# Patient Record
Sex: Female | Born: 1940 | Race: White | Hispanic: No | Smoking: Never smoker
Health system: Southern US, Community
[De-identification: ages and names within clinical notes are randomized; demographics above are authoritative.]

## PROBLEM LIST (undated history)

## (undated) DIAGNOSIS — E559 Vitamin D deficiency, unspecified: Secondary | ICD-10-CM

## (undated) DIAGNOSIS — R011 Cardiac murmur, unspecified: Secondary | ICD-10-CM

## (undated) DIAGNOSIS — E7800 Pure hypercholesterolemia, unspecified: Secondary | ICD-10-CM

## (undated) DIAGNOSIS — R233 Spontaneous ecchymoses: Secondary | ICD-10-CM

## (undated) DIAGNOSIS — N289 Disorder of kidney and ureter, unspecified: Secondary | ICD-10-CM

## (undated) DIAGNOSIS — C4490 Unspecified malignant neoplasm of skin, unspecified: Secondary | ICD-10-CM

## (undated) DIAGNOSIS — L309 Dermatitis, unspecified: Secondary | ICD-10-CM

## (undated) DIAGNOSIS — I10 Essential (primary) hypertension: Secondary | ICD-10-CM

## (undated) DIAGNOSIS — F419 Anxiety disorder, unspecified: Secondary | ICD-10-CM

## (undated) DIAGNOSIS — I889 Nonspecific lymphadenitis, unspecified: Secondary | ICD-10-CM

## (undated) HISTORY — DX: Nonspecific lymphadenitis, unspecified: I88.9

## (undated) HISTORY — DX: Essential (primary) hypertension: I10

## (undated) HISTORY — DX: Anxiety disorder, unspecified: F41.9

## (undated) HISTORY — DX: Unspecified malignant neoplasm of skin, unspecified: C44.90

## (undated) HISTORY — DX: Pure hypercholesterolemia, unspecified: E78.00

## (undated) HISTORY — DX: Spontaneous ecchymoses: R23.3

## (undated) HISTORY — PX: CARPAL TUNNEL RELEASE: SHX101

## (undated) HISTORY — PX: TONSILLECTOMY: SUR1361

## (undated) HISTORY — DX: Cardiac murmur, unspecified: R01.1

## (undated) HISTORY — DX: Dermatitis, unspecified: L30.9

## (undated) HISTORY — DX: Vitamin D deficiency, unspecified: E55.9

---

## 2010-01-19 ENCOUNTER — Ambulatory Visit: Payer: Self-pay | Admitting: Family Medicine

## 2015-04-12 ENCOUNTER — Encounter: Payer: Self-pay | Admitting: Cardiology

## 2015-04-12 ENCOUNTER — Encounter (INDEPENDENT_AMBULATORY_CARE_PROVIDER_SITE_OTHER): Payer: Self-pay

## 2015-04-12 ENCOUNTER — Encounter: Payer: Self-pay | Admitting: *Deleted

## 2015-04-12 ENCOUNTER — Ambulatory Visit (INDEPENDENT_AMBULATORY_CARE_PROVIDER_SITE_OTHER): Payer: Medicare HMO | Admitting: Cardiology

## 2015-04-12 VITALS — BP 158/100 | HR 83 | Ht 59.0 in | Wt 168.8 lb

## 2015-04-12 DIAGNOSIS — R9431 Abnormal electrocardiogram [ECG] [EKG]: Secondary | ICD-10-CM | POA: Diagnosis not present

## 2015-04-12 DIAGNOSIS — I10 Essential (primary) hypertension: Secondary | ICD-10-CM | POA: Diagnosis not present

## 2015-04-12 DIAGNOSIS — R0609 Other forms of dyspnea: Secondary | ICD-10-CM | POA: Diagnosis not present

## 2015-04-12 MED ORDER — CAPTOPRIL 50 MG PO TABS: ORAL_TABLET | ORAL | Status: DC

## 2015-04-12 NOTE — Patient Instructions (Signed)
Your physician has recommended you make the following change in your medication:  1) Increase captopril to 50 mg take 1 & 1/2 tablets (75 mg) by mouth twice daily  Your physician has requested that you have an echocardiogram. Echocardiography is a painless test that uses sound waves to create images of your heart. It provides your doctor with information about the size and shape of your heart and how well your heart's chambers and valves are working. This procedure takes approximately one hour. There are no restrictions for this procedure.  Your physician recommends that you schedule a follow-up appointment in: 1 month with Dr. Ellyn Hack.

## 2015-04-12 NOTE — Progress Notes (Signed)
PATIENT: Linda Bradley MRN: [id number] DOB: [birth year] PCP: Lynnell Jude, MD  Clinic Note: Chief Complaint  Patient presents with  . other    Ref by PCP due to EKG changes c/o left arm numbness. Meds reviewed verbally with pt.    HPI: Linda Bradley is a 74 y.o. female with a  Past medical history notable for previous diagnosis heart murmur as well as hypertension and hypercholesterolemia/hyperlipidemia, prediabetes,and a family history of CHF but no noted CAD history she presents in referral from her primary physician following an episode of left arm numbness and an abnormal EKG.  She had been in usual state of health until the weekend before April 2 when she went to her primary physician's office with a complaint of an episode the previous day of left arm numbness with a splotchy discoloration of her hand this actually occurred shortly after waking up. It would not improve with warm compresses. She said that she didn't feel right afterwards. Apparently she had had a dose of medication just increased as she cannot room in true doses. She said the episode really began after that. She denied any chest tightness/pressure or dyspnea associated with it. The episode of left hand and arm numbness lasted roughly one hour. She thought it may be related to carpal tunnel.   The remainder of her Cardiovascular ROS : positive for - dyspnea on exertion and Describes getting short of breath climbing stairs or walking uphill. negative for - chest pain, edema, irregular heartbeat or orthopnea : At baseline, she does minimal exercise. He has noticed slow little dyspneic going up a hill or foot.  Past Medical History  Diagnosis Date  . Hypertension   . Hypercholesterolemia   . Dermatitis   . Anxiety disorder   . Lymphadenitis   . Spontaneous ecchymoses   . Vitamin D deficiency   . Heart murmur   . Skin cancer     Prior Cardiac Evaluation and Past Surgical History: Past Surgical History    Procedure Laterality Date  . Tonsillectomy    . Carpal tunnel release      [facility]    Allergies  Allergen Reactions  . Statins     Other reaction(s): Other (See Comments) Other Reaction: CNS DISORDER    Current Outpatient Prescriptions  Medication Sig Dispense Refill  . aspirin 81 MG tablet Take 81 mg by mouth 2 (two) times a week.     Marland Kitchen atenolol (TENORMIN) 50 MG tablet Take 50 mg by mouth 2 (two) times daily.    Marland Kitchen b complex vitamins tablet Take 2 tablets by mouth daily.    Marland Kitchen CALCIUM LACTATE PO Take 2 capsules by mouth daily.    . captopril (CAPOTEN) 50 MG tablet Take 1& 1/2 tablets (75 mg) by mouth twice daily 270 tablet 3  . cholecalciferol (VITAMIN D) 1000 UNITS tablet Take 1,000 Units by mouth daily.    . clonazePAM (KLONOPIN) 0.5 MG tablet Take 0.5 mg by mouth 3 (three) times daily as needed for anxiety.    . folic acid (FOLVITE) 1 MG tablet Take 1 mg by mouth daily.    . hydrochlorothiazide (HYDRODIURIL) 25 MG tablet Take 25 mg by mouth daily.     . Multiple Vitamin (MULTIVITAMIN) capsule Take 1 capsule by mouth daily.    . Omega-3 Fatty Acids (FISH OIL) 1200 MG CAPS Take by mouth daily.     No current facility-administered medications for this visit.    History   Social History  Narrative    family history includes Heart disease in her father; Heart failure in her father.our attention was thought to be the cause for his father's CHF.  ROS: A comprehensive Review of Systems - was performed Review of Systems  Respiratory: Negative for cough, sputum production and shortness of breath.   Cardiovascular: Negative for palpitations and claudication.       Per history of present illness  Genitourinary: Positive for urgency and frequency.  Musculoskeletal: Positive for myalgias, joint pain and neck pain. Negative for falls.  Neurological: Positive for dizziness (Occasional positional.) and headaches.       Left arm/hand numbness  Psychiatric/Behavioral: The patient is  nervous/anxious.     Wt Readings from Last 3 Encounters:  04/12/15 168 lb 12 oz (76.544 kg)    PHYSICAL EXAM BP 158/100 mmHg  Pulse 83  Ht 4\' 11"  (1.499 m)  Wt 168 lb 12 oz (76.544 kg)  BMI 34.06 kg/m2 General appearance: alert, cooperative, appears stated age, no distress and Very anxious mood and affect. Very apprehensive. Neck: no adenopathy, no carotid bruit, no JVD, supple, symmetrical, trachea midline and thyroid not enlarged, symmetric, no tenderness/mass/nodules Lungs: clear to auscultation bilaterally, normal percussion bilaterally and Normal labor, good air movement Heart: normal apical impulse and RRR, normal S1 and S2 with a soft S4 and systolic murmur. Difficult to correlate with radiation and base point of the murmur.; Nondisplaced PMI. Abdomen: soft, non-tender; bowel sounds normal; no masses,  no organomegaly Extremities: extremities normal, atraumatic, no cyanosis or edema and no ulcers, gangrene or trophic changes Pulses: 2+ and symmetric Skin: Skin color, texture, turgor normal. No rashes or lesions Neurologic: Mental status: Alert, oriented, thought content appropriate, Mild numbness/tingling in bilateral hands. Cranial nerves: normal  HEENT: [place]/AT, EOMI, MMM, anicteric sclera   Adult ECG Report  Rate: 83 ;  Rhythm: normal sinus rhythm ; normal axis, intervals, voltage & duration.  Poor R wave progression.  Narrative Interpretation: relatively normal EKG  Recent Labs: none available  ASSESSMENT / PLAN: 74 year old woman with hypertension is long-standing, treated with captopril now who presents with hypertension and abnormal EKG following an episode of left hand left arm numbness. My examination would be that a neurologic evaluation is better suited for her arm numbness.credit her symptoms really seem to be noncardiac in etiology, however with her family history and her hypertension today it is not unreasonable to assess for any 5 hypertensive heart disease in the  setting of evidence for an soft murmur.  Problem List Items Addressed This Visit    Abnormal EKG    Abnormal EKG plus hypertension with some changes suggestive of possible MI on the EKG that was sent for PCP. Would like to evaluate overall cardiac function systolic and diastolic in the prerenal wall motion abnormality to suggest any potential ischemic etiology - a prior MI would likely show hypokinesis.      Relevant Orders   EKG 12-Lead (Completed)   2D Echocardiogram without contrast   Dyspnea on exertion - Primary    She is not very active, exercising woman. With that unusual episode of left arm numbness left shoulder discomfort that is concerning for CAD as well as exertional dyspnea, we discussed the best way to evaluate for ischemia. She looks like she may be a good GXT candidate however she is not sure based on some knee pain and arthritis symptoms if she would be able to walk on a treadmill in the next couple months.  Plan: Continue hypertension mitral  and check echocardiogram to assess her structural changes based on S4 and no murmur. Depending on those findings would consider possible ischemic evaluation although she does not have any significant anginal type symptoms. The EKG concerned was suggestive of possible old MI, an echo showed preserved wall motion abnormalities. A stress test could show something not seen on echo, however we wouldn't have to do it imaging protocol and she may not be able to the terminal portion therefore like to hold off.      Relevant Orders   2D Echocardiogram without contrast   Essential hypertension (Chronic)    Not well controlled today. She would like to ask for assistance in managing her blood pressure. Apparently her father had long-standing high blood pressure thought to be related to hypertension and he was well controlled with captopril. She is now on 75 mg in the morning and 50 mg in the evening of April. I will simply increase her to 75 twice a  day. Would then want to consider probably adding a calcium channel blocker such as amlodipine versus changing from atenolol to carvedilol depending on what the results of the stress test showed      Relevant Medications   captopril (CAPOTEN) 50 MG tablet     Although her symptoms are somewhat straightforward, I have personally reviewed the patient's outpatient PCP clinic note as well as EKG. Reviewing her previous chart and EKGs plus the clinic visit was at least 50 minutes.   Followup: within the next 2 months  DAVID W. Ellyn Hack, M.D., M.S. Interventional Cardiolgy [facility]

## 2015-04-14 ENCOUNTER — Encounter: Payer: Self-pay | Admitting: Cardiology

## 2015-04-14 DIAGNOSIS — R9431 Abnormal electrocardiogram [ECG] [EKG]: Secondary | ICD-10-CM | POA: Insufficient documentation

## 2015-04-14 DIAGNOSIS — I10 Essential (primary) hypertension: Secondary | ICD-10-CM | POA: Insufficient documentation

## 2015-04-14 DIAGNOSIS — R0609 Other forms of dyspnea: Principal | ICD-10-CM

## 2015-04-14 NOTE — Assessment & Plan Note (Signed)
She is not very active, exercising woman. With that unusual episode of left arm numbness left shoulder discomfort that is concerning for CAD as well as exertional dyspnea, we discussed the best way to evaluate for ischemia. She looks like she may be a good GXT candidate however she is not sure based on some knee pain and arthritis symptoms if she would be able to walk on a treadmill in the next couple months.  Plan: Continue hypertension mitral and check echocardiogram to assess her structural changes based on S4 and no murmur. Depending on those findings would consider possible ischemic evaluation although she does not have any significant anginal type symptoms. The EKG concerned was suggestive of possible old MI, an echo showed preserved wall motion abnormalities. A stress test could show something not seen on echo, however we wouldn't have to do it imaging protocol and she may not be able to the terminal portion therefore like to hold off.

## 2015-04-14 NOTE — Assessment & Plan Note (Signed)
Not well controlled today. She would like to ask for assistance in managing her blood pressure. Apparently her father had long-standing high blood pressure thought to be related to hypertension and he was well controlled with captopril. She is now on 75 mg in the morning and 50 mg in the evening of April. I will simply increase her to 75 twice a day. Would then want to consider probably adding a calcium channel blocker such as amlodipine versus changing from atenolol to carvedilol depending on what the results of the stress test showed

## 2015-04-14 NOTE — Assessment & Plan Note (Signed)
Abnormal EKG plus hypertension with some changes suggestive of possible MI on the EKG that was sent for PCP. Would like to evaluate overall cardiac function systolic and diastolic in the prerenal wall motion abnormality to suggest any potential ischemic etiology - a prior MI would likely show hypokinesis.

## 2015-04-24 ENCOUNTER — Other Ambulatory Visit: Payer: Self-pay | Admitting: Cardiology

## 2015-04-24 DIAGNOSIS — R0602 Shortness of breath: Secondary | ICD-10-CM

## 2015-05-01 ENCOUNTER — Other Ambulatory Visit: Payer: Self-pay

## 2015-05-01 ENCOUNTER — Ambulatory Visit (INDEPENDENT_AMBULATORY_CARE_PROVIDER_SITE_OTHER): Payer: Medicare HMO

## 2015-05-01 DIAGNOSIS — R0602 Shortness of breath: Secondary | ICD-10-CM | POA: Diagnosis not present

## 2015-05-01 HISTORY — PX: TRANSTHORACIC ECHOCARDIOGRAM: SHX275

## 2015-05-08 ENCOUNTER — Telehealth: Payer: Self-pay | Admitting: Cardiology

## 2015-05-08 NOTE — Telephone Encounter (Signed)
S/w pt and pt son, Shanon Brow, to review Echo results. Confirmed f/u appt 6/1. Pt had no further questions Will fax results to PCP

## 2015-05-08 NOTE — Telephone Encounter (Signed)
Notified pt that I would forward to Dr. Ellyn Hack for interpretation.

## 2015-05-08 NOTE — Telephone Encounter (Signed)
Patient wants  Test results from last week.

## 2015-05-08 NOTE — Telephone Encounter (Signed)
Forwarded to Dr. Ellyn Hack

## 2015-05-08 NOTE — Telephone Encounter (Signed)
See - result note.  [place]

## 2015-05-08 NOTE — Telephone Encounter (Signed)
See result note.  

## 2015-05-24 ENCOUNTER — Ambulatory Visit (INDEPENDENT_AMBULATORY_CARE_PROVIDER_SITE_OTHER): Payer: Medicare HMO | Admitting: Cardiology

## 2015-05-24 ENCOUNTER — Encounter: Payer: Self-pay | Admitting: Cardiology

## 2015-05-24 VITALS — BP 148/78 | HR 81 | Ht 59.0 in | Wt 166.8 lb

## 2015-05-24 DIAGNOSIS — R0609 Other forms of dyspnea: Secondary | ICD-10-CM | POA: Diagnosis not present

## 2015-05-24 DIAGNOSIS — R9431 Abnormal electrocardiogram [ECG] [EKG]: Secondary | ICD-10-CM | POA: Diagnosis not present

## 2015-05-24 DIAGNOSIS — I10 Essential (primary) hypertension: Secondary | ICD-10-CM

## 2015-05-24 DIAGNOSIS — R238 Other skin changes: Secondary | ICD-10-CM

## 2015-05-24 DIAGNOSIS — I350 Nonrheumatic aortic (valve) stenosis: Secondary | ICD-10-CM

## 2015-05-24 DIAGNOSIS — L209 Atopic dermatitis, unspecified: Secondary | ICD-10-CM

## 2015-05-24 NOTE — Progress Notes (Signed)
PATIENT: Linda Bradley MRN: [id number] DOB: 07-Jul-1941 PCP: Lynnell Jude, MD  Clinic Note: Chief Complaint  Patient presents with  . other    1 month f/u echo no complaints. Meds reveiwed verbally with pt.  Echo results: Normal Cardiac Pump function: EF 60-65%. Abnormal relaxation - probably normal for age. Mild Aortic Stenosis - explains murmur - but not enough to make her symptomatic.  Does not explain CP. It could explain some DOE   HPI: Linda Bradley is a 74 y.o. female with a  Past medical history notable for previous diagnosis heart murmur as well as hypertension and hypercholesterolemia/hyperlipidemia, prediabetes,and a family history of CHF but no noted CAD history she presents in referral from her primary physician following an episode of left arm numbness and an abnormal EKG.  In order to evaluate her EKG and the history of murmur she had an echocardiogram with the results noted above. There was mild aortic stenosis which explains the murmur. Is also some evidence of diastolic dysfunction which could explain exertional dyspnea.  Not had any further episodes of left arm numbness or weakness. No splotchy discoloration. No further episodes of resting or exertional chest tightness/pressure or dyspnea.   The remainder of her Cardiovascular ROS : positive for - dyspnea on exertion  and Describes getting short of breath climbing stairs or walking uphill rapidly. negative for - chest pain, edema, irregular heartbeat or orthopnea, palpitations, PND, syncope/near-syncope, TIA/amaurosis fugax. : At baseline, she does minimal exercise.  Past Medical History  Diagnosis Date  . Hypertension   . Hypercholesterolemia   . Dermatitis   . Anxiety disorder   . Lymphadenitis   . Spontaneous ecchymoses   . Vitamin D deficiency   . Heart murmur   . Skin cancer     Prior Cardiac Evaluation and Past Surgical History: Past Surgical History  Procedure Laterality Date  .  Tonsillectomy    . Carpal tunnel release      [facility]    Allergies  Allergen Reactions  . Statins     Other reaction(s): Other (See Comments) Other Reaction: CNS DISORDER    Current Outpatient Prescriptions  Medication Sig Dispense Refill  . aspirin 81 MG tablet Take 81 mg by mouth 2 (two) times a week.     Marland Kitchen atenolol (TENORMIN) 50 MG tablet Take 50 mg by mouth 2 (two) times daily.    Marland Kitchen b complex vitamins tablet Take 2 tablets by mouth daily.    Marland Kitchen CALCIUM LACTATE PO Take 2 capsules by mouth daily.    . captopril (CAPOTEN) 50 MG tablet Take 1& 1/2 tablets (75 mg) by mouth twice daily 270 tablet 3  . cholecalciferol (VITAMIN D) 1000 UNITS tablet Take 1,000 Units by mouth daily.    . clonazePAM (KLONOPIN) 0.5 MG tablet Take 0.5 mg by mouth 3 (three) times daily as needed for anxiety.    . folic acid (FOLVITE) 1 MG tablet Take 1 mg by mouth daily.    . hydrochlorothiazide (HYDRODIURIL) 25 MG tablet Take 25 mg by mouth daily.     . Multiple Vitamin (MULTIVITAMIN) capsule Take 1 capsule by mouth daily.    . Omega-3 Fatty Acids (FISH OIL) 1200 MG CAPS Take by mouth daily.     No current facility-administered medications for this visit.    History   Social History Narrative    family history includes Heart disease in her father; Heart failure in her father.our attention was thought to be the cause for  his father's CHF.  ROS: A comprehensive Review of Systems - was performed Review of Systems  Constitutional: Negative for malaise/fatigue.  Cardiovascular:       Per history of present illness  Gastrointestinal: Negative for blood in stool and melena.  Genitourinary: Positive for urgency and frequency. Negative for hematuria.  Psychiatric/Behavioral: Negative for depression.  All other systems reviewed and are negative.   Wt Readings from Last 3 Encounters:  05/24/15 75.637 kg (166 lb 12 oz)  04/12/15 76.544 kg (168 lb 12 oz)   PHYSICAL EXAM BP 148/78 mmHg  Pulse 81  Ht 4\' 11"   (1.499 m)  Wt 75.637 kg (166 lb 12 oz)  BMI 33.66 kg/m2 General appearance: alert, cooperative, appears stated age, no distress and Very anxious mood and affect. Very apprehensive. Neck: no adenopathy, no carotid bruit, no JVD, supple, symmetrical, trachea midline and thyroid not enlarged, symmetric, no tenderness/mass/nodules Lungs: clear to auscultation bilaterally, normal percussion bilaterally and Normal labor, good air movement Heart: normal apical impulse and RRR, normal S1 and S2 with a soft S4 and systolic murmur. Difficult to correlate with radiation and base point of the murmur.; Nondisplaced PMI. Abdomen: soft, non-tender; bowel sounds normal; no masses,  no organomegaly Extremities: extremities normal, atraumatic, no cyanosis or edema and no ulcers, gangrene or trophic changes Pulses: 2+ and symmetric Skin: Skin color, texture, turgor normal. No rashes or lesions Neurologic: Mental status: Alert, oriented, thought content appropriate, Mild numbness/tingling in bilateral hands. Cranial nerves: normal  HEENT: [place]/AT, EOMI, MMM, anicteric sclera   Adult ECG Report - not performed   Recent Labs: none available  ASSESSMENT / PLAN: 74 year old woman with hypertension is long-standing, treated with captopril now who presents with hypertension and abnormal EKG following who has an echocardiogram now shows mild aortic stenosis and diastolic dysfunction but no wall motion modalities.   She's had no further symptoms of concern. I don't think that a stress test is warranted at this time.  Problem List Items Addressed This Visit    Abnormal EKG    Relatively normal echocardiogram. No wall motion modalities to suggest MI. I think this is just her baseline EKG. Not grossly abnormal. In the absence of symptoms I would not proceed with ischemic evaluation      Aortic stenosis, mild (Chronic)    With the aortic stenosis we will need to monitor this with an echocardiogram in one year. This  will be to ensure there is no progressions in that year. I'll see her back after that echocardiogram.  Aortic stenosis is often a similar process is seen with coronary atherosclerosis. She will need more risk factor modification. Plan will be to check a fasting lipid panel in order to determine whether or not she needs to be on a statin.      Dyspnea on exertion    Likely related to deconditioning. No evidence of significant LV dysfunction. She could have some diastolic dysfunction related dyspnea which is related to hypertension and mild aortic stenosis.      Essential hypertension (Chronic)    Borderline elevated pressures today. Would consider potentially increasing beta blocker to 75 twice a day versus adding another medication.       Other Visit Diagnoses    Hyperlineal palms    -  Primary    Relevant Orders    Hepatic function panel    Lipid Profile       Followup: 12 months  DAVID W. Ellyn Hack, M.D., M.S. Interventional Cardiolgy [facility]

## 2015-05-24 NOTE — Patient Instructions (Signed)
Medication Instructions:  Your physician recommends that you continue on your current medications as directed. Please refer to the Current Medication list given to you today.   Labwork: Your physician recommends that you return for a FASTING lipid and liver profile. Nothing to eat or drink after midnight the evening before your labs.   Testing/Procedures: None  Follow-Up: Your physician wants you to follow-up in: one year with Dr. Ellyn Hack.  You will receive a reminder letter in the mail two months in advance. If you don't receive a letter, please call our office to schedule the follow-up appointment.   Any Other Special Instructions Will Be Listed Below (If Applicable).

## 2015-05-26 DIAGNOSIS — I350 Nonrheumatic aortic (valve) stenosis: Secondary | ICD-10-CM | POA: Insufficient documentation

## 2015-05-26 NOTE — Assessment & Plan Note (Signed)
Relatively normal echocardiogram. No wall motion modalities to suggest MI. I think this is just her baseline EKG. Not grossly abnormal. In the absence of symptoms I would not proceed with ischemic evaluation

## 2015-05-26 NOTE — Assessment & Plan Note (Signed)
Borderline elevated pressures today. Would consider potentially increasing beta blocker to 75 twice a day versus adding another medication.

## 2015-05-26 NOTE — Assessment & Plan Note (Signed)
With the aortic stenosis we will need to monitor this with an echocardiogram in one year. This will be to ensure there is no progressions in that year. I'll see her back after that echocardiogram.  Aortic stenosis is often a similar process is seen with coronary atherosclerosis. She will need more risk factor modification. Plan will be to check a fasting lipid panel in order to determine whether or not she needs to be on a statin.

## 2015-05-26 NOTE — Assessment & Plan Note (Signed)
Likely related to deconditioning. No evidence of significant LV dysfunction. She could have some diastolic dysfunction related dyspnea which is related to hypertension and mild aortic stenosis.

## 2015-12-28 ENCOUNTER — Telehealth: Payer: Self-pay | Admitting: *Deleted

## 2015-12-28 NOTE — Telephone Encounter (Signed)
Pt c/o BP issue: STAT if pt c/o blurred vision, one-sided weakness or slurred speech  1. What are your last 5 BP readings?  181/98 took half more of and it helped more.   2. Are you having any other symptoms (ex. Dizziness, headache, blurred vision, passed out)?  Did not say  3. What is your BP issue? BP is jumping around a lot.   Made patient an appointment to see Dr Ellyn Hack, refused to see anyone else but him and did not even want me to take a nurse triage call. She only wanted to talk to the doctor. States she went to PCP and they increased her medication a bit today and it helped but would only like to come in and get the rest taken care of Please advise.

## 2015-12-29 NOTE — Telephone Encounter (Signed)
S/w pt who reports elevated BP yesterday of 181/98. She called our office and asked for appt to see Dr. Ellyn Hack. As he did not have an opening until February, she called Dr. Clemmie Krill, her PCP. Per pt, she was instructed to take and extra 1/2 captopril, recheck BP and call PCP back in two hours. Pt reports BP improved to 158/91. She has a 10:15 appt today w/PCP.  We reviewed current medications. Pt will call and update [place] if Dr. Clemmie Krill adjusts any BP medications.

## 2015-12-29 NOTE — Telephone Encounter (Signed)
Dr. Clemmie Krill  Changed Atenolol to 75 mg x 2 daily .  All the other meds stayed the same.

## 2016-01-22 ENCOUNTER — Other Ambulatory Visit: Payer: Self-pay

## 2016-01-22 NOTE — Telephone Encounter (Signed)
Error

## 2016-02-14 ENCOUNTER — Other Ambulatory Visit
Admission: RE | Admit: 2016-02-14 | Discharge: 2016-02-14 | Disposition: A | Discharge status: Home / Self Care | Payer: Medicare HMO | Source: Ambulatory Visit | Attending: Cardiology | Admitting: Cardiology

## 2016-02-14 ENCOUNTER — Encounter: Payer: Self-pay | Admitting: Cardiology

## 2016-02-14 ENCOUNTER — Ambulatory Visit (INDEPENDENT_AMBULATORY_CARE_PROVIDER_SITE_OTHER): Payer: Medicare HMO | Admitting: Cardiology

## 2016-02-14 VITALS — BP 176/90 | HR 75 | Ht 59.0 in | Wt 167.1 lb

## 2016-02-14 DIAGNOSIS — I10 Essential (primary) hypertension: Secondary | ICD-10-CM | POA: Diagnosis not present

## 2016-02-14 DIAGNOSIS — R0609 Other forms of dyspnea: Secondary | ICD-10-CM

## 2016-02-14 DIAGNOSIS — I350 Nonrheumatic aortic (valve) stenosis: Secondary | ICD-10-CM

## 2016-02-14 LAB — COMPREHENSIVE METABOLIC PANEL
ALBUMIN: 4.6 g/dL (ref 3.5–5.0)
ALT: 39 U/L (ref 14–54)
ANION GAP: 8 (ref 5–15)
AST: 37 U/L (ref 15–41)
Alkaline Phosphatase: 61 U/L (ref 38–126)
BUN: 15 mg/dL (ref 6–20)
CALCIUM: 10 mg/dL (ref 8.9–10.3)
CO2: 33 mmol/L — ABNORMAL HIGH (ref 22–32)
CREATININE: 1.13 mg/dL — AB (ref 0.44–1.00)
Chloride: 98 mmol/L — ABNORMAL LOW (ref 101–111)
GFR calc Af Amer: 54 mL/min — ABNORMAL LOW (ref >60)
GFR calc non Af Amer: 47 mL/min — ABNORMAL LOW (ref >60)
GLUCOSE: 125 mg/dL — AB (ref 65–99)
POTASSIUM: 4.4 mmol/L (ref 3.5–5.1)
SODIUM: 139 mmol/L (ref 135–145)
TOTAL PROTEIN: 7.4 g/dL (ref 6.5–8.1)
Total Bilirubin: 1.2 mg/dL (ref 0.3–1.2)

## 2016-02-14 MED ORDER — CHLORTHALIDONE 50 MG PO TABS: 50.0000 mg | ORAL_TABLET | Freq: Every day | ORAL | Status: DC

## 2016-02-14 NOTE — Progress Notes (Signed)
PCP: BLISS, Lynnell Jude, MD  Clinic Note: Chief Complaint  Patient presents with  . Hypertension    NO COMPLAINTS    HPI: Linda Bradley is a 75 y.o. female with a PMH below who presents today for ~6-8 month f/u for HTN, HLD, pre-DM & Heart Murmur (mild AS). Initial consult was for an episodes of L arm numbness & abnormal EKG along with DOE.  AZORA PARYS was last seen in June 2016 for f/u of Echo demonstrating EF 60-65% with mild AS & Gr 1 Diastolic Dysfunction. DOE thought due to deconditioning with some Diastolic Dysfunction component.  Recent Hospitalizations: none  Studies Reviewed: none  Interval History: [name] presents today with concerns of rising blood pressure. Apparently about a month ago she had an episode of blood pressure being elevated.   ~ 1 month ago BP was quite high - 180s/90s - felt shaky all over & flushed, uneasy/queasy with HA. No blurred vision or dizziness - went to PCP => increased Atenolol to 75 mg bid & Captopril 100 mg BID. Was doing well until today - feels flushed.==> did eat barbecue sandwich for lunch.  Today she feels a little flushed & a bit shaky. She denies a headache or blurred vision, but does feel a bit stressed out. She is concerned that her blood pressures again elevated. She says that at home her pressures have been in the 1:30 to 1 50 mmHg range systolic. She is a bit concerned her pressure is higher today. Otherwise from a cardiac standpoint she is not really having any other symptoms. No TIA or amaurosis fugax symptoms. She does have headaches when her blood pressure is high but is not currently having headache despite elevated pressures.  No chest pain or shortness of breath with rest or exertion.  No PND, orthopnea or edema.  No palpitations, weakness or syncope/near syncope. No TIA/amaurosis fugax symptoms. No claudication.  ROS: A comprehensive was performed. Review of Systems  Constitutional: Negative for malaise/fatigue.    Eyes: Negative for blurred vision.  Gastrointestinal: Negative for blood in stool and melena.  Genitourinary: Negative for hematuria.  Neurological: Positive for dizziness (When pressure is high) and headaches (When her blood pressure is elevated). Negative for weakness.  Psychiatric/Behavioral: The patient is nervous/anxious (Anxious about her pressure being high).   All other systems reviewed and are negative.   Past Medical History  Diagnosis Date  . Hypertension   . Hypercholesterolemia   . Dermatitis   . Anxiety disorder   . Lymphadenitis   . Spontaneous ecchymoses   . Vitamin D deficiency   . Heart murmur   . Skin cancer     Past Surgical History  Procedure Laterality Date  . Tonsillectomy    . Carpal tunnel release      [facility]  . Transthoracic echocardiogram  05/01/2015     EF 60-65%. Abnormal relaxation. Mild aortic stenosis (peak gradient 19 mmHg).   Prior to Admission medications   Medication Sig Start Date End Date Taking? Authorizing Provider  aspirin 81 MG tablet Take 81 mg by mouth 2 (two) times a week.    Yes Historical Provider, MD  atenolol (TENORMIN) 50 MG tablet Take 75 mg by mouth 2 (two) times daily.    Yes Historical Provider, MD  b complex vitamins tablet Take 2 tablets by mouth daily.   Yes Historical Provider, MD  CALCIUM LACTATE PO Take 2 capsules by mouth daily.   Yes Historical Provider, MD  captopril (CAPOTEN) 50  MG tablet Take 1& 1/2 tablets (75 mg) by mouth twice daily Patient taking differently: Take 75 mg by mouth 2 (two) times daily. Take 1& 1/2 tablets (75 mg) by mouth twice daily 04/12/15  Yes Leonie Man, MD  cholecalciferol (VITAMIN D) 1000 UNITS tablet Take 1,000 Units by mouth daily.   Yes Historical Provider, MD  clonazePAM (KLONOPIN) 0.5 MG tablet Take 0.5 mg by mouth 3 (three) times daily as needed for anxiety.   Yes Historical Provider, MD  folic acid (FOLVITE) 1 MG tablet Take 1 mg by mouth daily.   Yes Historical Provider, MD   Multiple Vitamin (MULTIVITAMIN) capsule Take 1 capsule by mouth daily.   Yes Historical Provider, MD  Omega-3 Fatty Acids (FISH OIL) 1200 MG CAPS Take by mouth daily.   Yes Historical Provider, MD  HCTZ 25 mg daily - change to chlorthalidone today   Allergies  Allergen Reactions  . Statins     Other reaction(s): Other (See Comments) Other Reaction: CNS DISORDER    Social History   Social History  . Marital Status: Married    Spouse Name: N/A  . Number of Children: N/A  . Years of Education: N/A   Social History Main Topics  . Smoking status: Never Smoker   . Smokeless tobacco: None  . Alcohol Use: No  . Drug Use: No  . Sexual Activity: Not Asked   Other Topics Concern  . None   Social History Narrative   Family History  Problem Relation Age of Onset  . Heart disease Father   . Heart failure Father      Wt Readings from Last 3 Encounters:  02/14/16 167 lb 1.9 oz (75.805 kg)  05/24/15 166 lb 12 oz (75.637 kg)  04/12/15 168 lb 12 oz (76.544 kg)    PHYSICAL EXAM BP 176/90 mmHg  Pulse 75  Ht 4\' 11"  (1.499 m)  Wt 167 lb 1.9 oz (75.805 kg)  BMI 33.74 kg/m2 General appearance: alert, cooperative, appears stated age, no distress and Very anxious mood and affect.  HEENT: [place]/AT, EOMI, MMM, anicteric sclera Neck: no adenopathy, no carotid bruit, no JVD, supple, symmetrical, trachea midline and thyroid not enlarged, symmetric, no tenderness/mass/nodules Lungs: clear to auscultation bilaterally, normal percussion bilaterally and Normal labor, good air movement Heart: normal apical impulse and RRR, normal S1 and S2 with a soft S4 and systolic murmur; Nondisplaced PMI. Abdomen: soft, non-tender; bowel sounds normal; no masses, no organomegaly Extremities: extremities normal, atraumatic, no cyanosis or edema and no ulcers, gangrene or trophic changes Pulses: 2+ and symmetric Skin: Skin color, texture, turgor normal. No rashes or lesions Neurologic: Mental status: Alert,  oriented, thought content appropriate, Mild numbness/tingling in bilateral hands. Cranial nerves: normal     Adult ECG Report  Rate: 75 ;  Rhythm: normal sinus rhythm and Normal axis, intervals and durations;   Narrative Interpretation: Normal EKG. Stable   Other studies Reviewed: Additional studies/ records that were reviewed today include:  Recent Labs:    No results found for: CHOL, HDL, LDLCALC, LDLDIRECT, TRIG, CHOLHDL 12/27/2014 from PCP  Na+ 140, K+ 4.0, Cl- 94, HCO3- 28 , BUN 15, Cr 0.79, Glu 91, Ca2+ 9.7; AST 28, ALT 37 (mildly elevated); AlkP 74, Alb 4.7, TP 6.6, T Bili 1.0  CBC: W 10.3, H/H 15.3/45.2; Plt 343; hemoglobin A1c 6.3; TSH 2.8  No lipids  ASSESSMENT / PLAN: Problem List Items Addressed This Visit    Dyspnea on exertion    Relatively stable despite accelerated  hypertension. I suspect that she will have some diastolic dysfunction related to hypertension, but is also deconditioned.      Aortic stenosis, mild (Chronic)    Stable murmur. Can probably evaluate another year or 2. Lipids are being monitored by PCP as one of the risk factors for continued aggression of disease.      Relevant Medications   chlorthalidone (HYGROTON) 50 MG tablet   Accelerated essential hypertension - Primary    I'm a bit concerned with how high her pressures are today. I rechecked it it was actually higher than recorded. My plan for now will be to continue with her current regimen, with the exception of converting HCTZ to chlorthalidone 50 mg daily. I suspect we'll probably need to switch her from captopril to a more potent antihypertensive agent. We may even need to switch from atenolol to Bystolic or carvedilol. Next option would be to consider adding amlodipine.  For now we will recheck a BMP and have her return for blood pressure check next week the nursing check. Based on those findings, we can increase or adjust medications. I suspect that her hypertension today is directly  related to her barbecue sandwich. We talked about dietary discretion.      Relevant Medications   chlorthalidone (HYGROTON) 50 MG tablet      Current medicines are reviewed at length with the patient today. (+/- concerns) recurrent difficult control hypertension The following changes have been made:  TAKE an extra dose of HCTZ tonight then STOP taking HCTZ. Starting tomorrow, START takine chlorthalidone 50mg  daily  Return for blood pressure check with nurse in 1 week. Follow-up with Dr. Ellyn Hack in 2 months.  Studies Ordered:   Orders Placed This Encounter  Procedures  . Comprehensive metabolic panel  . EKG 12-Lead    A total of 25-30 minutes was spent in direct patient care. Greater than 50% was spent in counseling the patient is to the plan of care.   Leonie Man, M.D., M.S. Interventional Cardiologist   Pager # [phone] Phone # [phone] [address] [place], [place] [address]

## 2016-02-14 NOTE — Patient Instructions (Addendum)
Medication Instructions:  Your physician has recommended you make the following change in your medication:  TAKE an extra dose of HCTZ tonight then STOP taking HCTZ. Starting tomorrow, START takine chlorthalidone 50mg  daily   Labwork: CMET  Testing/Procedures: none  Follow-Up: Your physician recommends that you schedule a follow-up appointment in: 2 months with Dr. Ellyn Hack   Any Other Special Instructions Will Be Listed Below (If Applicable). Blood pressure check in one week     If you need a refill on your cardiac medications before your next appointment, please call your pharmacy.

## 2016-02-14 NOTE — Assessment & Plan Note (Signed)
I'm a bit concerned with how high her pressures are today. I rechecked it it was actually higher than recorded. My plan for now will be to continue with her current regimen, with the exception of converting HCTZ to chlorthalidone 50 mg daily. I suspect we'll probably need to switch her from captopril to a more potent antihypertensive agent. We may even need to switch from atenolol to Bystolic or carvedilol. Next option would be to consider adding amlodipine.  For now we will recheck a BMP and have her return for blood pressure check next week the nursing check. Based on those findings, we can increase or adjust medications. I suspect that her hypertension today is directly related to her barbecue sandwich. We talked about dietary discretion.

## 2016-02-14 NOTE — Assessment & Plan Note (Signed)
Stable murmur. Can probably evaluate another year or 2. Lipids are being monitored by PCP as one of the risk factors for continued aggression of disease.

## 2016-02-14 NOTE — Assessment & Plan Note (Signed)
Relatively stable despite accelerated hypertension. I suspect that she will have some diastolic dysfunction related to hypertension, but is also deconditioned.

## 2016-02-21 ENCOUNTER — Ambulatory Visit (INDEPENDENT_AMBULATORY_CARE_PROVIDER_SITE_OTHER): Payer: Medicare HMO

## 2016-02-21 VITALS — BP 140/74 | HR 78 | Resp 16 | Wt 165.1 lb

## 2016-02-21 DIAGNOSIS — I10 Essential (primary) hypertension: Secondary | ICD-10-CM

## 2016-02-21 NOTE — Patient Instructions (Signed)
1.) Reason for visit: BP check  2.) Name of MD requesting visit: Dr. Ellyn Hack        3) Pt has hx. At Feb 22 OV, BP elevated 176/90. Meds adjusted w/instructions for BP check in one week.  MD notes: "My plan for now will be to continue with her current regimen, with the exception of converting HCTZ to  chlorthalidone 50 mg daily. I suspect we'll probably need to switch her from captopril to a more potent antihypertensive agent. We may even need to switch from atenolol to Bystolic or carvedilol. Next option would be  to consider adding amlodipine. For now we will recheck a BMP and have her return for blood pressure check next week the nursing check. Based on those findings, we can increase or adjust medications.  3.) ROS related to problem: BP today 140/74.She took morning meds at 5am. States she takes captopril 100mg  BID. Med rec reflected 75mg  BID. Dosage corrected. Reviewed low sodium diet at length as pt is eager to follow this diet. 4)  Assessment and plan per MD:  Forward to MD to advise.

## 2016-02-27 ENCOUNTER — Encounter: Payer: Self-pay | Admitting: Cardiology

## 2016-03-13 ENCOUNTER — Ambulatory Visit
Admission: RE | Admit: 2016-03-13 | Discharge: 2016-03-13 | Disposition: A | Discharge status: Home / Self Care | Payer: Medicare HMO | Source: Ambulatory Visit | Attending: Family Medicine | Admitting: Family Medicine

## 2016-03-13 ENCOUNTER — Other Ambulatory Visit: Payer: Self-pay | Admitting: Family Medicine

## 2016-03-13 DIAGNOSIS — R05 Cough: Secondary | ICD-10-CM

## 2016-03-13 DIAGNOSIS — R059 Cough, unspecified: Secondary | ICD-10-CM

## 2016-03-14 ENCOUNTER — Encounter: Payer: Self-pay | Admitting: Emergency Medicine

## 2016-03-14 ENCOUNTER — Inpatient Hospital Stay
Admission: EM | Admit: 2016-03-14 | Discharge: 2016-03-17 | DRG: 641 | Disposition: A | Discharge status: Home / Self Care | Payer: Medicare HMO | Attending: Internal Medicine | Admitting: Internal Medicine

## 2016-03-14 DIAGNOSIS — Z23 Encounter for immunization: Secondary | ICD-10-CM | POA: Diagnosis not present

## 2016-03-14 DIAGNOSIS — E559 Vitamin D deficiency, unspecified: Secondary | ICD-10-CM | POA: Diagnosis present

## 2016-03-14 DIAGNOSIS — T502X5A Adverse effect of carbonic-anhydrase inhibitors, benzothiadiazides and other diuretics, initial encounter: Secondary | ICD-10-CM | POA: Diagnosis present

## 2016-03-14 DIAGNOSIS — Z85828 Personal history of other malignant neoplasm of skin: Secondary | ICD-10-CM

## 2016-03-14 DIAGNOSIS — E871 Hypo-osmolality and hyponatremia: Secondary | ICD-10-CM

## 2016-03-14 DIAGNOSIS — A084 Viral intestinal infection, unspecified: Secondary | ICD-10-CM | POA: Diagnosis present

## 2016-03-14 DIAGNOSIS — N390 Urinary tract infection, site not specified: Secondary | ICD-10-CM | POA: Diagnosis present

## 2016-03-14 DIAGNOSIS — Z79899 Other long term (current) drug therapy: Secondary | ICD-10-CM | POA: Diagnosis not present

## 2016-03-14 DIAGNOSIS — I5032 Chronic diastolic (congestive) heart failure: Secondary | ICD-10-CM | POA: Diagnosis present

## 2016-03-14 DIAGNOSIS — I110 Hypertensive heart disease with heart failure: Secondary | ICD-10-CM | POA: Diagnosis present

## 2016-03-14 DIAGNOSIS — F419 Anxiety disorder, unspecified: Secondary | ICD-10-CM | POA: Diagnosis present

## 2016-03-14 DIAGNOSIS — E876 Hypokalemia: Secondary | ICD-10-CM | POA: Diagnosis present

## 2016-03-14 DIAGNOSIS — Z888 Allergy status to other drugs, medicaments and biological substances status: Secondary | ICD-10-CM | POA: Diagnosis not present

## 2016-03-14 DIAGNOSIS — R112 Nausea with vomiting, unspecified: Secondary | ICD-10-CM

## 2016-03-14 DIAGNOSIS — B9620 Unspecified Escherichia coli [E. coli] as the cause of diseases classified elsewhere: Secondary | ICD-10-CM | POA: Diagnosis present

## 2016-03-14 DIAGNOSIS — R197 Diarrhea, unspecified: Secondary | ICD-10-CM

## 2016-03-14 DIAGNOSIS — Z7982 Long term (current) use of aspirin: Secondary | ICD-10-CM

## 2016-03-14 DIAGNOSIS — R011 Cardiac murmur, unspecified: Secondary | ICD-10-CM | POA: Diagnosis present

## 2016-03-14 DIAGNOSIS — E7800 Pure hypercholesterolemia, unspecified: Secondary | ICD-10-CM | POA: Diagnosis present

## 2016-03-14 LAB — URINALYSIS COMPLETE WITH MICROSCOPIC (ARMC ONLY)
BILIRUBIN URINE: NEGATIVE
GLUCOSE, UA: 50 mg/dL — AB
HGB URINE DIPSTICK: NEGATIVE
NITRITE: NEGATIVE
PH: 7 (ref 5.0–8.0)
Protein, ur: NEGATIVE mg/dL
Specific Gravity, Urine: 1.013 (ref 1.005–1.030)

## 2016-03-14 LAB — COMPREHENSIVE METABOLIC PANEL
ALT: 33 U/L (ref 14–54)
ANION GAP: 14 (ref 5–15)
AST: 40 U/L (ref 15–41)
Albumin: 3.8 g/dL (ref 3.5–5.0)
Alkaline Phosphatase: 65 U/L (ref 38–126)
BILIRUBIN TOTAL: 2 mg/dL — AB (ref 0.3–1.2)
BUN: 12 mg/dL (ref 6–20)
CHLORIDE: 69 mmol/L — AB (ref 101–111)
CO2: 26 mmol/L (ref 22–32)
Calcium: 7.9 mg/dL — ABNORMAL LOW (ref 8.9–10.3)
Creatinine, Ser: 0.48 mg/dL (ref 0.44–1.00)
Glucose, Bld: 162 mg/dL — ABNORMAL HIGH (ref 65–99)
POTASSIUM: 2.1 mmol/L — AB (ref 3.5–5.1)
Sodium: 109 mmol/L — CL (ref 135–145)
TOTAL PROTEIN: 6.3 g/dL — AB (ref 6.5–8.1)

## 2016-03-14 LAB — CBC
HEMATOCRIT: 44.8 % (ref 35.0–47.0)
Hemoglobin: 15.1 g/dL (ref 12.0–16.0)
MCH: 30.4 pg (ref 26.0–34.0)
MCHC: 33.7 g/dL (ref 32.0–36.0)
MCV: 80.9 fL (ref 80.0–100.0)
Platelets: 146 10*3/uL — ABNORMAL LOW (ref 150–440)
RBC: 4.96 MIL/uL (ref 3.80–5.20)
RDW: 12.2 % (ref 11.5–14.5)
WBC: 7.2 10*3/uL (ref 3.6–11.0)

## 2016-03-14 LAB — CBC WITH DIFFERENTIAL/PLATELET
BASOS ABS: 0 10*3/uL (ref 0–0.1)
Basophils Relative: 0 %
Eosinophils Absolute: 0 10*3/uL (ref 0–0.7)
HEMATOCRIT: 47.2 % — AB (ref 35.0–47.0)
Hemoglobin: 15.5 g/dL (ref 12.0–16.0)
Lymphs Abs: 1.1 10*3/uL (ref 1.0–3.6)
MCH: 30.3 pg (ref 26.0–34.0)
MCHC: 32.8 g/dL (ref 32.0–36.0)
MCV: 80.5 fL (ref 80.0–100.0)
MONO ABS: 1 10*3/uL — AB (ref 0.2–0.9)
NEUTROS ABS: 4.9 10*3/uL (ref 1.4–6.5)
Neutrophils Relative %: 70 %
PLATELETS: 184 10*3/uL (ref 150–440)
RBC: 5.12 MIL/uL (ref 3.80–5.20)
RDW: 12.2 % (ref 11.5–14.5)
WBC: 7 10*3/uL (ref 3.6–11.0)

## 2016-03-14 LAB — CREATININE, SERUM
Creatinine, Ser: 0.44 mg/dL (ref 0.44–1.00)
GFR calc non Af Amer: >60 mL/min (ref >60)

## 2016-03-14 LAB — BASIC METABOLIC PANEL
Anion gap: 11 (ref 5–15)
BUN: 9 mg/dL (ref 6–20)
CHLORIDE: 79 mmol/L — AB (ref 101–111)
CO2: 23 mmol/L (ref 22–32)
CREATININE: 0.39 mg/dL — AB (ref 0.44–1.00)
Calcium: 7.8 mg/dL — ABNORMAL LOW (ref 8.9–10.3)
GFR calc Af Amer: >60 mL/min (ref >60)
GFR calc non Af Amer: >60 mL/min (ref >60)
Glucose, Bld: 114 mg/dL — ABNORMAL HIGH (ref 65–99)
Potassium: 2.8 mmol/L — CL (ref 3.5–5.1)
SODIUM: 113 mmol/L — AB (ref 135–145)

## 2016-03-14 LAB — MAGNESIUM: Magnesium: 1.4 mg/dL — ABNORMAL LOW (ref 1.7–2.4)

## 2016-03-14 LAB — SODIUM, URINE, RANDOM: Sodium, Ur: 77 mmol/L

## 2016-03-14 LAB — TSH: TSH: 1.384 u[IU]/mL (ref 0.350–4.500)

## 2016-03-14 LAB — POTASSIUM: POTASSIUM: 3.4 mmol/L — AB (ref 3.5–5.1)

## 2016-03-14 LAB — OSMOLALITY, URINE: Osmolality, Ur: 481 mOsm/kg (ref 300–900)

## 2016-03-14 LAB — LIPASE, BLOOD: LIPASE: 19 U/L (ref 11–51)

## 2016-03-14 MED ORDER — ADULT MULTIVITAMIN W/MINERALS CH
1.0000 | ORAL_TABLET | Freq: Every day | ORAL | Status: DC
Start: 2016-03-14 — End: 2016-03-17
  Administered 2016-03-14 – 2016-03-16 (×3): 1 via ORAL
  Filled 2016-03-14 (×4): qty 1

## 2016-03-14 MED ORDER — MAGNESIUM SULFATE 2 GM/50ML IV SOLN
2.0000 g | Freq: Once | INTRAVENOUS | Status: AC
  Administered 2016-03-14: 2 g via INTRAVENOUS
  Filled 2016-03-14: qty 50

## 2016-03-14 MED ORDER — B COMPLEX PO TABS: 2.0000 | ORAL_TABLET | Freq: Every day | ORAL | Status: DC

## 2016-03-14 MED ORDER — VITAMIN D 1000 UNITS PO TABS
1000.0000 [IU] | ORAL_TABLET | Freq: Every day | ORAL | Status: DC
  Administered 2016-03-14 – 2016-03-16 (×3): 1000 [IU] via ORAL
  Filled 2016-03-14 (×4): qty 1

## 2016-03-14 MED ORDER — ASPIRIN EC 81 MG PO TBEC
81.0000 mg | DELAYED_RELEASE_TABLET | ORAL | Status: DC
Start: 2016-03-14 — End: 2016-03-17
  Administered 2016-03-14: 81 mg via ORAL
  Filled 2016-03-14 (×2): qty 1

## 2016-03-14 MED ORDER — CAPTOPRIL 25 MG PO TABS
100.0000 mg | ORAL_TABLET | Freq: Two times a day (BID) | ORAL | Status: DC
  Administered 2016-03-14 – 2016-03-17 (×7): 100 mg via ORAL
  Filled 2016-03-14 (×3): qty 4
  Filled 2016-03-14: qty 1
  Filled 2016-03-14 (×2): qty 4
  Filled 2016-03-14: qty 1
  Filled 2016-03-14 (×4): qty 4

## 2016-03-14 MED ORDER — MAGNESIUM SULFATE 4 GM/100ML IV SOLN
4.0000 g | Freq: Once | INTRAVENOUS | Status: AC
  Administered 2016-03-14: 4 g via INTRAVENOUS
  Filled 2016-03-14: qty 100

## 2016-03-14 MED ORDER — PNEUMOCOCCAL VAC POLYVALENT 25 MCG/0.5ML IJ INJ
0.5000 mL | INJECTION | INTRAMUSCULAR | Status: AC
  Administered 2016-03-15: 0.5 mL via INTRAMUSCULAR
  Filled 2016-03-14: qty 0.5

## 2016-03-14 MED ORDER — HEPARIN SODIUM (PORCINE) 5000 UNIT/ML IJ SOLN
5000.0000 [IU] | Freq: Three times a day (TID) | INTRAMUSCULAR | Status: DC
  Administered 2016-03-14 – 2016-03-15 (×3): 5000 [IU] via SUBCUTANEOUS
  Filled 2016-03-14 (×3): qty 1

## 2016-03-14 MED ORDER — POTASSIUM CHLORIDE IN NACL 40-0.9 MEQ/L-% IV SOLN
INTRAVENOUS | Status: AC
  Administered 2016-03-14: 100 mL/h via INTRAVENOUS
  Filled 2016-03-14: qty 1000

## 2016-03-14 MED ORDER — ONDANSETRON HCL 4 MG/2ML IJ SOLN
INTRAMUSCULAR | Status: AC
  Administered 2016-03-14: 4 mg via INTRAVENOUS
  Filled 2016-03-14: qty 2

## 2016-03-14 MED ORDER — SODIUM CHLORIDE 0.9 % IV BOLUS (SEPSIS)
1000.0000 mL | Freq: Once | INTRAVENOUS | Status: AC
  Administered 2016-03-14: 1000 mL via INTRAVENOUS

## 2016-03-14 MED ORDER — FOLIC ACID 1 MG PO TABS
1.0000 mg | ORAL_TABLET | Freq: Every day | ORAL | Status: DC
  Administered 2016-03-14 – 2016-03-16 (×3): 1 mg via ORAL
  Filled 2016-03-14 (×4): qty 1

## 2016-03-14 MED ORDER — SODIUM CHLORIDE 0.9% FLUSH
3.0000 mL | Freq: Two times a day (BID) | INTRAVENOUS | Status: DC
  Administered 2016-03-14 – 2016-03-15 (×2): 3 mL via INTRAVENOUS

## 2016-03-14 MED ORDER — ONDANSETRON HCL 4 MG/2ML IJ SOLN
4.0000 mg | Freq: Once | INTRAMUSCULAR | Status: AC
  Administered 2016-03-14: 4 mg via INTRAVENOUS
  Filled 2016-03-14: qty 2

## 2016-03-14 MED ORDER — OMEGA-3-ACID ETHYL ESTERS 1 G PO CAPS
1.0000 g | ORAL_CAPSULE | Freq: Every day | ORAL | Status: DC
  Administered 2016-03-15 – 2016-03-16 (×2): 1 g via ORAL
  Filled 2016-03-14 (×4): qty 1

## 2016-03-14 MED ORDER — POTASSIUM CHLORIDE CRYS ER 20 MEQ PO TBCR
20.0000 meq | EXTENDED_RELEASE_TABLET | Freq: Once | ORAL | Status: AC
  Administered 2016-03-15: 20 meq via ORAL
  Filled 2016-03-14: qty 1

## 2016-03-14 MED ORDER — CLONAZEPAM 0.5 MG PO TABS
0.5000 mg | ORAL_TABLET | Freq: Three times a day (TID) | ORAL | Status: DC | PRN
  Administered 2016-03-15 – 2016-03-17 (×4): 0.5 mg via ORAL
  Filled 2016-03-14 (×4): qty 1

## 2016-03-14 MED ORDER — POTASSIUM CHLORIDE CRYS ER 20 MEQ PO TBCR
40.0000 meq | EXTENDED_RELEASE_TABLET | Freq: Two times a day (BID) | ORAL | Status: DC
  Administered 2016-03-14: 40 meq via ORAL
  Filled 2016-03-14: qty 2

## 2016-03-14 MED ORDER — ATENOLOL 25 MG PO TABS
75.0000 mg | ORAL_TABLET | Freq: Two times a day (BID) | ORAL | Status: DC
  Administered 2016-03-14 – 2016-03-17 (×7): 75 mg via ORAL
  Filled 2016-03-14 (×7): qty 3

## 2016-03-14 MED ORDER — PROMETHAZINE HCL 25 MG/ML IJ SOLN
12.5000 mg | Freq: Four times a day (QID) | INTRAMUSCULAR | Status: DC | PRN
  Administered 2016-03-14: 12.5 mg via INTRAVENOUS
  Filled 2016-03-14: qty 1

## 2016-03-14 MED ORDER — CALCIUM LACTATE 500 MG PO CAPS: ORAL_CAPSULE | Freq: Every day | ORAL | Status: DC

## 2016-03-14 MED ORDER — MULTIVITAMINS PO CAPS: 1.0000 | ORAL_CAPSULE | Freq: Every day | ORAL | Status: DC

## 2016-03-14 MED ORDER — CALCIUM CARBONATE ANTACID 500 MG PO CHEW
0.5000 | CHEWABLE_TABLET | Freq: Every day | ORAL | Status: DC
Start: 2016-03-14 — End: 2016-03-17
  Administered 2016-03-15: 100 mg via ORAL
  Filled 2016-03-14 (×3): qty 1
  Filled 2016-03-14: qty 0.5

## 2016-03-14 MED ORDER — POTASSIUM CHLORIDE 10 MEQ/100ML IV SOLN
10.0000 meq | INTRAVENOUS | Status: AC
  Administered 2016-03-14 (×4): 10 meq via INTRAVENOUS
  Filled 2016-03-14 (×4): qty 100

## 2016-03-14 MED ORDER — DEXTROSE 5 % IV SOLN
1.0000 g | INTRAVENOUS | Status: DC
  Administered 2016-03-14 – 2016-03-17 (×4): 1 g via INTRAVENOUS
  Filled 2016-03-14 (×5): qty 10

## 2016-03-14 MED ORDER — SODIUM CHLORIDE 0.45 % IV SOLN
INTRAVENOUS | Status: DC
  Administered 2016-03-14: 07:00:00 via INTRAVENOUS
  Filled 2016-03-14 (×3): qty 1000

## 2016-03-14 MED ORDER — INFLUENZA VAC SPLIT QUAD 0.5 ML IM SUSY
0.5000 mL | PREFILLED_SYRINGE | INTRAMUSCULAR | Status: DC
  Filled 2016-03-14 (×2): qty 0.5

## 2016-03-14 MED ORDER — ONDANSETRON HCL 4 MG/2ML IJ SOLN
4.0000 mg | Freq: Four times a day (QID) | INTRAMUSCULAR | Status: DC | PRN
  Administered 2016-03-14 (×2): 4 mg via INTRAVENOUS
  Filled 2016-03-14: qty 2

## 2016-03-14 NOTE — Progress Notes (Signed)
Pharmacy consulted to replace electrolytes in this 75 year old female admitted with hypokalemia and hypnatremia  Allergies  Allergen Reactions  . Statins     Other reaction(s): Other (See Comments) Other Reaction: CNS DISORDER    Recent Labs  03/14/16 0548  NA 109*  K 2.1*  CL 69*  CO2 26  GLUCOSE 162*  BUN 12  CREATININE 0.48  CALCIUM 7.9*  MG 1.4*  PROT 6.3*  ALBUMIN 3.8  AST 40  ALT 33  ALKPHOS 65  BILITOT 2.0*   Mg: 1.4  Estimated Creatinine Clearance: 57.4 mL/min (by C-G formula based on Cr of 0.48).   No results for input(s): GLUCAP in the last 72 hours.  Assessment: Patient received potassium 28mEq PO x 1 and has orders for NS with 40k at 159ml/hr  Plan:  Will order potassium 5mEq IV x 4 and magnesium sulfate 4gm IV x 1. Will recheck potassium 1 hour after completion of runs and BMP/Mg with AM labs  Pharmacy to follow per consult  Rexene Edison, PharmD Clinical Pharmacist  03/14/2016 3:33 PM

## 2016-03-14 NOTE — Progress Notes (Signed)
Patient alert and oriented x4. Oriented to room, unit, and call bell. Admission completed. No complaints at this time. Will cont to assess. Skin assessment verified by Riesa Pope, RN. Telemetry box verified with Maryella Shivers, RN. Wilnette Kales

## 2016-03-14 NOTE — Progress Notes (Signed)
Pharmacy consulted to replace electrolytes in this 75 year old female admitted with hypokalemia and hypnatremia  Allergies  Allergen Reactions  . Statins     Other reaction(s): Other (See Comments) Other Reaction: CNS DISORDER    Recent Labs  03/14/16 0548 03/14/16 1604 03/14/16 2202  NA 109* 113*  --   K 2.1* 2.8* 3.4*  CL 69* 79*  --   CO2 26 23  --   GLUCOSE 162* 114*  --   BUN 12 9  --   CREATININE 0.48 0.39*  0.44  --   CALCIUM 7.9* 7.8*  --   MG 1.4*  --   --   PROT 6.3*  --   --   ALBUMIN 3.8  --   --   AST 40  --   --   ALT 33  --   --   ALKPHOS 65  --   --   BILITOT 2.0*  --   --    Mg: 1.4  Estimated Creatinine Clearance: 57.4 mL/min (by C-G formula based on Cr of 0.39).   No results for input(s): GLUCAP in the last 72 hours.  Assessment: Patient received potassium 5mEq PO x 1 and has orders for NS with 40k at 161ml/hr  Plan:  Will order potassium 68mEq IV x 4 and magnesium sulfate 4gm IV x 1. Will recheck potassium 1 hour after completion of runs and BMP/Mg with AM labs  3/23 PM K+ 3.4. 20 mEq KCl PO x1 ordered. BMP in AM.  Pharmacy to follow per consult  Rexene Edison, PharmD Clinical Pharmacist  03/14/2016 11:57 PM

## 2016-03-14 NOTE — H&P (Signed)
[place] [facility] [place] NAME: Linda Bradley    MR#:  [id number]  DATE OF BIRTH:  02/12/1941  DATE OF ADMISSION:  03/14/2016  PRIMARY CARE PHYSICIAN: Lynnell Jude, MD   REQUESTING/REFERRING PHYSICIAN: Lurline Hare  CHIEF COMPLAINT:   Chief Complaint  Patient presents with  . Emesis  . Diarrhea    HISTORY OF PRESENT ILLNESS: Linda Bradley  is a 75 y.o. female with a known history of Hypertension, diastolic heart failure, hypercholesterolemia- follows with cardiologist and primary care doctor. Last point in cardiologist visit her blood pressure was running high and so cardiology started her on chlorthalidone. 5 days ago patient started having flulike symptoms with some coughing and wheezing. She also had some diarrhea, 2-3 times a day with loose stool, but no blood. She started feeling overall very weak and while at her baseline she walks without any support, needed a walker for last 3-4 days. Concerned with this family took her to her primary care's office yesterday and she sent some blood work. Today early morning at 4:00 her primary care doctor got the results back and she called her that her sodium and potassium is very low and she needed to go to emergency room right away. While in emergency room she vomited for the first time but there was no blood in there and she does not feel nauseated now. Patient denies any abdominal pain. She denies any fever. She denies any recent antibiotic in last 1-2 months, and denies any sick contacts. She denies chest pain or palpitation.  PAST MEDICAL HISTORY:   Past Medical History  Diagnosis Date  . Hypertension   . Hypercholesterolemia   . Dermatitis   . Anxiety disorder   . Lymphadenitis   . Spontaneous ecchymoses   . Vitamin D deficiency   . Heart murmur   . Skin cancer     PAST SURGICAL HISTORY: Past Surgical History  Procedure Laterality Date  . Tonsillectomy    . Carpal tunnel release       [facility]  . Transthoracic echocardiogram  05/01/2015     EF 60-65%. Abnormal relaxation. Mild aortic stenosis (peak gradient 19 mmHg).    SOCIAL HISTORY:  Social History  Substance Use Topics  . Smoking status: Never Smoker   . Smokeless tobacco: Not on file  . Alcohol Use: No    FAMILY HISTORY:  Family History  Problem Relation Age of Onset  . Heart disease Father   . Heart failure Father     DRUG ALLERGIES:  Allergies  Allergen Reactions  . Statins     Other reaction(s): Other (See Comments) Other Reaction: CNS DISORDER    REVIEW OF SYSTEMS:   CONSTITUTIONAL: No fever, Positive for fatigue or weakness.  EYES: No blurred or double vision.  EARS, NOSE, AND THROAT: No tinnitus or ear pain.  RESPIRATORY: Some cough, no shortness of breath, wheezing or hemoptysis.  CARDIOVASCULAR: No chest pain, orthopnea, edema.  GASTROINTESTINAL: No nausea, vomiting, she had diarrhea, no abdominal pain.  GENITOURINARY: No dysuria, hematuria.  ENDOCRINE: No polyuria, nocturia,  HEMATOLOGY: No anemia, easy bruising or bleeding SKIN: No rash or lesion. MUSCULOSKELETAL: No joint pain or arthritis.   NEUROLOGIC: No tingling, numbness, weakness.  PSYCHIATRY: No anxiety or depression.   MEDICATIONS AT HOME:  Prior to Admission medications   Medication Sig Start Date End Date Taking? Authorizing Provider  hydrochlorothiazide (HYDRODIURIL) 25 MG tablet Take 1 tablet by mouth daily. 02/07/16  Yes Historical  Provider, MD  aspirin 81 MG tablet Take 81 mg by mouth 2 (two) times a week.     Historical Provider, MD  atenolol (TENORMIN) 50 MG tablet Take 75 mg by mouth 2 (two) times daily.     Historical Provider, MD  b complex vitamins tablet Take 2 tablets by mouth daily.    Historical Provider, MD  CALCIUM LACTATE PO Take 2 capsules by mouth daily.    Historical Provider, MD  captopril (CAPOTEN) 100 MG tablet Take 100 mg by mouth 2 (two) times daily.    Historical Provider, MD  chlorthalidone  (HYGROTON) 50 MG tablet Take 1 tablet (50 mg total) by mouth daily. 02/14/16   Leonie Man, MD  cholecalciferol (VITAMIN D) 1000 UNITS tablet Take 1,000 Units by mouth daily.    Historical Provider, MD  clonazePAM (KLONOPIN) 0.5 MG tablet Take 0.5 mg by mouth 3 (three) times daily as needed for anxiety.    Historical Provider, MD  folic acid (FOLVITE) 1 MG tablet Take 1 mg by mouth daily.    Historical Provider, MD  Multiple Vitamin (MULTIVITAMIN) capsule Take 1 capsule by mouth daily.    Historical Provider, MD  Omega-3 Fatty Acids (FISH OIL) 1200 MG CAPS Take by mouth daily.    Historical Provider, MD      PHYSICAL EXAMINATION:   VITAL SIGNS: Blood pressure 153/71, pulse 74, temperature 98.7 F (37.1 C), temperature source Oral, resp. rate 18, height 5' (1.524 m), weight 77.111 kg (170 lb), SpO2 94 %.  GENERAL:  75 y.o.-year-old patient lying in the bed with no acute distress.  EYES: Pupils equal, round, reactive to light and accommodation. No scleral icterus. Extraocular muscles intact.  HEENT: Head atraumatic, normocephalic. Oropharynx and nasopharynx clear.  NECK:  Supple, no jugular venous distention. No thyroid enlargement, no tenderness.  LUNGS: Normal breath sounds bilaterally, no wheezing, rales,rhonchi or crepitation. No use of accessory muscles of respiration.  CARDIOVASCULAR: S1, S2 normal. No murmurs, rubs, or gallops.  ABDOMEN: Soft, nontender, nondistended. Bowel sounds present. No organomegaly or mass.  EXTREMITIES: No pedal edema, cyanosis, or clubbing.  NEUROLOGIC: Cranial nerves II through XII are intact. Muscle strength 5/5 in all extremities. Sensation intact. Gait not checked.  PSYCHIATRIC: The patient is alert and oriented x 3.  SKIN: No obvious rash, lesion, or ulcer.   LABORATORY PANEL:   CBC  Recent Labs Lab 03/14/16 0548  WBC 7.0  HGB 15.5  HCT 47.2*  PLT 184  MCV 80.5  MCH 30.3  MCHC 32.8  RDW 12.2  LYMPHSABS 1.1  MONOABS 1.0*  EOSABS 0.0   BASOSABS 0.0   ------------------------------------------------------------------------------------------------------------------  Chemistries   Recent Labs Lab 03/14/16 0548  NA 109*  K 2.1*  CL 69*  CO2 26  GLUCOSE 162*  BUN 12  CREATININE 0.48  CALCIUM 7.9*  AST 40  ALT 33  ALKPHOS 65  BILITOT 2.0*   ------------------------------------------------------------------------------------------------------------------ estimated creatinine clearance is 57.4 mL/min (by C-G formula based on Cr of 0.48). ------------------------------------------------------------------------------------------------------------------ No results for input(s): TSH, T4TOTAL, T3FREE, THYROIDAB in the last 72 hours.  Invalid input(s): FREET3   Coagulation profile No results for input(s): INR, PROTIME in the last 168 hours. ------------------------------------------------------------------------------------------------------------------- No results for input(s): DDIMER in the last 72 hours. -------------------------------------------------------------------------------------------------------------------  Cardiac Enzymes No results for input(s): CKMB, TROPONINI, MYOGLOBIN in the last 168 hours.  Invalid input(s): CK ------------------------------------------------------------------------------------------------------------------ Invalid input(s): POCBNP  ---------------------------------------------------------------------------------------------------------------  Urinalysis    Component Value Date/Time   COLORURINE YELLOW* 03/14/2016 [id number]  APPEARANCEUR HAZY* 03/14/2016 0547   LABSPEC 1.013 03/14/2016 0547   PHURINE 7.0 03/14/2016 0547   GLUCOSEU 50* 03/14/2016 0547   HGBUR NEGATIVE 03/14/2016 0547   BILIRUBINUR NEGATIVE 03/14/2016 0547   KETONESUR TRACE* 03/14/2016 0547   PROTEINUR NEGATIVE 03/14/2016 0547   NITRITE NEGATIVE 03/14/2016 0547   LEUKOCYTESUR 1+* 03/14/2016 0547      RADIOLOGY: Dg Chest 2 View  03/13/2016  CLINICAL DATA:  Nonproductive cough, low-grade fever, nausea and diarrhea for the past 7 days ; syncopal episode 2 days ago. EXAM: CHEST  2 VIEW COMPARISON:  Report of a chest x-ray of December 03, 2000. FINDINGS: The lungs are adequately inflated. There is no focal infiltrate. There is no pleural effusion. The heart and pulmonary vascularity are normal. The mediastinum is normal in width. There is S-shaped thoracolumbar scoliosis. IMPRESSION: There is no active cardiopulmonary disease. Electronically Signed   By: David  Martinique M.D.   On: 03/13/2016 15:30    EKG: Orders placed or performed in visit on 02/27/16  . EKG 12-Lead  Normal sinus rhythm  IMPRESSION AND PLAN:  * Severe hypokalemia   Likely due to chlorthalidone use and diarrhea  I will stop it for now.  Monitor on telemetry.  Replace potassium IV and oral.  Check magnesium.  * Hyponatremia   Due to diuretics use and diarrhea.   Give normal saline IV with potassium.   Check TSH, urine osmole allergy, urine sodium.   Her sodium was normal a month ago so most likely this is effect of using chlorthalidone.  * Hypertension  Continue monitoring currently, continue atenolol but hold diuretics.  If needed then I will have to add amlodipine.  * Diarrhea   Denies any use of antibiotics, no associated fever.   Likely viral, will check for C. difficile and GI panel.   Keep on IV hydration.  * History of heart murmur and diastolic heart failure   Currently well compensated, continue monitoring.  All the records are reviewed and case discussed with ED provider. Management plans discussed with the patient, family and they are in agreement.  CODE STATUS: Full code Code Status History    This patient does not have a recorded code status. Please follow your organizational policy for patients in this situation.     Condition is critical due to severe low sodium and potassium level.  Explained to patient and her daughter who were present in the room.  TOTAL TIME TAKING CARE OF THIS PATIENT: 60 critical care minutes.    Vaughan Basta M.D on 03/14/2016   Between 7am to 6pm - Pager - [phone]  After 6pm go to [email or web address] - password [email or web address]  Office  [phone]  CC: Primary care physician; Lynnell Jude, MD   Note: This dictation was prepared with Dragon dictation along with smaller phrase technology. Any transcriptional errors that result from this process are unintentional.

## 2016-03-14 NOTE — ED Notes (Addendum)
Pt to triage via w/c, actively vomiting; st diarrhea x 5 days, fever; immodium admin at 7pm; st called by MD at 4am to notify of low sodium levels

## 2016-03-14 NOTE — ED Provider Notes (Signed)
[facility] Emergency Department Provider Note  ____________________________________________  Time seen: Approximately 5:59 AM  I have reviewed the triage vital signs and the nursing notes.   HISTORY  Chief Complaint Emesis and Diarrhea    HPI Linda Bradley is a 75 y.o. female who presents to the ED from home with a chief complaint of diarrhea, vomiting and lab abnormality. Patient states she had flulike symptoms last week including nonproductive cough and myalgias. She has been experiencing diarrhea for the past 5 days; approximately 3 episodes daily. Symptoms associated with low-grade fever 2 days ago, none since. Patient began to vomit once she arrived to the emergency department; she vomited 3 times. Patient was seen by her PCP yesterday and had lab work drawn, no stool cultures collected. She was called by her PCP at 4 AM who encouraged her to come to the emergency department secondary to "low sodium level". Patient denies associated symptoms of chest pain, shortness of breath, abdominal pain, dysuria. Denies recent travel or trauma. Denies recent use of antibiotics. Nothing makes her symptoms better or worse.   Past Medical History  Diagnosis Date  . Hypertension   . Hypercholesterolemia   . Dermatitis   . Anxiety disorder   . Lymphadenitis   . Spontaneous ecchymoses   . Vitamin D deficiency   . Heart murmur   . Skin cancer     Patient Active Problem List   Diagnosis Date Noted  . Aortic stenosis, mild 05/26/2015  . Abnormal EKG 04/14/2015  . Dyspnea on exertion 04/14/2015  . Accelerated essential hypertension 04/14/2015    Past Surgical History  Procedure Laterality Date  . Tonsillectomy    . Carpal tunnel release      [facility]  . Transthoracic echocardiogram  05/01/2015     EF 60-65%. Abnormal relaxation. Mild aortic stenosis (peak gradient 19 mmHg).    Current Outpatient Rx  Name  Route  Sig  Dispense  Refill  . hydrochlorothiazide  (HYDRODIURIL) 25 MG tablet   Oral   Take 1 tablet by mouth daily.         Marland Kitchen aspirin 81 MG tablet   Oral   Take 81 mg by mouth 2 (two) times a week.          Marland Kitchen atenolol (TENORMIN) 50 MG tablet   Oral   Take 75 mg by mouth 2 (two) times daily.          Marland Kitchen b complex vitamins tablet   Oral   Take 2 tablets by mouth daily.         Marland Kitchen CALCIUM LACTATE PO   Oral   Take 2 capsules by mouth daily.         . captopril (CAPOTEN) 100 MG tablet   Oral   Take 100 mg by mouth 2 (two) times daily.         . chlorthalidone (HYGROTON) 50 MG tablet   Oral   Take 1 tablet (50 mg total) by mouth daily.   30 tablet   2   . cholecalciferol (VITAMIN D) 1000 UNITS tablet   Oral   Take 1,000 Units by mouth daily.         . clonazePAM (KLONOPIN) 0.5 MG tablet   Oral   Take 0.5 mg by mouth 3 (three) times daily as needed for anxiety.         . folic acid (FOLVITE) 1 MG tablet   Oral   Take 1 mg by mouth daily.         Marland Kitchen  Multiple Vitamin (MULTIVITAMIN) capsule   Oral   Take 1 capsule by mouth daily.         . Omega-3 Fatty Acids (FISH OIL) 1200 MG CAPS   Oral   Take by mouth daily.           Allergies Statins  Family History  Problem Relation Age of Onset  . Heart disease Father   . Heart failure Father     Social History Social History  Substance Use Topics  . Smoking status: Never Smoker   . Smokeless tobacco: None  . Alcohol Use: No    Review of Systems  Constitutional: No fever/chills. Eyes: No visual changes. ENT: No sore throat. Cardiovascular: Denies chest pain. Respiratory: Denies shortness of breath. Gastrointestinal: No abdominal pain.  Positive for nausea, vomiting and diarrhea.  No constipation. Genitourinary: Negative for dysuria. Musculoskeletal: Negative for back pain. Skin: Negative for rash. Neurological: Negative for headaches, focal weakness or numbness.  10-point ROS otherwise  negative.  ____________________________________________   PHYSICAL EXAM:  VITAL SIGNS: ED Triage Vitals  Enc Vitals Group     BP 03/14/16 0518 164/90 mmHg     Pulse Rate 03/14/16 0518 72     Resp 03/14/16 0518 18     Temp 03/14/16 0518 98.7 F (37.1 C)     Temp Source 03/14/16 0518 Oral     SpO2 03/14/16 0518 95 %     Weight 03/14/16 0518 170 lb (77.111 kg)     Height 03/14/16 0518 5' (1.524 m)     Head Cir --      Peak Flow --      Pain Score 03/14/16 0556 0     Pain Loc --      Pain Edu? --      Excl. in [place]? --     Constitutional: Alert and oriented. Well appearing and in no acute distress. Eyes: Conjunctivae are normal. PERRL. EOMI. Head: Atraumatic. Nose: No congestion/rhinnorhea. Mouth/Throat: Mucous membranes are moist.  Oropharynx non-erythematous. Neck: No stridor.   Cardiovascular: Normal rate, regular rhythm. Grossly normal heart sounds.  Good peripheral circulation. Respiratory: Normal respiratory effort.  No retractions. Lungs CTAB. Gastrointestinal: Soft and nontender to light or deep palpation. No distention. No abdominal bruits. No CVA tenderness. Musculoskeletal: No lower extremity tenderness nor edema.  No joint effusions. Neurologic:  Normal speech and language. No gross focal neurologic deficits are appreciated. No gait instability. Skin:  Skin is warm, dry and intact. No rash noted. Psychiatric: Mood and affect are normal. Speech and behavior are normal.  ____________________________________________   LABS (all labs ordered are listed, but only abnormal results are displayed)  Labs Reviewed  COMPREHENSIVE METABOLIC PANEL - Abnormal; Notable for the following:    Sodium 109 (*)    Potassium 2.1 (*)    Chloride 69 (*)    Glucose, Bld 162 (*)    Calcium 7.9 (*)    Total Protein 6.3 (*)    Total Bilirubin 2.0 (*)    All other components within normal limits  GASTROINTESTINAL PANEL BY PCR, STOOL (REPLACES STOOL CULTURE)  C DIFFICILE QUICK SCREEN  W PCR REFLEX  LIPASE, BLOOD  CBC WITH DIFFERENTIAL/PLATELET  URINALYSIS COMPLETEWITH MICROSCOPIC ([facility] ONLY)   ____________________________________________  EKG  ED ECG REPORT I, Beatrice Sehgal J, the attending physician, personally viewed and interpreted this ECG.   Date: 03/14/2016  EKG Time: 0526  Rate: 73  Rhythm: normal EKG, normal sinus rhythm  Axis: Normal  Intervals:none  ST&T Change: Nonspecific  ____________________________________________  RADIOLOGY  None ____________________________________________   PROCEDURES  Procedure(s) performed: None  Critical Care performed: No  ____________________________________________   INITIAL IMPRESSION / ASSESSMENT AND PLAN / ED COURSE  Pertinent labs & imaging results that were available during my care of the patient were reviewed by me and considered in my medical decision making (see chart for details).  75 year old female who presents with a five-day history of diarrhea, now vomiting; sent to the ED by her PCP this morning for hyponatremia. Will obtain screening lab work, initiate IV fluid resuscitation and antiemetic.  ----------------------------------------- 6:43 AM on 03/14/2016 -----------------------------------------  Critical potassium 2.1, sodium 109. Updated patient and her daughter of these results; IV saline with potassium infusion ordered. Noted mild elevation of bilirubin; patient does not have abdominal tenderness on exam. She is not jaundiced. Elevation most likely secondary to recent vomiting. Discussed with hospitalist to evaluate patient in the emergency department for admission. ____________________________________________   FINAL CLINICAL IMPRESSION(S) / ED DIAGNOSES  Final diagnoses:  Diarrhea, unspecified type  Non-intractable vomiting with nausea, vomiting of unspecified type  Hyponatremia  Hypokalemia      Paulette Blanch, MD 03/14/16 [phone]

## 2016-03-15 DIAGNOSIS — E876 Hypokalemia: Secondary | ICD-10-CM | POA: Diagnosis not present

## 2016-03-15 LAB — CBC
HCT: 39.5 % (ref 35.0–47.0)
Hemoglobin: 14.5 g/dL (ref 12.0–16.0)
MCH: 29.9 pg (ref 26.0–34.0)
MCHC: 36.8 g/dL — ABNORMAL HIGH (ref 32.0–36.0)
MCV: 81.2 fL (ref 80.0–100.0)
Platelets: 173 10*3/uL (ref 150–440)
RBC: 4.86 MIL/uL (ref 3.80–5.20)
RDW: 12.2 % (ref 11.5–14.5)
WBC: 7.1 10*3/uL (ref 3.6–11.0)

## 2016-03-15 LAB — GASTROINTESTINAL PANEL BY PCR, STOOL (REPLACES STOOL CULTURE)
ASTROVIRUS: NOT DETECTED
Adenovirus F40/41: NOT DETECTED
CRYPTOSPORIDIUM: NOT DETECTED
CYCLOSPORA CAYETANENSIS: NOT DETECTED
Campylobacter species: NOT DETECTED
E. COLI O157: NOT DETECTED
ENTAMOEBA HISTOLYTICA: NOT DETECTED
ENTEROTOXIGENIC E COLI (ETEC): NOT DETECTED
Enteroaggregative E coli (EAEC): NOT DETECTED
Enteropathogenic E coli (EPEC): NOT DETECTED
Giardia lamblia: NOT DETECTED
Norovirus GI/GII: NOT DETECTED
Plesimonas shigelloides: NOT DETECTED
ROTAVIRUS A: NOT DETECTED
SALMONELLA SPECIES: NOT DETECTED
SAPOVIRUS (I, II, IV, AND V): NOT DETECTED
SHIGA LIKE TOXIN PRODUCING E COLI (STEC): NOT DETECTED
SHIGELLA/ENTEROINVASIVE E COLI (EIEC): NOT DETECTED
VIBRIO SPECIES: NOT DETECTED
Vibrio cholerae: NOT DETECTED
YERSINIA ENTEROCOLITICA: NOT DETECTED

## 2016-03-15 LAB — HEPATIC FUNCTION PANEL
ALT: 30 U/L (ref 14–54)
AST: 38 U/L (ref 15–41)
Albumin: 3.4 g/dL — ABNORMAL LOW (ref 3.5–5.0)
Alkaline Phosphatase: 59 U/L (ref 38–126)
Bilirubin, Direct: 0.3 mg/dL (ref 0.1–0.5)
Indirect Bilirubin: 1 mg/dL — ABNORMAL HIGH (ref 0.3–0.9)
Total Bilirubin: 1.3 mg/dL — ABNORMAL HIGH (ref 0.3–1.2)
Total Protein: 5.8 g/dL — ABNORMAL LOW (ref 6.5–8.1)

## 2016-03-15 LAB — BASIC METABOLIC PANEL
Anion gap: 8 (ref 5–15)
BUN: 9 mg/dL (ref 6–20)
CHLORIDE: 80 mmol/L — AB (ref 101–111)
CO2: 28 mmol/L (ref 22–32)
CREATININE: 0.56 mg/dL (ref 0.44–1.00)
Calcium: 7.5 mg/dL — ABNORMAL LOW (ref 8.9–10.3)
GFR calc Af Amer: >60 mL/min (ref >60)
GLUCOSE: 98 mg/dL (ref 65–99)
Potassium: 3.6 mmol/L (ref 3.5–5.1)
SODIUM: 116 mmol/L — AB (ref 135–145)

## 2016-03-15 LAB — C DIFFICILE QUICK SCREEN W PCR REFLEX
C DIFFICILE (CDIFF) INTERP: NEGATIVE
C Diff antigen: NEGATIVE
C Diff toxin: NEGATIVE

## 2016-03-15 LAB — MAGNESIUM: Magnesium: 2.6 mg/dL — ABNORMAL HIGH (ref 1.7–2.4)

## 2016-03-15 MED ORDER — LOPERAMIDE HCL 2 MG PO CAPS
4.0000 mg | ORAL_CAPSULE | ORAL | Status: DC | PRN
  Administered 2016-03-15: 4 mg via ORAL
  Filled 2016-03-15: qty 2

## 2016-03-15 MED ORDER — SODIUM CHLORIDE 0.9 % IV SOLN
INTRAVENOUS | Status: DC
  Administered 2016-03-15 – 2016-03-17 (×4): via INTRAVENOUS

## 2016-03-15 MED ORDER — ENOXAPARIN SODIUM 40 MG/0.4ML ~~LOC~~ SOLN
40.0000 mg | SUBCUTANEOUS | Status: DC
  Administered 2016-03-15 – 2016-03-16 (×2): 40 mg via SUBCUTANEOUS
  Filled 2016-03-15 (×2): qty 0.4

## 2016-03-15 NOTE — Care Management (Signed)
Patient presented to ED after being called by her PCP office regarding lab results.  Her potassium was 2.1.  Prior to this episode of illness, patient independent in all adls, driving, no issues accessing medical care or obtaining meds.  She is current with her PCP.  It is believed that her low potassium is related to diuretics and recent bout with diarrhea.

## 2016-03-15 NOTE — Evaluation (Addendum)
Physical Therapy Evaluation Patient Details Name: Linda Bradley MRN: [id number] DOB: 1941-04-29 Today's Date: 03/15/2016   History of Present Illness  75 yo female with N & V, diarrhea now admitted with low Na+ and K+, replenished via IV. Lives with son who works during the day.  PMHx:  heart murmur, CHF, HTN    Clinical Impression  Pt is up to transfer with PT to shift feet sideways up bed, then cues for all mobility due to her weakness and limited awareness of body mechanics.  Will focus on standing and balance to move along goals toward SNF admission.    Follow Up Recommendations SNF    Equipment Recommendations  Rolling walker with 5" wheels    Recommendations for Other Services Rehab consult     Precautions / Restrictions Precautions Precautions: Fall (telemetry) Restrictions Weight Bearing Restrictions: No      Mobility  Bed Mobility Overal bed mobility: Needs Assistance Bed Mobility: Supine to Sit;Sit to Supine     Supine to sit: Mod assist;Min assist Sit to supine: Min assist;Mod assist   General bed mobility comments: help with trunk and assist with legs both out to edge and back to bed  Transfers Overall transfer level: Needs assistance Equipment used: 1 person hand held assist Transfers: Sit to/from Stand Sit to Stand: Mod assist         General transfer comment: LE's feeling weak   Ambulation/Gait             General Gait Details: sidesteps at bedside  Stairs            Wheelchair Mobility    Modified Rankin (Stroke Patients Only)       Balance Overall balance assessment: Needs assistance Sitting-balance support: Feet supported Sitting balance-Leahy Scale: Good Sitting balance - Comments: uses hands on side of bed for support   Standing balance support: Single extremity supported;Bilateral upper extremity supported Standing balance-Leahy Scale: Fair (to partially stand) Standing balance comment: less than fair if fully  standing                             Pertinent Vitals/Pain Pain Assessment: Faces Faces Pain Scale: Hurts a little bit Pain Location: discomfort with her GI symptoms Pain Intervention(s): Monitored during session;Limited activity within patient's tolerance    Home Living Family/patient expects to be discharged to:: Private residence Living Arrangements: Children Available Help at Discharge: Family;Available PRN/intermittently Type of Home: House Home Access: Stairs to enter Entrance Stairs-Rails: Right Entrance Stairs-Number of Steps: 4 Home Layout: One level Home Equipment: Walker - 2 wheels      Prior Function Level of Independence: Independent with assistive device(s)         Comments: recent fall prompted the arrival of RW     Hand Dominance        Extremity/Trunk Assessment   Upper Extremity Assessment: Generalized weakness           Lower Extremity Assessment: Generalized weakness      Cervical / Trunk Assessment: Kyphotic  Communication   Communication: No difficulties  Cognition Arousal/Alertness: Awake/alert Behavior During Therapy: WFL for tasks assessed/performed Overall Cognitive Status: Within Functional Limits for tasks assessed                      General Comments General comments (skin integrity, edema, etc.): Pt is moving legs and assisting her mobiltiy but between edema and her abd discomfort she  cannot stand alone.  Pt is positioned after visit with head elevated as pt is feeling weak and needs to be careful due to her GI symptoms    Exercises        Assessment/Plan    PT Assessment Patient needs continued PT services  PT Diagnosis Acute pain (family in were very supportive of pt's needs with PT)   PT Problem List Decreased strength;Decreased range of motion;Decreased activity tolerance;Decreased balance;Decreased mobility;Decreased coordination;Decreased cognition;Decreased knowledge of use of DME;Decreased  safety awareness;Decreased knowledge of precautions;Cardiopulmonary status limiting activity;Obesity;Decreased skin integrity;Pain  PT Treatment Interventions DME instruction;Gait training;Stair training;Functional mobility training;Therapeutic activities;Therapeutic exercise;Balance training;Neuromuscular re-education;Patient/family education   PT Goals (Current goals can be found in the Care Plan section) Acute Rehab PT Goals Patient Stated Goal: to get home safely and able to walk again PT Goal Formulation: With patient Time For Goal Achievement: 03/29/16 Potential to Achieve Goals: Good    Frequency Min 2X/week   Barriers to discharge Inaccessible home environment;Decreased caregiver support home with no daytime help and has steps to enter house    Co-evaluation               End of Session Equipment Utilized During Treatment: Oxygen Activity Tolerance: Patient tolerated treatment well;Patient limited by fatigue Patient left: in bed;with call bell/phone within reach;with bed alarm set;with family/visitor present Nurse Communication: Mobility status         Time: 1440-1500 PT Time Calculation (min) (ACUTE ONLY): 20 min   Charges:   PT Evaluation $PT Eval Moderate Complexity: 1 Procedure     PT G CodesRamond Dial April 05, 2016, 5:44 PM   Mee Hives, PT MS Acute Rehab Dept. Number: [phone] [id number] and [place] [phone]

## 2016-03-15 NOTE — Progress Notes (Signed)
Pharmacy consulted to replace electrolytes in this 75 year old female admitted with hypokalemia and hypnatremia  Allergies  Allergen Reactions  . Statins     Other reaction(s): Other (See Comments) Other Reaction: CNS DISORDER    Recent Labs  03/14/16 0548 03/14/16 1604 03/14/16 2202 03/15/16 0407  NA 109* 113*  --  116*  K 2.1* 2.8* 3.4* 3.6  CL 69* 79*  --  80*  CO2 26 23  --  28  GLUCOSE 162* 114*  --  98  BUN 12 9  --  9  CREATININE 0.48 0.39*  0.44  --  0.56  CALCIUM 7.9* 7.8*  --  7.5*  MG 1.4*  --   --  2.6*  PROT 6.3*  --   --  5.8*  ALBUMIN 3.8  --   --  3.4*  AST 40  --   --  38  ALT 33  --   --  30  ALKPHOS 65  --   --  59  BILITOT 2.0*  --   --  1.3*  BILIDIR  --   --   --  0.3  IBILI  --   --   --  1.0*   Mg: 1.4  Estimated Creatinine Clearance: 57.4 mL/min (by C-G formula based on Cr of 0.56).   No results for input(s): GLUCAP in the last 72 hours.  Assessment: Electrolytes are WNL.   Plan:  Will f/u AM labs.   Ulice Dash, PharmD Clinical Pharmacist  03/15/2016 10:35 AM

## 2016-03-15 NOTE — Progress Notes (Signed)
Patient's daughter asking for something for relief from diarrhea for her mother. Dr. Anselm Jungling paged and made aware that all GI stool tests have been negative and patient continues to experience diarrhea. RN received orders for immodium prn.

## 2016-03-15 NOTE — Progress Notes (Signed)
[facility] [place] NAME: Linda Bradley    MR#:  [id number]  DATE OF BIRTH:  04-Dec-1941  SUBJECTIVE:  CHIEF COMPLAINT:   Chief Complaint  Patient presents with  . Emesis  . Diarrhea     Still weak, better than yesterday.   Still have diarrhea, Na and K better than yesterday.  REVIEW OF SYSTEMS:   CONSTITUTIONAL: No fever, Positive for fatigue or weakness.  EYES: No blurred or double vision.  EARS, NOSE, AND THROAT: No tinnitus or ear pain.  RESPIRATORY: Some cough, no shortness of breath, wheezing or hemoptysis.  CARDIOVASCULAR: No chest pain, orthopnea, edema.  GASTROINTESTINAL: No nausea, vomiting, she had diarrhea, no abdominal pain.  GENITOURINARY: No dysuria, hematuria.  ENDOCRINE: No polyuria, nocturia,  HEMATOLOGY: No anemia, easy bruising or bleeding SKIN: No rash or lesion. MUSCULOSKELETAL: No joint pain or arthritis.  NEUROLOGIC: No tingling, numbness, weakness.  PSYCHIATRY: No anxiety or depression.   ROS  DRUG ALLERGIES:   Allergies  Allergen Reactions  . Statins     Other reaction(s): Other (See Comments) Other Reaction: CNS DISORDER    VITALS:  Blood pressure 155/62, pulse 65, temperature 98.1 F (36.7 C), temperature source Oral, resp. rate 18, height 5' (1.524 m), weight 77.111 kg (170 lb), SpO2 96 %.  PHYSICAL EXAMINATION:  GENERAL: 75 y.o.-year-old patient lying in the bed with no acute distress.  EYES: Pupils equal, round, reactive to light and accommodation. No scleral icterus. Extraocular muscles intact.  HEENT: Head atraumatic, normocephalic. Oropharynx and nasopharynx clear.  NECK: Supple, no jugular venous distention. No thyroid enlargement, no tenderness.  LUNGS: Normal breath sounds bilaterally, no wheezing, rales,rhonchi or crepitation. No use of accessory muscles of respiration.  CARDIOVASCULAR: S1, S2 normal. No murmurs, rubs, or gallops.  ABDOMEN: Soft,  nontender, nondistended. Bowel sounds present. No organomegaly or mass.  EXTREMITIES: No pedal edema, cyanosis, or clubbing.  NEUROLOGIC: Cranial nerves II through XII are intact. Muscle strength 5/5 in all extremities. Sensation intact. Gait not checked.  PSYCHIATRIC: The patient is alert and oriented x 3.  SKIN: No obvious rash, lesion, or ulcer.   Physical Exam LABORATORY PANEL:   CBC  Recent Labs Lab 03/15/16 0407  WBC 7.1  HGB 14.5  HCT 39.5  PLT 173   ------------------------------------------------------------------------------------------------------------------  Chemistries   Recent Labs Lab 03/15/16 0407  NA 116*  K 3.6  CL 80*  CO2 28  GLUCOSE 98  BUN 9  CREATININE 0.56  CALCIUM 7.5*  MG 2.6*  AST 38  ALT 30  ALKPHOS 59  BILITOT 1.3*   ------------------------------------------------------------------------------------------------------------------  Cardiac Enzymes No results for input(s): TROPONINI in the last 168 hours. ------------------------------------------------------------------------------------------------------------------  RADIOLOGY:  No results found.  ASSESSMENT AND PLAN:   Principal Problem:   Hypokalemia Active Problems:   Hyponatremia  * Severe hypokalemia  Likely due to chlorthalidone use and diarrhea  stop it for now. Monitor on telemetry. Replace potassium IV and oral. Check magnesium.    Potassium is normal now.  * Hyponatremia  Due to diuretics use and diarrhea.  Give normal saline IV with potassium.  Checked TSH- normal, urine osmole allergy, urine sodium.  Her sodium was normal a month ago so most likely this is effect of using chlorthalidone.   Nephrology consult as Urine osmo is high.   Na little better.  * Hypertension Continue monitoring currently, continue atenolol but hold diuretics. If needed then I will have to add amlodipine.  * Diarrhea  Denies any use of antibiotics, no  associated fever.  Likely viral, c diff and GI panel are negative.  Keep on IV hydration.  * History of heart murmur and diastolic heart failure  Currently well compensated, continue monitoring.   All the records are reviewed and case discussed with Care Management/Social Workerr. Management plans discussed with the patient, family and they are in agreement.  CODE STATUS: Full.  TOTAL TIME TAKING CARE OF THIS PATIENT: 35 minutes.     POSSIBLE D/C IN 2-3 DAYS, DEPENDING ON CLINICAL CONDITION.   Vaughan Basta M.D on 03/15/2016   Between 7am to 6pm - Pager - [phone]  After 6pm go to [email or web address] - password [email or web address] [place] [facility]  Office  [phone]  CC: Primary care physician; Lynnell Jude, MD  Note: This dictation was prepared with Dragon dictation along with smaller phrase technology. Any transcriptional errors that result from this process are unintentional.

## 2016-03-15 NOTE — Progress Notes (Signed)
MD Dr. Marcille Blanco notified of sodium 116. No new orders given. RN will continue to monitor. Rachael Fee, RN

## 2016-03-16 LAB — BASIC METABOLIC PANEL
Anion gap: 9 (ref 5–15)
BUN: 8 mg/dL (ref 6–20)
CALCIUM: 7.5 mg/dL — AB (ref 8.9–10.3)
CO2: 24 mmol/L (ref 22–32)
CREATININE: 0.49 mg/dL (ref 0.44–1.00)
Chloride: 88 mmol/L — ABNORMAL LOW (ref 101–111)
GFR calc Af Amer: >60 mL/min (ref >60)
GFR calc non Af Amer: >60 mL/min (ref >60)
GLUCOSE: 98 mg/dL (ref 65–99)
Potassium: 2.6 mmol/L — CL (ref 3.5–5.1)
Sodium: 121 mmol/L — ABNORMAL LOW (ref 135–145)

## 2016-03-16 LAB — POTASSIUM: Potassium: 3.7 mmol/L (ref 3.5–5.1)

## 2016-03-16 LAB — MAGNESIUM: Magnesium: 1.8 mg/dL (ref 1.7–2.4)

## 2016-03-16 MED ORDER — MAGNESIUM SULFATE 2 GM/50ML IV SOLN
2.0000 g | Freq: Once | INTRAVENOUS | Status: AC
  Administered 2016-03-16: 2 g via INTRAVENOUS
  Filled 2016-03-16: qty 50

## 2016-03-16 MED ORDER — POTASSIUM CHLORIDE CRYS ER 20 MEQ PO TBCR
40.0000 meq | EXTENDED_RELEASE_TABLET | Freq: Once | ORAL | Status: AC
  Administered 2016-03-16: 40 meq via ORAL

## 2016-03-16 MED ORDER — POTASSIUM CHLORIDE CRYS ER 20 MEQ PO TBCR
40.0000 meq | EXTENDED_RELEASE_TABLET | Freq: Once | ORAL | Status: AC
  Administered 2016-03-16: 40 meq via ORAL
  Filled 2016-03-16 (×2): qty 2

## 2016-03-16 NOTE — Progress Notes (Signed)
[facility] [place] NAME: Linda Bradley    MR#:  [id number]  DATE OF BIRTH:  March 10, 1941  SUBJECTIVE:  No further episodes of diarrhea after receiving Imodium yesterday Still complains of some generalized weakness  REVIEW OF SYSTEMS:   CONSTITUTIONAL: No fever, Positive for fatigue or weakness.  EYES: No blurred or double vision.  EARS, NOSE, AND THROAT: No tinnitus or ear pain.  RESPIRATORY: Some cough, no shortness of breath, wheezing or hemoptysis.  CARDIOVASCULAR: No chest pain, orthopnea, edema.  GASTROINTESTINAL: No nausea, vomiting,  diarrhea, no abdominal pain.  GENITOURINARY: No dysuria, hematuria.  ENDOCRINE: No polyuria, nocturia,  HEMATOLOGY: No anemia, easy bruising or bleeding SKIN: No rash or lesion. MUSCULOSKELETAL: No joint pain or arthritis.  NEUROLOGIC: No tingling, numbness, weakness.  PSYCHIATRY: No anxiety or depression.   ROS  DRUG ALLERGIES:   Allergies  Allergen Reactions  . Statins     Other reaction(s): Other (See Comments) Other Reaction: CNS DISORDER    VITALS:  Blood pressure 127/58, pulse 67, temperature 98.1 F (36.7 C), temperature source Oral, resp. rate 16, height 5' (1.524 m), weight 77.111 kg (170 lb), SpO2 93 %.  PHYSICAL EXAMINATION:  GENERAL: 75 y.o.-year-old patient with no acute distress.  EYES: Pupils equal, round, reactive to light and accommodation. No scleral icterus. Extraocular muscles intact.  HEENT: Head atraumatic, normocephalic. Oropharynx and nasopharynx clear.  NECK: Supple, no jugular venous distention. No thyroid enlargement, no tenderness.  LUNGS: Normal breath sounds bilaterally, no wheezing, rales,rhonchi or crepitation. No use of accessory muscles of respiration.  CARDIOVASCULAR: S1, S2 normal. No murmurs, rubs, or gallops.  ABDOMEN: Soft, nontender, nondistended. Bowel sounds present. No organomegaly or mass.  EXTREMITIES: No pedal  edema, cyanosis, or clubbing.  NEUROLOGIC: Cranial nerves II through XII are intact. Muscle strength 5/5 in all extremities. Sensation intact. Gait not checked.  PSYCHIATRIC: The patient is alert and oriented x 3.  SKIN: No obvious rash, lesion, or ulcer.   Physical Exam LABORATORY PANEL:   CBC  Recent Labs Lab 03/15/16 0407  WBC 7.1  HGB 14.5  HCT 39.5  PLT 173   ------------------------------------------------------------------------------------------------------------------  Chemistries   Recent Labs Lab 03/15/16 0407 03/16/16 0559  NA 116* 121*  K 3.6 2.6*  CL 80* 88*  CO2 28 24  GLUCOSE 98 98  BUN 9 8  CREATININE 0.56 0.49  CALCIUM 7.5* 7.5*  MG 2.6* 1.8  AST 38  --   ALT 30  --   ALKPHOS 59  --   BILITOT 1.3*  --    ------------------------------------------------------------------------------------------------------------------  Cardiac Enzymes No results for input(s): TROPONINI in the last 168 hours. ------------------------------------------------------------------------------------------------------------------  RADIOLOGY:  No results found.  ASSESSMENT AND PLAN:   1. Severe hypokalemia  Likely due to chlorthalidone use and diarrhea Diarrhea improved replace potassium and magnesium to goal 4-5, greater than 2  2 Hyponatremia  Due to diuretics use and diarrhea. Continued improvement in sodium nephrology consult yesterday\ 3 Hypertension Continue monitoring currently, continue atenolol but hold diuretics. If needed then I will have to add amlodipine. 4 Diarrhea Supportive care 5. Venous thromboembolism prophylactic: Lovenox    All the records are reviewed and case discussed with Care Management/Social Workerr. Management plans discussed with the patient, family and they are in agreement.  CODE STATUS: Full.  TOTAL TIME TAKING CARE OF THIS PATIENT: 33 minutes.     POSSIBLE D/C IN 2-3 DAYS, DEPENDING ON CLINICAL  CONDITION.   Hower,  Shanon Brow  K M.D on 03/16/2016   Between 7am to 6pm - Pager - [phone]  After 6pm go to [email or web address] - password [email or web address] [place] [facility]  Office  [phone]  CC: Primary care physician; Lynnell Jude, MD  Note: This dictation was prepared with Dragon dictation along with smaller phrase technology. Any transcriptional errors that result from this process are unintentional.

## 2016-03-16 NOTE — Progress Notes (Signed)
CRITICAL VALUE ALERT  Critical value received:  Potassium 2.6  Date of notification:  03/16/2016   Time of notification:  0709  Critical value read back:Yes.    Nurse who received alert:  Legrand Pitts, RN  MD notified (1st page):  Dr. Anselm Jungling  Time of first page:  0715  MD notified (2nd page): Dr. Lavetta Nielsen  Time of second page: 0740  Responding MD: Dr. Lavetta Nielsen  Time MD responded:  7:43 AM  MD to place orders.

## 2016-03-16 NOTE — Progress Notes (Signed)
Pharmacy consulted to replace electrolytes in this 75 year old female admitted with hypokalemia and hypnatremia  Allergies  Allergen Reactions  . Statins     Other reaction(s): Other (See Comments) Other Reaction: CNS DISORDER    Recent Labs  03/14/16 0548 03/14/16 1604 03/14/16 2202 03/15/16 0407 03/16/16 0559  NA 109* 113*  --  116* 121*  K 2.1* 2.8* 3.4* 3.6 2.6*  CL 69* 79*  --  80* 88*  CO2 26 23  --  28 24  GLUCOSE 162* 114*  --  98 98  BUN 12 9  --  9 8  CREATININE 0.48 0.39*  0.44  --  0.56 0.49  CALCIUM 7.9* 7.8*  --  7.5* 7.5*  MG 1.4*  --   --  2.6* 1.8  PROT 6.3*  --   --  5.8*  --   ALBUMIN 3.8  --   --  3.4*  --   AST 40  --   --  38  --   ALT 33  --   --  30  --   ALKPHOS 65  --   --  59  --   BILITOT 2.0*  --   --  1.3*  --   BILIDIR  --   --   --  0.3  --   IBILI  --   --   --  1.0*  --    Mg: 1.4  Estimated Creatinine Clearance: 57.4 mL/min (by C-G formula based on Cr of 0.49).   No results for input(s): GLUCAP in the last 72 hours.  Assessment: Potassium and magnesium replaced by MD with magnesium sulfate 2 g iv once and potassium chloride 40 meq po x 2.   Plan:  Will recheck potassium at 1800 and replace as necessary.   Ulice Dash, PharmD Clinical Pharmacist  03/16/2016 8:08 AM

## 2016-03-16 NOTE — Plan of Care (Addendum)
Problem: Physical Regulation: Goal: Ability to maintain clinical measurements within normal limits will improve Outcome: Progressing Pt transferred from 2A. VSS. Denies pain. Potassium serum 3.7.

## 2016-03-16 NOTE — Progress Notes (Signed)
Report called to [facility], RN on 1C. Patient transporting to 1C 114. Belongings packed by daughter and will transfer with patient. Daughter aware of new room number. Orderly called for transfer.

## 2016-03-16 NOTE — Plan of Care (Signed)
Problem: Fluid Volume: Goal: Ability to maintain a balanced intake and output will improve Outcome: Progressing Patient receiving IV fluids and OOB to use bathroom every hour or so for urinating.   Problem: Nutrition: Goal: Adequate nutrition will be maintained Outcome: Progressing Patients appetite improved from yesterday, able to tolerate small bites of meals.   Problem: Bowel/Gastric: Goal: Will not experience complications related to bowel motility Outcome: Progressing No complaints of diarrhea today.

## 2016-03-16 NOTE — Plan of Care (Signed)
Problem: Pain Managment: Goal: General experience of comfort will improve Outcome: Completed/Met Date Met:  03/16/16 No complaints of pain since admission or on this shift.  Problem: Physical Regulation: Goal: Will remain free from infection Outcome: Progressing IV abx  Problem: Activity: Goal: Risk for activity intolerance will decrease Outcome: Progressing Patient OOB in chair several times this shift and oob to bathroom. Tolerating transfers well with standby assist.

## 2016-03-16 NOTE — Consult Note (Signed)
[facility] CONSULT NOTE    Date: 03/16/2016                  Patient Name:  Linda Bradley  MRN: [id number]  DOB: 1941/02/26  Age / Sex: 75 y.o., female         PCP: Lynnell Jude, MD                 Service Requesting Consult: Dr. Marthann Schiller                 Reason for Consult: Hyponatremia            History of Present Illness: Patient is a 75 y.o. female with a PMHx of Hypertension, hyperlipidemia, dermatitis, anxiety disorder, lymphadenitis, vitamin D deficiency, history of skin cancer, who was admitted to [facility] on 03/14/2016 for evaluation of diarrhea.  The patient has not been feeling well over the past 5 days. Her illness began with initially some coughing and wheezing. She subsequently developed diarrhea and having 4-5 bowel movements per day. She subsequently became weak and fatigued. She states that she was trying to keep up with her fluid loss but is unclear if she did. Upon presentation here she was found to have hypovolemic hyponatremia with a serum sodium of 109. Sodium is up to 121 today however potassium is low at 2.6. She is on IV fluid hydration with 0.9 normal saline at 75 cc per hour.  She had a chest x-ray which was clear. She was also noted is taking chlorthalidone.   Medications: Outpatient medications: Prescriptions prior to admission  Medication Sig Dispense Refill Last Dose  . aspirin 81 MG tablet Take 81 mg by mouth 2 (two) times a week. On sundays and thursdays   03/13/2016 at 0630  . atenolol (TENORMIN) 50 MG tablet Take 75 mg by mouth 2 (two) times daily.    03/13/2016 at unknown  . b complex vitamins tablet Take 2 tablets by mouth daily.   03/13/2016 at Unknown time  . CALCIUM LACTATE PO Take 2 capsules by mouth daily.   03/13/2016 at Unknown time  . captopril (CAPOTEN) 100 MG tablet Take 100 mg by mouth 2 (two) times daily.   03/13/2016 at unknown  . chlorthalidone (HYGROTON) 50 MG tablet Take 1 tablet (50 mg total) by mouth daily. 30  tablet 2 unknown at unknown  . cholecalciferol (VITAMIN D) 1000 UNITS tablet Take 1,000 Units by mouth daily.   03/13/2016 at Unknown time  . clonazePAM (KLONOPIN) 0.5 MG tablet Take 0.5 mg by mouth 3 (three) times daily as needed for anxiety.   prn at prn  . folic acid (FOLVITE) 1 MG tablet Take 1 mg by mouth daily.   03/13/2016 at Unknown time  . Multiple Vitamin (MULTIVITAMIN) capsule Take 1 capsule by mouth daily.   03/13/2016 at Unknown time  . Omega-3 Fatty Acids (FISH OIL) 1200 MG CAPS Take 1 capsule by mouth daily.    03/13/2016 at Unknown time    Current medications: Current Facility-Administered Medications  Medication Dose Route Frequency Provider Last Rate Last Dose  . 0.9 %  sodium chloride infusion   Intravenous Continuous Vaughan Basta, MD 75 mL/hr at 03/16/16 0057    . aspirin EC tablet 81 mg  81 mg Oral Once per day on Mon Thu Vaibhavkumar Vachhani, MD   81 mg at 03/14/16 1038  . atenolol (TENORMIN) tablet 75 mg  75 mg Oral BID Vaughan Basta, MD   75  mg at 03/16/16 0454  . calcium carbonate (TUMS - dosed in mg elemental calcium) chewable tablet 100 mg of elemental calcium  0.5 tablet Oral Daily Vaughan Basta, MD   100 mg of elemental calcium at 03/15/16 0902  . captopril (CAPOTEN) tablet 100 mg  100 mg Oral BID Vaughan Basta, MD   100 mg at 03/16/16 0454  . cefTRIAXone (ROCEPHIN) 1 g in dextrose 5 % 50 mL IVPB  1 g Intravenous Q24H Vaughan Basta, MD 100 mL/hr at 03/16/16 0806 1 g at 03/16/16 0806  . cholecalciferol (VITAMIN D) tablet 1,000 Units  1,000 Units Oral Daily Vaughan Basta, MD   1,000 Units at 03/16/16 0858  . clonazePAM (KLONOPIN) tablet 0.5 mg  0.5 mg Oral TID PRN Vaughan Basta, MD   0.5 mg at 03/16/16 0454  . enoxaparin (LOVENOX) injection 40 mg  40 mg Subcutaneous Q24H Vaughan Basta, MD   40 mg at [id number]  . folic acid (FOLVITE) tablet 1 mg  1 mg Oral Daily Vaughan Basta, MD   1 mg at 03/16/16  0858  . Influenza vac split quadrivalent PF (FLUARIX) injection 0.5 mL  0.5 mL Intramuscular Tomorrow-1000 Vaughan Basta, MD      . loperamide (IMODIUM) capsule 4 mg  4 mg Oral PRN Vaughan Basta, MD   4 mg at 03/15/16 1741  . multivitamin with minerals tablet 1 tablet  1 tablet Oral Daily Vaughan Basta, MD   1 tablet at 03/16/16 0858  . omega-3 acid ethyl esters (LOVAZA) capsule 1 g  1 g Oral Daily Vaughan Basta, MD   1 g at 03/16/16 0857  . ondansetron (ZOFRAN) injection 4 mg  4 mg Intravenous Q6H PRN Vaughan Basta, MD   4 mg at 03/14/16 1746  . promethazine (PHENERGAN) injection 12.5 mg  12.5 mg Intravenous Q6H PRN Vaughan Basta, MD   12.5 mg at 03/14/16 1206  . sodium chloride flush (NS) 0.9 % injection 3 mL  3 mL Intravenous Q12H Vaughan Basta, MD   3 mL at 03/15/16 0904      Allergies: Allergies  Allergen Reactions  . Statins     Other reaction(s): Other (See Comments) Other Reaction: CNS DISORDER      Past Medical History: Past Medical History  Diagnosis Date  . Hypertension   . Hypercholesterolemia   . Dermatitis   . Anxiety disorder   . Lymphadenitis   . Spontaneous ecchymoses   . Vitamin D deficiency   . Heart murmur   . Skin cancer      Past Surgical History: Past Surgical History  Procedure Laterality Date  . Tonsillectomy    . Carpal tunnel release      [facility]  . Transthoracic echocardiogram  05/01/2015     EF 60-65%. Abnormal relaxation. Mild aortic stenosis (peak gradient 19 mmHg).     Family History: Family History  Problem Relation Age of Onset  . Heart disease Father   . Heart failure Father      Social History: Social History   Social History  . Marital Status: Married    Spouse Name: N/A  . Number of Children: N/A  . Years of Education: N/A   Occupational History  . Not on file.   Social History Main Topics  . Smoking status: Never Smoker   . Smokeless tobacco: Not on file  .  Alcohol Use: No  . Drug Use: No  . Sexual Activity: Not on file   Other Topics Concern  . Not on  file   Social History Narrative     Review of Systems: Review of Systems  Constitutional: Positive for malaise/fatigue. Negative for fever, chills and weight loss.  HENT: Negative for ear discharge, ear pain and tinnitus.   Eyes: Negative for blurred vision, double vision and redness.  Respiratory: Positive for cough. Negative for hemoptysis and sputum production.   Cardiovascular: Negative for chest pain, palpitations and orthopnea.  Gastrointestinal: Positive for nausea and diarrhea. Negative for vomiting.  Genitourinary: Negative for dysuria, urgency and frequency.  Musculoskeletal: Negative for myalgias and neck pain.  Skin: Negative for itching and rash.  Neurological: Negative for dizziness, focal weakness and headaches.  Endo/Heme/Allergies: Negative for polydipsia. Does not bruise/bleed easily.  Psychiatric/Behavioral: Negative for depression. The patient is not nervous/anxious.      Vital Signs: Blood pressure 127/58, pulse 67, temperature 98.1 F (36.7 C), temperature source Oral, resp. rate 16, height 5' (1.524 m), weight 77.111 kg (170 lb), SpO2 93 %.  Weight trends: Filed Weights   03/14/16 0518  Weight: 77.111 kg (170 lb)    Physical Exam: General: NAD, resting comfortably  Head: Normocephalic, atraumatic.  Eyes: Anicteric, EOMI  Nose: Mucous membranes dry, not inflammed, nonerythematous.  Throat: Oropharynx nonerythematous, no exudate appreciated.   Neck: Supple, trachea midline.  Lungs:  Normal respiratory effort. Clear to auscultation BL without crackles or wheezes.  Heart: RRR. S1 and S2 normal without gallop, murmur, or rubs.  Abdomen:  BS normoactive. Soft, Nondistended, non-tender.  No masses or organomegaly.  Extremities: No pretibial edema.  Neurologic: A&O X3, Motor strength is 5/5 in the all 4 extremities  Skin: No visible rashes, scars.    Lab  results: Basic Metabolic Panel:  Recent Labs Lab 03/14/16 0548 03/14/16 1604 03/14/16 2202 03/15/16 0407 03/16/16 0559  NA 109* 113*  --  116* 121*  K 2.1* 2.8* 3.4* 3.6 2.6*  CL 69* 79*  --  80* 88*  CO2 26 23  --  28 24  GLUCOSE 162* 114*  --  98 98  BUN 12 9  --  9 8  CREATININE 0.48 0.39*  0.44  --  0.56 0.49  CALCIUM 7.9* 7.8*  --  7.5* 7.5*  MG 1.4*  --   --  2.6* 1.8    Liver Function Tests:  Recent Labs Lab 03/14/16 0548 03/15/16 0407  AST 40 38  ALT 33 30  ALKPHOS 65 59  BILITOT 2.0* 1.3*  PROT 6.3* 5.8*  ALBUMIN 3.8 3.4*    Recent Labs Lab 03/14/16 0548  LIPASE 19   No results for input(s): AMMONIA in the last 168 hours.  CBC:  Recent Labs Lab 03/14/16 0548 03/14/16 1604 03/15/16 0407  WBC 7.0 7.2 7.1  NEUTROABS 4.9  --   --   HGB 15.5 15.1 14.5  HCT 47.2* 44.8 39.5  MCV 80.5 80.9 81.2  PLT 184 146* 173    Cardiac Enzymes: No results for input(s): CKTOTAL, CKMB, CKMBINDEX, TROPONINI in the last 168 hours.  BNP: Invalid input(s): POCBNP  CBG: No results for input(s): GLUCAP in the last 168 hours.  Microbiology: Results for orders placed or performed during the hospital encounter of 03/14/16  Urine culture     Status: None (Preliminary result)   Collection Time: 03/14/16  5:47 AM  Result Value Ref Range Status   Specimen Description URINE, RANDOM  Final   Special Requests NONE  Final   Culture   Final    20,000 COLONIES/mL GRAM NEGATIVE RODS IDENTIFICATION AND  SUSCEPTIBILITIES TO FOLLOW    Report Status PENDING  Incomplete  Gastrointestinal Panel by PCR , Stool     Status: None   Collection Time: 03/14/16 10:30 AM  Result Value Ref Range Status   Campylobacter species NOT DETECTED NOT DETECTED Final   Plesimonas shigelloides NOT DETECTED NOT DETECTED Final   Salmonella species NOT DETECTED NOT DETECTED Final   Yersinia enterocolitica NOT DETECTED NOT DETECTED Final   Vibrio species NOT DETECTED NOT DETECTED Final   Vibrio  cholerae NOT DETECTED NOT DETECTED Final   Enteroaggregative E coli (EAEC) NOT DETECTED NOT DETECTED Final   Enteropathogenic E coli (EPEC) NOT DETECTED NOT DETECTED Final   Enterotoxigenic E coli (ETEC) NOT DETECTED NOT DETECTED Final   Shiga like toxin producing E coli (STEC) NOT DETECTED NOT DETECTED Final   E. coli O157 NOT DETECTED NOT DETECTED Final   Shigella/Enteroinvasive E coli (EIEC) NOT DETECTED NOT DETECTED Final   Cryptosporidium NOT DETECTED NOT DETECTED Final   Cyclospora cayetanensis NOT DETECTED NOT DETECTED Final   Entamoeba histolytica NOT DETECTED NOT DETECTED Final   Giardia lamblia NOT DETECTED NOT DETECTED Final   Adenovirus F40/41 NOT DETECTED NOT DETECTED Final   Astrovirus NOT DETECTED NOT DETECTED Final   Norovirus GI/GII NOT DETECTED NOT DETECTED Final   Rotavirus A NOT DETECTED NOT DETECTED Final   Sapovirus (I, II, IV, and V) NOT DETECTED NOT DETECTED Final  C difficile quick scan w PCR reflex     Status: None   Collection Time: 03/14/16 10:30 AM  Result Value Ref Range Status   C Diff antigen NEGATIVE NEGATIVE Final   C Diff toxin NEGATIVE NEGATIVE Final   C Diff interpretation Negative for C. difficile  Final    Coagulation Studies: No results for input(s): LABPROT, INR in the last 72 hours.  Urinalysis:  Recent Labs  03/14/16 0547  COLORURINE YELLOW*  LABSPEC 1.013  PHURINE 7.0  GLUCOSEU 50*  HGBUR NEGATIVE  BILIRUBINUR NEGATIVE  KETONESUR TRACE*  PROTEINUR NEGATIVE  NITRITE NEGATIVE  LEUKOCYTESUR 1+*      Imaging:  No results found.   Assessment & Plan: Pt is a 75 y.o. female  with a PMHx of Hypertension, hyperlipidemia, dermatitis, anxiety disorder, lymphadenitis, vitamin D deficiency, history of skin cancer, who was admitted to [facility] on 03/14/2016 for evaluation of diarrhea.   Problem List: 1.  Hyponatremia, hypovolemic in nature due to diarrhea/chlorthalidone. 2.  Hypokalemia. 3.  Hypertension.  Plan:  The patient  presented to [facility] with diarrhea. She was also noted to be on chlorthalidone. The diarrhea and chlorthalidone likely contributed to hyponatremia and hypokalemia. Presenting sodium was 109. Serum sodium is now up to 121 2 days later. Continue IV fluid hydration with 0.9 normal saline at 75cc/hr. Serum sodium appears to be correcting at an appropriate rate.  Patient has been administered 2 doses of potassium chloride today which should improve potassium. We recommend continued observation of serum electrolytes over the course of her hospitalization. Thanks for consultation.

## 2016-03-16 NOTE — Progress Notes (Signed)
Pharmacy consulted to replace electrolytes in this 75 year old female admitted with hypokalemia and hypnatremia  Allergies  Allergen Reactions  . Statins     Other reaction(s): Other (See Comments) Other Reaction: CNS DISORDER    Recent Labs  03/14/16 0548 03/14/16 1604  03/15/16 0407 03/16/16 0559 03/16/16 1815  NA 109* 113*  --  116* 121*  --   K 2.1* 2.8*  < > 3.6 2.6* 3.7  CL 69* 79*  --  80* 88*  --   CO2 26 23  --  28 24  --   GLUCOSE 162* 114*  --  98 98  --   BUN 12 9  --  9 8  --   CREATININE 0.48 0.39*  0.44  --  0.56 0.49  --   CALCIUM 7.9* 7.8*  --  7.5* 7.5*  --   MG 1.4*  --   --  2.6* 1.8  --   PROT 6.3*  --   --  5.8*  --   --   ALBUMIN 3.8  --   --  3.4*  --   --   AST 40  --   --  38  --   --   ALT 33  --   --  30  --   --   ALKPHOS 65  --   --  59  --   --   BILITOT 2.0*  --   --  1.3*  --   --   BILIDIR  --   --   --  0.3  --   --   IBILI  --   --   --  1.0*  --   --   < > = values in this interval not displayed. Mg: 1.4  Estimated Creatinine Clearance: 57.4 mL/min (by C-G formula based on Cr of 0.49).   No results for input(s): GLUCAP in the last 72 hours.  Assessment: Potassium and magnesium replaced by MD with magnesium sulfate 2 g iv once and potassium chloride 40 meq po x 2.  3/25 @ 1815 K= 3.7  Plan:  K WNL. Will recheck K and Mg in the AM  Yoskar Murrillo D Emina Ribaudo, Pharm.D Clinical Pharmacist   03/16/2016 7:01 PM

## 2016-03-17 LAB — URINE CULTURE: Culture: 20000

## 2016-03-17 LAB — MAGNESIUM: Magnesium: 2 mg/dL (ref 1.7–2.4)

## 2016-03-17 LAB — BASIC METABOLIC PANEL
ANION GAP: 5 (ref 5–15)
BUN: 9 mg/dL (ref 6–20)
CALCIUM: 8 mg/dL — AB (ref 8.9–10.3)
CO2: 23 mmol/L (ref 22–32)
Chloride: 102 mmol/L (ref 101–111)
Creatinine, Ser: 0.5 mg/dL (ref 0.44–1.00)
GFR calc Af Amer: >60 mL/min (ref >60)
GLUCOSE: 92 mg/dL (ref 65–99)
POTASSIUM: 3.7 mmol/L (ref 3.5–5.1)
Sodium: 130 mmol/L — ABNORMAL LOW (ref 135–145)

## 2016-03-17 MED ORDER — LOPERAMIDE HCL 2 MG PO CAPS: 4.0000 mg | ORAL_CAPSULE | ORAL | Status: DC | PRN

## 2016-03-17 NOTE — Progress Notes (Signed)
Pt is being discharged home. Discharge papers given and explained to pt. Meds and follow up appointment reviewed with pt. Pt instructed to scheduled the follow up appointment on Monday. RX given to pt.

## 2016-03-17 NOTE — Discharge Summary (Signed)
[facility] NAME: [name]    MR#:  [id number]  DATE OF BIRTH:  03-31-1941  DATE OF ADMISSION:  03/14/2016 ADMITTING PHYSICIAN: Vaughan Basta, MD  DATE OF DISCHARGE: No discharge date for patient encounter.  PRIMARY CARE PHYSICIAN: BLISS, Lynnell Jude, MD    ADMISSION DIAGNOSIS:  Hypokalemia [E87.6] Hyponatremia [E87.1] Non-intractable vomiting with nausea, vomiting of unspecified type [R11.2] Diarrhea, unspecified type [R19.7]  DISCHARGE DIAGNOSIS:  Hypokalemia Hyponatremia Urinary tract infection-Escherichia coli completed antibiotic course  SECONDARY DIAGNOSIS:   Past Medical History  Diagnosis Date  . Hypertension   . Hypercholesterolemia   . Dermatitis   . Anxiety disorder   . Lymphadenitis   . Spontaneous ecchymoses   . Vitamin D deficiency   . Heart murmur   . Skin cancer     HOSPITAL COURSE:  Linda Bradley  is a 75 y.o. female admitted 03/14/2016 with chief complaint vomiting and diarrhea. Please see H&P performed by Dr. Anselm Jungling for further information. She was admitted with the above complaints which fortunately resolved secondary to viral gastroenteritis. On admission she was noted to have markedly low sodium and potassium levels. Which have both improved dramatically with replacement. Her diarrhea has resolved. She is taken off chlorthalidone which is also a likely culprit for her electrolyte abnormality. She is improved and stable for discharge DISCHARGE CONDITIONS:   stable/improved  CONSULTS OBTAINED:  Treatment Team:  Anthonette Legato, MD Lytle Butte, MD  DRUG ALLERGIES:   Allergies  Allergen Reactions  . Statins     Other reaction(s): Other (See Comments) Other Reaction: CNS DISORDER    DISCHARGE MEDICATIONS:   Current Discharge Medication List    START taking these medications   Details  loperamide (IMODIUM) 2 MG capsule Take 2 capsules (4 mg total) by mouth as needed  for diarrhea or loose stools. Qty: 30 capsule, Refills: 0      CONTINUE these medications which have NOT CHANGED   Details  aspirin 81 MG tablet Take 81 mg by mouth 2 (two) times a week. On sundays and thursdays    atenolol (TENORMIN) 50 MG tablet Take 75 mg by mouth 2 (two) times daily.     b complex vitamins tablet Take 2 tablets by mouth daily.    CALCIUM LACTATE PO Take 2 capsules by mouth daily.    captopril (CAPOTEN) 100 MG tablet Take 100 mg by mouth 2 (two) times daily.    chlorthalidone (HYGROTON) 50 MG tablet Take 1 tablet (50 mg total) by mouth daily. Qty: 30 tablet, Refills: 2    cholecalciferol (VITAMIN D) 1000 UNITS tablet Take 1,000 Units by mouth daily.    clonazePAM (KLONOPIN) 0.5 MG tablet Take 0.5 mg by mouth 3 (three) times daily as needed for anxiety.    folic acid (FOLVITE) 1 MG tablet Take 1 mg by mouth daily.    Multiple Vitamin (MULTIVITAMIN) capsule Take 1 capsule by mouth daily.    Omega-3 Fatty Acids (FISH OIL) 1200 MG CAPS Take 1 capsule by mouth daily.          DISCHARGE INSTRUCTIONS:    DIET:  Regular diet  DISCHARGE CONDITION:  Good  ACTIVITY:  Activity as tolerated  OXYGEN:  Home Oxygen: No.   Oxygen Delivery: room air  DISCHARGE LOCATION:  home   If you experience worsening of your admission symptoms, develop shortness of breath, life threatening emergency, suicidal or homicidal thoughts you must seek medical attention immediately by  calling 911 or calling your MD immediately  if symptoms less severe.  You Must read complete instructions/literature along with all the possible adverse reactions/side effects for all the Medicines you take and that have been prescribed to you. Take any new Medicines after you have completely understood and accpet all the possible adverse reactions/side effects.   Please note  You were cared for by a hospitalist during your hospital stay. If you have any questions about your discharge  medications or the care you received while you were in the hospital after you are discharged, you can call the unit and asked to speak with the hospitalist on call if the hospitalist that took care of you is not available. Once you are discharged, your primary care physician will handle any further medical issues. Please note that NO REFILLS for any discharge medications will be authorized once you are discharged, as it is imperative that you return to your primary care physician (or establish a relationship with a primary care physician if you do not have one) for your aftercare needs so that they can reassess your need for medications and monitor your lab values.    On the day of Discharge:   VITAL SIGNS:  Blood pressure 174/73, pulse 79, temperature 97.9 F (36.6 C), temperature source Oral, resp. rate 18, height 5' (1.524 m), weight 77.111 kg (170 lb), SpO2 99 %.  I/O:   Intake/Output Summary (Last 24 hours) at 03/17/16 1014 Last data filed at 03/17/16 0900  Gross per 24 hour  Intake 1463.75 ml  Output   2300 ml  Net -836.25 ml    PHYSICAL EXAMINATION:  GENERAL:  75 y.o.-year-old patient lying in the bed with no acute distress.  EYES: Pupils equal, round, reactive to light and accommodation. No scleral icterus. Extraocular muscles intact.  HEENT: Head atraumatic, normocephalic. Oropharynx and nasopharynx clear.  NECK:  Supple, no jugular venous distention. No thyroid enlargement, no tenderness.  LUNGS: Normal breath sounds bilaterally, no wheezing, rales,rhonchi or crepitation. No use of accessory muscles of respiration.  CARDIOVASCULAR: S1, S2 normal. No murmurs, rubs, or gallops.  ABDOMEN: Soft, non-tender, non-distended. Bowel sounds present. No organomegaly or mass.  EXTREMITIES: No pedal edema, cyanosis, or clubbing.  NEUROLOGIC: Cranial nerves II through XII are intact. Muscle strength 5/5 in all extremities. Sensation intact. Gait not checked.  PSYCHIATRIC: The patient is  alert and oriented x 3.  SKIN: No obvious rash, lesion, or ulcer.   DATA REVIEW:   CBC  Recent Labs Lab 03/15/16 0407  WBC 7.1  HGB 14.5  HCT 39.5  PLT 173    Chemistries   Recent Labs Lab 03/15/16 0407  03/17/16 0424  NA 116*  < > 130*  K 3.6  < > 3.7  CL 80*  < > 102  CO2 28  < > 23  GLUCOSE 98  < > 92  BUN 9  < > 9  CREATININE 0.56  < > 0.50  CALCIUM 7.5*  < > 8.0*  MG 2.6*  < > 2.0  AST 38  --   --   ALT 30  --   --   ALKPHOS 59  --   --   BILITOT 1.3*  --   --   < > = values in this interval not displayed.  Cardiac Enzymes No results for input(s): TROPONINI in the last 168 hours.  Microbiology Results  Results for orders placed or performed during the hospital encounter of 03/14/16  Urine culture  Status: None   Collection Time: 03/14/16  5:47 AM  Result Value Ref Range Status   Specimen Description URINE, RANDOM  Final   Special Requests NONE  Final   Culture 20,000 COLONIES/mL ESCHERICHIA COLI  Final   Report Status 03/17/2016 FINAL  Final   Organism ID, Bacteria ESCHERICHIA COLI  Final      Susceptibility   Escherichia coli - MIC*    AMPICILLIN <=2 SENSITIVE Sensitive     CEFAZOLIN <=4 SENSITIVE Sensitive     CEFTRIAXONE <=1 SENSITIVE Sensitive     CIPROFLOXACIN <=0.25 SENSITIVE Sensitive     GENTAMICIN <=1 SENSITIVE Sensitive     IMIPENEM <=0.25 SENSITIVE Sensitive     NITROFURANTOIN <=16 SENSITIVE Sensitive     TRIMETH/SULFA <=20 SENSITIVE Sensitive     AMPICILLIN/SULBACTAM <=2 SENSITIVE Sensitive     PIP/TAZO <=4 SENSITIVE Sensitive     Extended ESBL NEGATIVE Sensitive     * 20,000 COLONIES/mL ESCHERICHIA COLI  Gastrointestinal Panel by PCR , Stool     Status: None   Collection Time: 03/14/16 10:30 AM  Result Value Ref Range Status   Campylobacter species NOT DETECTED NOT DETECTED Final   Plesimonas shigelloides NOT DETECTED NOT DETECTED Final   Salmonella species NOT DETECTED NOT DETECTED Final   Yersinia enterocolitica NOT DETECTED  NOT DETECTED Final   Vibrio species NOT DETECTED NOT DETECTED Final   Vibrio cholerae NOT DETECTED NOT DETECTED Final   Enteroaggregative E coli (EAEC) NOT DETECTED NOT DETECTED Final   Enteropathogenic E coli (EPEC) NOT DETECTED NOT DETECTED Final   Enterotoxigenic E coli (ETEC) NOT DETECTED NOT DETECTED Final   Shiga like toxin producing E coli (STEC) NOT DETECTED NOT DETECTED Final   E. coli O157 NOT DETECTED NOT DETECTED Final   Shigella/Enteroinvasive E coli (EIEC) NOT DETECTED NOT DETECTED Final   Cryptosporidium NOT DETECTED NOT DETECTED Final   Cyclospora cayetanensis NOT DETECTED NOT DETECTED Final   Entamoeba histolytica NOT DETECTED NOT DETECTED Final   Giardia lamblia NOT DETECTED NOT DETECTED Final   Adenovirus F40/41 NOT DETECTED NOT DETECTED Final   Astrovirus NOT DETECTED NOT DETECTED Final   Norovirus GI/GII NOT DETECTED NOT DETECTED Final   Rotavirus A NOT DETECTED NOT DETECTED Final   Sapovirus (I, II, IV, and V) NOT DETECTED NOT DETECTED Final  C difficile quick scan w PCR reflex     Status: None   Collection Time: 03/14/16 10:30 AM  Result Value Ref Range Status   C Diff antigen NEGATIVE NEGATIVE Final   C Diff toxin NEGATIVE NEGATIVE Final   C Diff interpretation Negative for C. difficile  Final    RADIOLOGY:  No results found.   Management plans discussed with the patient, family and they are in agreement.  CODE STATUS:     Code Status Orders        Start     Ordered   03/14/16 0926  Full code   Continuous     03/14/16 0926    Code Status History    Date Active Date Inactive Code Status Order ID Comments User Context   This patient has a current code status but no historical code status.      TOTAL TIME TAKING CARE OF THIS PATIENT: 28 minutes.    Jamela Cumbo,  Karenann Cai.D on 03/17/2016 at 10:14 AM  Between 7am to 6pm - Pager - [phone]  After 6pm go to [email or web address] - password Child psychotherapist Hospitalists  Office  [phone]  CC: Primary care physician; Lynnell Jude, MD

## 2016-03-17 NOTE — Progress Notes (Signed)
Pharmacy consulted to replace electrolytes in this 75 year old female admitted with hypokalemia and hypnatremia  Allergies  Allergen Reactions  . Statins     Other reaction(s): Other (See Comments) Other Reaction: CNS DISORDER    Recent Labs  03/15/16 0407 03/16/16 0559 03/16/16 1815 03/17/16 0424  NA 116* 121*  --  130*  K 3.6 2.6* 3.7 3.7  CL 80* 88*  --  102  CO2 28 24  --  23  GLUCOSE 98 98  --  92  BUN 9 8  --  9  CREATININE 0.56 0.49  --  0.50  CALCIUM 7.5* 7.5*  --  8.0*  MG 2.6* 1.8  --  2.0  PROT 5.8*  --   --   --   ALBUMIN 3.4*  --   --   --   AST 38  --   --   --   ALT 30  --   --   --   ALKPHOS 59  --   --   --   BILITOT 1.3*  --   --   --   BILIDIR 0.3  --   --   --   IBILI 1.0*  --   --   --    Mg: 1.4  Estimated Creatinine Clearance: 57.4 mL/min (by C-G formula based on Cr of 0.5).   No results for input(s): GLUCAP in the last 72 hours.  Assessment: Electrolytes are WNL.   Plan:  Will f/u AM labs.   Ulice Dash, PharmD Clinical Pharmacist   03/17/2016 7:04 AM

## 2016-05-01 ENCOUNTER — Ambulatory Visit: Payer: Self-pay | Admitting: Cardiology

## 2016-09-05 ENCOUNTER — Telehealth: Payer: Self-pay | Admitting: Cardiology

## 2016-09-05 NOTE — Telephone Encounter (Signed)
Attempted to schedule fu from recall.  Patient has changed to Dr. Nehemiah Massed.  Deleting recall.

## 2017-05-01 ENCOUNTER — Emergency Department
Admission: EM | Admit: 2017-05-01 | Discharge: 2017-05-01 | Disposition: A | Discharge status: Home / Self Care | Payer: Medicare HMO | Attending: Emergency Medicine | Admitting: Emergency Medicine

## 2017-05-01 ENCOUNTER — Encounter: Payer: Self-pay | Admitting: *Deleted

## 2017-05-01 ENCOUNTER — Emergency Department: Payer: Medicare HMO

## 2017-05-01 DIAGNOSIS — Z85828 Personal history of other malignant neoplasm of skin: Secondary | ICD-10-CM | POA: Diagnosis not present

## 2017-05-01 DIAGNOSIS — Z79899 Other long term (current) drug therapy: Secondary | ICD-10-CM | POA: Insufficient documentation

## 2017-05-01 DIAGNOSIS — I160 Hypertensive urgency: Secondary | ICD-10-CM

## 2017-05-01 DIAGNOSIS — I10 Essential (primary) hypertension: Secondary | ICD-10-CM | POA: Diagnosis present

## 2017-05-01 DIAGNOSIS — E871 Hypo-osmolality and hyponatremia: Secondary | ICD-10-CM

## 2017-05-01 DIAGNOSIS — Z7982 Long term (current) use of aspirin: Secondary | ICD-10-CM | POA: Diagnosis not present

## 2017-05-01 LAB — BASIC METABOLIC PANEL
Anion gap: 10 (ref 5–15)
BUN: 18 mg/dL (ref 6–20)
CALCIUM: 9.5 mg/dL (ref 8.9–10.3)
CO2: 27 mmol/L (ref 22–32)
CREATININE: 0.9 mg/dL (ref 0.44–1.00)
Chloride: 92 mmol/L — ABNORMAL LOW (ref 101–111)
GFR calc Af Amer: >60 mL/min (ref >60)
GLUCOSE: 113 mg/dL — AB (ref 65–99)
Potassium: 3.4 mmol/L — ABNORMAL LOW (ref 3.5–5.1)
Sodium: 129 mmol/L — ABNORMAL LOW (ref 135–145)

## 2017-05-01 LAB — CBC
HCT: 46.2 % (ref 35.0–47.0)
Hemoglobin: 15.9 g/dL (ref 12.0–16.0)
MCH: 29.3 pg (ref 26.0–34.0)
MCHC: 34.4 g/dL (ref 32.0–36.0)
MCV: 85.2 fL (ref 80.0–100.0)
Platelets: 293 10*3/uL (ref 150–440)
RBC: 5.42 MIL/uL — ABNORMAL HIGH (ref 3.80–5.20)
RDW: 13.2 % (ref 11.5–14.5)
WBC: 13.1 10*3/uL — ABNORMAL HIGH (ref 3.6–11.0)

## 2017-05-01 LAB — TROPONIN I

## 2017-05-01 MED ORDER — CLONIDINE HCL 0.1 MG PO TABS: 0.1000 mg | ORAL_TABLET | Freq: Once | ORAL | Status: DC

## 2017-05-01 MED ORDER — HYDRALAZINE HCL 50 MG PO TABS: 25.0000 mg | ORAL_TABLET | Freq: Once | ORAL | Status: DC

## 2017-05-01 MED ORDER — CLONIDINE HCL 0.1 MG PO TABS: 0.1000 mg | ORAL_TABLET | Freq: Three times a day (TID) | ORAL | 0 refills | Status: DC

## 2017-05-01 NOTE — ED Triage Notes (Signed)
Pt complains of hypertension with dizziness intermittently over the last 3 weeks

## 2017-05-01 NOTE — ED Notes (Signed)
Pt discharged to home.  Family member driving.  Discharge instructions reviewed.  Verbalized understanding.  No questions or concerns at this time.  Teach back verified.  Pt in NAD.  No items left in ED.   

## 2017-05-01 NOTE — ED Notes (Signed)
EDP requested that this RN have patient take her home does of hydralazine and clonidine from home medication.  Patient informed.

## 2017-05-01 NOTE — ED Provider Notes (Signed)
[facility] Emergency Department Provider Note   ____________________________________________   First MD Initiated Contact with Patient 05/01/17 1214     (approximate)  I have reviewed the triage vital signs and the nursing notes.   HISTORY  Chief Complaint Hypertension    HPI Linda Bradley is a 76 y.o. female history of hypertension presents today reports that her blood pressure did remain elevated on home checks, and she felt like it was causing her to feel very anxious today. She's had multiple blood pressure medicines including adding hydrochlorothiazide as well as hydralazine about 3 weeks ago to her regimen. She has a log book and demonstrates to be regular blood pressures in the 170-190 range over the last 3 weeks. She's been seeing Dr. Nehemiah Massed for this. She reports that he is presently out of town.  No chest pain or trouble breathing. Had a slight headache off and on for the last couple of weeks, but presently resolved. No nausea or vomiting. No vision changes now, but felt like she had a little "blurriness" in her vision a couple days agoand feeling occasionally dizzy which she further describes a feeling of slight lightheadedness off and on.  No trouble speaking. No numbness or weakness in the arms or legs. Denies any headache now.   Past Medical History:  Diagnosis Date  . Anxiety disorder   . Dermatitis   . Heart murmur   . Hypercholesterolemia   . Hypertension   . Lymphadenitis   . Skin cancer   . Spontaneous ecchymoses   . Vitamin D deficiency     Patient Active Problem List   Diagnosis Date Noted  . Hypokalemia 03/14/2016  . Hyponatremia 03/14/2016  . Aortic stenosis, mild 05/26/2015  . Abnormal EKG 04/14/2015  . Dyspnea on exertion 04/14/2015  . Accelerated essential hypertension 04/14/2015    Past Surgical History:  Procedure Laterality Date  . CARPAL TUNNEL RELEASE     [facility]  . TONSILLECTOMY    . TRANSTHORACIC  ECHOCARDIOGRAM  05/01/2015    EF 60-65%. Abnormal relaxation. Mild aortic stenosis (peak gradient 19 mmHg).    Prior to Admission medications   Medication Sig Start Date End Date Taking? Authorizing Provider  aspirin 81 MG tablet Take 81 mg by mouth 2 (two) times a week. On sundays and thursdays   Yes [provider]  atenolol (TENORMIN) 50 MG tablet Take 75 mg by mouth 2 (two) times daily.    Yes [provider]  clonazePAM (KLONOPIN) 0.5 MG tablet Take 0.25 mg by mouth 2 (two) times daily.    Yes [provider]  hydrALAZINE (APRESOLINE) 25 MG tablet Take 1 tablet by mouth 3 (three) times daily. 04/21/17  Yes [provider]  hydrochlorothiazide (HYDRODIURIL) 25 MG tablet Take 1 tablet by mouth daily. 04/30/17  Yes [provider]  lisinopril (PRINIVIL,ZESTRIL) 40 MG tablet Take 1 tablet by mouth daily. 04/21/17  Yes [provider]  b complex vitamins tablet Take 2 tablets by mouth daily.    [provider]  CALCIUM LACTATE PO Take 2 capsules by mouth daily.    [provider]  captopril (CAPOTEN) 100 MG tablet Take 100 mg by mouth 2 (two) times daily.    [provider]  cholecalciferol (VITAMIN D) 1000 UNITS tablet Take 1,000 Units by mouth daily.    [provider]  cloNIDine (CATAPRES) 0.1 MG tablet Take 1 tablet (0.1 mg total) by mouth 3 (three) times daily. 05/01/17 05/01/18  Quale,  Elta Guadeloupe, MD  folic acid (FOLVITE) 1 MG tablet Take 1 mg by mouth daily.    [provider]  loperamide (IMODIUM) 2 MG capsule Take 2 capsules (4 mg total) by mouth as needed for diarrhea or loose stools. Patient not taking: Reported on 05/01/2017 03/17/16   Hower, Aaron Mose, MD  Multiple Vitamin (MULTIVITAMIN) capsule Take 1 capsule by mouth daily.    [provider]  Omega-3 Fatty Acids (FISH OIL) 1200 MG CAPS Take 1 capsule by mouth daily.     [provider]    Allergies Statins  Family History    Problem Relation Age of Onset  . Heart disease Father   . Heart failure Father     Social History Social History  Substance Use Topics  . Smoking status: Never Smoker  . Smokeless tobacco: Not on file  . Alcohol use No    Review of Systems Constitutional: No fever/chills Eyes: No visual changesPresently. ENT: No sore throat. Cardiovascular: Denies chest pain. Respiratory: Denies shortness of breath. Gastrointestinal: No abdominal pain.  No nausea, no vomiting.  No diarrhea.  No constipation. Genitourinary: Negative for dysuria. Musculoskeletal: Negative for back pain. Skin: Negative for rash. Neurological: Negative for headaches, focal weakness or numbness.  10-point ROS otherwise negative.  ____________________________________________   PHYSICAL EXAM:  VITAL SIGNS: ED Triage Vitals  Enc Vitals Group     BP 05/01/17 1100 (!) 202/78     Pulse Rate 05/01/17 1100 86     Resp 05/01/17 1100 20     Temp 05/01/17 1100 98 F (36.7 C)     Temp Source 05/01/17 1100 Oral     SpO2 05/01/17 1100 100 %     Weight 05/01/17 1101 158 lb (71.7 kg)     Height 05/01/17 1101 4\' 11"  (1.499 m)     Head Circumference --      Peak Flow --      Pain Score --      Pain Loc --      Pain Edu? --      Excl. in [place]? --     Constitutional: Alert and oriented. Well appearing and in no acute distress. Eyes: Conjunctivae are normal. PERRL. EOMI. Head: Atraumatic. Nose: No congestion/rhinnorhea. Mouth/Throat: Mucous membranes are moist.  Oropharynx non-erythematous. Neck: No stridor.   Cardiovascular: Normal rate, regular rhythm. Grossly normal heart sounds.  Good peripheral circulation. Respiratory: Normal respiratory effort.  No retractions. Lungs CTAB. Gastrointestinal: Soft and nontender.  Musculoskeletal: No lower extremity tenderness nor edema.  No joint effusions. Neurologic:  Normal speech and language. No gross focal neurologic deficits are appreciated.  5 out of 5 strength in all  extremities with normal sensation. Normal cranial nerve exam. No ataxia.  Skin:  Skin is warm, dry and intact. No rash noted. Psychiatric: Mood and affect are normal. Speech and behavior are normal.  ____________________________________________   LABS (all labs ordered are listed, but only abnormal results are displayed)  Labs Reviewed  BASIC METABOLIC PANEL - Abnormal; Notable for the following:       Result Value   Sodium 129 (*)    Potassium 3.4 (*)    Chloride 92 (*)    Glucose, Bld 113 (*)    All other components within normal limits  CBC - Abnormal; Notable for the following:    WBC 13.1 (*)    RBC 5.42 (*)    All other components within normal limits  TROPONIN I   ____________________________________________  EKG  ED  ECG REPORT I, QUALE, MARK, the attending physician, personally viewed and interpreted this ECG.  Date: 05/01/2017 EKG Time: 1115 Rate: 75 Rhythm: normal sinus rhythm QRS Axis: normal Intervals: normal ST/T Wave abnormalities: normal Conduction Disturbances: none Narrative Interpretation: unremarkable  ____________________________________________  RADIOLOGY  Dg Chest 2 View  Result Date: 05/01/2017 CLINICAL DATA:  Hypertension and dizziness intermittently over the past 3 weeks. No shortness of breath or chest pain. Nonsmoker. History of hypertension and aortic stenosis. EXAM: CHEST  2 VIEW COMPARISON:  Chest x-ray dated March 13, 2016 FINDINGS: The lungs are reasonably well inflated. There is no focal infiltrate. There is no pleural effusion. The heart and pulmonary vascularity are normal. The mediastinum is normal in width. The trachea is midline. 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 There is tortuosity of the descending thoracic aorta. IMPRESSION: There is no acute pneumonia nor pulmonary edema nor other acute cardiopulmonary abnormality. 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 Electronically  Signed   By: David  Martinique M.D.   On: 05/01/2017 11:36    ____________________________________________   PROCEDURES  Procedure(s) performed: None  Procedures  Critical Care performed: No  ____________________________________________   INITIAL IMPRESSION / ASSESSMENT AND PLAN / ED COURSE  Pertinent labs & imaging results that were available during my care of the patient were reviewed by me and considered in my medical decision making (see chart for details).  Patient rinse for evaluation of hypertension with associated feeling of lightheadedness/occasional blurring of vision. Now completely neurologically intact and the symptoms have resolved, but reports it, off and on for about 3 weeks now.  EKG is normal, laboratory analysis there is rates a slight hyponatremia which is likely associated with her initiation of hydrochlorothiazide 3 weeks ago. She does not appear symptomatic to this. After taking her lunch/noontime blood pressure medicines her blood pressure resolved itself here and she appears calm and comfortable. I question if there may be an element of whitecoat hypertension, versus ongoing elevated blood pressures consistently. Somewhat hard to tell. Discussed with Dr. Clayborn Bigness of cardiology who advised increasing the patient's clonidine dose to 0.2 mg 3 times a day, however given her blood pressure improved itself here I further discussed the patient and we will keep her current regimen but she can use and occasional when necessary clonidine 0.1 mg every 8 hours as needed. She'll follow up closely with Dr. Alveria Apley clinic this coming week. Also discussed obtaining a renal ultrasound to rule out stenosis, patient is agreeable with this and also having her sodium rechecked in the clinic with Dr. Nehemiah Massed next week.  Return precautions and treatment recommendations and follow-up discussed with the patient who is agreeable with the plan.        ____________________________________________   FINAL CLINICAL IMPRESSION(S) / ED DIAGNOSES  Final diagnoses:  Hypertensive urgency  Hyponatremia      NEW MEDICATIONS STARTED DURING THIS VISIT:  Discharge Medication List as of 05/01/2017  1:41 PM    START taking these medications   Details  cloNIDine (CATAPRES) 0.1 MG tablet Take 1 tablet (0.1 mg total) by mouth 3 (three) times daily., Starting Thu 05/01/2017, Until Fri 05/01/2018, Print         Note:  This document was prepared using Dragon voice recognition software and may include unintentional dictation errors.     Delman Kitten, MD 05/01/17 [phone]

## 2017-05-01 NOTE — Discharge Instructions (Signed)
As we discussed, though you do have high blood pressure (hypertension), fortunately it is not immediately dangerous at this time and does not need emergency intervention or admission to the hospital.  You do need further follow-up, including follow-up with Dr. Nehemiah Massed or his clinic within a few days.  We also recommend you have an ultrasound performed through your doctor of your renal arteries.  Return to the Emergency Department (ED) if you experience any chest pain/pressure/tightness, difficulty breathing, or sudden sweating, or other symptoms that concern you.

## 2020-03-16 ENCOUNTER — Inpatient Hospital Stay
Admission: EM | Admit: 2020-03-16 | Discharge: 2020-03-28 | DRG: 698 | Disposition: A | Discharge status: Home / Self Care | Payer: Medicare HMO | Attending: Internal Medicine | Admitting: Internal Medicine

## 2020-03-16 ENCOUNTER — Inpatient Hospital Stay: Payer: Medicare HMO

## 2020-03-16 ENCOUNTER — Other Ambulatory Visit: Payer: Self-pay

## 2020-03-16 ENCOUNTER — Emergency Department: Payer: Medicare HMO

## 2020-03-16 DIAGNOSIS — E871 Hypo-osmolality and hyponatremia: Secondary | ICD-10-CM | POA: Diagnosis present

## 2020-03-16 DIAGNOSIS — Z85828 Personal history of other malignant neoplasm of skin: Secondary | ICD-10-CM | POA: Diagnosis not present

## 2020-03-16 DIAGNOSIS — J810 Acute pulmonary edema: Secondary | ICD-10-CM

## 2020-03-16 DIAGNOSIS — E7800 Pure hypercholesterolemia, unspecified: Secondary | ICD-10-CM | POA: Diagnosis present

## 2020-03-16 DIAGNOSIS — Z8249 Family history of ischemic heart disease and other diseases of the circulatory system: Secondary | ICD-10-CM | POA: Diagnosis not present

## 2020-03-16 DIAGNOSIS — I5032 Chronic diastolic (congestive) heart failure: Secondary | ICD-10-CM | POA: Diagnosis not present

## 2020-03-16 DIAGNOSIS — E876 Hypokalemia: Secondary | ICD-10-CM | POA: Diagnosis present

## 2020-03-16 DIAGNOSIS — R7303 Prediabetes: Secondary | ICD-10-CM | POA: Diagnosis present

## 2020-03-16 DIAGNOSIS — Z7982 Long term (current) use of aspirin: Secondary | ICD-10-CM | POA: Diagnosis not present

## 2020-03-16 DIAGNOSIS — N2581 Secondary hyperparathyroidism of renal origin: Secondary | ICD-10-CM | POA: Diagnosis present

## 2020-03-16 DIAGNOSIS — N179 Acute kidney failure, unspecified: Secondary | ICD-10-CM | POA: Diagnosis not present

## 2020-03-16 DIAGNOSIS — J9601 Acute respiratory failure with hypoxia: Secondary | ICD-10-CM | POA: Diagnosis present

## 2020-03-16 DIAGNOSIS — R739 Hyperglycemia, unspecified: Secondary | ICD-10-CM | POA: Diagnosis present

## 2020-03-16 DIAGNOSIS — D631 Anemia in chronic kidney disease: Secondary | ICD-10-CM | POA: Diagnosis present

## 2020-03-16 DIAGNOSIS — I350 Nonrheumatic aortic (valve) stenosis: Secondary | ICD-10-CM | POA: Diagnosis present

## 2020-03-16 DIAGNOSIS — R0602 Shortness of breath: Secondary | ICD-10-CM | POA: Diagnosis not present

## 2020-03-16 DIAGNOSIS — I509 Heart failure, unspecified: Secondary | ICD-10-CM | POA: Diagnosis not present

## 2020-03-16 DIAGNOSIS — N057 Unspecified nephritic syndrome with diffuse crescentic glomerulonephritis: Secondary | ICD-10-CM | POA: Diagnosis present

## 2020-03-16 DIAGNOSIS — T380X5A Adverse effect of glucocorticoids and synthetic analogues, initial encounter: Secondary | ICD-10-CM | POA: Diagnosis present

## 2020-03-16 DIAGNOSIS — K121 Other forms of stomatitis: Secondary | ICD-10-CM | POA: Diagnosis not present

## 2020-03-16 DIAGNOSIS — D638 Anemia in other chronic diseases classified elsewhere: Secondary | ICD-10-CM

## 2020-03-16 DIAGNOSIS — Z888 Allergy status to other drugs, medicaments and biological substances status: Secondary | ICD-10-CM | POA: Diagnosis not present

## 2020-03-16 DIAGNOSIS — Y92239 Unspecified place in hospital as the place of occurrence of the external cause: Secondary | ICD-10-CM | POA: Diagnosis present

## 2020-03-16 DIAGNOSIS — N1832 Chronic kidney disease, stage 3b: Secondary | ICD-10-CM | POA: Diagnosis present

## 2020-03-16 DIAGNOSIS — N170 Acute kidney failure with tubular necrosis: Secondary | ICD-10-CM | POA: Diagnosis present

## 2020-03-16 DIAGNOSIS — Z8616 Personal history of COVID-19: Secondary | ICD-10-CM | POA: Diagnosis not present

## 2020-03-16 DIAGNOSIS — I10 Essential (primary) hypertension: Secondary | ICD-10-CM | POA: Diagnosis not present

## 2020-03-16 DIAGNOSIS — I5031 Acute diastolic (congestive) heart failure: Secondary | ICD-10-CM | POA: Diagnosis present

## 2020-03-16 DIAGNOSIS — I130 Hypertensive heart and chronic kidney disease with heart failure and stage 1 through stage 4 chronic kidney disease, or unspecified chronic kidney disease: Secondary | ICD-10-CM | POA: Diagnosis present

## 2020-03-16 DIAGNOSIS — Z79899 Other long term (current) drug therapy: Secondary | ICD-10-CM

## 2020-03-16 DIAGNOSIS — N058 Unspecified nephritic syndrome with other morphologic changes: Secondary | ICD-10-CM | POA: Diagnosis not present

## 2020-03-16 DIAGNOSIS — F419 Anxiety disorder, unspecified: Secondary | ICD-10-CM | POA: Diagnosis present

## 2020-03-16 DIAGNOSIS — E538 Deficiency of other specified B group vitamins: Secondary | ICD-10-CM | POA: Diagnosis present

## 2020-03-16 DIAGNOSIS — I160 Hypertensive urgency: Secondary | ICD-10-CM | POA: Diagnosis present

## 2020-03-16 DIAGNOSIS — N009 Acute nephritic syndrome with unspecified morphologic changes: Secondary | ICD-10-CM

## 2020-03-16 DIAGNOSIS — D72829 Elevated white blood cell count, unspecified: Secondary | ICD-10-CM | POA: Diagnosis present

## 2020-03-16 DIAGNOSIS — M3131 Wegener's granulomatosis with renal involvement: Secondary | ICD-10-CM | POA: Diagnosis present

## 2020-03-16 DIAGNOSIS — N059 Unspecified nephritic syndrome with unspecified morphologic changes: Secondary | ICD-10-CM

## 2020-03-16 DIAGNOSIS — K5900 Constipation, unspecified: Secondary | ICD-10-CM | POA: Diagnosis not present

## 2020-03-16 DIAGNOSIS — E785 Hyperlipidemia, unspecified: Secondary | ICD-10-CM | POA: Diagnosis present

## 2020-03-16 DIAGNOSIS — M3130 Wegener's granulomatosis without renal involvement: Secondary | ICD-10-CM

## 2020-03-16 DIAGNOSIS — R0600 Dyspnea, unspecified: Secondary | ICD-10-CM | POA: Diagnosis present

## 2020-03-16 LAB — TROPONIN I (HIGH SENSITIVITY)
Troponin I (High Sensitivity): 20 ng/L — ABNORMAL HIGH (ref <18)
Troponin I (High Sensitivity): 135 ng/L (ref <18)

## 2020-03-16 LAB — CBC
HCT: 32 % — ABNORMAL LOW (ref 36.0–46.0)
Hemoglobin: 11.1 g/dL — ABNORMAL LOW (ref 12.0–15.0)
MCH: 28.8 pg (ref 26.0–34.0)
MCHC: 34.7 g/dL (ref 30.0–36.0)
MCV: 82.9 fL (ref 80.0–100.0)
Platelets: 408 10*3/uL — ABNORMAL HIGH (ref 150–400)
RBC: 3.86 MIL/uL — ABNORMAL LOW (ref 3.87–5.11)
RDW: 12.5 % (ref 11.5–15.5)
WBC: 18.7 10*3/uL — ABNORMAL HIGH (ref 4.0–10.5)
nRBC: 0 % (ref 0.0–0.2)

## 2020-03-16 LAB — URINALYSIS, COMPLETE (UACMP) WITH MICROSCOPIC
Bacteria, UA: NONE SEEN
Bilirubin Urine: NEGATIVE
Glucose, UA: NEGATIVE mg/dL
Ketones, ur: NEGATIVE mg/dL
Nitrite: NEGATIVE
Protein, ur: >300 mg/dL — AB
Specific Gravity, Urine: 1.015 (ref 1.005–1.030)
pH: 6 (ref 5.0–8.0)

## 2020-03-16 LAB — BASIC METABOLIC PANEL
Anion gap: 11 (ref 5–15)
BUN: 24 mg/dL — ABNORMAL HIGH (ref 8–23)
CO2: 24 mmol/L (ref 22–32)
Calcium: 8.8 mg/dL — ABNORMAL LOW (ref 8.9–10.3)
Chloride: 93 mmol/L — ABNORMAL LOW (ref 98–111)
Creatinine, Ser: 1.94 mg/dL — ABNORMAL HIGH (ref 0.44–1.00)
GFR calc Af Amer: 28 mL/min — ABNORMAL LOW (ref >60)
GFR calc non Af Amer: 24 mL/min — ABNORMAL LOW (ref >60)
Glucose, Bld: 148 mg/dL — ABNORMAL HIGH (ref 70–99)
Potassium: 3.5 mmol/L (ref 3.5–5.1)
Sodium: 128 mmol/L — ABNORMAL LOW (ref 135–145)

## 2020-03-16 MED ORDER — FUROSEMIDE 10 MG/ML IJ SOLN
40.0000 mg | Freq: Once | INTRAMUSCULAR | Status: AC
  Administered 2020-03-16: 40 mg via INTRAVENOUS
  Filled 2020-03-16: qty 4

## 2020-03-16 MED ORDER — TRAMADOL HCL 50 MG PO TABS: 50.0000 mg | ORAL_TABLET | Freq: Four times a day (QID) | ORAL | Status: DC | PRN

## 2020-03-16 MED ORDER — ENOXAPARIN SODIUM 40 MG/0.4ML ~~LOC~~ SOLN
30.0000 mg | SUBCUTANEOUS | Status: DC
  Filled 2020-03-16 (×3): qty 0.4

## 2020-03-16 MED ORDER — CARVEDILOL 6.25 MG PO TABS
6.2500 mg | ORAL_TABLET | Freq: Two times a day (BID) | ORAL | Status: DC
  Administered 2020-03-17: 6.25 mg via ORAL
  Filled 2020-03-16: qty 1

## 2020-03-16 MED ORDER — ATENOLOL 25 MG PO TABS: 75.0000 mg | ORAL_TABLET | Freq: Two times a day (BID) | ORAL | Status: DC

## 2020-03-16 MED ORDER — BISACODYL 5 MG PO TBEC: 5.0000 mg | DELAYED_RELEASE_TABLET | Freq: Every day | ORAL | Status: DC | PRN

## 2020-03-16 MED ORDER — HYDRALAZINE HCL 50 MG PO TABS
50.0000 mg | ORAL_TABLET | Freq: Three times a day (TID) | ORAL | Status: DC
  Administered 2020-03-16 – 2020-03-22 (×17): 50 mg via ORAL
  Filled 2020-03-16 (×17): qty 1

## 2020-03-16 MED ORDER — HYDRALAZINE HCL 20 MG/ML IJ SOLN
10.0000 mg | Freq: Once | INTRAMUSCULAR | Status: AC
  Administered 2020-03-16: 10 mg via INTRAVENOUS
  Filled 2020-03-16: qty 1

## 2020-03-16 MED ORDER — ENOXAPARIN SODIUM 30 MG/0.3ML ~~LOC~~ SOLN
30.0000 mg | SUBCUTANEOUS | Status: DC
  Administered 2020-03-16 – 2020-03-20 (×4): 30 mg via SUBCUTANEOUS
  Filled 2020-03-16 (×4): qty 0.3

## 2020-03-16 MED ORDER — ACETAMINOPHEN 325 MG PO TABS
650.0000 mg | ORAL_TABLET | Freq: Four times a day (QID) | ORAL | Status: DC | PRN
  Administered 2020-03-20 – 2020-03-21 (×3): 650 mg via ORAL
  Filled 2020-03-16 (×3): qty 2

## 2020-03-16 MED ORDER — CLONIDINE HCL 0.1 MG PO TABS
0.1000 mg | ORAL_TABLET | Freq: Three times a day (TID) | ORAL | Status: DC
  Administered 2020-03-16 – 2020-03-26 (×28): 0.1 mg via ORAL
  Filled 2020-03-16 (×29): qty 1

## 2020-03-16 MED ORDER — CLONAZEPAM 0.5 MG PO TABS
0.2500 mg | ORAL_TABLET | Freq: Two times a day (BID) | ORAL | Status: DC
  Administered 2020-03-16 – 2020-03-28 (×23): 0.25 mg via ORAL
  Filled 2020-03-16 (×23): qty 1

## 2020-03-16 MED ORDER — ACETAMINOPHEN 650 MG RE SUPP: 650.0000 mg | Freq: Four times a day (QID) | RECTAL | Status: DC | PRN

## 2020-03-16 MED ORDER — FUROSEMIDE 10 MG/ML IJ SOLN
40.0000 mg | Freq: Every day | INTRAMUSCULAR | Status: DC
  Administered 2020-03-17: 40 mg via INTRAVENOUS
  Filled 2020-03-16: qty 4

## 2020-03-16 MED ORDER — HYDRALAZINE HCL 20 MG/ML IJ SOLN
20.0000 mg | Freq: Four times a day (QID) | INTRAMUSCULAR | Status: DC | PRN
  Filled 2020-03-16: qty 1

## 2020-03-16 MED ORDER — ASPIRIN EC 81 MG PO TBEC
81.0000 mg | DELAYED_RELEASE_TABLET | Freq: Every day | ORAL | Status: DC
  Administered 2020-03-16 – 2020-03-20 (×5): 81 mg via ORAL
  Filled 2020-03-16 (×5): qty 1

## 2020-03-16 MED ORDER — SODIUM CHLORIDE 0.9% FLUSH: 3.0000 mL | Freq: Once | INTRAVENOUS | Status: DC

## 2020-03-16 MED ORDER — SODIUM CHLORIDE 0.9% FLUSH
3.0000 mL | Freq: Two times a day (BID) | INTRAVENOUS | Status: DC
  Administered 2020-03-16 – 2020-03-28 (×21): 3 mL via INTRAVENOUS

## 2020-03-16 MED ORDER — VITAMIN D 25 MCG (1000 UNIT) PO TABS
1000.0000 [IU] | ORAL_TABLET | Freq: Every day | ORAL | Status: DC
  Filled 2020-03-16: qty 1

## 2020-03-16 NOTE — ED Notes (Signed)
Date and time results received: 03/16/20 1838  Test: troponin Critical Value: 135  Name of Provider Notified: dr Jimmye Norman

## 2020-03-16 NOTE — ED Provider Notes (Signed)
[facility] Emergency Department Provider Note  ____________________________________________  Time seen: Approximately 5:15 PM  I have reviewed the triage vital signs and the nursing notes.   HISTORY  Chief Complaint Shortness of Breath and Hypertension    HPI Linda Bradley is a 79 y.o. female with a history of anxiety, aortic stenosis, hypertension who comes the ED complaining of shortness of breath since yesterday.  She has been feeling sick for the past 2 weeks with fatigue, decreased appetite, decreased oral intake.  States that she has felt too weak to get out of bed.  Denies chest pain.  Shortness of breath that started yesterday and has been constant, worsening, no aggravating or alleviating factors.  She does note that she had Covid in January.  She was also transitioned off of  HCTZ due to rising creatinine, had atenolol changed to carvedilol and has noticed increased blood pressure since then.  Patient also complains of posterior headache that has been gradual onset but constant, waxing and waning, nonradiating.  She does report falling at home and hitting her head on a table 2 weeks ago.     Past Medical History:  Diagnosis Date  . Anxiety disorder   . Dermatitis   . Heart murmur   . Hypercholesterolemia   . Hypertension   . Lymphadenitis   . Skin cancer   . Spontaneous ecchymoses   . Vitamin D deficiency      Patient Active Problem List   Diagnosis Date Noted  . Dyspnea 03/16/2020  . Hypokalemia 03/14/2016  . Hyponatremia 03/14/2016  . Aortic stenosis, mild 05/26/2015  . Abnormal EKG 04/14/2015  . Dyspnea on exertion 04/14/2015  . Accelerated essential hypertension 04/14/2015     Past Surgical History:  Procedure Laterality Date  . CARPAL TUNNEL RELEASE     [facility]  . TONSILLECTOMY    . TRANSTHORACIC ECHOCARDIOGRAM  05/01/2015    EF 60-65%. Abnormal relaxation. Mild aortic stenosis (peak gradient 19 mmHg).     Prior to  Admission medications   Medication Sig Start Date End Date Taking? Authorizing Provider  aspirin 81 MG tablet Take 81 mg by mouth 2 (two) times a week. On sundays and thursdays    [provider]  atenolol (TENORMIN) 50 MG tablet Take 75 mg by mouth 2 (two) times daily.     [provider]  b complex vitamins tablet Take 2 tablets by mouth daily.    [provider]  CALCIUM LACTATE PO Take 2 capsules by mouth daily.    [provider]  captopril (CAPOTEN) 100 MG tablet Take 100 mg by mouth 2 (two) times daily.    [provider]  cholecalciferol (VITAMIN D) 1000 UNITS tablet Take 1,000 Units by mouth daily.    [provider]  clonazePAM (KLONOPIN) 0.5 MG tablet Take 0.25 mg by mouth 2 (two) times daily.     [provider]  cloNIDine (CATAPRES) 0.1 MG tablet Take 1 tablet (0.1 mg total) by mouth 3 (three) times daily. 05/01/17 05/01/18  Delman Kitten, MD  folic acid (FOLVITE) 1 MG tablet Take 1 mg by mouth daily.    [provider]  hydrALAZINE (APRESOLINE) 25 MG tablet Take 1 tablet by mouth 3 (three) times daily. 04/21/17   [provider]  hydrochlorothiazide (HYDRODIURIL) 25 MG tablet Take 1 tablet by mouth daily. 04/30/17   [provider]  lisinopril (PRINIVIL,ZESTRIL) 40 MG tablet Take 1 tablet by mouth daily. 04/21/17   [provider]  loperamide (IMODIUM) 2 MG capsule Take 2 capsules (4 mg total) by mouth as needed for diarrhea or loose stools. Patient not taking: Reported on 05/01/2017 03/17/16   Hower, Aaron Mose, MD  Multiple Vitamin (MULTIVITAMIN) capsule Take 1 capsule by mouth daily.    [provider]  Omega-3 Fatty Acids (FISH OIL) 1200 MG CAPS Take 1 capsule by mouth daily.     [provider]     Allergies Statins   Family History  Problem Relation Age of Onset  . Heart disease Father   . Heart failure Father     Social History Social History   Tobacco Use  .  Smoking status: Never Smoker  Substance Use Topics  . Alcohol use: No  . Drug use: No    Review of Systems  Constitutional:   No fever or chills.  Positive malaise ENT:   No sore throat. No rhinorrhea. Cardiovascular:   No chest pain or syncope. Respiratory:   Positive shortness of breath and nonproductive cough. Gastrointestinal:   Negative for abdominal pain, vomiting and diarrhea.  Musculoskeletal:   Negative for focal pain or swelling All other systems reviewed and are negative except as documented above in ROS and HPI.  ____________________________________________   PHYSICAL EXAM:  VITAL SIGNS: ED Triage Vitals  Enc Vitals Group     BP 03/16/20 1518 (!) 207/97     Pulse Rate 03/16/20 1518 (!) 109     Resp 03/16/20 1518 (!) 22     Temp 03/16/20 1518 99.2 F (37.3 C)     Temp src --      SpO2 03/16/20 1518 91 %     Weight 03/16/20 1520 150 lb (68 kg)     Height 03/16/20 1520 4\' 11"  (1.499 m)     Head Circumference --      Peak Flow --      Pain Score 03/16/20 1520 3     Pain Loc --      Pain Edu? --      Excl. in [place]? --     Vital signs reviewed, nursing assessments reviewed.   Constitutional:   Alert and oriented.  Ill-appearing Eyes:   Conjunctivae are normal. EOMI. PERRL. ENT      Head:   Normocephalic and atraumatic.      Nose:   Wearing a mask.      Mouth/Throat:   Wearing a mask.      Neck:   No meningismus. Full ROM. Hematological/Lymphatic/Immunilogical:   No cervical lymphadenopathy. Cardiovascular:   Tachycardia heart rate 105. Symmetric bilateral radial and DP pulses.  Loud systolic murmur. Cap refill less than 2 seconds. Respiratory: With tachypnea, respiratory rate of about 25.  Diminished breath sounds bilateral bases, inspiratory crackles, faint expiratory wheezing. Gastrointestinal:   Soft and nontender. Non distended. There is no CVA tenderness.  No rebound, rigidity, or guarding.  Musculoskeletal:   Normal range of motion in all extremities.  No joint effusions.  No lower extremity tenderness.  No edema. Neurologic:   Normal speech and language.  Motor grossly intact. No acute focal neurologic deficits are appreciated.  Skin:    Skin is warm, dry and intact. No rash noted.  No petechiae, purpura, or bullae.  ____________________________________________    LABS (pertinent positives/negatives) (all labs ordered are listed, but only abnormal results are displayed) Labs Reviewed  BASIC METABOLIC PANEL - Abnormal; Notable for the following components:      Result Value   Sodium 128 (*)  Chloride 93 (*)    Glucose, Bld 148 (*)    BUN 24 (*)    Creatinine, Ser 1.94 (*)    Calcium 8.8 (*)    GFR calc non Af Amer 24 (*)    GFR calc Af Amer 28 (*)    All other components within normal limits  CBC - Abnormal; Notable for the following components:   WBC 18.7 (*)    RBC 3.86 (*)    Hemoglobin 11.1 (*)    HCT 32.0 (*)    Platelets 408 (*)    All other components within normal limits  TROPONIN I (HIGH SENSITIVITY) - Abnormal; Notable for the following components:   Troponin I (High Sensitivity) 20 (*)    All other components within normal limits  URINE CULTURE  URINALYSIS, COMPLETE (UACMP) WITH MICROSCOPIC  TROPONIN I (HIGH SENSITIVITY)   ____________________________________________   EKG  Interpreted by me Sinus tachycardia rate 106, normal axis and intervals.  Poor R wave progression.  Normal ST segments and T waves.  ____________________________________________    RADIOLOGY  DG Chest 2 View  Result Date: 03/16/2020 CLINICAL DATA:  Shortness of breath. EXAM: CHEST - 2 VIEW COMPARISON:  Radiograph 05/01/2017 FINDINGS: Borderline cardiomegaly. Unchanged mediastinal contours. Small bilateral pleural effusions with fluid in the fissures. Interstitial opacities consistent with pulmonary edema with mild Kerley B-lines. No confluent airspace disease. No pneumothorax. Scoliotic curvature in the spine with associated  degenerative change. Aortic atherosclerosis. IMPRESSION: Findings consistent with CHF. Pulmonary edema with small pleural effusions. Aortic Atherosclerosis (ICD10-I70.0). Electronically Signed   By: Keith Rake M.D.   On: 03/16/2020 15:59    ____________________________________________   PROCEDURES .Critical Care Performed by: Carrie Mew, MD Authorized by: Carrie Mew, MD   Critical care provider statement:    Critical care time (minutes):  35   Critical care time was exclusive of:  Separately billable procedures and treating other patients   Critical care was necessary to treat or prevent imminent or life-threatening deterioration of the following conditions:  Circulatory failure, cardiac failure and respiratory failure   Critical care was time spent personally by me on the following activities:  Development of treatment plan with patient or surrogate, discussions with consultants, evaluation of patient's response to treatment, examination of patient, obtaining history from patient or surrogate, ordering and performing treatments and interventions, ordering and review of laboratory studies, ordering and review of radiographic studies, pulse oximetry, re-evaluation of patient's condition and review of old charts    ____________________________________________    CLINICAL IMPRESSION / ASSESSMENT AND PLAN / ED COURSE  Medications ordered in the ED: Medications  sodium chloride flush (NS) 0.9 % injection 3 mL (3 mLs Intravenous Not Given 03/16/20 1624)  hydrALAZINE (APRESOLINE) injection 20 mg (has no administration in time range)  enoxaparin (LOVENOX) injection 30 mg (has no administration in time range)  sodium chloride flush (NS) 0.9 % injection 3 mL (has no administration in time range)  acetaminophen (TYLENOL) tablet 650 mg (has no administration in time range)    Or  acetaminophen (TYLENOL) suppository 650 mg (has no administration in time range)  traMADol  (ULTRAM) tablet 50 mg (has no administration in time range)  bisacodyl (DULCOLAX) EC tablet 5 mg (has no administration in time range)  atenolol (TENORMIN) tablet 75 mg (has no administration in time range)  cloNIDine (CATAPRES) tablet 0.1 mg (has no administration in time range)  aspirin EC tablet 81 mg (has no administration in time range)  hydrALAZINE (APRESOLINE) injection 10 mg (  10 mg Intravenous Given 03/16/20 1718)  furosemide (LASIX) injection 40 mg (40 mg Intravenous Given 03/16/20 1718)    Pertinent labs & imaging results that were available during my care of the patient were reviewed by me and considered in my medical decision making (see chart for details).  Linda Bradley was evaluated in Emergency Department on 03/16/2020 for the symptoms described in the history of present illness. She was evaluated in the context of the global COVID-19 pandemic, which necessitated consideration that the patient might be at risk for infection with the SARS-CoV-2 virus that causes COVID-19. Institutional protocols and algorithms that pertain to the evaluation of patients at risk for COVID-19 are in a state of rapid change based on information released by regulatory bodies including the CDC and federal and state organizations. These policies and algorithms were followed during the patient's care in the ED.   Patient presents with shortness of breath, chest x-ray showing pulmonary edema which is consistent with the physical exam findings.  Oxygenation is borderline at 90% on room air sitting upright at rest.  Feels better with oxygenation up to 98% on 2 L nasal cannula.  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  Will give IV hydralazine for BP, IV lasix for pulmonary edema/diuresis.  Will avoid beta blocker for now until EF can be assessed.       ____________________________________________   FINAL CLINICAL IMPRESSION(S) /  ED DIAGNOSES    Final diagnoses:  Acute congestive heart failure, unspecified heart failure type ([place])  AKI (acute kidney injury) ([place])  Acute pulmonary edema [facility])     ED Discharge Orders    None      Portions of this note were generated with dragon dictation software. Dictation errors may occur despite best attempts at proofreading.   Carrie Mew, MD 03/16/20 [phone]

## 2020-03-16 NOTE — H&P (Addendum)
History and Physical    ARLYNN STARE ASN:[id number] DOB: Sep 10, 1941 DOA: 03/16/2020  PCP: Lynnell Jude, MD  Patient coming from: home    Chief Complaint: shortness of breath  HPI: 79 y/o F w/ PMH of HTN, anxiety who presents w/ shortness of breath x day of admission. Hx was obtained from pt and pt's daughter who is at bedside. The shortness of breath is at rest as well as with exertion. Of note, pt's cardiologist at [facility] recently changed some of her medications secondary to AKI. Pt was taken off of HCTZ, atenolol and pt was put on coreg approx 3 weeks ago. Since the pt's cardiac meds were changed, pt's BP has been poorly controlled. Pt denies any PMH of CHF or MI. Pt also c/o intermittent chills, dizziness and nausea. Pt denies any fevers, sweating, chest pain, vomiting, cough, abd pain, dysuria, urinary urgency, urinary frequency, diarrhea or constipation.  Review of Systems: As per HPI otherwise 10 point review of systems negative.    Past Medical History:  Diagnosis Date  . Anxiety disorder   . Dermatitis   . Heart murmur   . Hypercholesterolemia   . Hypertension   . Lymphadenitis   . Skin cancer   . Spontaneous ecchymoses   . Vitamin D deficiency     Past Surgical History:  Procedure Laterality Date  . CARPAL TUNNEL RELEASE     [facility]  . TONSILLECTOMY    . TRANSTHORACIC ECHOCARDIOGRAM  05/01/2015    EF 60-65%. Abnormal relaxation. Mild aortic stenosis (peak gradient 19 mmHg).     reports that she has never smoked. She does not have any smokeless tobacco history on file. She reports that she does not drink alcohol or use drugs.  Allergies  Allergen Reactions  . Statins     Other reaction(s): Other (See Comments) Other Reaction: CNS DISORDER    Family History  Problem Relation Age of Onset  . Heart disease Father   . Heart failure Father      Prior to Admission medications   Medication Sig Start Date End Date Taking? Authorizing Provider  aspirin 81 MG  tablet Take 81 mg by mouth 2 (two) times a week. On sundays and thursdays    [provider]  atenolol (TENORMIN) 50 MG tablet Take 75 mg by mouth 2 (two) times daily.     [provider]  b complex vitamins tablet Take 2 tablets by mouth daily.    [provider]  CALCIUM LACTATE PO Take 2 capsules by mouth daily.    [provider]  captopril (CAPOTEN) 100 MG tablet Take 100 mg by mouth 2 (two) times daily.    [provider]  cholecalciferol (VITAMIN D) 1000 UNITS tablet Take 1,000 Units by mouth daily.    [provider]  clonazePAM (KLONOPIN) 0.5 MG tablet Take 0.25 mg by mouth 2 (two) times daily.     [provider]  cloNIDine (CATAPRES) 0.1 MG tablet Take 1 tablet (0.1 mg total) by mouth 3 (three) times daily. 05/01/17 05/01/18  Delman Kitten, MD  folic acid (FOLVITE) 1 MG tablet Take 1 mg by mouth daily.    [provider]  hydrALAZINE (APRESOLINE) 25 MG tablet Take 1 tablet by mouth 3 (three) times daily. 04/21/17   [provider]  hydrochlorothiazide (HYDRODIURIL) 25 MG tablet Take 1 tablet by mouth daily. 04/30/17   [provider]  lisinopril (PRINIVIL,ZESTRIL) 40 MG tablet Take 1 tablet by mouth daily. 04/21/17  [provider]  loperamide (IMODIUM) 2 MG capsule Take 2 capsules (4 mg total) by mouth as needed for diarrhea or loose stools. Patient not taking: Reported on 05/01/2017 03/17/16   Hower, Aaron Mose, MD  Multiple Vitamin (MULTIVITAMIN) capsule Take 1 capsule by mouth daily.    [provider]  Omega-3 Fatty Acids (FISH OIL) 1200 MG CAPS Take 1 capsule by mouth daily.     [provider]    Physical Exam: Vitals:   03/16/20 1520 03/16/20 1620 03/16/20 1630 03/16/20 1709  BP:   (!) 202/101   Pulse:  (!) 110 (!) 103   Resp:  (!) 36 (!) 27   Temp:      SpO2:  90% 98%   Weight: 68 kg   72 kg  Height: 4\' 11"  (1.499 m)       Constitutional: NAD, calm,  comfortable Vitals:   03/16/20 1520 03/16/20 1620 03/16/20 1630 03/16/20 1709  BP:   (!) 202/101   Pulse:  (!) 110 (!) 103   Resp:  (!) 36 (!) 27   Temp:      SpO2:  90% 98%   Weight: 68 kg   72 kg  Height: 4\' 11"  (1.499 m)      Eyes: PERRL, lids and conjunctivae normal ENMT: Mucous membranes are moist. Posterior pharynx clear of any exudate or lesions Neck: normal, supple Respiratory: diminished breath sounds b/l. No wheezes or rhonchi Cardiovascular: S1, S2+, no rubs / gallops.  Abdomen: soft, ND, no tenderness. Hypoactive bowel sounds Musculoskeletal: no cyanosis. No joint deformity upper and lower extremities. Good ROM. Skin: no rashes, lesions, ulcers.  Neurologic: CN 2-12 grossly intact. Moves all 4 extremities  Psychiatric: Normal judgment and insight. Alert and oriented x 3. Flat mood and affect      Labs on Admission: I have personally reviewed following labs and imaging studies  CBC: Recent Labs  Lab 03/16/20 1522  WBC 18.7*  HGB 11.1*  HCT 32.0*  MCV 82.9  PLT 390*   Basic Metabolic Panel: Recent Labs  Lab 03/16/20 1522  NA 128*  K 3.5  CL 93*  CO2 24  GLUCOSE 148*  BUN 24*  CREATININE 1.94*  CALCIUM 8.8*   GFR: Estimated Creatinine Clearance: 21 mL/min (A) (by C-G formula based on SCr of 1.94 mg/dL (H)). Liver Function Tests: No results for input(s): AST, ALT, ALKPHOS, BILITOT, PROT, ALBUMIN in the last 168 hours. No results for input(s): LIPASE, AMYLASE in the last 168 hours. No results for input(s): AMMONIA in the last 168 hours. Coagulation Profile: No results for input(s): INR, PROTIME in the last 168 hours. Cardiac Enzymes: No results for input(s): CKTOTAL, CKMB, CKMBINDEX, TROPONINI in the last 168 hours. BNP (last 3 results) No results for input(s): PROBNP in the last 8760 hours. HbA1C: No results for input(s): HGBA1C in the last 72 hours. CBG: No results for input(s): GLUCAP in the last 168 hours. Lipid Profile: No results for  input(s): CHOL, HDL, LDLCALC, TRIG, CHOLHDL, LDLDIRECT in the last 72 hours. Thyroid Function Tests: No results for input(s): TSH, T4TOTAL, FREET4, T3FREE, THYROIDAB in the last 72 hours. Anemia Panel: No results for input(s): VITAMINB12, FOLATE, FERRITIN, TIBC, IRON, RETICCTPCT in the last 72 hours. Urine analysis:    Component Value Date/Time   COLORURINE YELLOW (A) 03/14/2016 0547   APPEARANCEUR HAZY (A) 03/14/2016 0547   LABSPEC 1.013 03/14/2016 0547   PHURINE 7.0 03/14/2016 0547   GLUCOSEU 50 (A) 03/14/2016 0547   HGBUR NEGATIVE  03/14/2016 [place] 03/14/2016 0547   KETONESUR TRACE (A) 03/14/2016 0547   PROTEINUR NEGATIVE 03/14/2016 0547   NITRITE NEGATIVE 03/14/2016 0547   LEUKOCYTESUR 1+ (A) 03/14/2016 0547    Radiological Exams on Admission: DG Chest 2 View  Result Date: 03/16/2020 CLINICAL DATA:  Shortness of breath. EXAM: CHEST - 2 VIEW COMPARISON:  Radiograph 05/01/2017 FINDINGS: Borderline cardiomegaly. Unchanged mediastinal contours. Small bilateral pleural effusions with fluid in the fissures. Interstitial opacities consistent with pulmonary edema with mild Kerley B-lines. No confluent airspace disease. No pneumothorax. Scoliotic curvature in the spine with associated degenerative change. Aortic atherosclerosis. IMPRESSION: Findings consistent with CHF. Pulmonary edema with small pleural effusions. Aortic Atherosclerosis (ICD10-I70.0). Electronically Signed   By: Keith Rake M.D.   On: 03/16/2020 15:59    EKG: Independently reviewed.   Assessment/Plan Active Problems:   * No active hospital problems. *  Dyspnea: possibly secondary to new onset CHF vs flash pulmonary edema. Unknown systolic vs diastolic vs mixed. Continue on IV lasix. Monitor I/Os. Echo ordered. Cardio consulted  HTN urgency: w/ recent hx of uncontrolled HTN. Will continue on coreg, clondine & hydralazine. IV hydralazine prn. Will hold ACE-I secondary to AKI   AKI: baseline  Cr/GFR is unknown. No HCTZ as this was d/c by the pt's outpatient cardiologist at [facility] secondary to AKI. Will hold home dose of ACE-I secondary to AKI. Will continue to monitor   Hyponatremia: etiology unclear. Will continue to monitor   Leukocytosis: reactive vs infection. UA is positive for leukocytes, rare bacteria. Urine cx ordered  Hyperglycemia: no hx of DM but will check HbA1c as pt's BMI 32.0.  Elevated troponins: likely secondary to possible new onset CHF vs flash pulmonary edema   Thrombocytosis: etiology unclear, possibly reactive. Will continue to monitor   Anxiety: severity unknown. Continue on home dose of klonipin   DVT prophylaxis: lovenox Code Status: full Family Communication: discussed pt's care w/ pt's daughter who was bedside and answered her questions Disposition Plan: will likely d/c home when medically stable  Consults called: cardio, Dr. Saralyn Pilar  Admission status: inpatient   Wyvonnia Dusky MD [facility] Pager [phone]   If 7PM-7AM, please contact night-coverage [email or web address]   03/16/2020, 5:15 PM

## 2020-03-16 NOTE — ED Notes (Signed)
Pt assisted to toilet 

## 2020-03-16 NOTE — ED Notes (Signed)
Transported to CT, will draw repeat troponin when returns

## 2020-03-16 NOTE — ED Triage Notes (Signed)
Pt comes via POV from home with c/o SOB, hypertension and chills. Pt states chills and she has been in bed for 2 weeks.  Pt states SOB that started yesterday.  Pt states pain in back of head. Pt denies any CP or blurred vision.

## 2020-03-16 NOTE — ED Notes (Signed)
Pt given meal tray.

## 2020-03-17 ENCOUNTER — Encounter: Payer: Self-pay | Admitting: Internal Medicine

## 2020-03-17 ENCOUNTER — Inpatient Hospital Stay
Admit: 2020-03-17 | Discharge: 2020-03-17 | Disposition: A | Discharge status: Home / Self Care | Payer: Medicare HMO | Attending: Acute Care | Admitting: Acute Care

## 2020-03-17 DIAGNOSIS — I10 Essential (primary) hypertension: Secondary | ICD-10-CM

## 2020-03-17 LAB — BASIC METABOLIC PANEL
Anion gap: 11 (ref 5–15)
BUN: 29 mg/dL — ABNORMAL HIGH (ref 8–23)
CO2: 26 mmol/L (ref 22–32)
Calcium: 8.1 mg/dL — ABNORMAL LOW (ref 8.9–10.3)
Chloride: 95 mmol/L — ABNORMAL LOW (ref 98–111)
Creatinine, Ser: 2.33 mg/dL — ABNORMAL HIGH (ref 0.44–1.00)
GFR calc Af Amer: 23 mL/min — ABNORMAL LOW (ref >60)
GFR calc non Af Amer: 20 mL/min — ABNORMAL LOW (ref >60)
Glucose, Bld: 99 mg/dL (ref 70–99)
Potassium: 3.1 mmol/L — ABNORMAL LOW (ref 3.5–5.1)
Sodium: 132 mmol/L — ABNORMAL LOW (ref 135–145)

## 2020-03-17 LAB — CBC
HCT: 26.4 % — ABNORMAL LOW (ref 36.0–46.0)
Hemoglobin: 9 g/dL — ABNORMAL LOW (ref 12.0–15.0)
MCH: 28.6 pg (ref 26.0–34.0)
MCHC: 34.1 g/dL (ref 30.0–36.0)
MCV: 83.8 fL (ref 80.0–100.0)
Platelets: 307 10*3/uL (ref 150–400)
RBC: 3.15 MIL/uL — ABNORMAL LOW (ref 3.87–5.11)
RDW: 12.6 % (ref 11.5–15.5)
WBC: 10.8 10*3/uL — ABNORMAL HIGH (ref 4.0–10.5)
nRBC: 0 % (ref 0.0–0.2)

## 2020-03-17 LAB — HEMOGLOBIN A1C
Hgb A1c MFr Bld: 5.9 % — ABNORMAL HIGH (ref 4.8–5.6)
Mean Plasma Glucose: 122.63 mg/dL

## 2020-03-17 LAB — BRAIN NATRIURETIC PEPTIDE: B Natriuretic Peptide: 416 pg/mL — ABNORMAL HIGH (ref 0.0–100.0)

## 2020-03-17 LAB — URINE CULTURE: Culture: <10000 — AB

## 2020-03-17 LAB — ECHOCARDIOGRAM COMPLETE
Height: 59 in
Weight: 2539.7 oz

## 2020-03-17 LAB — TROPONIN I (HIGH SENSITIVITY)
Troponin I (High Sensitivity): 75 ng/L — ABNORMAL HIGH (ref <18)
Troponin I (High Sensitivity): 55 ng/L — ABNORMAL HIGH (ref <18)

## 2020-03-17 LAB — MAGNESIUM: Magnesium: 1.5 mg/dL — ABNORMAL LOW (ref 1.7–2.4)

## 2020-03-17 LAB — SARS CORONAVIRUS 2 (TAT 6-24 HRS): SARS Coronavirus 2: NEGATIVE

## 2020-03-17 MED ORDER — POTASSIUM CHLORIDE CRYS ER 20 MEQ PO TBCR
20.0000 meq | EXTENDED_RELEASE_TABLET | Freq: Once | ORAL | Status: AC
  Administered 2020-03-17: 20 meq via ORAL
  Filled 2020-03-17: qty 1

## 2020-03-17 NOTE — Progress Notes (Signed)
PROGRESS NOTE    Linda Bradley  XTG:[id number] DOB: [birth year] DOA: 03/16/2020 PCP: Lynnell Jude, MD       Assessment & Plan:   Active Problems:   Dyspnea  Dyspnea: possibly secondary to new onset CHF vs flash pulmonary edema. Unknown systolic vs diastolic vs mixed. Continue on IV lasix. Monitor I/Os. Echo pending. Cardio following and recs apprec  Acute hypoxic respiratory failure: secondary to above. Continue on supplemental oxygen and wean as tolerated.   HTN urgency: w/ recent hx of uncontrolled HTN. Will continue on clondine & hydralazine. IV hydralazine prn. D/c coreg & plan start metoprolol when exacerbation resolves as per cardio. Will hold ACE-I secondary to AKI   AKI: baseline Cr/GFR is unknown. Cr is trending up today, if this continues to trend up will consult nephro. No HCTZ as this was d/c by the pt's outpatient cardiologist at [facility] secondary to AKI. Will hold home dose of ACE-I secondary to AKI. Will continue to monitor   Hypokalemia: KCl repleated. Will continue to monitor   Hyponatremia: etiology unclear, trending up today. Will continue to monitor   Leukocytosis: reactive vs infection. UA is positive for leukocytes, rare bacteria. Urine cx ordered  Pre-DM: HbA1c 5.9, will discuss w/ pt. BMI 32.0, would benefit from weight loss   Elevated troponins: likely secondary to possible new onset CHF vs flash pulmonary edema   Thrombocytosis: resolved  Anxiety: severity unknown. Continue on home dose of klonipin    DVT prophylaxis: lovenox Code Status: full  Family Communication: discussed w/ pt's daughter who was at bedside and answered her questions Disposition Plan: will likely d/c back home when medically stable    Consultants:  Cardio   Procedures:    Antimicrobials:    Subjective: Pt c/o shortness of breath, improved from day prior.  Objective: Vitals:   03/16/20 2300 03/17/20 0100 03/17/20 0500 03/17/20 0528  BP: (!) 120/55 (!)  119/59 (!) 132/59   Pulse: 86 82 80 81  Resp: (!) 24 (!) 23 (!) 23 (!) 21  Temp: 98.3 F (36.8 C)   98.2 F (36.8 C)  TempSrc: Oral   Oral  SpO2: 95% 94% 95% 94%  Weight:      Height:        Intake/Output Summary (Last 24 hours) at 03/17/2020 0834 Last data filed at 03/17/2020 0156 Gross per 24 hour  Intake --  Output 850 ml  Net -850 ml   Filed Weights   03/16/20 1520 03/16/20 1709  Weight: 68 kg 72 kg    Examination:  General exam: Appears calm and comfortable  Respiratory system: diminished breath sounds b/l/ No wheezes Cardiovascular system: S1 & S2 +. No  rubs, gallops or clicks. B/L LE edema Gastrointestinal system: Abdomen is nondistended, soft and nontender.Normal bowel sounds heard. Central nervous system: Alert and oriented. Moves all 4 extremities  Psychiatry: Judgement and insight appear normal. Flat mood and affect     Data Reviewed: I have personally reviewed following labs and imaging studies  CBC: Recent Labs  Lab 03/16/20 1522 03/17/20 0531  WBC 18.7* 10.8*  HGB 11.1* 9.0*  HCT 32.0* 26.4*  MCV 82.9 83.8  PLT 408* 270   Basic Metabolic Panel: Recent Labs  Lab 03/16/20 1522 03/17/20 0531  NA 128* 132*  K 3.5 3.1*  CL 93* 95*  CO2 24 26  GLUCOSE 148* 99  BUN 24* 29*  CREATININE 1.94* 2.33*  CALCIUM 8.8* 8.1*   GFR: Estimated Creatinine Clearance: 17.5 mL/min (A) (by  C-G formula based on SCr of 2.33 mg/dL (H)). Liver Function Tests: No results for input(s): AST, ALT, ALKPHOS, BILITOT, PROT, ALBUMIN in the last 168 hours. No results for input(s): LIPASE, AMYLASE in the last 168 hours. No results for input(s): AMMONIA in the last 168 hours. Coagulation Profile: No results for input(s): INR, PROTIME in the last 168 hours. Cardiac Enzymes: No results for input(s): CKTOTAL, CKMB, CKMBINDEX, TROPONINI in the last 168 hours. BNP (last 3 results) No results for input(s): PROBNP in the last 8760 hours. HbA1C: No results for input(s):  HGBA1C in the last 72 hours. CBG: No results for input(s): GLUCAP in the last 168 hours. Lipid Profile: No results for input(s): CHOL, HDL, LDLCALC, TRIG, CHOLHDL, LDLDIRECT in the last 72 hours. Thyroid Function Tests: No results for input(s): TSH, T4TOTAL, FREET4, T3FREE, THYROIDAB in the last 72 hours. Anemia Panel: No results for input(s): VITAMINB12, FOLATE, FERRITIN, TIBC, IRON, RETICCTPCT in the last 72 hours. Sepsis Labs: No results for input(s): PROCALCITON, LATICACIDVEN in the last 168 hours.  Recent Results (from the past 240 hour(s))  SARS CORONAVIRUS 2 (TAT 6-24 HRS) Nasopharyngeal Nasopharyngeal Swab     Status: None   Collection Time: 03/16/20  6:00 PM   Specimen: Nasopharyngeal Swab  Result Value Ref Range Status   SARS Coronavirus 2 NEGATIVE NEGATIVE Final    Comment: (NOTE) SARS-CoV-2 target nucleic acids are NOT DETECTED. The SARS-CoV-2 RNA is generally detectable in upper and lower respiratory specimens during the acute phase of infection. Negative results do not preclude SARS-CoV-2 infection, do not rule out co-infections with other pathogens, and should not be used as the sole basis for treatment or other patient management decisions. Negative results must be combined with clinical observations, patient history, and epidemiological information. The expected result is Negative. Fact Sheet for Patients: SugarRoll.be Fact Sheet for Healthcare Providers: https://www.woods-mathews.com/ This test is not yet approved or cleared by the [place] FDA and  has been authorized for detection and/or diagnosis of SARS-CoV-2 by FDA under an Emergency Use Authorization (EUA). This EUA will remain  in effect (meaning this test can be used) for the duration of the COVID-19 declaration under Section 56 4(b)(1) of the Act, 21 U.S.C. section 360bbb-3(b)(1), unless the authorization is terminated or revoked sooner. Performed at [facility], [place] [address] [place], [place] [address]          Radiology Studies: DG Chest 2 View  Result Date: 03/16/2020 CLINICAL DATA:  Shortness of breath. EXAM: CHEST - 2 VIEW COMPARISON:  Radiograph 05/01/2017 FINDINGS: Borderline cardiomegaly. Unchanged mediastinal contours. Small bilateral pleural effusions with fluid in the fissures. Interstitial opacities consistent with pulmonary edema with mild Kerley B-lines. No confluent airspace disease. No pneumothorax. Scoliotic curvature in the spine with associated degenerative change. Aortic atherosclerosis. IMPRESSION: Findings consistent with CHF. Pulmonary edema with small pleural effusions. Aortic Atherosclerosis (ICD10-I70.0). Electronically Signed   By: Keith Rake M.D.   On: 03/16/2020 15:59   CT HEAD WO CONTRAST  Result Date: 03/16/2020 CLINICAL DATA:  Shortness of breath, chills and posterior headache. EXAM: CT HEAD WITHOUT CONTRAST TECHNIQUE: Contiguous axial images were obtained from the base of the skull through the vertex without intravenous contrast. COMPARISON:  None. FINDINGS: Brain: There is mild cerebral atrophy with widening of the extra-axial spaces and ventricular dilatation. There are areas of decreased attenuation within the white matter tracts of the supratentorial brain, consistent with microvascular disease changes. Vascular: No hyperdense vessel or unexpected calcification. Skull: Normal. Negative for  fracture or focal lesion. Sinuses/Orbits: No acute finding. Other: None. IMPRESSION: 1. Generalized cerebral atrophy. 2. No acute intracranial abnormality. Electronically Signed   By: Virgina Norfolk M.D.   On: 03/16/2020 17:46        Scheduled Meds: . aspirin EC  81 mg Oral Daily  . carvedilol  6.25 mg Oral BID WC  . clonazePAM  0.25 mg Oral BID  . cloNIDine  0.1 mg Oral TID  . enoxaparin (LOVENOX) injection  30 mg Subcutaneous Q24H  . furosemide  40 mg Intravenous Daily  . hydrALAZINE  50 mg Oral  Q8H  . potassium chloride  20 mEq Oral Once  . sodium chloride flush  3 mL Intravenous Once  . sodium chloride flush  3 mL Intravenous Q12H   Continuous Infusions:   LOS: 1 day    Time spent: 33 mins    Wyvonnia Dusky, MD [facility] Pager [phone]  If 7PM-7AM, please contact night-coverage [email or web address] 03/17/2020, 8:34 AM  '

## 2020-03-17 NOTE — Evaluation (Signed)
Occupational Therapy Evaluation Patient Details Name: Linda Bradley MRN: [id number] DOB: [birth year] Today's Date: 03/17/2020    History of Present Illness Per MD note: Linda Bradley is a 79 y.o. female with a history of anxiety, aortic stenosis, hypertension who comes the ED complaining of shortness of breath since yesterday.  She has been feeling sick for the past 2 weeks with fatigue, decreased appetite, decreased oral intake.  States that she has felt too weak to get out of bed.  Shortness of breath that started yesterday and has been constant, worsening, no aggravating or alleviating factors. Of note: COVID in January. Pt also with HTN.   Clinical Impression   Pt was seen for OT evaluation this date. Prior to hospital admission, pt was Indep with all aspects of self care I/ADLs and ADL mobility with no AD as well as driving and getting her own groceries. Pt lives in [facility] with 4 STE and her son lives with her, but does work varying shifts. Currently pt demonstrates impairments as described below (See OT problem list) which functionally limit her ability to perform ADL/self-care tasks. Pt currently requires MOD A with LB dressing and CGA to SBA with ADL transfers/mobility. Pt also noted to have increase in RR with fxl activity to 33 breaths per minute and O2 sat decrease from 97% to 93% on 2Lnc (not tested on RA at this time).  Pt would benefit from skilled OT to address noted impairments and functional limitations (see below for any additional details) in order to maximize safety and independence while minimizing falls risk and caregiver burden. Upon hospital discharge, do not anticipate that pt will require OT f/u.     Follow Up Recommendations  No OT follow up    Equipment Recommendations  None recommended by OT    Recommendations for Other Services       Precautions / Restrictions Precautions Precautions: Fall Restrictions Weight Bearing Restrictions: No Other Position/Activity  Restrictions: on 2Lnc, RR increase with fxl activity.      Mobility Bed Mobility Overal bed mobility: Modified Independent             General bed mobility comments: HOB elevated and use of bed rail  Transfers Overall transfer level: Needs assistance Equipment used: 1 person hand held assist Transfers: Sit to/from Stand Sit to Stand: Supervision;Min guard         General transfer comment: HHA for initial sit to stand, but on subsequent trial, pt stands with no assist, no AD, Supv only. Demos G balance    Balance Overall balance assessment: Mild deficits observed, not formally tested                                         ADL either performed or assessed with clinical judgement   ADL                                         General ADL Comments: Pt able to complete all seated UB ADLs with Indep. Requires MOD A for LB dressing to don socks (appears to primarily be related to bed height). Pt completes ADL transfer with CGA to SBA with no AD.     Vision Baseline Vision/History: Wears glasses Wears Glasses: Reading only Patient Visual Report: No change from baseline  Perception     Praxis      Pertinent Vitals/Pain Pain Assessment: No/denies pain     Hand Dominance     Extremity/Trunk Assessment Upper Extremity Assessment Upper Extremity Assessment: Overall WFL for tasks assessed   Lower Extremity Assessment Lower Extremity Assessment: Defer to PT evaluation;Overall [facility] for tasks assessed   Cervical / Trunk Assessment Cervical / Trunk Assessment: Normal   Communication Communication Communication: No difficulties   Cognition Arousal/Alertness: Awake/alert Behavior During Therapy: WFL for tasks assessed/performed Overall Cognitive Status: Within Functional Limits for tasks assessed                                     General Comments       Exercises Other Exercises Other Exercises: OT  facilitates education re: role of OT in acute setting. Pt and dtr who is present throughout verbalize understanding.   Shoulder Instructions      Home Living Family/patient expects to be discharged to:: Private residence Living Arrangements: Children Available Help at Discharge: Family;Available PRN/intermittently(son works, able to help some. Has dtr in [place] that visits.) Type of Home: House Home Access: Stairs to enter [employer] of Steps: 3-4 in front, 4 STE back entrance Entrance Stairs-Rails: Can reach both Home Layout: One level     Bathroom Shower/Tub: Occupational psychologist: Standard Bathroom Accessibility: No   Home Equipment: Environmental consultant - 2 wheels;Cane - single point          Prior Functioning/Environment Level of Independence: Independent        Comments: Pt states she does not use SPC or walker. +driving. Was working out before [employer]. Takes regulr walks up/down her driveway for exercise.        OT Problem List: Decreased activity tolerance;Cardiopulmonary status limiting activity      OT Treatment/Interventions: Self-care/ADL training;Therapeutic exercise;Energy conservation;DME and/or AE instruction;Therapeutic activities;Patient/family education;Balance training    OT Goals(Current goals can be found in the care plan section) Acute Rehab OT Goals Patient Stated Goal: to get my breathing under control and go home OT Goal Formulation: With patient/family Time For Goal Achievement: 03/31/20 Potential to Achieve Goals: Good  OT Frequency: Min 1X/week   Barriers to D/C:            Co-evaluation              AM-PAC OT "6 Clicks" Daily Activity     Outcome Measure Help from another person eating meals?: None Help from another person taking care of personal grooming?: None Help from another person toileting, which includes using toliet, bedpan, or urinal?: A Little Help from another person bathing (including washing, rinsing,  drying)?: A Little Help from another person to put on and taking off regular upper body clothing?: None Help from another person to put on and taking off regular lower body clothing?: A Lot 6 Click Score: 20   End of Session Equipment Utilized During Treatment: Gait belt Nurse Communication: Mobility status  Activity Tolerance: Patient tolerated treatment well Patient left: in bed;with call bell/phone within reach;with family/visitor present(dtr present in room throughout)  OT Visit Diagnosis: Unsteadiness on feet (R26.81);Muscle weakness (generalized) (M62.81)                Time: 3354-5625 OT Time Calculation (min): 39 min Charges:  OT General Charges $OT Visit: 1 Visit OT Evaluation $OT Eval Moderate Complexity: 1 Mod OT Treatments $Self Care/Home Management :  8-22 mins $Therapeutic Activity: 8-22 mins  Linda Bradley, [place], OTR/L ascom [phone] 03/17/20, 3:39 PM

## 2020-03-17 NOTE — ED Notes (Signed)
Pt assisted to bathroom

## 2020-03-17 NOTE — Consult Note (Signed)
CARDIOLOGY CONSULT NOTE               Patient ID: Linda Bradley MRN: [id number] DOB/AGE: October 21, 1941 79 y.o.  Admit date: 03/16/2020 Referring Physician Eppie Gibson, MD Primary Physician Lavera Guise, MD Primary Cardiologist Cloretta Ned, MD Reason for Consultation shortness of breath   HPI: 79 year old female referred for evaluation of shortness of breath. The patient has a history of hypertension, mild aortic stenosis, hyperlipidemia, and diagnosed with Covid-19 in January. The patient was recently evaluated by her cardiologist on 02/25/2020, at which time atenolol was switched to carvedilol. The patent reports that her blood pressure has been poorly controlled since this medication change, and has progressively become more elevated, as well as reports of progressive generalized weakness and dry hacking cough. Yesterday while lying in bed, she began feeling short of breath. She does not recall having to sit upright or change her position. She denies any recent peripheral edema. Her HCTZ was recently discontinued due to AKI. She has had a several week history of nausea without vomiting, poor oral intake, and poor appetite. She presented to [facility] ER on 03/16/2020 for shortness of breath. In the ER, her blood pressure was markedly elevated to 207/97. Oxygen saturation was 90% and she was placed on supplemental oxygen via nasal cannula. ECG revealed sinus tachycardia at a rate of 106 bpm with poor R wave progression without acute ST-T wave abnormalities. Chest xray revealed small bilateral pleural effusions and pulmonary edema. She was started on IV Lasix, IV hydralazine, and her beta blocker was held. Admission labs were notable for high-sensitivity troponin 20, followed by 135, Na 128, K 3.1, creatinine 1.94, BUN 24, GFR 24, hemoglobin 11.1, hematocrit 32.0. Currently, she reports feeling better with supplemental oxygen. She denies experiencing chest pain currently on recently. She denies  a previous similar hospitalization. She denies a history of MI or coronary stents. She denies a history of COPD, asthma, or pulmonary disease. 2D echocardiogram in 08/2019 revealed normal left ventricular function with LVEF greater than 55% with mild AS, mild MR, mild LVH.   Review of systems complete and found to be negative unless listed above     Past Medical History:  Diagnosis Date   Anxiety disorder    Dermatitis    Heart murmur    Hypercholesterolemia    Hypertension    Lymphadenitis    Skin cancer    Spontaneous ecchymoses    Vitamin D deficiency     Past Surgical History:  Procedure Laterality Date   CARPAL TUNNEL RELEASE     [facility]   TONSILLECTOMY     TRANSTHORACIC ECHOCARDIOGRAM  05/01/2015    EF 60-65%. Abnormal relaxation. Mild aortic stenosis (peak gradient 19 mmHg).    (Not in a hospital admission)  Social History   Socioeconomic History   Marital status: Widowed    Spouse name: Not on file   Number of children: Not on file   Years of education: Not on file   Highest education level: Not on file  Occupational History   Not on file  Tobacco Use   Smoking status: Never Smoker  Substance and Sexual Activity   Alcohol use: No   Drug use: No   Sexual activity: Not on file  Other Topics Concern   Not on file  Social History Narrative   Not on file   Social Determinants of Health   Financial Resource Strain:    Difficulty of Paying Living Expenses:   Food Insecurity:  Worried About Charity fundraiser in the Last Year:    Arboriculturist in the Last Year:   Transportation Needs:    Film/video editor (Medical):    Lack of Transportation (Non-Medical):   Physical Activity:    Days of Exercise per Week:    Minutes of Exercise per Session:   Stress:    Feeling of Stress :   Social Connections:    Frequency of Communication with Friends and Family:    Frequency of Social Gatherings with Friends and Family:     Attends Religious Services:    Active Member of Clubs or Organizations:    Attends Music therapist:    Marital Status:   Intimate Partner Violence:    Fear of Current or Ex-Partner:    Emotionally Abused:    Physically Abused:    Sexually Abused:     Family History  Problem Relation Age of Onset   Heart disease Father    Heart failure Father       Review of systems complete and found to be negative unless listed above      PHYSICAL EXAM  General: Well developed, well nourished, in no acute distress, lying in bed HEENT:  Normocephalic and atramatic Neck:  No JVD.  Lungs: normal effort of breathing on supplemental oxygen via , with mild basilar crackles, without wheezing Heart: HRRR . 2/6 harsh mid-systolic ejection murmur Abdomen: nondistended Msk:  Back normal. Normal strength and tone for age. Extremities: No clubbing, cyanosis or edema.   Neuro: Alert and oriented X 3. Psych:  Good affect, responds appropriately  Labs:   Lab Results  Component Value Date   WBC 10.8 (H) 03/17/2020   HGB 9.0 (L) 03/17/2020   HCT 26.4 (L) 03/17/2020   MCV 83.8 03/17/2020   PLT 307 03/17/2020    Recent Labs  Lab 03/17/20 0531  NA 132*  K 3.1*  CL 95*  CO2 26  BUN 29*  CREATININE 2.33*  CALCIUM 8.1*  GLUCOSE 99   Lab Results  Component Value Date   TROPONINI <0.03 05/01/2017   No results found for: CHOL No results found for: HDL No results found for: LDLCALC No results found for: TRIG No results found for: CHOLHDL No results found for: LDLDIRECT    Radiology: DG Chest 2 View  Result Date: 03/16/2020 CLINICAL DATA:  Shortness of breath. EXAM: CHEST - 2 VIEW COMPARISON:  Radiograph 05/01/2017 FINDINGS: Borderline cardiomegaly. Unchanged mediastinal contours. Small bilateral pleural effusions with fluid in the fissures. Interstitial opacities consistent with pulmonary edema with mild Kerley B-lines. No confluent airspace disease. No pneumothorax.  Scoliotic curvature in the spine with associated degenerative change. Aortic atherosclerosis. IMPRESSION: Findings consistent with CHF. Pulmonary edema with small pleural effusions. Aortic Atherosclerosis (ICD10-I70.0). Electronically Signed   By: Keith Rake M.D.   On: 03/16/2020 15:59   CT HEAD WO CONTRAST  Result Date: 03/16/2020 CLINICAL DATA:  Shortness of breath, chills and posterior headache. EXAM: CT HEAD WITHOUT CONTRAST TECHNIQUE: Contiguous axial images were obtained from the base of the skull through the vertex without intravenous contrast. COMPARISON:  None. FINDINGS: Brain: There is mild cerebral atrophy with widening of the extra-axial spaces and ventricular dilatation. There are areas of decreased attenuation within the white matter tracts of the supratentorial brain, consistent with microvascular disease changes. Vascular: No hyperdense vessel or unexpected calcification. Skull: Normal. Negative for fracture or focal lesion. Sinuses/Orbits: No acute finding. Other: None. IMPRESSION: 1. Generalized cerebral atrophy.  2. No acute intracranial abnormality. Electronically Signed   By: Virgina Norfolk M.D.   On: 03/16/2020 17:46    EKG: sinus tachycardia, poor R wave progression  ASSESSMENT AND PLAN:  1. Acute shortness of breath; chest xray reveals pulmonary edema and bilateral pleural effusions. Acute decompensated heart failure versus flash pulmonary edema secondary to uncontrolled hypertension? Patient denies chest pain or peripheral edema.  2. Aortic stenosis, mild per echocardiogram in 08/2019 3. Borderline elevated troponin, likely secondary to AKI, acute CHF/flash pulmonary edema, and uncontrolled hypertension; less likely ACS in the absence of chest pain or ECG changes. 4. Hypertensive urgency, recently switched from atenolol to carvedilol. Lisinopril being held due to AKI. Currently on IV hydralazine, clonidine, and IV Lasix. 5. Hyperlipidemia, previously on pravastatin,  declines statins.  Recommendations: 1. Obtain 2D echocardiogram to evaluate LV function 2. Recycle hs-troponin, and obtain BNP 3. Reduce dose of IV lasix to 20 mg daily due to AKI, and carefully monitor renal function 4. Continue IV hydralazine 5. Plan to start metoprolol when acute exacerbation resolves. 6. Further recommendations pending patient's initial course.  Signed: Clabe Seal PA-C 03/17/2020, 9:27 AM  Discussed with Dr. Saralyn Pilar who agrees with above plan.

## 2020-03-17 NOTE — Progress Notes (Signed)
*  PRELIMINARY RESULTS* Echocardiogram 2D Echocardiogram has been performed.  Wallie Char Wilda Wetherell 03/17/2020, 11:04 AM

## 2020-03-17 NOTE — Evaluation (Signed)
Physical Therapy Evaluation Patient Details Name: Linda Bradley MRN: [id number] DOB: Sep 26, 1941 Today's Date: 03/17/2020   History of Present Illness  Per MD note: Linda Bradley is a 79 y.o. female with a history of anxiety, aortic stenosis, hypertension who comes the ED complaining of shortness of breath.  She has been feeling sick for the past 2 weeks with fatigue, decreased appetite, decreased oral intake.  MD assessment: Dyspnea, Acute hypoxic respiratory failure, HTN urgency, AKI, hypokalemia, hyponatremia, leukocytosis, elevated troponins likely secondary to new CHF vs flash pulmonary edema, and thrombocytosis. Of note: COVID in January.    Clinical Impression  Pt pleasant and motivated to participate during the session.  Pt's baseline vitals on 2LO2/min include SpO2 96% and HR 92 bpm with RR WNL and with no adverse symptoms noted.  Pt on 2LO2/min throughout the session.  Pt taken through exercises with gradually increasing intensity and then ambulation 2 x 15' followed by 1 x 150' with SpO2 and HR remaining WNL and with no adverse symptoms noted.  Pt ambulated with slow, cautious cadence and short B step length without an AD but did occasionally reach to wall/furniture for light support.  Will keep pt on PT list while in acute care to prevent further functional decline but pt will likely not require skilled PT services upon discharge.     Follow Up Recommendations No PT follow up    Equipment Recommendations  None recommended by PT    Recommendations for Other Services       Precautions / Restrictions Precautions Precautions: Fall Restrictions Weight Bearing Restrictions: No Other Position/Activity Restrictions: on 2Lnc, RR increase with fxl activity.      Mobility  Bed Mobility Overal bed mobility: Modified Independent             General bed mobility comments: Extra time and effort but no physical assistance required  Transfers Overall transfer level: Needs  assistance Equipment used: None Transfers: Sit to/from Stand Sit to Stand: Supervision         General transfer comment: Good eccentric and concentric control and stability with sit to/from stand transfers from various height surfaces  Ambulation/Gait Ambulation/Gait assistance: Supervision Gait Distance (Feet): 150 Feet Assistive device: None Gait Pattern/deviations: Step-through pattern;Decreased step length - right;Decreased step length - left Gait velocity: decreased   General Gait Details: Slow cadence with pt occasionally reaching for light touch to railings or furniture but did not appear to require this for support or stability  Stairs            Wheelchair Mobility    Modified Rankin (Stroke Patients Only)       Balance Overall balance assessment: No apparent balance deficits (not formally assessed)                                           Pertinent Vitals/Pain Pain Assessment: No/denies pain    Home Living Family/patient expects to be discharged to:: Private residence Living Arrangements: Children Available Help at Discharge: Family;Available PRN/intermittently Type of Home: House Home Access: Stairs to enter Entrance Stairs-Rails: Right;Left;Can reach both Entrance Stairs-Number of Steps: 4 Home Layout: One level Home Equipment: Walker - 2 wheels;Cane - single point      Prior Function Level of Independence: Independent         Comments: Ind amb community distances without an AD, no fall history, Ind with ADLs,  walks daily for exercise     Hand Dominance        Extremity/Trunk Assessment   Upper Extremity Assessment Upper Extremity Assessment: Defer to OT evaluation    Lower Extremity Assessment Lower Extremity Assessment: Generalized weakness    Cervical / Trunk Assessment Cervical / Trunk Assessment: Normal  Communication   Communication: No difficulties  Cognition Arousal/Alertness: Awake/alert Behavior  During Therapy: WFL for tasks assessed/performed Overall Cognitive Status: Within Functional Limits for tasks assessed                                        General Comments      Exercises Total Joint Exercises Ankle Circles/Pumps: Strengthening;Both;15 reps Quad Sets: Strengthening;Both;15 reps Gluteal Sets: Strengthening;Both;15 reps Hip ABduction/ADduction: Strengthening;Both;10 reps Straight Leg Raises: Strengthening;Both;10 reps Long Arc Quad: Strengthening;Both;10 reps Knee Flexion: Strengthening;Both;10 reps Marching in Standing: AROM;Both;10 reps;Standing Other Exercises Other Exercises: OT facilitates education re: role of OT in acute setting. Pt and dtr who is present throughout verbalize understanding.   Assessment/Plan    PT Assessment Patient needs continued PT services  PT Problem List Decreased activity tolerance;Decreased strength       PT Treatment Interventions Gait training;Therapeutic activities;Therapeutic exercise;Balance training;Patient/family education;Functional mobility training;Stair training    PT Goals (Current goals can be found in the Care Plan section)  Acute Rehab PT Goals Patient Stated Goal: to get my breathing under control and go home PT Goal Formulation: With patient Time For Goal Achievement: 03/30/20 Potential to Achieve Goals: Good    Frequency Min 2X/week   Barriers to discharge        Co-evaluation               AM-PAC PT "6 Clicks" Mobility  Outcome Measure Help needed turning from your back to your side while in a flat bed without using bedrails?: None Help needed moving from lying on your back to sitting on the side of a flat bed without using bedrails?: None Help needed moving to and from a bed to a chair (including a wheelchair)?: A Little Help needed standing up from a chair using your arms (e.g., wheelchair or bedside chair)?: A Little Help needed to walk in hospital room?: A Little Help  needed climbing 3-5 steps with a railing? : A Little 6 Click Score: 20    End of Session Equipment Utilized During Treatment: Gait belt;Oxygen Activity Tolerance: Patient tolerated treatment well Patient left: in chair;with call bell/phone within reach;with chair alarm set Nurse Communication: Mobility status PT Visit Diagnosis: Muscle weakness (generalized) (M62.81)    Time: 0355-9741 PT Time Calculation (min) (ACUTE ONLY): 34 min   Charges:   PT Evaluation $PT Eval Moderate Complexity: 1 Mod PT Treatments $Therapeutic Exercise: 8-22 mins        D. Royetta Asal PT, DPT 03/17/20, 4:58 PM

## 2020-03-17 NOTE — Progress Notes (Signed)
PT Cancellation Note  Patient Details Name: GRACIA SAGGESE MRN: [id number] DOB: 06-07-1941   Cancelled Treatment:    Reason Eval/Treat Not Completed: Other (comment): PT evaluation attempted with pt found participating in OT evaluation.  Will attempt to see pt at a future date/time as medically appropriate.     Linus Salmons PT, DPT 03/17/20, 3:10 PM

## 2020-03-18 ENCOUNTER — Inpatient Hospital Stay: Payer: Medicare HMO

## 2020-03-18 LAB — BASIC METABOLIC PANEL
Anion gap: 10 (ref 5–15)
BUN: 37 mg/dL — ABNORMAL HIGH (ref 8–23)
CO2: 28 mmol/L (ref 22–32)
Calcium: 8.3 mg/dL — ABNORMAL LOW (ref 8.9–10.3)
Chloride: 90 mmol/L — ABNORMAL LOW (ref 98–111)
Creatinine, Ser: 2.48 mg/dL — ABNORMAL HIGH (ref 0.44–1.00)
GFR calc Af Amer: 21 mL/min — ABNORMAL LOW (ref >60)
GFR calc non Af Amer: 18 mL/min — ABNORMAL LOW (ref >60)
Glucose, Bld: 119 mg/dL — ABNORMAL HIGH (ref 70–99)
Potassium: 3.4 mmol/L — ABNORMAL LOW (ref 3.5–5.1)
Sodium: 128 mmol/L — ABNORMAL LOW (ref 135–145)

## 2020-03-18 LAB — IRON AND TIBC
Iron: 24 ug/dL — ABNORMAL LOW (ref 28–170)
Saturation Ratios: 11 % (ref 10.4–31.8)
TIBC: 230 ug/dL — ABNORMAL LOW (ref 250–450)
UIBC: 206 ug/dL

## 2020-03-18 LAB — RETICULOCYTES
Immature Retic Fract: 8.2 % (ref 2.3–15.9)
RBC.: 3.24 MIL/uL — ABNORMAL LOW (ref 3.87–5.11)
Retic Count, Absolute: 59.3 10*3/uL (ref 19.0–186.0)
Retic Ct Pct: 1.8 % (ref 0.4–3.1)

## 2020-03-18 LAB — CBC
HCT: 24.9 % — ABNORMAL LOW (ref 36.0–46.0)
Hemoglobin: 8.6 g/dL — ABNORMAL LOW (ref 12.0–15.0)
MCH: 28.8 pg (ref 26.0–34.0)
MCHC: 34.5 g/dL (ref 30.0–36.0)
MCV: 83.3 fL (ref 80.0–100.0)
Platelets: 301 10*3/uL (ref 150–400)
RBC: 2.99 MIL/uL — ABNORMAL LOW (ref 3.87–5.11)
RDW: 12.6 % (ref 11.5–15.5)
WBC: 12.6 10*3/uL — ABNORMAL HIGH (ref 4.0–10.5)
nRBC: 0 % (ref 0.0–0.2)

## 2020-03-18 LAB — FOLATE: Folate: 9.1 ng/mL (ref >5.9)

## 2020-03-18 LAB — FERRITIN: Ferritin: 154 ng/mL (ref 11–307)

## 2020-03-18 LAB — SEDIMENTATION RATE: Sed Rate: 86 mm/hr — ABNORMAL HIGH (ref 0–30)

## 2020-03-18 MED ORDER — FUROSEMIDE 10 MG/ML IJ SOLN
20.0000 mg | Freq: Every day | INTRAMUSCULAR | Status: DC
  Administered 2020-03-18 – 2020-03-19 (×2): 20 mg via INTRAVENOUS
  Filled 2020-03-18 (×2): qty 4

## 2020-03-18 MED ORDER — ATENOLOL 25 MG PO TABS
50.0000 mg | ORAL_TABLET | Freq: Every day | ORAL | Status: DC
  Administered 2020-03-19 – 2020-03-22 (×4): 50 mg via ORAL
  Filled 2020-03-18 (×4): qty 2

## 2020-03-18 MED ORDER — ONDANSETRON HCL 4 MG/2ML IJ SOLN
4.0000 mg | Freq: Four times a day (QID) | INTRAMUSCULAR | Status: DC | PRN
  Administered 2020-03-18: 4 mg via INTRAVENOUS
  Filled 2020-03-18: qty 2

## 2020-03-18 MED ORDER — SODIUM CHLORIDE 0.9 % IV SOLN: INTRAVENOUS | Status: DC

## 2020-03-18 MED ORDER — ONDANSETRON 4 MG PO TBDP
4.0000 mg | ORAL_TABLET | Freq: Three times a day (TID) | ORAL | Status: DC | PRN
  Administered 2020-03-19: 4 mg via ORAL
  Filled 2020-03-18 (×3): qty 1

## 2020-03-18 MED ORDER — DOCUSATE SODIUM 100 MG PO CAPS
200.0000 mg | ORAL_CAPSULE | Freq: Two times a day (BID) | ORAL | Status: DC
  Administered 2020-03-18 – 2020-03-26 (×9): 200 mg via ORAL
  Filled 2020-03-18 (×17): qty 2

## 2020-03-18 MED ORDER — SCOPOLAMINE 1 MG/3DAYS TD PT72
1.0000 | MEDICATED_PATCH | TRANSDERMAL | Status: DC
  Administered 2020-03-18: 1.5 mg via TRANSDERMAL
  Filled 2020-03-18: qty 1

## 2020-03-18 NOTE — Progress Notes (Signed)
PROGRESS NOTE    Linda Bradley  AYT:[id number] DOB: [birth year] DOA: 03/16/2020 PCP: Lynnell Jude, MD       Assessment & Plan:   Active Problems:   Dyspnea  Dyspnea: possibly secondary to new onset CHF vs flash pulmonary edema.  Continue on IV lasix. Monitor I/Os. Echo shows EF 35-57%, grade I diastolic dysfunction, & moderate to severe aortic stenosis. Cardio following and recs apprec  Acute hypoxic respiratory failure: secondary to above. Continue on supplemental oxygen and wean as tolerated.   HTN urgency: w/ recent hx of uncontrolled HTN. Will continue on clondine & hydralazine. IV hydralazine prn. D/c coreg & restarted atenolol as per cardio. Will hold ACE-I secondary to AKI   AKI: baseline Cr/GFR is unknown. Cr continues to trend up. Nephro consulted. No HCTZ as this was d/c by the pt's outpatient cardiologist at [facility] secondary to AKI. Will hold home dose of ACE-I secondary to AKI. Will continue to monitor   Normocytic anemia: etiology unclear, possibly secondary to AKI vs CKD. No need for a transfusion at this time. Will continue to monitor   Hypokalemia: will hold off repleting as Cr continues to rise. Will continue to monitor   Hyponatremia: etiology unclear, trending up today. Will continue to monitor   Leukocytosis: reactive vs infection. UA is positive for leukocytes, rare bacteria. Urine cx ordered  Pre-DM: HbA1c 5.9. BMI 32.0, would benefit from weight loss   Elevated troponins: likely secondary to possible new onset CHF vs flash pulmonary edema   Thrombocytosis: resolved  Anxiety: severity unknown. Continue on home dose of klonipin    DVT prophylaxis: lovenox Code Status: full  Family Communication: discussed w/ pt's daughter who was at bedside and answered her questions Disposition Plan: will likely d/c back home when medically stable    Consultants:  Cardio nephro   Procedures:    Antimicrobials:    Subjective: Pt c/o  malaise  Objective: Vitals:   03/17/20 1450 03/17/20 1536 03/18/20 0026 03/18/20 0538  BP: 124/62 (!) 164/80 (!) 143/72 (!) 164/72  Pulse: 90 96 97 100  Resp: 17 17 17 16   Temp:  99.5 F (37.5 C) 98.4 F (36.9 C) 98.7 F (37.1 C)  TempSrc:  Oral Oral Oral  SpO2: 95% 96% 94% 92%  Weight:      Height:        Intake/Output Summary (Last 24 hours) at 03/18/2020 0730 Last data filed at 03/17/2020 2127 Gross per 24 hour  Intake 120 ml  Output --  Net 120 ml   Filed Weights   03/16/20 1520 03/16/20 1709  Weight: 68 kg 72 kg    Examination:  General exam: Appears calm and comfortable  Respiratory system: decreased breath sounds b/l/ No wheezes/ rales Cardiovascular system: S1 & S2 +. No  rubs, gallops or clicks. B/L LE edema Gastrointestinal system: Abdomen is nondistended, soft and nontender. Normal bowel sounds heard. Central nervous system: Alert and oriented. Moves all 4 extremities  Psychiatry: Judgement and insight appear normal. Flat mood and affect     Data Reviewed: I have personally reviewed following labs and imaging studies  CBC: Recent Labs  Lab 03/16/20 1522 03/17/20 0531 03/18/20 0101  WBC 18.7* 10.8* 12.6*  HGB 11.1* 9.0* 8.6*  HCT 32.0* 26.4* 24.9*  MCV 82.9 83.8 83.3  PLT 408* 307 322   Basic Metabolic Panel: Recent Labs  Lab 03/16/20 1522 03/17/20 0531 03/18/20 0101  NA 128* 132* 128*  K 3.5 3.1* 3.4*  CL 93* 95*  90*  CO2 24 26 28   GLUCOSE 148* 99 119*  BUN 24* 29* 37*  CREATININE 1.94* 2.33* 2.48*  CALCIUM 8.8* 8.1* 8.3*  MG  --  1.5*  --    GFR: Estimated Creatinine Clearance: 16.4 mL/min (A) (by C-G formula based on SCr of 2.48 mg/dL (H)). Liver Function Tests: No results for input(s): AST, ALT, ALKPHOS, BILITOT, PROT, ALBUMIN in the last 168 hours. No results for input(s): LIPASE, AMYLASE in the last 168 hours. No results for input(s): AMMONIA in the last 168 hours. Coagulation Profile: No results for input(s): INR, PROTIME in  the last 168 hours. Cardiac Enzymes: No results for input(s): CKTOTAL, CKMB, CKMBINDEX, TROPONINI in the last 168 hours. BNP (last 3 results) No results for input(s): PROBNP in the last 8760 hours. HbA1C: Recent Labs    03/17/20 0531  HGBA1C 5.9*   CBG: No results for input(s): GLUCAP in the last 168 hours. Lipid Profile: No results for input(s): CHOL, HDL, LDLCALC, TRIG, CHOLHDL, LDLDIRECT in the last 72 hours. Thyroid Function Tests: No results for input(s): TSH, T4TOTAL, FREET4, T3FREE, THYROIDAB in the last 72 hours. Anemia Panel: No results for input(s): VITAMINB12, FOLATE, FERRITIN, TIBC, IRON, RETICCTPCT in the last 72 hours. Sepsis Labs: No results for input(s): PROCALCITON, LATICACIDVEN in the last 168 hours.  Recent Results (from the past 240 hour(s))  Urine Culture     Status: Abnormal   Collection Time: 03/16/20  5:49 PM   Specimen: Urine, Random  Result Value Ref Range Status   Specimen Description   Final    URINE, RANDOM Performed at [facility], [address], [place], [place] [address]    Special Requests   Final    NONE Performed at [facility], [address] [place], [place] [address]    Culture (A)  Final    <10,000 COLONIES/mL INSIGNIFICANT GROWTH Performed at [facility] [address] [place], [place] [address]    Report Status 03/17/2020 FINAL  Final  SARS CORONAVIRUS 2 (TAT 6-24 HRS) Nasopharyngeal Nasopharyngeal Swab     Status: None   Collection Time: 03/16/20  6:00 PM   Specimen: Nasopharyngeal Swab  Result Value Ref Range Status   SARS Coronavirus 2 NEGATIVE NEGATIVE Final    Comment: (NOTE) SARS-CoV-2 target nucleic acids are NOT DETECTED. The SARS-CoV-2 RNA is generally detectable in upper and lower respiratory specimens during the acute phase of infection. Negative results do not preclude SARS-CoV-2 infection, do not rule out co-infections with other pathogens, and should not be used as the sole basis  for treatment or other patient management decisions. Negative results must be combined with clinical observations, patient history, and epidemiological information. The expected result is Negative. Fact Sheet for Patients: SugarRoll.be Fact Sheet for Healthcare Providers: https://www.woods-mathews.com/ This test is not yet approved or cleared by the [place] FDA and  has been authorized for detection and/or diagnosis of SARS-CoV-2 by FDA under an Emergency Use Authorization (EUA). This EUA will remain  in effect (meaning this test can be used) for the duration of the COVID-19 declaration under Section 56 4(b)(1) of the Act, 21 U.S.C. section 360bbb-3(b)(1), unless the authorization is terminated or revoked sooner. Performed at [facility], [place] [address] [place], [place] [address]          Radiology Studies: DG Chest 2 View  Result Date: 03/16/2020 CLINICAL DATA:  Shortness of breath. EXAM: CHEST - 2 VIEW COMPARISON:  Radiograph 05/01/2017 FINDINGS: Borderline cardiomegaly. Unchanged  mediastinal contours. Small bilateral pleural effusions with fluid in the fissures. Interstitial opacities consistent with pulmonary edema with mild Kerley B-lines. No confluent airspace disease. No pneumothorax. Scoliotic curvature in the spine with associated degenerative change. Aortic atherosclerosis. IMPRESSION: Findings consistent with CHF. Pulmonary edema with small pleural effusions. Aortic Atherosclerosis (ICD10-I70.0). Electronically Signed   By: Keith Rake M.D.   On: 03/16/2020 15:59   CT HEAD WO CONTRAST  Result Date: 03/16/2020 CLINICAL DATA:  Shortness of breath, chills and posterior headache. EXAM: CT HEAD WITHOUT CONTRAST TECHNIQUE: Contiguous axial images were obtained from the base of the skull through the vertex without intravenous contrast. COMPARISON:  None. FINDINGS: Brain: There is mild cerebral atrophy with widening of the  extra-axial spaces and ventricular dilatation. There are areas of decreased attenuation within the white matter tracts of the supratentorial brain, consistent with microvascular disease changes. Vascular: No hyperdense vessel or unexpected calcification. Skull: Normal. Negative for fracture or focal lesion. Sinuses/Orbits: No acute finding. Other: None. IMPRESSION: 1. Generalized cerebral atrophy. 2. No acute intracranial abnormality. Electronically Signed   By: Virgina Norfolk M.D.   On: 03/16/2020 17:46   ECHOCARDIOGRAM COMPLETE  Result Date: 03/17/2020    ECHOCARDIOGRAM REPORT   Patient Name:   KEARSTIN LEARN Date of Exam: 03/17/2020 Medical Rec #:  [id number]       Height:       59.0 in Accession #:    [id number]      Weight:       158.7 lb Date of Birth:  02-Dec-1941      BSA:          1.672 m Patient Age:    41 years        BP:           120/59 mmHg Patient Gender: F               HR:           98 bpm. Exam Location:  [facility] Procedure: 2D Echo, Color Doppler and Cardiac Doppler Indications:     R06.00 Dyspnea  History:         Patient has prior history of Echocardiogram examinations.                  Signs/Symptoms:Murmur; Risk Factors:Hypertension and HCL.  Sonographer:     Charmayne Sheer RDCS (AE) Referring Phys:  [id number] BRENDA MORRISON Diagnosing Phys: Isaias Cowman MD IMPRESSIONS  1. Left ventricular ejection fraction, by estimation, is 55 to 60%. The left ventricle has normal function. The left ventricle has no regional wall motion abnormalities. Left ventricular diastolic parameters are consistent with Grade I diastolic dysfunction (impaired relaxation).  2. Right ventricular systolic function is normal. The right ventricular size is normal.  3. The mitral valve is normal in structure. Mild mitral valve regurgitation. No evidence of mitral stenosis.  4. The aortic valve is normal in structure. Aortic valve regurgitation is mild to moderate. 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  5. The inferior  vena cava is normal in size with greater than 50% respiratory variability, suggesting right atrial pressure of 3 mmHg. FINDINGS  Left Ventricle: Left ventricular ejection fraction, by estimation, is 55 to 60%. The left ventricle has normal function. The left ventricle has no regional wall motion abnormalities. The left ventricular internal cavity size was normal in size. There is  no left ventricular hypertrophy. Left ventricular diastolic parameters are consistent with Grade I diastolic dysfunction (impaired relaxation). Right Ventricle: The right  ventricular size is normal. No increase in right ventricular wall thickness. Right ventricular systolic function is normal. Left Atrium: Left atrial size was normal in size. Right Atrium: Right atrial size was normal in size. Pericardium: There is no evidence of pericardial effusion. Mitral Valve: The mitral valve is normal in structure. Normal mobility of the mitral valve leaflets. Mild mitral valve regurgitation. No evidence of mitral valve stenosis. MV peak gradient, 7.4 mmHg. The mean mitral valve gradient is 3.0 mmHg. Tricuspid Valve: The tricuspid valve is normal in structure. Tricuspid valve regurgitation is mild . No evidence of tricuspid stenosis. Aortic Valve: The aortic valve is normal in structure. Aortic valve regurgitation is mild to moderate. Moderate to severe aortic stenosis is present. Aortic valve mean gradient measures 24.5 mmHg. Aortic valve peak gradient measures 43.2 mmHg. Aortic valve area, by VTI measures 0.99 cm. Pulmonic Valve: The pulmonic valve was normal in structure. Pulmonic valve regurgitation is not visualized. No evidence of pulmonic stenosis. Aorta: The aortic root is normal in size and structure. Venous: The inferior vena cava is normal in size with greater than 50% respiratory variability, suggesting right atrial pressure of 3 mmHg. IAS/Shunts: No atrial level shunt detected by color flow Doppler.  LEFT VENTRICLE PLAX 2D LVIDd:          2.76 cm     Diastology LVIDs:         2.18 cm     LV e' lateral:   7.83 cm/s LV PW:         0.94 cm     LV E/e' lateral: 11.0 LV IVS:        0.92 cm     LV e' medial:    4.79 cm/s LVOT diam:     1.70 cm     LV E/e' medial:  17.9 LV SV:         61 LV SV Index:   37 LVOT Area:     2.27 cm  LV Volumes (MOD) LV vol d, MOD A2C: 45.9 ml LV vol d, MOD A4C: 53.5 ml LV vol s, MOD A2C: 18.3 ml LV vol s, MOD A4C: 20.8 ml LV SV MOD A2C:     27.6 ml LV SV MOD A4C:     53.5 ml LV SV MOD BP:      29.5 ml RIGHT VENTRICLE RV Basal diam:  2.94 cm LEFT ATRIUM             Index       RIGHT ATRIUM          Index LA diam:        3.00 cm 1.79 cm/m  RA Area:     7.50 cm LA Vol (A2C):   17.4 ml 10.41 ml/m RA Volume:   11.10 ml 6.64 ml/m LA Vol (A4C):   34.0 ml 20.34 ml/m LA Biplane Vol: 26.4 ml 15.79 ml/m  AORTIC VALVE                    PULMONIC VALVE AV Area (Vmax):    0.85 cm     PV Vmax:       1.79 m/s AV Area (Vmean):   0.84 cm     PV Vmean:      119.000 cm/s AV Area (VTI):     0.99 cm     PV VTI:        0.339 m AV Vmax:           328.50  cm/s  PV Peak grad:  12.8 mmHg AV Vmean:          230.000 cm/s PV Mean grad:  7.0 mmHg AV VTI:            0.619 m AV Peak Grad:      43.2 mmHg AV Mean Grad:      24.5 mmHg LVOT Vmax:         123.00 cm/s LVOT Vmean:        85.600 cm/s LVOT VTI:          0.270 m LVOT/AV VTI ratio: 0.44  AORTA Ao Root diam: 3.00 cm MITRAL VALVE MV Area (PHT): 3.85 cm     SHUNTS MV Peak grad:  7.4 mmHg     Systemic VTI:  0.27 m MV Mean grad:  3.0 mmHg     Systemic Diam: 1.70 cm MV Vmax:       1.36 m/s MV Vmean:      85.3 cm/s MV Decel Time: 197 msec MV E velocity: 85.90 cm/s MV A velocity: 125.00 cm/s MV E/A ratio:  0.69 Isaias Cowman MD Electronically signed by Isaias Cowman MD Signature Date/Time: 03/17/2020/3:42:25 PM    Final         Scheduled Meds: . aspirin EC  81 mg Oral Daily  . clonazePAM  0.25 mg Oral BID  . cloNIDine  0.1 mg Oral TID  . enoxaparin (LOVENOX) injection  30 mg  Subcutaneous Q24H  . furosemide  40 mg Intravenous Daily  . hydrALAZINE  50 mg Oral Q8H  . sodium chloride flush  3 mL Intravenous Once  . sodium chloride flush  3 mL Intravenous Q12H   Continuous Infusions:   LOS: 2 days    Time spent: 35 mins    Wyvonnia Dusky, MD [facility] Pager [phone]  If 7PM-7AM, please contact night-coverage [email or web address] 03/18/2020, 7:30 AM  '

## 2020-03-18 NOTE — Consult Note (Signed)
Linda Bradley MRN: [id number] DOB/AGE: [birth year] 79 y.o. Primary Care Physician:Bliss, Lynnell Jude, MD Admit date: 03/16/2020 Chief Complaint:  Chief Complaint  Patient presents with  . Shortness of Breath  . Hypertension   HPI: Patient is a 79 year old Caucasian female with a past medical history of hypertension, aortic stenosis, COVID-19 in January 2021 who came to the ER with chief complaint of shortness of breath.   History of present illness as per the patient dates back to January.  Patient correlates her illness to January when she was diagnosed with Covid.  Patient said she had no recovery from Covid and then she correlates it to February 25, 2020 when patient was taken off beta-blockers and was started on carvedilol.  Patient reports that her blood pressure has been uncontrolled since the medication was changed.  Patient thereafter developed shortness of breath on March 16, 2020.  Upon evaluation in the ER patient was found to have hypertensive urgency with systolic blood pressure 517.  Patient was also found to have bilateral pleural effusion and pulmonary edema. Patient was started on IV diuretics and was admitted for further treatment.   Nephrology was consulted. Patient seen today on first floor.  Patient offers no specific complaint. Patient states her breathing is better. No complaint of lower extremity edema. No complaint of hematuria. No history of over-the-counter NSAID use . Patient daughter who was present in the room gives a history with patient was not eating or drinking for past 3 weeks but she was still taking her medications especially the antihypertensives including ACE  Past Medical History:  Diagnosis Date  . Anxiety disorder   . Dermatitis   . Heart murmur   . Hypercholesterolemia   . Hypertension   . Lymphadenitis   . Skin cancer   . Spontaneous ecchymoses   . Vitamin D deficiency         Family History  Problem Relation Age of Onset  . Heart  disease Father   . Heart failure Father     Social History:  reports that she has never smoked. She has never used smokeless tobacco. She reports that she does not drink alcohol or use drugs.   Allergies:  Allergies  Allergen Reactions  . Amlodipine Swelling  . Aspirin   . Chlorthalidone Diarrhea  . Statins     Other reaction(s): Other (See Comments) Other Reaction: CNS DISORDER    Medications Prior to Admission  Medication Sig Dispense Refill  . carvedilol (COREG) 12.5 MG tablet Take 12.5 mg by mouth in the morning and at bedtime.    . clonazePAM (KLONOPIN) 0.5 MG tablet Take 0.25 mg by mouth 2 (two) times daily.     . cloNIDine (CATAPRES) 0.1 MG tablet Take 1 tablet (0.1 mg total) by mouth 3 (three) times daily. (Patient taking differently: Take 0.1 mg by mouth 2 (two) times daily. Take and extra one if >160 if needed) 30 tablet 0  . hydrALAZINE (APRESOLINE) 50 MG tablet Take 50 tablets by mouth in the morning and at bedtime.     Marland Kitchen lisinopril (PRINIVIL,ZESTRIL) 40 MG tablet Take 1 tablet by mouth at bedtime.          OHY:WVPXT from the symptoms mentioned above,there are no other symptoms referable to all systems reviewed.  Marland Kitchen aspirin EC  81 mg Oral Daily  . atenolol  50 mg Oral Daily  . clonazePAM  0.25 mg Oral BID  . cloNIDine  0.1 mg Oral TID  . docusate sodium  200 mg Oral BID  . enoxaparin (LOVENOX) injection  30 mg Subcutaneous Q24H  . furosemide  20 mg Intravenous Daily  . hydrALAZINE  50 mg Oral Q8H  . sodium chloride flush  3 mL Intravenous Once  . sodium chloride flush  3 mL Intravenous Q12H      Physical Exam: Vital signs in last 24 hours: Temp:  [98.4 F (36.9 C)-99.6 F (37.6 C)] 99.6 F (37.6 C) (03/27 1151) Pulse Rate:  [90-102] 102 (03/27 0823) Resp:  [16-18] 18 (03/27 0823) BP: (124-164)/(62-80) 139/76 (03/27 1151) SpO2:  [89 %-96 %] 89 % (03/27 0823) Weight change:  Last BM Date: 03/16/20  Intake/Output from previous day: 03/26 0701 -  03/27 0700 In: 240 [P.O.:240] Out: -  Total I/O In: 3 [I.V.:3] Out: -    Physical Exam: General- pt is awake,alert, oriented to time place and person Resp- No acute REsp distress, CTA B/L NO Rhonchi CVS- S1S2 regular ij rate and rhythm GIT- BS+, soft, NT, ND EXT- NO LE Edema, Cyanosis CNS- CN 2-12 grossly intact. Moving all 4 extremities Psych- normal mood and affect    Lab Results: CBC Recent Labs    03/17/20 0531 03/18/20 0101  WBC 10.8* 12.6*  HGB 9.0* 8.6*  HCT 26.4* 24.9*  PLT 307 301    BMET Recent Labs    03/17/20 0531 03/18/20 0101  NA 132* 128*  K 3.1* 3.4*  CL 95* 90*  CO2 26 28  GLUCOSE 99 119*  BUN 29* 37*  CREATININE 2.33* 2.48*  CALCIUM 8.1* 8.3*    Creatinine trend 2021 1.9==>2.5  Data from care everywhere at [facility] 1.1   MICRO Recent Results (from the past 240 hour(s))  Urine Culture     Status: Abnormal   Collection Time: 03/16/20  5:49 PM   Specimen: Urine, Random  Result Value Ref Range Status   Specimen Description   Final    URINE, RANDOM Performed at [facility], [address], [place], [place] [address]    Special Requests   Final    NONE Performed at [facility], [address] [place], [place] [address]    Culture (A)  Final    <10,000 COLONIES/mL INSIGNIFICANT GROWTH Performed at [facility], [place] [address], [place], [place] [address]    Report Status 03/17/2020 FINAL  Final  SARS CORONAVIRUS 2 (TAT 6-24 HRS) Nasopharyngeal Nasopharyngeal Swab     Status: None   Collection Time: 03/16/20  6:00 PM   Specimen: Nasopharyngeal Swab  Result Value Ref Range Status   SARS Coronavirus 2 NEGATIVE NEGATIVE Final    Comment: (NOTE) SARS-CoV-2 target nucleic acids are NOT DETECTED. The SARS-CoV-2 RNA is generally detectable in upper and lower respiratory specimens during the acute phase of infection. Negative results do not preclude SARS-CoV-2 infection, do not  rule out co-infections with other pathogens, and should not be used as the sole basis for treatment or other patient management decisions. Negative results must be combined with clinical observations, patient history, and epidemiological information. The expected result is Negative. Fact Sheet for Patients: SugarRoll.be Fact Sheet for Healthcare Providers: https://www.woods-mathews.com/ This test is not yet approved or cleared by the [place] FDA and  has been authorized for detection and/or diagnosis of SARS-CoV-2 by FDA under an Emergency Use Authorization (EUA). This EUA will remain  in effect (meaning this test can be used) for the duration of the COVID-19 declaration under Section 56 4(b)(1) of the  Act, 21 U.S.C. section 360bbb-3(b)(1), unless the authorization is terminated or revoked sooner. Performed at [facility], [place] [address] [place], [place] [address]       Lab Results  Component Value Date   CALCIUM 8.3 (L) 03/18/2020   Renal ultrasound done today Right Kidney:  Renal measurements: 9 x 5.1 x 4.9 cm = volume: 117 mL. There is increased echotexture of right kidney. No focal mass or hydronephrosis is identified.  Left Kidney:  Renal measurements: 11.1 x 5.1 x 4.2 cm = volume: 126 mL. Echogenicity within normal limits. No mass or hydronephrosis visualized.  Bladder:  Appears normal for degree of bladder distention.  Other:  None.  IMPRESSION: Increased echotexture of right kidney.  Otherwise negative.  Impression:   Patient is a 79 year old Caucasian female with a past medical history of hypertension, aortic stenosis, hyperlipidemia, CKD who came to the hospital with chief complaint of shortness of breath patient was diagnosed with pleural effusion, pulmonary edema, hypertensive urgency, anemia, hypokalemia, hyponatremia and elevated troponins  1)Renal  AKI secondary to ATN/GN  Data in  favor of ATN Decreased p.o. 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  GN from COVID-19?  HUS?  Data in favor of HUS is rise in creatinine and rapid fall in hemoglobin Dad against HUS as patient does not have thrombocytopenia or any change in neurological function  Patient has AKI on CKD. 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 Patient has CKD stage III most likely secondary to hypertension.  Will initiate autoimmune work-up  Renal ultrasound done today ruled out postobstructive issues    2)HTN Patient blood pressure is now much better controlled  3)Anemia of chronic disease  HGb is not at goal (9--11) Will initiate anemia work-up   4) secondary hyperparathyroidism -CKD Mineral-Bone Disorder Will initiate secondary hyperparathyroidism work-up  5) hypokalemia Patient KCl is being replete  6) hyponatremia Hypervolemic hyponatremia Patient was admitted with pleural effusion and pulmonary edema. Patient currently appears euvolemic We will ask for chest x-ray to help with fluid assessment  7)Acid base Co2 at goal  8) elevated troponins Cardiology and primary team are following closely   Plan:  We will initiate autoimmune work-up We will initiate anemia work-up We will also ask for haptoglobin and LDH We will ask for chest x-ray to help with fluid assessment We will ask for strict INO's We will decide IV fluids after evaluating chest x-ray  I had extensive discussion with the patient and her daughter about her kidney related issues. I did discuss with the patient and her daughter about possibly needing a renal biopsy. Answered their questions best of my ability      Tyashia Morrisette s Theador Hawthorne 03/18/2020, 12:35 PM

## 2020-03-18 NOTE — Progress Notes (Signed)
[facility] Cardiology  SUBJECTIVE: Patient laying in bed, with pulmonal O2 by nasal cannula, reports feeling somewhat better   Vitals:   03/17/20 1536 03/18/20 0026 03/18/20 0538 03/18/20 0823  BP: (!) 164/80 (!) 143/72 (!) 164/72 (!) 159/68  Pulse: 96 97 100 (!) 102  Resp: 17 17 16 18   Temp: 99.5 F (37.5 C) 98.4 F (36.9 C) 98.7 F (37.1 C) 99.5 F (37.5 C)  TempSrc: Oral Oral Oral Oral  SpO2: 96% 94% 92% (!) 89%  Weight:      Height:         Intake/Output Summary (Last 24 hours) at 03/18/2020 1055 Last data filed at 03/18/2020 5400 Gross per 24 hour  Intake 243 ml  Output --  Net 243 ml      PHYSICAL EXAM  General: Well developed, well nourished, in no acute distress HEENT:  Normocephalic and atramatic Neck:  No JVD.  Lungs: Clear bilaterally to auscultation and percussion. Heart: HRRR . Normal S1 and S2 without gallops or murmurs.  Abdomen: Bowel sounds are positive, abdomen soft and non-tender  Msk:  Back normal, normal gait. Normal strength and tone for age. Extremities: No clubbing, cyanosis or edema.   Neuro: Alert and oriented X 3. Psych:  Good affect, responds appropriately   LABS: Basic Metabolic Panel: Recent Labs    03/17/20 0531 03/18/20 0101  NA 132* 128*  K 3.1* 3.4*  CL 95* 90*  CO2 26 28  GLUCOSE 99 119*  BUN 29* 37*  CREATININE 2.33* 2.48*  CALCIUM 8.1* 8.3*  MG 1.5*  --    Liver Function Tests: No results for input(s): AST, ALT, ALKPHOS, BILITOT, PROT, ALBUMIN in the last 72 hours. No results for input(s): LIPASE, AMYLASE in the last 72 hours. CBC: Recent Labs    03/17/20 0531 03/18/20 0101  WBC 10.8* 12.6*  HGB 9.0* 8.6*  HCT 26.4* 24.9*  MCV 83.8 83.3  PLT 307 301   Cardiac Enzymes: No results for input(s): CKTOTAL, CKMB, CKMBINDEX, TROPONINI in the last 72 hours. BNP: Invalid input(s): POCBNP D-Dimer: No results for input(s): DDIMER in the last 72 hours. Hemoglobin A1C: Recent Labs    03/17/20 0531  HGBA1C 5.9*    Fasting Lipid Panel: No results for input(s): CHOL, HDL, LDLCALC, TRIG, CHOLHDL, LDLDIRECT in the last 72 hours. Thyroid Function Tests: No results for input(s): TSH, T4TOTAL, T3FREE, THYROIDAB in the last 72 hours.  Invalid input(s): FREET3 Anemia Panel: No results for input(s): VITAMINB12, FOLATE, FERRITIN, TIBC, IRON, RETICCTPCT in the last 72 hours.  DG Chest 2 View  Result Date: 03/16/2020 CLINICAL DATA:  Shortness of breath. EXAM: CHEST - 2 VIEW COMPARISON:  Radiograph 05/01/2017 FINDINGS: Borderline cardiomegaly. Unchanged mediastinal contours. Small bilateral pleural effusions with fluid in the fissures. Interstitial opacities consistent with pulmonary edema with mild Kerley B-lines. No confluent airspace disease. No pneumothorax. Scoliotic curvature in the spine with associated degenerative change. Aortic atherosclerosis. IMPRESSION: Findings consistent with CHF. Pulmonary edema with small pleural effusions. Aortic Atherosclerosis (ICD10-I70.0). Electronically Signed   By: Keith Rake M.D.   On: 03/16/2020 15:59   CT HEAD WO CONTRAST  Result Date: 03/16/2020 CLINICAL DATA:  Shortness of breath, chills and posterior headache. EXAM: CT HEAD WITHOUT CONTRAST TECHNIQUE: Contiguous axial images were obtained from the base of the skull through the vertex without intravenous contrast. COMPARISON:  None. FINDINGS: Brain: There is mild cerebral atrophy with widening of the extra-axial spaces and ventricular dilatation. There are areas of decreased attenuation within the white matter  tracts of the supratentorial brain, consistent with microvascular disease changes. Vascular: No hyperdense vessel or unexpected calcification. Skull: Normal. Negative for fracture or focal lesion. Sinuses/Orbits: No acute finding. Other: None. IMPRESSION: 1. Generalized cerebral atrophy. 2. No acute intracranial abnormality. Electronically Signed   By: Virgina Norfolk M.D.   On: 03/16/2020 17:46    ECHOCARDIOGRAM COMPLETE  Result Date: 03/17/2020    ECHOCARDIOGRAM REPORT   Patient Name:   Linda Bradley Date of Exam: 03/17/2020 Medical Rec #:  [id number]       Height:       59.0 in Accession #:    [id number]      Weight:       158.7 lb Date of Birth:  1941-09-22      BSA:          1.672 m Patient Age:    79 years        BP:           120/59 mmHg Patient Gender: F               HR:           98 bpm. Exam Location:  [facility] Procedure: 2D Echo, Color Doppler and Cardiac Doppler Indications:     R06.00 Dyspnea  History:         Patient has prior history of Echocardiogram examinations.                  Signs/Symptoms:Murmur; Risk Factors:Hypertension and HCL.  Sonographer:     Charmayne Sheer RDCS (AE) Referring Phys:  [id number] BRENDA MORRISON Diagnosing Phys: Isaias Cowman MD IMPRESSIONS  1. Left ventricular ejection fraction, by estimation, is 55 to 60%. The left ventricle has normal function. The left ventricle has no regional wall motion abnormalities. Left ventricular diastolic parameters are consistent with Grade I diastolic dysfunction (impaired relaxation).  2. Right ventricular systolic function is normal. The right ventricular size is normal.  3. The mitral valve is normal in structure. Mild mitral valve regurgitation. No evidence of mitral stenosis.  4. The aortic valve is normal in structure. Aortic valve regurgitation is mild to moderate. 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  5. The inferior vena cava is normal in size with greater than 50% respiratory variability, suggesting right atrial pressure of 3 mmHg. FINDINGS  Left Ventricle: Left ventricular ejection fraction, by estimation, is 55 to 60%. The left ventricle has normal function. The left ventricle has no regional wall motion abnormalities. The left ventricular internal cavity size was normal in size. There is  no left ventricular hypertrophy. Left ventricular diastolic parameters are consistent with Grade I diastolic dysfunction  (impaired relaxation). Right Ventricle: The right ventricular size is normal. No increase in right ventricular wall thickness. Right ventricular systolic function is normal. Left Atrium: Left atrial size was normal in size. Right Atrium: Right atrial size was normal in size. Pericardium: There is no evidence of pericardial effusion. Mitral Valve: The mitral valve is normal in structure. Normal mobility of the mitral valve leaflets. Mild mitral valve regurgitation. No evidence of mitral valve stenosis. MV peak gradient, 7.4 mmHg. The mean mitral valve gradient is 3.0 mmHg. Tricuspid Valve: The tricuspid valve is normal in structure. Tricuspid valve regurgitation is mild . No evidence of tricuspid stenosis. Aortic Valve: The aortic valve is normal in structure. Aortic valve regurgitation is mild to moderate. Moderate to severe aortic stenosis is present. Aortic valve mean gradient measures 24.5 mmHg. Aortic valve peak gradient measures 43.2  mmHg. Aortic valve area, by VTI measures 0.99 cm. Pulmonic Valve: The pulmonic valve was normal in structure. Pulmonic valve regurgitation is not visualized. No evidence of pulmonic stenosis. Aorta: The aortic root is normal in size and structure. Venous: The inferior vena cava is normal in size with greater than 50% respiratory variability, suggesting right atrial pressure of 3 mmHg. IAS/Shunts: No atrial level shunt detected by color flow Doppler.  LEFT VENTRICLE PLAX 2D LVIDd:         2.76 cm     Diastology LVIDs:         2.18 cm     LV e' lateral:   7.83 cm/s LV PW:         0.94 cm     LV E/e' lateral: 11.0 LV IVS:        0.92 cm     LV e' medial:    4.79 cm/s LVOT diam:     1.70 cm     LV E/e' medial:  17.9 LV SV:         61 LV SV Index:   37 LVOT Area:     2.27 cm  LV Volumes (MOD) LV vol d, MOD A2C: 45.9 ml LV vol d, MOD A4C: 53.5 ml LV vol s, MOD A2C: 18.3 ml LV vol s, MOD A4C: 20.8 ml LV SV MOD A2C:     27.6 ml LV SV MOD A4C:     53.5 ml LV SV MOD BP:      29.5 ml RIGHT  VENTRICLE RV Basal diam:  2.94 cm LEFT ATRIUM             Index       RIGHT ATRIUM          Index LA diam:        3.00 cm 1.79 cm/m  RA Area:     7.50 cm LA Vol (A2C):   17.4 ml 10.41 ml/m RA Volume:   11.10 ml 6.64 ml/m LA Vol (A4C):   34.0 ml 20.34 ml/m LA Biplane Vol: 26.4 ml 15.79 ml/m  AORTIC VALVE                    PULMONIC VALVE AV Area (Vmax):    0.85 cm     PV Vmax:       1.79 m/s AV Area (Vmean):   0.84 cm     PV Vmean:      119.000 cm/s AV Area (VTI):     0.99 cm     PV VTI:        0.339 m AV Vmax:           328.50 cm/s  PV Peak grad:  12.8 mmHg AV Vmean:          230.000 cm/s PV Mean grad:  7.0 mmHg AV VTI:            0.619 m AV Peak Grad:      43.2 mmHg AV Mean Grad:      24.5 mmHg LVOT Vmax:         123.00 cm/s LVOT Vmean:        85.600 cm/s LVOT VTI:          0.270 m LVOT/AV VTI ratio: 0.44  AORTA Ao Root diam: 3.00 cm MITRAL VALVE MV Area (PHT): 3.85 cm     SHUNTS MV Peak grad:  7.4 mmHg     Systemic VTI:  0.27 m MV Mean  grad:  3.0 mmHg     Systemic Diam: 1.70 cm MV Vmax:       1.36 m/s MV Vmean:      85.3 cm/s MV Decel Time: 197 msec MV E velocity: 85.90 cm/s MV A velocity: 125.00 cm/s MV E/A ratio:  0.69 Isaias Cowman MD Electronically signed by Isaias Cowman MD Signature Date/Time: 03/17/2020/3:42:25 PM    Final      Echo pending  TELEMETRY: Sinus rhythm:  ASSESSMENT AND PLAN:  Active Problems:   Dyspnea    1.  Shortness of breath, chest x-ray revealing pulmonary edema with bilateral pleural effusions, in the setting of anemia, and acute kidney injury 2.  Acute diastolic congestive heart failure, patient currently appears to be euvolemic 3.  Aortic stenosis, mild by 2D echocardiogram 08/2019 4.  Borderline elevated troponin, in the absence of chest pain, likely demand supply ischemia, secondary to acute diastolic CHF, uncontrolled hypertension, and acute kidney injury 5.  Essential hypertension, blood pressure elevated, recently switched from atenolol to  carvedilol which was not well tolerated 6.  Anemia, no recent history of GI bleed  Recommendations  1.  Agree with current therapy 2.  Continue diuresis for now 3.  Carefully monitor renal status 4.  Consider nephrology consult 5.  Anemia work-up per Dr. Jimmye Norman 6.  Restart atenolol 50 mg daily 7.  Review 2D echocardiogram   Isaias Cowman, MD, PhD, [facility] 03/18/2020 10:55 AM

## 2020-03-19 LAB — CBC
HCT: 24.3 % — ABNORMAL LOW (ref 36.0–46.0)
Hemoglobin: 8.4 g/dL — ABNORMAL LOW (ref 12.0–15.0)
MCH: 28.7 pg (ref 26.0–34.0)
MCHC: 34.6 g/dL (ref 30.0–36.0)
MCV: 82.9 fL (ref 80.0–100.0)
Platelets: 307 10*3/uL (ref 150–400)
RBC: 2.93 MIL/uL — ABNORMAL LOW (ref 3.87–5.11)
RDW: 12.6 % (ref 11.5–15.5)
WBC: 14.6 10*3/uL — ABNORMAL HIGH (ref 4.0–10.5)
nRBC: 0 % (ref 0.0–0.2)

## 2020-03-19 LAB — HEPATITIS PANEL, ACUTE
HCV Ab: NONREACTIVE
Hep A IgM: NONREACTIVE
Hep B C IgM: NONREACTIVE
Hepatitis B Surface Ag: NONREACTIVE

## 2020-03-19 LAB — URINALYSIS, ROUTINE W REFLEX MICROSCOPIC
Bilirubin Urine: NEGATIVE
Glucose, UA: NEGATIVE mg/dL
Ketones, ur: NEGATIVE mg/dL
Nitrite: NEGATIVE
Protein, ur: 100 mg/dL — AB
RBC / HPF: >50 RBC/hpf — ABNORMAL HIGH (ref 0–5)
Specific Gravity, Urine: 1.011 (ref 1.005–1.030)
pH: 5 (ref 5.0–8.0)

## 2020-03-19 LAB — BASIC METABOLIC PANEL
Anion gap: 12 (ref 5–15)
BUN: 41 mg/dL — ABNORMAL HIGH (ref 8–23)
CO2: 25 mmol/L (ref 22–32)
Calcium: 8.2 mg/dL — ABNORMAL LOW (ref 8.9–10.3)
Chloride: 89 mmol/L — ABNORMAL LOW (ref 98–111)
Creatinine, Ser: 2.7 mg/dL — ABNORMAL HIGH (ref 0.44–1.00)
GFR calc Af Amer: 19 mL/min — ABNORMAL LOW (ref >60)
GFR calc non Af Amer: 16 mL/min — ABNORMAL LOW (ref >60)
Glucose, Bld: 105 mg/dL — ABNORMAL HIGH (ref 70–99)
Potassium: 3 mmol/L — ABNORMAL LOW (ref 3.5–5.1)
Sodium: 126 mmol/L — ABNORMAL LOW (ref 135–145)

## 2020-03-19 LAB — VITAMIN B12
Vitamin B-12: 161 pg/mL — ABNORMAL LOW (ref 180–914)
Vitamin B-12: 149 pg/mL — ABNORMAL LOW (ref 180–914)

## 2020-03-19 LAB — IRON AND TIBC
Iron: 15 ug/dL — ABNORMAL LOW (ref 28–170)
Saturation Ratios: 8 % — ABNORMAL LOW (ref 10.4–31.8)
TIBC: 197 ug/dL — ABNORMAL LOW (ref 250–450)
UIBC: 182 ug/dL

## 2020-03-19 LAB — FERRITIN: Ferritin: 163 ng/mL (ref 11–307)

## 2020-03-19 LAB — RETICULOCYTES
Immature Retic Fract: 8 % (ref 2.3–15.9)
RBC.: 2.94 MIL/uL — ABNORMAL LOW (ref 3.87–5.11)
Retic Count, Absolute: 60.6 10*3/uL (ref 19.0–186.0)
Retic Ct Pct: 2.1 % (ref 0.4–3.1)

## 2020-03-19 LAB — FOLATE: Folate: 8.4 ng/mL (ref >5.9)

## 2020-03-19 LAB — LACTATE DEHYDROGENASE: LDH: 136 U/L (ref 98–192)

## 2020-03-19 LAB — SODIUM, URINE, RANDOM: Sodium, Ur: 25 mmol/L

## 2020-03-19 LAB — PHOSPHORUS: Phosphorus: 4.9 mg/dL — ABNORMAL HIGH (ref 2.5–4.6)

## 2020-03-19 LAB — PROTEIN, URINE, RANDOM: Total Protein, Urine: 73 mg/dL

## 2020-03-19 LAB — CREATININE, URINE, RANDOM: Creatinine, Urine: 90 mg/dL

## 2020-03-19 MED ORDER — MAGNESIUM HYDROXIDE 400 MG/5ML PO SUSP
15.0000 mL | Freq: Every day | ORAL | Status: DC | PRN
  Administered 2020-03-19: 15 mL via ORAL
  Filled 2020-03-19 (×2): qty 30

## 2020-03-19 MED ORDER — POTASSIUM CHLORIDE CRYS ER 20 MEQ PO TBCR
40.0000 meq | EXTENDED_RELEASE_TABLET | Freq: Once | ORAL | Status: AC
  Administered 2020-03-19: 40 meq via ORAL
  Filled 2020-03-19: qty 2

## 2020-03-19 MED ORDER — BISACODYL 5 MG PO TBEC
5.0000 mg | DELAYED_RELEASE_TABLET | Freq: Every day | ORAL | Status: DC
  Administered 2020-03-19 – 2020-03-26 (×4): 5 mg via ORAL
  Filled 2020-03-19 (×8): qty 1

## 2020-03-19 MED ORDER — FUROSEMIDE 10 MG/ML IJ SOLN
40.0000 mg | Freq: Every day | INTRAMUSCULAR | Status: DC
  Administered 2020-03-20 – 2020-03-21 (×2): 40 mg via INTRAVENOUS
  Filled 2020-03-19 (×2): qty 4

## 2020-03-19 MED ORDER — CYANOCOBALAMIN 1000 MCG/ML IJ SOLN
1000.0000 ug | Freq: Once | INTRAMUSCULAR | Status: AC
  Administered 2020-03-19: 1000 ug via INTRAMUSCULAR
  Filled 2020-03-19: qty 1

## 2020-03-19 MED ORDER — SODIUM CHLORIDE 0.9 % IV SOLN
510.0000 mg | INTRAVENOUS | Status: AC
  Administered 2020-03-19 – 2020-03-26 (×2): 510 mg via INTRAVENOUS
  Filled 2020-03-19 (×2): qty 17

## 2020-03-19 NOTE — Progress Notes (Signed)
PROGRESS NOTE    Linda Bradley  TMH:[id number] DOB: [birth year] DOA: 03/16/2020 PCP: Lynnell Jude, MD       Assessment & Plan:   Active Problems:   Dyspnea  Dyspnea: possibly secondary to new onset CHF vs flash pulmonary edema.  Continue on IV lasix. Monitor I/Os. Echo shows EF 92-11%, grade I diastolic dysfunction, & moderate to severe aortic stenosis. Cardio following and recs apprec  Acute hypoxic respiratory failure: secondary to above. Weaned off of supplemental oxygen today   HTN urgency: w/ recent hx of uncontrolled HTN. Will continue on clondine & hydralazine. IV hydralazine prn. D/c coreg & restarted atenolol as per cardio. Will hold ACE-I secondary to AKI   AKI on CKDIIIb: Cr continues to trend up daily. Possibly ATN vs GN. Nephro following and recs apprec.  Renal US shows increased echotexture of right kidney. Autoimmune work pending. May need renal biopsy as per nephro. No HCTZ as this previously discontinued. Will hold home dose of ACE-I secondary to AKI. Will continue to monitor   Likely anemia of chronic disease: etiology unclear, possibly secondary to CKD. No need for a transfusion at this time. Will start iron supplements as pt's iron is low. Will continue to monitor   Vitamin B12 deficiency: will start B12 supplements.  Hypokalemia: KCl repleted. Will continue to monitor   Hyponatremia: etiology unclear, labile. Will continue to monitor   Secondary hyperparathyroidism: PTH pending   Leukocytosis: reactive vs infection. UA is positive for leukocytes, rare bacteria. Urine cx shows insufficient growth. No GU symptoms currently. 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 BMI 32.0, would benefit from weight loss   Elevated troponins: likely secondary to possible new onset CHF vs flash pulmonary edema   Thrombocytosis: resolved  Anxiety: severity unknown. Continue on home dose of klonipin   Constipation: continue on colace, dulcolax   DVT  prophylaxis: lovenox Code Status: full  Family Communication: discussed w/ pt's daughter who was at bedside and answered her questions Disposition Plan: will likely d/c back home when medically stable    Consultants:  Cardio nephro   Procedures:    Antimicrobials:    Subjective: Pt c/o malaise but feeling slightly better than day prior  Objective: Vitals:   03/18/20 1958 03/18/20 2243 03/19/20 0049 03/19/20 0619  BP:   (!) 121/57 (!) 131/58  Pulse:  (!) 105 92 94  Resp:   17   Temp:   98.3 F (36.8 C) 98.8 F (37.1 C)  TempSrc:    Oral  SpO2: 94% 100% 94% 95%  Weight:      Height:        Intake/Output Summary (Last 24 hours) at 03/19/2020 0722 Last data filed at 03/18/2020 1628 Gross per 24 hour  Intake 3 ml  Output 300 ml  Net -297 ml   Filed Weights   03/16/20 1520 03/16/20 1709  Weight: 68 kg 72 kg    Examination:  General exam: Appears calm and comfortable  Respiratory system: diminished breath sounds b/l/ No rhonchi Cardiovascular system: S1 & S2 +. No  rubs, gallops or clicks. B/L LE edema Gastrointestinal system: Abdomen is nondistended, soft and nontender. Hypoactive bowel sounds heard. Central nervous system: Alert and oriented. Moves all 4 extremities  Psychiatry: Judgement and insight appear normal. Flat mood and affect     Data Reviewed: I have personally reviewed following labs and imaging studies  CBC: Recent Labs  Lab 03/16/20 1522 03/17/20 0531 03/18/20 0101  WBC 18.7* 10.8* 12.6*  HGB 11.1* 9.0* 8.6*  HCT 32.0* 26.4* 24.9*  MCV 82.9 83.8 83.3  PLT 408* 307 376   Basic Metabolic Panel: Recent Labs  Lab 03/16/20 1522 03/17/20 0531 03/18/20 0101 03/19/20 0557  NA 128* 132* 128* 126*  K 3.5 3.1* 3.4* 3.0*  CL 93* 95* 90* 89*  CO2 24 26 28 25   GLUCOSE 148* 99 119* 105*  BUN 24* 29* 37* 41*  CREATININE 1.94* 2.33* 2.48* 2.70*  CALCIUM 8.8* 8.1* 8.3* 8.2*  MG  --  1.5*  --   --   PHOS  --   --   --  4.9*    GFR: Estimated Creatinine Clearance: 15.1 mL/min (A) (by C-G formula based on SCr of 2.7 mg/dL (H)). Liver Function Tests: No results for input(s): AST, ALT, ALKPHOS, BILITOT, PROT, ALBUMIN in the last 168 hours. No results for input(s): LIPASE, AMYLASE in the last 168 hours. No results for input(s): AMMONIA in the last 168 hours. Coagulation Profile: No results for input(s): INR, PROTIME in the last 168 hours. Cardiac Enzymes: No results for input(s): CKTOTAL, CKMB, CKMBINDEX, TROPONINI in the last 168 hours. BNP (last 3 results) No results for input(s): PROBNP in the last 8760 hours. HbA1C: Recent Labs    03/17/20 0531  HGBA1C 5.9*   CBG: No results for input(s): GLUCAP in the last 168 hours. Lipid Profile: No results for input(s): CHOL, HDL, LDLCALC, TRIG, CHOLHDL, LDLDIRECT in the last 72 hours. Thyroid Function Tests: No results for input(s): TSH, T4TOTAL, FREET4, T3FREE, THYROIDAB in the last 72 hours. Anemia Panel: Recent Labs    03/18/20 1635 03/19/20 0557  VITAMINB12 161*  --   FOLATE 9.1 8.4  FERRITIN 154 163  TIBC 230* 197*  IRON 24* 15*  RETICCTPCT 1.8 2.1   Sepsis Labs: No results for input(s): PROCALCITON, LATICACIDVEN in the last 168 hours.  Recent Results (from the past 240 hour(s))  Urine Culture     Status: Abnormal   Collection Time: 03/16/20  5:49 PM   Specimen: Urine, Random  Result Value Ref Range Status   Specimen Description   Final    URINE, RANDOM Performed at [facility], [address], [place], [place] [address]    Special Requests   Final    NONE Performed at [facility], [address], [place], [place] [address]    Culture (A)  Final    <10,000 COLONIES/mL INSIGNIFICANT GROWTH Performed at [facility] [address], [place], [place] [address]    Report Status 03/17/2020 FINAL  Final  SARS CORONAVIRUS 2 (TAT 6-24 HRS) Nasopharyngeal Nasopharyngeal Swab     Status: None   Collection Time:  03/16/20  6:00 PM   Specimen: Nasopharyngeal Swab  Result Value Ref Range Status   SARS Coronavirus 2 NEGATIVE NEGATIVE Final    Comment: (NOTE) SARS-CoV-2 target nucleic acids are NOT DETECTED. The SARS-CoV-2 RNA is generally detectable in upper and lower respiratory specimens during the acute phase of infection. Negative results do not preclude SARS-CoV-2 infection, do not rule out co-infections with other pathogens, and should not be used as the sole basis for treatment or other patient management decisions. Negative results must be combined with clinical observations, patient history, and epidemiological information. The expected result is Negative. Fact Sheet for Patients: SugarRoll.be Fact Sheet for Healthcare Providers: https://www.woods-mathews.com/ This test is not yet approved or cleared by the [place] FDA and  has been authorized for detection and/or diagnosis of SARS-CoV-2 by FDA under  an Emergency Use Authorization (EUA). This EUA will remain  in effect (meaning this test can be used) for the duration of the COVID-19 declaration under Section 56 4(b)(1) of the Act, 21 U.S.C. section 360bbb-3(b)(1), unless the authorization is terminated or revoked sooner. Performed at [facility], [place] [address] [place], [place] [address]          Radiology Studies: DG Chest 1 View  Result Date: 03/18/2020 CLINICAL DATA:  Acute tubular necrosis, history of shortness of breath EXAM: CHEST  1 VIEW COMPARISON:  03/16/2020 FINDINGS: Single frontal view of the chest demonstrates a stable cardiac silhouette. There is persistent central vascular congestion and interstitial prominence. New left basilar consolidation and small left effusion. No pneumothorax. IMPRESSION: 1. Progressive pulmonary edema, with new left pleural effusion. Electronically Signed   By: Randa Ngo M.D.   On: 03/18/2020 19:49   US RENAL  Result Date:  03/18/2020 CLINICAL DATA:  Acute tubular necrosis EXAM: RENAL / URINARY TRACT ULTRASOUND COMPLETE COMPARISON:  None. FINDINGS: Right Kidney: Renal measurements: 9 x 5.1 x 4.9 cm = volume: 117 mL. There is increased echotexture of right kidney. No focal mass or hydronephrosis is identified. Left Kidney: Renal measurements: 11.1 x 5.1 x 4.2 cm = volume: 126 mL. Echogenicity within normal limits. No mass or hydronephrosis visualized. Bladder: Appears normal for degree of bladder distention. Other: None. IMPRESSION: Increased echotexture of right kidney.  Otherwise negative. Electronically Signed   By: Abelardo Diesel M.D.   On: 03/18/2020 15:54   ECHOCARDIOGRAM COMPLETE  Result Date: 03/17/2020    ECHOCARDIOGRAM REPORT   Patient Name:   Linda Bradley Date of Exam: 03/17/2020 Medical Rec #:  [id number]       Height:       59.0 in Accession #:    [id number]      Weight:       158.7 lb Date of Birth:  [birth year]      BSA:          1.672 m Patient Age:    79 years        BP:           120/59 mmHg Patient Gender: F               HR:           98 bpm. Exam Location:  [facility] Procedure: 2D Echo, Color Doppler and Cardiac Doppler Indications:     R06.00 Dyspnea  History:         Patient has prior history of Echocardiogram examinations.                  Signs/Symptoms:Murmur; Risk Factors:Hypertension and HCL.  Sonographer:     Charmayne Sheer RDCS (AE) Referring Phys:  [id number] BRENDA MORRISON Diagnosing Phys: Isaias Cowman MD IMPRESSIONS  1. Left ventricular ejection fraction, by estimation, is 55 to 60%. The left ventricle has normal function. The left ventricle has no regional wall motion abnormalities. Left ventricular diastolic parameters are consistent with Grade I diastolic dysfunction (impaired relaxation).  2. Right ventricular systolic function is normal. The right ventricular size is normal.  3. The mitral valve is normal in structure. Mild mitral valve regurgitation. No evidence of mitral stenosis.  4. The aortic  valve is normal in structure. Aortic valve regurgitation is mild to moderate. 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  5. The inferior vena cava is normal in size with greater than 50% respiratory variability, suggesting right atrial pressure  of 3 mmHg. FINDINGS  Left Ventricle: Left ventricular ejection fraction, by estimation, is 55 to 60%. The left ventricle has normal function. The left ventricle has no regional wall motion abnormalities. The left ventricular internal cavity size was normal in size. There is  no left ventricular hypertrophy. Left ventricular diastolic parameters are consistent with Grade I diastolic dysfunction (impaired relaxation). Right Ventricle: The right ventricular size is normal. No increase in right ventricular wall thickness. Right ventricular systolic function is normal. Left Atrium: Left atrial size was normal in size. Right Atrium: Right atrial size was normal in size. Pericardium: There is no evidence of pericardial effusion. Mitral Valve: The mitral valve is normal in structure. Normal mobility of the mitral valve leaflets. Mild mitral valve regurgitation. No evidence of mitral valve stenosis. MV peak gradient, 7.4 mmHg. The mean mitral valve gradient is 3.0 mmHg. Tricuspid Valve: The tricuspid valve is normal in structure. Tricuspid valve regurgitation is mild . No evidence of tricuspid stenosis. Aortic Valve: The aortic valve is normal in structure. Aortic valve regurgitation is mild to moderate. Moderate to severe aortic stenosis is present. Aortic valve mean gradient measures 24.5 mmHg. Aortic valve peak gradient measures 43.2 mmHg. Aortic valve area, by VTI measures 0.99 cm. Pulmonic Valve: The pulmonic valve was normal in structure. Pulmonic valve regurgitation is not visualized. No evidence of pulmonic stenosis. Aorta: The aortic root is normal in size and structure. Venous: The inferior vena cava is normal in size with greater than 50% respiratory variability,  suggesting right atrial pressure of 3 mmHg. IAS/Shunts: No atrial level shunt detected by color flow Doppler.  LEFT VENTRICLE PLAX 2D LVIDd:         2.76 cm     Diastology LVIDs:         2.18 cm     LV e' lateral:   7.83 cm/s LV PW:         0.94 cm     LV E/e' lateral: 11.0 LV IVS:        0.92 cm     LV e' medial:    4.79 cm/s LVOT diam:     1.70 cm     LV E/e' medial:  17.9 LV SV:         61 LV SV Index:   37 LVOT Area:     2.27 cm  LV Volumes (MOD) LV vol d, MOD A2C: 45.9 ml LV vol d, MOD A4C: 53.5 ml LV vol s, MOD A2C: 18.3 ml LV vol s, MOD A4C: 20.8 ml LV SV MOD A2C:     27.6 ml LV SV MOD A4C:     53.5 ml LV SV MOD BP:      29.5 ml RIGHT VENTRICLE RV Basal diam:  2.94 cm LEFT ATRIUM             Index       RIGHT ATRIUM          Index LA diam:        3.00 cm 1.79 cm/m  RA Area:     7.50 cm LA Vol (A2C):   17.4 ml 10.41 ml/m RA Volume:   11.10 ml 6.64 ml/m LA Vol (A4C):   34.0 ml 20.34 ml/m LA Biplane Vol: 26.4 ml 15.79 ml/m  AORTIC VALVE                    PULMONIC VALVE AV Area (Vmax):    0.85 cm     PV  Vmax:       1.79 m/s AV Area (Vmean):   0.84 cm     PV Vmean:      119.000 cm/s AV Area (VTI):     0.99 cm     PV VTI:        0.339 m AV Vmax:           328.50 cm/s  PV Peak grad:  12.8 mmHg AV Vmean:          230.000 cm/s PV Mean grad:  7.0 mmHg AV VTI:            0.619 m AV Peak Grad:      43.2 mmHg AV Mean Grad:      24.5 mmHg LVOT Vmax:         123.00 cm/s LVOT Vmean:        85.600 cm/s LVOT VTI:          0.270 m LVOT/AV VTI ratio: 0.44  AORTA Ao Root diam: 3.00 cm MITRAL VALVE MV Area (PHT): 3.85 cm     SHUNTS MV Peak grad:  7.4 mmHg     Systemic VTI:  0.27 m MV Mean grad:  3.0 mmHg     Systemic Diam: 1.70 cm MV Vmax:       1.36 m/s MV Vmean:      85.3 cm/s MV Decel Time: 197 msec MV E velocity: 85.90 cm/s MV A velocity: 125.00 cm/s MV E/A ratio:  0.69 Isaias Cowman MD Electronically signed by Isaias Cowman MD Signature Date/Time: 03/17/2020/3:42:25 PM    Final         Scheduled  Meds: . aspirin EC  81 mg Oral Daily  . atenolol  50 mg Oral Daily  . clonazePAM  0.25 mg Oral BID  . cloNIDine  0.1 mg Oral TID  . docusate sodium  200 mg Oral BID  . enoxaparin (LOVENOX) injection  30 mg Subcutaneous Q24H  . furosemide  20 mg Intravenous Daily  . hydrALAZINE  50 mg Oral Q8H  . scopolamine  1 patch Transdermal Q72H  . sodium chloride flush  3 mL Intravenous Once  . sodium chloride flush  3 mL Intravenous Q12H   Continuous Infusions:   LOS: 3 days    Time spent: 32 mins    Wyvonnia Dusky, MD [facility] Pager [phone]  If 7PM-7AM, please contact night-coverage [email or web address] 03/19/2020, 7:22 AM  '

## 2020-03-19 NOTE — Progress Notes (Signed)
[facility] Cardiology  SUBJECTIVE: Patient sitting in chair, reports feeling better overall   Vitals:   03/18/20 2243 03/19/20 0049 03/19/20 0619 03/19/20 0809  BP:  (!) 121/57 (!) 131/58 116/77  Pulse: (!) 105 92 94 100  Resp:  17  16  Temp:  98.3 F (36.8 C) 98.8 F (37.1 C) 98.6 F (37 C)  TempSrc:   Oral Oral  SpO2: 100% 94% 95% 94%  Weight:      Height:         Intake/Output Summary (Last 24 hours) at 03/19/2020 0945 Last data filed at 03/18/2020 1628 Gross per 24 hour  Intake --  Output 300 ml  Net -300 ml      PHYSICAL EXAM  General: Well developed, well nourished, in no acute distress HEENT:  Normocephalic and atramatic Neck:  No JVD.  Lungs: Clear bilaterally to auscultation and percussion. Heart: HRRR . Normal S1 and S2 without gallops or murmurs.  Abdomen: Bowel sounds are positive, abdomen soft and non-tender  Msk:  Back normal, normal gait. Normal strength and tone for age. Extremities: No clubbing, cyanosis or edema.   Neuro: Alert and oriented X 3. Psych:  Good affect, responds appropriately   LABS: Basic Metabolic Panel: Recent Labs    03/17/20 0531 03/17/20 0531 03/18/20 0101 03/19/20 0557  NA 132*   < > 128* 126*  K 3.1*   < > 3.4* 3.0*  CL 95*   < > 90* 89*  CO2 26   < > 28 25  GLUCOSE 99   < > 119* 105*  BUN 29*   < > 37* 41*  CREATININE 2.33*   < > 2.48* 2.70*  CALCIUM 8.1*   < > 8.3* 8.2*  MG 1.5*  --   --   --   PHOS  --   --   --  4.9*   < > = values in this interval not displayed.   Liver Function Tests: No results for input(s): AST, ALT, ALKPHOS, BILITOT, PROT, ALBUMIN in the last 72 hours. No results for input(s): LIPASE, AMYLASE in the last 72 hours. CBC: Recent Labs    03/18/20 0101 03/19/20 0557  WBC 12.6* 14.6*  HGB 8.6* 8.4*  HCT 24.9* 24.3*  MCV 83.3 82.9  PLT 301 307   Cardiac Enzymes: No results for input(s): CKTOTAL, CKMB, CKMBINDEX, TROPONINI in the last 72 hours. BNP: Invalid input(s): POCBNP D-Dimer: No  results for input(s): DDIMER in the last 72 hours. Hemoglobin A1C: Recent Labs    03/17/20 0531  HGBA1C 5.9*   Fasting Lipid Panel: No results for input(s): CHOL, HDL, LDLCALC, TRIG, CHOLHDL, LDLDIRECT in the last 72 hours. Thyroid Function Tests: No results for input(s): TSH, T4TOTAL, T3FREE, THYROIDAB in the last 72 hours.  Invalid input(s): FREET3 Anemia Panel: Recent Labs    03/19/20 0557  VITAMINB12 149*  FOLATE 8.4  FERRITIN 163  TIBC 197*  IRON 15*  RETICCTPCT 2.1    DG Chest 1 View  Result Date: 03/18/2020 CLINICAL DATA:  Acute tubular necrosis, history of shortness of breath EXAM: CHEST  1 VIEW COMPARISON:  03/16/2020 FINDINGS: Single frontal view of the chest demonstrates a stable cardiac silhouette. There is persistent central vascular congestion and interstitial prominence. New left basilar consolidation and small left effusion. No pneumothorax. IMPRESSION: 1. Progressive pulmonary edema, with new left pleural effusion. Electronically Signed   By: Randa Ngo M.D.   On: 03/18/2020 19:49   US RENAL  Result Date: 03/18/2020 CLINICAL DATA:  Acute tubular necrosis EXAM: RENAL / URINARY TRACT ULTRASOUND COMPLETE COMPARISON:  None. FINDINGS: Right Kidney: Renal measurements: 9 x 5.1 x 4.9 cm = volume: 117 mL. There is increased echotexture of right kidney. No focal mass or hydronephrosis is identified. Left Kidney: Renal measurements: 11.1 x 5.1 x 4.2 cm = volume: 126 mL. Echogenicity within normal limits. No mass or hydronephrosis visualized. Bladder: Appears normal for degree of bladder distention. Other: None. IMPRESSION: Increased echotexture of right kidney.  Otherwise negative. Electronically Signed   By: Abelardo Diesel M.D.   On: 03/18/2020 15:54   ECHOCARDIOGRAM COMPLETE  Result Date: 03/17/2020    ECHOCARDIOGRAM REPORT   Patient Name:   Linda Bradley Date of Exam: 03/17/2020 Medical Rec #:  [id number]       Height:       59.0 in Accession #:    [id number]       Weight:       158.7 lb Date of Birth:  [birth year]      BSA:          1.672 m Patient Age:    79 years        BP:           120/59 mmHg Patient Gender: F               HR:           98 bpm. Exam Location:  [facility] Procedure: 2D Echo, Color Doppler and Cardiac Doppler Indications:     R06.00 Dyspnea  History:         Patient has prior history of Echocardiogram examinations.                  Signs/Symptoms:Murmur; Risk Factors:Hypertension and HCL.  Sonographer:     Charmayne Sheer RDCS (AE) Referring Phys:  [id number] BRENDA MORRISON Diagnosing Phys: Isaias Cowman MD IMPRESSIONS  1. Left ventricular ejection fraction, by estimation, is 55 to 60%. The left ventricle has normal function. The left ventricle has no regional wall motion abnormalities. Left ventricular diastolic parameters are consistent with Grade I diastolic dysfunction (impaired relaxation).  2. Right ventricular systolic function is normal. The right ventricular size is normal.  3. The mitral valve is normal in structure. Mild mitral valve regurgitation. No evidence of mitral stenosis.  4. The aortic valve is normal in structure. Aortic valve regurgitation is mild to moderate. 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  5. The inferior vena cava is normal in size with greater than 50% respiratory variability, suggesting right atrial pressure of 3 mmHg. FINDINGS  Left Ventricle: Left ventricular ejection fraction, by estimation, is 55 to 60%. The left ventricle has normal function. The left ventricle has no regional wall motion abnormalities. The left ventricular internal cavity size was normal in size. There is  no left ventricular hypertrophy. Left ventricular diastolic parameters are consistent with Grade I diastolic dysfunction (impaired relaxation). Right Ventricle: The right ventricular size is normal. No increase in right ventricular wall thickness. Right ventricular systolic function is normal. Left Atrium: Left atrial size was normal in size. Right  Atrium: Right atrial size was normal in size. Pericardium: There is no evidence of pericardial effusion. Mitral Valve: The mitral valve is normal in structure. Normal mobility of the mitral valve leaflets. Mild mitral valve regurgitation. No evidence of mitral valve stenosis. MV peak gradient, 7.4 mmHg. The mean mitral valve gradient is 3.0 mmHg. Tricuspid Valve: The tricuspid valve is normal in structure. Tricuspid valve regurgitation is  mild . No evidence of tricuspid stenosis. Aortic Valve: The aortic valve is normal in structure. Aortic valve regurgitation is mild to moderate. Moderate to severe aortic stenosis is present. Aortic valve mean gradient measures 24.5 mmHg. Aortic valve peak gradient measures 43.2 mmHg. Aortic valve area, by VTI measures 0.99 cm. Pulmonic Valve: The pulmonic valve was normal in structure. Pulmonic valve regurgitation is not visualized. No evidence of pulmonic stenosis. Aorta: The aortic root is normal in size and structure. Venous: The inferior vena cava is normal in size with greater than 50% respiratory variability, suggesting right atrial pressure of 3 mmHg. IAS/Shunts: No atrial level shunt detected by color flow Doppler.  LEFT VENTRICLE PLAX 2D LVIDd:         2.76 cm     Diastology LVIDs:         2.18 cm     LV e' lateral:   7.83 cm/s LV PW:         0.94 cm     LV E/e' lateral: 11.0 LV IVS:        0.92 cm     LV e' medial:    4.79 cm/s LVOT diam:     1.70 cm     LV E/e' medial:  17.9 LV SV:         61 LV SV Index:   37 LVOT Area:     2.27 cm  LV Volumes (MOD) LV vol d, MOD A2C: 45.9 ml LV vol d, MOD A4C: 53.5 ml LV vol s, MOD A2C: 18.3 ml LV vol s, MOD A4C: 20.8 ml LV SV MOD A2C:     27.6 ml LV SV MOD A4C:     53.5 ml LV SV MOD BP:      29.5 ml RIGHT VENTRICLE RV Basal diam:  2.94 cm LEFT ATRIUM             Index       RIGHT ATRIUM          Index LA diam:        3.00 cm 1.79 cm/m  RA Area:     7.50 cm LA Vol (A2C):   17.4 ml 10.41 ml/m RA Volume:   11.10 ml 6.64 ml/m LA  Vol (A4C):   34.0 ml 20.34 ml/m LA Biplane Vol: 26.4 ml 15.79 ml/m  AORTIC VALVE                    PULMONIC VALVE AV Area (Vmax):    0.85 cm     PV Vmax:       1.79 m/s AV Area (Vmean):   0.84 cm     PV Vmean:      119.000 cm/s AV Area (VTI):     0.99 cm     PV VTI:        0.339 m AV Vmax:           328.50 cm/s  PV Peak grad:  12.8 mmHg AV Vmean:          230.000 cm/s PV Mean grad:  7.0 mmHg AV VTI:            0.619 m AV Peak Grad:      43.2 mmHg AV Mean Grad:      24.5 mmHg LVOT Vmax:         123.00 cm/s LVOT Vmean:        85.600 cm/s LVOT VTI:  0.270 m LVOT/AV VTI ratio: 0.44  AORTA Ao Root diam: 3.00 cm MITRAL VALVE MV Area (PHT): 3.85 cm     SHUNTS MV Peak grad:  7.4 mmHg     Systemic VTI:  0.27 m MV Mean grad:  3.0 mmHg     Systemic Diam: 1.70 cm MV Vmax:       1.36 m/s MV Vmean:      85.3 cm/s MV Decel Time: 197 msec MV E velocity: 85.90 cm/s MV A velocity: 125.00 cm/s MV E/A ratio:  0.69 Isaias Cowman MD Electronically signed by Isaias Cowman MD Signature Date/Time: 03/17/2020/3:42:25 PM    Final      Echo LVEF 55 to 60%, moderate severe aortic stenosis, AVA 0.99 cm, mean gradient 24.5 mmHg, peak gradient 43.2 mmHg  TELEMETRY: Sinus rhythm:  ASSESSMENT AND PLAN:  Active Problems:   Dyspnea    1.  Dyspnea, chest x-ray revealing pulmonary edema with bilateral pleural effusions in the setting of anemia and acute kidney injury, improved today 2.  Acute diastolic congestive heart failure, in patient with labile blood pressure and moderate severe aortic stenosis, currently appears euvolemic 3.  Aortic stenosis, moderate to severe, of uncertain clinical significance, likely not contributing to current symptoms 4.  Borderline elevated troponin, in the absence of chest pain, likely demand supply ischemia, secondary to acute diastolic CHF, uncontrolled hypertension, and acute kidney injury 5.  Essential hypertension, blood pressure currently well controlled on once daily  atenolol 6.  Anemia, work-up per Dr. Jimmye Norman 7.  AKI on CKD, probable secondary to ATN, work-up per Dr. Theador Hawthorne  Recommendations  1.  Agree with current therapy 2.  Gentle diuresis 3.  Carefully monitor renal status 4.  Replete potassium 5.  Continue atenolol 50 mg daily   Isaias Cowman, MD, PhD, [facility] 03/19/2020 9:45 AM

## 2020-03-19 NOTE — Progress Notes (Signed)
Linda Bradley  MRN: [id number]  DOB/AGE: November 20, 1941 79 y.o.  Primary Care Physician:Bliss, Lynnell Jude, MD  Admit date: 03/16/2020  Chief Complaint:  Chief Complaint  Patient presents with  . Shortness of Breath  . Hypertension    S-Pt presented on  03/16/2020 with  Chief Complaint  Patient presents with  . Shortness of Breath  . Hypertension  . Patient states she is feeling better than before.  Patient's daughter as always is present in the room. I discussed lab results with patient and her daughter at length.  I did discuss the patient about possibly requiring renal biopsy  Medications . aspirin EC  81 mg Oral Daily  . atenolol  50 mg Oral Daily  . clonazePAM  0.25 mg Oral BID  . cloNIDine  0.1 mg Oral TID  . docusate sodium  200 mg Oral BID  . enoxaparin (LOVENOX) injection  30 mg Subcutaneous Q24H  . furosemide  20 mg Intravenous Daily  . hydrALAZINE  50 mg Oral Q8H  . scopolamine  1 patch Transdermal Q72H  . sodium chloride flush  3 mL Intravenous Once  . sodium chloride flush  3 mL Intravenous Q12H         GMW:NUUVO from the symptoms mentioned above,there are no other symptoms referable to all systems reviewed.  Physical Exam: Vital signs in last 24 hours: Temp:  [98.3 F (36.8 C)-99.6 F (37.6 C)] 98.6 F (37 C) (03/28 0809) Pulse Rate:  [92-105] 100 (03/28 0809) Resp:  [16-20] 16 (03/28 0809) BP: (116-171)/(57-77) 116/77 (03/28 0809) SpO2:  [94 %-100 %] 94 % (03/28 0809) Weight change:  Last BM Date: 03/16/20  Intake/Output from previous day: 03/27 0701 - 03/28 0700 In: 3 [I.V.:3] Out: 300 [Urine:300] No intake/output data recorded.   Physical Exam: General- pt is awake,alert, oriented to time place and person Resp- No acute REsp distress, decreased breath sound at bases  CVS- S1S2 regular in rate and rhythm GIT- BS+, soft, NT, ND EXT- NO LE Edema, Cyanosis   Lab Results: CBC Recent Labs    03/18/20 0101 03/19/20 0557  WBC 12.6*  14.6*  HGB 8.6* 8.4*  HCT 24.9* 24.3*  PLT 301 307    BMET Recent Labs    03/18/20 0101 03/19/20 0557  NA 128* 126*  K 3.4* 3.0*  CL 90* 89*  CO2 28 25  GLUCOSE 119* 105*  BUN 37* 41*  CREATININE 2.48* 2.70*  CALCIUM 8.3* 8.2*    Creatinine trend 2021 1.9==>2.5==>2.7  Data from care everywhere at [facility] 1.1   MICRO Recent Results (from the past 240 hour(s))  Urine Culture     Status: Abnormal   Collection Time: 03/16/20  5:49 PM   Specimen: Urine, Random  Result Value Ref Range Status   Specimen Description   Final    URINE, RANDOM Performed at [facility], [address], [place], [place] [address]    Special Requests   Final    NONE Performed at [facility], [address] [place], [place] [address]    Culture (A)  Final    <10,000 COLONIES/mL INSIGNIFICANT GROWTH Performed at [facility], [place] [address], [place], [place] [address]    Report Status 03/17/2020 FINAL  Final  SARS CORONAVIRUS 2 (TAT 6-24 HRS) Nasopharyngeal Nasopharyngeal Swab     Status: None   Collection Time: 03/16/20  6:00 PM   Specimen: Nasopharyngeal Swab  Result Value Ref Range Status  SARS Coronavirus 2 NEGATIVE NEGATIVE Final    Comment: (NOTE) SARS-CoV-2 target nucleic acids are NOT DETECTED. The SARS-CoV-2 RNA is generally detectable in upper and lower respiratory specimens during the acute phase of infection. Negative results do not preclude SARS-CoV-2 infection, do not rule out co-infections with other pathogens, and should not be used as the sole basis for treatment or other patient management decisions. Negative results must be combined with clinical observations, patient history, and epidemiological information. The expected result is Negative. Fact Sheet for Patients: SugarRoll.be Fact Sheet for Healthcare Providers: https://www.woods-mathews.com/ This test is not yet  approved or cleared by the [place] FDA and  has been authorized for detection and/or diagnosis of SARS-CoV-2 by FDA under an Emergency Use Authorization (EUA). This EUA will remain  in effect (meaning this test can be used) for the duration of the COVID-19 declaration under Section 56 4(b)(1) of the Act, 21 U.S.C. section 360bbb-3(b)(1), unless the authorization is terminated or revoked sooner. Performed at [facility], [place] [address], [place], [place] [address]       Lab Results  Component Value Date   CALCIUM 8.2 (L) 03/19/2020   PHOS 4.9 (H) 03/19/2020     Renal ultrasound done today Right Kidney:  Renal measurements: 9 x 5.1 x 4.9 cm = volume: 117 mL. There is increased echotexture of right kidney. No focal mass or hydronephrosis is identified.  Left Kidney:  Renal measurements: 11.1 x 5.1 x 4.2 cm = volume: 126 mL. Echogenicity within normal limits. No mass or hydronephrosis visualized.  Bladder:  Appears normal for degree of bladder distention.  Other:  None.  IMPRESSION: Increased echotexture of right kidney. Otherwise negative.  Impression:   Patient is a 79 year old Caucasian female with a past medical history of hypertension, aortic stenosis, hyperlipidemia, CKD who came to the hospital with chief complaint of shortness of breath patient was diagnosed with pleural effusion, pulmonary edema, hypertensive urgency, anemia, hypokalemia, hyponatremia and elevated troponins  1)Renal  AKI secondary to ATN/GN  Data in favor of ATN Decreased p.o. intake plus ACE on board  Data in favor of GN is Patient has a history of COVID-19 Patient UA showed patient has hematuria Patient ESR is on the higher side GN from COVID-19?    AKI secondary to minimal-change disease? Data in favor of minimal-change disease is  acute onset of AKI and much change in urine protein Patient UA has no proteinuria when checked in 2017 and now has more  than 300. Patient spot urine protein creatinine ratio is only gram minimal-change disease is unlikely     HUS? It was part of differential yesterday  Data in favor of HUS is rise in creatinine and rapid fall in hemoglobin Data against HUS as patient does not have thrombocytopenia or any change in neurological function. But HUS unlikely as NO rise in LDH. Haptoglobin pending     Patient has AKI on CKD. 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 Patient has CKD stage III most likely secondary to hypertension.    Renal ultrasound done today ruled out postobstructive issues   Autoimmune work up pending ESR near 80   Proteinuria results pending      2)HTN Patient blood pressure is now much better controlled  3)Anemia of chronic disease  HGb is not at goal (9--11)  Iron and B12 deficiency  Will replete    4) secondary hyperparathyroidism -CKD Mineral-Bone Disorder   secondary hyperparathyroidism present  5) hypokalemia Patient KCl is being replete  6) hyponatremia Hypervolemic hyponatremia Patient was admitted with pleural effusion and pulmonary edema. Patient currently appears euvolemic Chest x-ray showed worsening Pul edema  Will increase diuretics   7)Acid base Co2 at goal  8) elevated troponins Cardiology and primary team are following closely   Plan:  Will replete B12 and iron will replete Kcl Will increase diuretics as patient chest x-ray has shown increasing edema Will plan for Biopsy on Monday/Tuesday  I had extensive discussion with the patient and her daughter regarding different lab results which came back.  I also informed about the results which are still pending and possibly patient requiring renal biopsy depending upon her clinical course in the next day or 2   Kendrick Haapala s [facility] 03/19/2020, 9:38 AM

## 2020-03-20 DIAGNOSIS — I5031 Acute diastolic (congestive) heart failure: Secondary | ICD-10-CM

## 2020-03-20 LAB — BASIC METABOLIC PANEL
Anion gap: 13 (ref 5–15)
BUN: 49 mg/dL — ABNORMAL HIGH (ref 8–23)
CO2: 25 mmol/L (ref 22–32)
Calcium: 8.5 mg/dL — ABNORMAL LOW (ref 8.9–10.3)
Chloride: 88 mmol/L — ABNORMAL LOW (ref 98–111)
Creatinine, Ser: 3.04 mg/dL — ABNORMAL HIGH (ref 0.44–1.00)
GFR calc Af Amer: 16 mL/min — ABNORMAL LOW (ref >60)
GFR calc non Af Amer: 14 mL/min — ABNORMAL LOW (ref >60)
Glucose, Bld: 104 mg/dL — ABNORMAL HIGH (ref 70–99)
Potassium: 3.7 mmol/L (ref 3.5–5.1)
Sodium: 126 mmol/L — ABNORMAL LOW (ref 135–145)

## 2020-03-20 LAB — CBC
HCT: 23.8 % — ABNORMAL LOW (ref 36.0–46.0)
Hemoglobin: 8.3 g/dL — ABNORMAL LOW (ref 12.0–15.0)
MCH: 28.9 pg (ref 26.0–34.0)
MCHC: 34.9 g/dL (ref 30.0–36.0)
MCV: 82.9 fL (ref 80.0–100.0)
Platelets: 335 10*3/uL (ref 150–400)
RBC: 2.87 MIL/uL — ABNORMAL LOW (ref 3.87–5.11)
RDW: 12.5 % (ref 11.5–15.5)
WBC: 12.4 10*3/uL — ABNORMAL HIGH (ref 4.0–10.5)
nRBC: 0 % (ref 0.0–0.2)

## 2020-03-20 LAB — ANTINUCLEAR ANTIBODIES, IFA: ANA Ab, IFA: POSITIVE — AB

## 2020-03-20 LAB — FANA STAINING PATTERNS: Homogeneous Pattern: 1:1280 {titer} — ABNORMAL HIGH

## 2020-03-20 MED ORDER — HEPARIN SODIUM (PORCINE) 5000 UNIT/ML IJ SOLN
5000.0000 [IU] | Freq: Three times a day (TID) | INTRAMUSCULAR | Status: DC
  Administered 2020-03-21 – 2020-03-22 (×4): 5000 [IU] via SUBCUTANEOUS
  Filled 2020-03-20 (×4): qty 1

## 2020-03-20 MED ORDER — SCOPOLAMINE 1 MG/3DAYS TD PT72
1.0000 | MEDICATED_PATCH | TRANSDERMAL | Status: DC
  Administered 2020-03-20 – 2020-03-26 (×3): 1.5 mg via TRANSDERMAL
  Filled 2020-03-20 (×3): qty 1

## 2020-03-20 NOTE — Progress Notes (Signed)
[facility]  ROUNDING NOTE   Subjective:  Patient resting comfortably in bed. Continues to have significant renal dysfunction as creatinine up to 3. Still awaiting serologic work-up.She also has a significant number of RBCs in the urine.   Objective:  Vital signs in last 24 hours:  Temp:  [98.2 F (36.8 C)-98.9 F (37.2 C)] 98.3 F (36.8 C) (03/29 1615) Pulse Rate:  [78-86] 78 (03/29 1615) Resp:  [16-17] 17 (03/29 1615) BP: (93-148)/(53-67) 138/63 (03/29 1615) SpO2:  [91 %-94 %] 93 % (03/29 1615)  Weight change:  Filed Weights   03/16/20 1520 03/16/20 1709  Weight: 68 kg 72 kg    Intake/Output: I/O last 3 completed shifts: In: 720 [P.O.:720] Out: -    Intake/Output this shift:  No intake/output data recorded.  Physical Exam: General: No acute distress  Head: Normocephalic, atraumatic. Moist oral mucosal membranes  Eyes: Anicteric  Neck: Supple, trachea midline  Lungs:  Clear to auscultation, normal effort  Heart: S1S2 no rubs  Abdomen:  Soft, nontender, bowel sounds present  Extremities:  peripheral edema.  Neurologic: Awake, alert, following commands  Skin: No lesions  Access: None    Basic Metabolic Panel: Recent Labs  Lab 03/16/20 1522 03/16/20 1522 03/17/20 0531 03/17/20 0531 03/18/20 0101 03/19/20 0557 03/20/20 0438  NA 128*  --  132*  --  128* 126* 126*  K 3.5  --  3.1*  --  3.4* 3.0* 3.7  CL 93*  --  95*  --  90* 89* 88*  CO2 24  --  26  --  28 25 25   GLUCOSE 148*  --  99  --  119* 105* 104*  BUN 24*  --  29*  --  37* 41* 49*  CREATININE 1.94*  --  2.33*  --  2.48* 2.70* 3.04*  CALCIUM 8.8*   < > 8.1*   < > 8.3* 8.2* 8.5*  MG  --   --  1.5*  --   --   --   --   PHOS  --   --   --   --   --  4.9*  --    < > = values in this interval not displayed.    Liver Function Tests: No results for input(s): AST, ALT, ALKPHOS, BILITOT, PROT, ALBUMIN in the last 168 hours. No results for input(s): LIPASE, AMYLASE in the last 168  hours. No results for input(s): AMMONIA in the last 168 hours.  CBC: Recent Labs  Lab 03/16/20 1522 03/17/20 0531 03/18/20 0101 03/19/20 0557 03/20/20 0438  WBC 18.7* 10.8* 12.6* 14.6* 12.4*  HGB 11.1* 9.0* 8.6* 8.4* 8.3*  HCT 32.0* 26.4* 24.9* 24.3* 23.8*  MCV 82.9 83.8 83.3 82.9 82.9  PLT 408* 307 301 307 335    Cardiac Enzymes: No results for input(s): CKTOTAL, CKMB, CKMBINDEX, TROPONINI in the last 168 hours.  BNP: Invalid input(s): POCBNP  CBG: No results for input(s): GLUCAP in the last 168 hours.  Microbiology: Results for orders placed or performed during the hospital encounter of 03/16/20  Urine Culture     Status: Abnormal   Collection Time: 03/16/20  5:49 PM   Specimen: Urine, Random  Result Value Ref Range Status   Specimen Description   Final    URINE, RANDOM Performed at [facility], [address], [place], [place] [address]    Special Requests   Final    NONE Performed at [facility], [address], [place], [place] [address]  Culture (A)  Final    <10,000 COLONIES/mL INSIGNIFICANT GROWTH Performed at [facility] [address] [place], [place] [address]    Report Status 03/17/2020 FINAL  Final  SARS CORONAVIRUS 2 (TAT 6-24 HRS) Nasopharyngeal Nasopharyngeal Swab     Status: None   Collection Time: 03/16/20  6:00 PM   Specimen: Nasopharyngeal Swab  Result Value Ref Range Status   SARS Coronavirus 2 NEGATIVE NEGATIVE Final    Comment: (NOTE) SARS-CoV-2 target nucleic acids are NOT DETECTED. The SARS-CoV-2 RNA is generally detectable in upper and lower respiratory specimens during the acute phase of infection. Negative results do not preclude SARS-CoV-2 infection, do not rule out co-infections with other pathogens, and should not be used as the sole basis for treatment or other patient management decisions. Negative results must be combined with clinical observations, patient history, and epidemiological  information. The expected result is Negative. Fact Sheet for Patients: SugarRoll.be Fact Sheet for Healthcare Providers: https://www.woods-mathews.com/ This test is not yet approved or cleared by the [place] FDA and  has been authorized for detection and/or diagnosis of SARS-CoV-2 by FDA under an Emergency Use Authorization (EUA). This EUA will remain  in effect (meaning this test can be used) for the duration of the COVID-19 declaration under Section 56 4(b)(1) of the Act, 21 U.S.C. section 360bbb-3(b)(1), unless the authorization is terminated or revoked sooner. Performed at [facility], [place] [address], [place], [place] [address]     Coagulation Studies: No results for input(s): LABPROT, INR in the last 72 hours.  Urinalysis: Recent Labs    03/19/20 1148  COLORURINE YELLOW*  LABSPEC 1.011  PHURINE 5.0  GLUCOSEU NEGATIVE  HGBUR LARGE*  BILIRUBINUR NEGATIVE  KETONESUR NEGATIVE  PROTEINUR 100*  NITRITE NEGATIVE  LEUKOCYTESUR MODERATE*      Imaging: No results found.   Medications:   . ferumoxytol 510 mg (03/19/20 1159)   . atenolol  50 mg Oral Daily  . bisacodyl  5 mg Oral Daily  . clonazePAM  0.25 mg Oral BID  . cloNIDine  0.1 mg Oral TID  . docusate sodium  200 mg Oral BID  . furosemide  40 mg Intravenous Daily  . [START ON 03/21/2020] heparin injection (subcutaneous)  5,000 Units Subcutaneous Q8H  . hydrALAZINE  50 mg Oral Q8H  . scopolamine  1 patch Transdermal Q72H  . sodium chloride flush  3 mL Intravenous Once  . sodium chloride flush  3 mL Intravenous Q12H   acetaminophen **OR** acetaminophen, hydrALAZINE, magnesium hydroxide, ondansetron (ZOFRAN) IV, ondansetron, traMADol  Assessment/ Plan:  79 y.o. female with past medical history of hypertension, aortic stenosis, hyperlipidemia, chronic kidney disease stage III a baseline creatinine 1.1 who presented to [facility] with  hypertensive urgency, pulmonary edema, shortness of breath and found to have worsening acute kidney injury.  1.  Acute kidney injury/chronic kidney disease stage IIIa.  Baseline creatinine 1.1.  Now appears to have proteinuria, hematuria and rising creatinine.  Glomerulonephritis is certainly on the differential.  She has a very elevated ANA with 1: 1280 titer.  Therefore lupus nephritis is high on the differential.  We recommend renal biopsy but she was still on aspirin as of this morning.  Tentatively we will plan for renal biopsy on Thursday.  Continue to monitor renal parameters in the interim closely.  No urgent indication for dialysis.  2.  Anemia of chronic kidney disease.  Hemoglobin 8.3.  Hold off on Epogen for now.  3.  Hyponatremia.  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  Okay to continue furosemide 40 mg IV daily for now.  4.  Hypertension.  Maintain the patient on clonidine and hydralazine for now.   LOS: 4 Kue Fox 3/29/20217:51 PM

## 2020-03-20 NOTE — Progress Notes (Signed)
[facility] Cardiology    SUBJECTIVE: The patient reports feeling much better this morning. Her breathing has improved, she denies peripheral edema, chest pain, or palpitations.   Vitals:   03/19/20 2111 03/19/20 2329 03/20/20 0501 03/20/20 0745  BP: (!) 148/67 (!) 93/53 (!) 147/67 129/60  Pulse: 86 82 82 79  Resp:  17 16   Temp: 98.9 F (37.2 C) 98.3 F (36.8 C)  98.2 F (36.8 C)  TempSrc: Oral   Oral  SpO2: 94% 92% 93% 91%  Weight:      Height:         Intake/Output Summary (Last 24 hours) at 03/20/2020 0818 Last data filed at 03/19/2020 1424 Gross per 24 hour  Intake 480 ml  Output --  Net 480 ml      PHYSICAL EXAM  General: Well developed, well nourished, in no acute distress, sitting up in recliner HEENT:  Normocephalic and atramatic Neck:  No JVD.  Lungs: Normal effort of breathing on room air, mild basilar crackles L>R, no wheezing Heart: HRRR . 2/6 harsh systolic murmur Abdomen: nondistended Msk:  Back normal, gait not assessed. No obvious abnormalities. Extremities: No clubbing, cyanosis or edema.   Neuro: Alert and oriented X 3. Psych:  Good affect, responds appropriately   LABS: Basic Metabolic Panel: Recent Labs    03/19/20 0557 03/20/20 0438  NA 126* 126*  K 3.0* 3.7  CL 89* 88*  CO2 25 25  GLUCOSE 105* 104*  BUN 41* 49*  CREATININE 2.70* 3.04*  CALCIUM 8.2* 8.5*  PHOS 4.9*  --    Liver Function Tests: No results for input(s): AST, ALT, ALKPHOS, BILITOT, PROT, ALBUMIN in the last 72 hours. No results for input(s): LIPASE, AMYLASE in the last 72 hours. CBC: Recent Labs    03/19/20 0557 03/20/20 0438  WBC 14.6* 12.4*  HGB 8.4* 8.3*  HCT 24.3* 23.8*  MCV 82.9 82.9  PLT 307 335   Cardiac Enzymes: No results for input(s): CKTOTAL, CKMB, CKMBINDEX, TROPONINI in the last 72 hours. BNP: Invalid input(s): POCBNP D-Dimer: No results for input(s): DDIMER in the last 72 hours. Hemoglobin A1C: No results for input(s): HGBA1C in the last 72  hours. Fasting Lipid Panel: No results for input(s): CHOL, HDL, LDLCALC, TRIG, CHOLHDL, LDLDIRECT in the last 72 hours. Thyroid Function Tests: No results for input(s): TSH, T4TOTAL, T3FREE, THYROIDAB in the last 72 hours.  Invalid input(s): FREET3 Anemia Panel: Recent Labs    03/19/20 0557  VITAMINB12 149*  FOLATE 8.4  FERRITIN 163  TIBC 197*  IRON 15*  RETICCTPCT 2.1    DG Chest 1 View  Result Date: 03/18/2020 CLINICAL DATA:  Acute tubular necrosis, history of shortness of breath EXAM: CHEST  1 VIEW COMPARISON:  03/16/2020 FINDINGS: Single frontal view of the chest demonstrates a stable cardiac silhouette. There is persistent central vascular congestion and interstitial prominence. New left basilar consolidation and small left effusion. No pneumothorax. IMPRESSION: 1. Progressive pulmonary edema, with new left pleural effusion. Electronically Signed   By: Randa Ngo M.D.   On: 03/18/2020 19:49   US RENAL  Result Date: 03/18/2020 CLINICAL DATA:  Acute tubular necrosis EXAM: RENAL / URINARY TRACT ULTRASOUND COMPLETE COMPARISON:  None. FINDINGS: Right Kidney: Renal measurements: 9 x 5.1 x 4.9 cm = volume: 117 mL. There is increased echotexture of right kidney. No focal mass or hydronephrosis is identified. Left Kidney: Renal measurements: 11.1 x 5.1 x 4.2 cm = volume: 126 mL. Echogenicity within normal limits. No mass or hydronephrosis  visualized. Bladder: Appears normal for degree of bladder distention. Other: None. IMPRESSION: Increased echotexture of right kidney.  Otherwise negative. Electronically Signed   By: Abelardo Diesel M.D.   On: 03/18/2020 15:54     Echo: normal LV function with LVEF 55-60% with grade I diastolic dysfunction, without LVH, mild to moderate AI, moderate to severe AS  TELEMETRY: sinus rhythm, 73 bpm  ASSESSMENT AND PLAN:  Active Problems:   Dyspnea    1. Dyspnea, much better today with diuresis.  2. Acute diastolic CHF, in patient with labile  hypertension and moderate to severe aortic stenosis. Clinically improved. 3. AKI on CKD, nephrology following. Creatinine today is 3.04. 4. 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 5. Anemia, work-up per internist. 6. Essential hypertension, better after resuming atenolol. 7. Borderline elevated troponin, in the absence of chest pain, likely demand supply ischemia, secondary to acute diastolic CHF, uncontrolled HTN, and AKI 8. Hypokalemia, resolved with repletion  Recommendations: 1. Agree with current therapy 2. Diuretic recommendations per nephrology 3. Continue atenolol 50 mg daily, clonidine, hydralazine. Continue to hold ACEi or ARB in light of AKI 4. No further cardiac diagnostics recommended at this time. 5. Please have patient follow-up with Dr. Edwin Dada as outpatient in 1 week from discharge.   Sign off for now; please call with any questions.   Clabe Seal, PA-C 03/20/2020 8:18 AM

## 2020-03-20 NOTE — Progress Notes (Signed)
PROGRESS NOTE    Linda Bradley  OMV:[id number] DOB: 1941-10-27 DOA: 03/16/2020 PCP: Lynnell Jude, MD       Assessment & Plan:   Active Problems:   Dyspnea  Dyspnea: possibly secondary to new acute diastolic CHF vs flash pulmonary edema. Much improved. Continue on IV lasix. Monitor I/Os. Echo shows EF 62-83%, grade I diastolic dysfunction, & moderate to severe aortic stenosis. Cardio following and recs apprec  Acute hypoxic respiratory failure: secondary to above. Weaned off of supplemental oxygen. Resolved   HTN urgency: w/ recent hx of uncontrolled HTN. Will continue on clondine & hydralazine. IV hydralazine prn. D/c coreg & restarted atenolol as per cardio. Will hold ACE-I secondary to AKI   AKI on CKDIIIb: Cr continues to rise. Possibly ATN vs GN. Nephro following and recs apprec.  Renal US shows increased echotexture of right kidney. Autoimmune work pending. ESR is elevated. May need renal biopsy as per nephro. No HCTZ as this previously discontinued. Will hold home dose of ACE-I secondary to AKI. Will continue to monitor   Likely anemia of chronic disease: etiology unclear, possibly secondary to CKD. No need for a transfusion at this time. Will continue on iron supplements as pt's iron is low. Will continue to monitor   Vitamin B12 deficiency: s/p IM B12 supplement  Hypokalemia: WNL today. Will continue to monitor   Hyponatremia: etiology unclear, labile. Will continue to monitor   Secondary hyperparathyroidism: PTH pending   Leukocytosis: reactive vs infection. UA is positive for leukocytes, rare bacteria. Urine cx shows insufficient growth. No GU symptoms currently. 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 BMI 32.0, would benefit from weight loss   Elevated troponins: likely secondary to possible new onset CHF vs flash pulmonary edema   Thrombocytosis: resolved  Anxiety: severity unknown. Continue on home dose of klonipin   Constipation: continue on  colace, dulcolax   DVT prophylaxis: lovenox Code Status: full  Family Communication: discussed w/ pt's daughter, Clarise Cruz, and answered her questions Disposition Plan: will likely d/c back home when medically stable    Consultants:  Cardio nephro   Procedures:    Antimicrobials:    Subjective: Pt c/o intermittent nausea   Objective: Vitals:   03/19/20 1640 03/19/20 2111 03/19/20 2329 03/20/20 0501  BP: (!) 157/69 (!) 148/67 (!) 93/53 (!) 147/67  Pulse: 84 86 82 82  Resp:   17 16  Temp:  98.9 F (37.2 C) 98.3 F (36.8 C)   TempSrc:  Oral    SpO2: 90% 94% 92% 93%  Weight:      Height:        Intake/Output Summary (Last 24 hours) at 03/20/2020 0719 Last data filed at 03/19/2020 1424 Gross per 24 hour  Intake 480 ml  Output -  Net 480 ml   Filed Weights   03/16/20 1520 03/16/20 1709  Weight: 68 kg 72 kg    Examination:  General exam: Appears calm and comfortable  Respiratory system: decreased breath sounds b/l/. No wheezes Cardiovascular system: S1 & S2 +. No  rubs, gallops or clicks.  Gastrointestinal system: Abdomen is nondistended, soft and nontender. Normal bowel sounds heard. Central nervous system: Alert and oriented. Moves all 4 extremities  Psychiatry: Judgement and insight appear normal. Normal mood and affect    Data Reviewed: I have personally reviewed following labs and imaging studies  CBC: Recent Labs  Lab 03/16/20 1522 03/17/20 0531 03/18/20 0101 03/19/20 0557 03/20/20 0438  WBC 18.7* 10.8* 12.6* 14.6* 12.4*  HGB 11.1* 9.0* 8.6* 8.4* 8.3*  HCT 32.0* 26.4* 24.9* 24.3* 23.8*  MCV 82.9 83.8 83.3 82.9 82.9  PLT 408* 307 301 307 828   Basic Metabolic Panel: Recent Labs  Lab 03/16/20 1522 03/17/20 0531 03/18/20 0101 03/19/20 0557 03/20/20 0438  NA 128* 132* 128* 126* 126*  K 3.5 3.1* 3.4* 3.0* 3.7  CL 93* 95* 90* 89* 88*  CO2 24 26 28 25 25   GLUCOSE 148* 99 119* 105* 104*  BUN 24* 29* 37* 41* 49*  CREATININE 1.94* 2.33* 2.48*  2.70* 3.04*  CALCIUM 8.8* 8.1* 8.3* 8.2* 8.5*  MG  --  1.5*  --   --   --   PHOS  --   --   --  4.9*  --    GFR: Estimated Creatinine Clearance: 13.4 mL/min (A) (by C-G formula based on SCr of 3.04 mg/dL (H)). Liver Function Tests: No results for input(s): AST, ALT, ALKPHOS, BILITOT, PROT, ALBUMIN in the last 168 hours. No results for input(s): LIPASE, AMYLASE in the last 168 hours. No results for input(s): AMMONIA in the last 168 hours. Coagulation Profile: No results for input(s): INR, PROTIME in the last 168 hours. Cardiac Enzymes: No results for input(s): CKTOTAL, CKMB, CKMBINDEX, TROPONINI in the last 168 hours. BNP (last 3 results) No results for input(s): PROBNP in the last 8760 hours. HbA1C: No results for input(s): HGBA1C in the last 72 hours. CBG: No results for input(s): GLUCAP in the last 168 hours. Lipid Profile: No results for input(s): CHOL, HDL, LDLCALC, TRIG, CHOLHDL, LDLDIRECT in the last 72 hours. Thyroid Function Tests: No results for input(s): TSH, T4TOTAL, FREET4, T3FREE, THYROIDAB in the last 72 hours. Anemia Panel: Recent Labs    03/18/20 1635 03/19/20 0557  VITAMINB12 161* 149*  FOLATE 9.1 8.4  FERRITIN 154 163  TIBC 230* 197*  IRON 24* 15*  RETICCTPCT 1.8 2.1   Sepsis Labs: No results for input(s): PROCALCITON, LATICACIDVEN in the last 168 hours.  Recent Results (from the past 240 hour(s))  Urine Culture     Status: Abnormal   Collection Time: 03/16/20  5:49 PM   Specimen: Urine, Random  Result Value Ref Range Status   Specimen Description   Final    URINE, RANDOM Performed at [facility], [address], [place], [place] [address]    Special Requests   Final    NONE Performed at [facility], [address], [place], [place] [address]    Culture (A)  Final    <10,000 COLONIES/mL INSIGNIFICANT GROWTH Performed at [facility] [address], [place], [place] [address]    Report Status 03/17/2020 FINAL   Final  SARS CORONAVIRUS 2 (TAT 6-24 HRS) Nasopharyngeal Nasopharyngeal Swab     Status: None   Collection Time: 03/16/20  6:00 PM   Specimen: Nasopharyngeal Swab  Result Value Ref Range Status   SARS Coronavirus 2 NEGATIVE NEGATIVE Final    Comment: (NOTE) SARS-CoV-2 target nucleic acids are NOT DETECTED. The SARS-CoV-2 RNA is generally detectable in upper and lower respiratory specimens during the acute phase of infection. Negative results do not preclude SARS-CoV-2 infection, do not rule out co-infections with other pathogens, and should not be used as the sole basis for treatment or other patient management decisions. Negative results must be combined with clinical observations, patient history, and epidemiological information. The expected result is Negative. Fact Sheet for Patients: SugarRoll.be Fact Sheet for Healthcare Providers: https://www.woods-mathews.com/ This test is not yet approved or  cleared by the [place] and  has been authorized for detection and/or diagnosis of SARS-CoV-2 by FDA under an Emergency Use Authorization (EUA). This EUA will remain  in effect (meaning this test can be used) for the duration of the COVID-19 declaration under Section 56 4(b)(1) of the Act, 21 U.S.C. section 360bbb-3(b)(1), unless the authorization is terminated or revoked sooner. Performed at [facility], [place] [address], [place], [place] [address]          Radiology Studies: DG Chest 1 View  Result Date: 03/18/2020 CLINICAL DATA:  Acute tubular necrosis, history of shortness of breath EXAM: CHEST  1 VIEW COMPARISON:  03/16/2020 FINDINGS: Single frontal view of the chest demonstrates a stable cardiac silhouette. There is persistent central vascular congestion and interstitial prominence. New left basilar consolidation and small left effusion. No pneumothorax. IMPRESSION: 1. Progressive pulmonary edema, with new left pleural  effusion. Electronically Signed   By: Randa Ngo M.D.   On: 03/18/2020 19:49   US RENAL  Result Date: 03/18/2020 CLINICAL DATA:  Acute tubular necrosis EXAM: RENAL / URINARY TRACT ULTRASOUND COMPLETE COMPARISON:  None. FINDINGS: Right Kidney: Renal measurements: 9 x 5.1 x 4.9 cm = volume: 117 mL. There is increased echotexture of right kidney. No focal mass or hydronephrosis is identified. Left Kidney: Renal measurements: 11.1 x 5.1 x 4.2 cm = volume: 126 mL. Echogenicity within normal limits. No mass or hydronephrosis visualized. Bladder: Appears normal for degree of bladder distention. Other: None. IMPRESSION: Increased echotexture of right kidney.  Otherwise negative. Electronically Signed   By: Abelardo Diesel M.D.   On: 03/18/2020 15:54        Scheduled Meds: . aspirin EC  81 mg Oral Daily  . atenolol  50 mg Oral Daily  . bisacodyl  5 mg Oral Daily  . clonazePAM  0.25 mg Oral BID  . cloNIDine  0.1 mg Oral TID  . docusate sodium  200 mg Oral BID  . enoxaparin (LOVENOX) injection  30 mg Subcutaneous Q24H  . furosemide  40 mg Intravenous Daily  . hydrALAZINE  50 mg Oral Q8H  . scopolamine  1 patch Transdermal Q72H  . sodium chloride flush  3 mL Intravenous Once  . sodium chloride flush  3 mL Intravenous Q12H   Continuous Infusions: . ferumoxytol 510 mg (03/19/20 1159)     LOS: 4 days    Time spent: 30 mins    Wyvonnia Dusky, MD [facility] Pager [phone]  If 7PM-7AM, please contact night-coverage [email or web address] 03/20/2020, 7:19 AM  '

## 2020-03-20 NOTE — Progress Notes (Signed)
Occupational Therapy Treatment Patient Details Name: Linda Bradley MRN: [id number] DOB: 08/03/1941 Today's Date: 03/20/2020    History of present illness Per MD note: Linda Bradley is a 79 y.o. female with a history of anxiety, aortic stenosis, hypertension who comes the ED complaining of shortness of breath.  She has been feeling sick for the past 2 weeks with fatigue, decreased appetite, decreased oral intake.  MD assessment: Dyspnea, Acute hypoxic respiratory failure, HTN urgency, AKI, hypokalemia, hyponatremia, leukocytosis, elevated troponins likely secondary to new CHF vs flash pulmonary edema, and thrombocytosis. Of note: COVID in January.   OT comments  Pt seen for OT treatment this date to f/u re: independence and safety with self care as well as energy conservation. Pt up in restroom with two CNAs assisting d/t messy BM. OT encourages care team to allow pt to do as much for herself as possible and checks pt's O2 in standing as pt is now off nasal cannula and reports breathing better now. Pt's SpO2 at 93% on RA. Pt performs fxl mobility back to bed with SBA with G balance demonstrated with no AD. OT provides minimal assistance for LB dressing for pt to don clean underwear standing at bed side with pt placing one hand on countertop to stabilize. Pt then sits EOB with supv only, demonstrating good control for transfer. While pt seated EOB and pt's daughter present in room, OT facilitates education re: EC techniques and considerations to apply in home setting upon d/c from hospital. Pt and dtr verbalize good understanding. Pt overall improving. Do not anticipate need for OT f/u outside of acute setting.    Follow Up Recommendations  No OT follow up    Equipment Recommendations  None recommended by OT    Recommendations for Other Services      Precautions / Restrictions Precautions Precautions: Fall Restrictions Weight Bearing Restrictions: No       Mobility Bed Mobility                General bed mobility comments: Pt up in restroom with CNA when OT presents, sitting EOB with Normal static sitting balance with daughter present in room when OT left  Transfers Overall transfer level: Needs assistance Equipment used: None Transfers: Sit to/from Stand Sit to Stand: Supervision         General transfer comment: Pt demos good control with sit<>stand    Balance Overall balance assessment: No apparent balance deficits (not formally assessed)                                         ADL either performed or assessed with clinical judgement   ADL                                         General ADL Comments: Pt able to complete UB ADLs with setup, still requiring MIN A for LB dressing including threading feet through mesh underwear while in sitting/standing. OT educates re: EC techniques including use of reacher for LB dressing.     Vision Baseline Vision/History: Wears glasses Wears Glasses: Reading only Patient Visual Report: No change from baseline     Perception     Praxis      Cognition Arousal/Alertness: Awake/alert Behavior During Therapy: WFL for tasks assessed/performed Overall Cognitive  Status: Within Functional Limits for tasks assessed                                          Exercises Other Exercises Other Exercises: OT facilitates education with pt and daughter re: EC techniques for self care ADLs. Pt and daughter verbalize good understanding. Handout issued.   Shoulder Instructions       General Comments      Pertinent Vitals/ Pain       Pain Assessment: No/denies pain  Home Living                                          Prior Functioning/Environment              Frequency  Min 1X/week        Progress Toward Goals  OT Goals(current goals can now be found in the care plan section)  Progress towards OT goals: Progressing toward  goals  Acute Rehab OT Goals Patient Stated Goal: to get my breathing under control and go home OT Goal Formulation: With patient/family Time For Goal Achievement: 03/31/20 Potential to Achieve Goals: Good  Plan Discharge plan remains appropriate    Co-evaluation                 AM-PAC OT "6 Clicks" Daily Activity     Outcome Measure   Help from another person eating meals?: None Help from another person taking care of personal grooming?: None Help from another person toileting, which includes using toliet, bedpan, or urinal?: A Little Help from another person bathing (including washing, rinsing, drying)?: A Little Help from another person to put on and taking off regular upper body clothing?: None Help from another person to put on and taking off regular lower body clothing?: A Little 6 Click Score: 21    End of Session Equipment Utilized During Treatment: Gait belt  OT Visit Diagnosis: Unsteadiness on feet (R26.81);Muscle weakness (generalized) (M62.81)   Activity Tolerance Patient tolerated treatment well   Patient Left in bed;with call bell/phone within reach;with family/visitor present(sitting EOB)   Nurse Communication Mobility status        Time: 1115-5208 OT Time Calculation (min): 28 min  Charges: OT General Charges $OT Visit: 1 Visit OT Treatments $Self Care/Home Management : 23-37 mins  Gerrianne Scale, [place], OTR/L ascom [phone] 03/20/20, 10:37 AM

## 2020-03-20 NOTE — Care Management Important Message (Signed)
Important Message  Patient Details  Name: Linda Bradley MRN: [id number] Date of Birth: 08/07/1941   Medicare Important Message Given:  Yes     Dannette Barbara 03/20/2020, 11:28 AM

## 2020-03-20 NOTE — Progress Notes (Signed)
Physical Therapy Treatment Patient Details Name: Linda Bradley MRN: [id number] DOB: 31-Dec-1940 Today's Date: 03/20/2020    History of Present Illness Per MD note: Linda Bradley is a 79 y.o. female with a history of anxiety, aortic stenosis, hypertension who comes the ED complaining of shortness of breath.  She has been feeling sick for the past 2 weeks with fatigue, decreased appetite, decreased oral intake.  MD assessment: Dyspnea, Acute hypoxic respiratory failure, HTN urgency, AKI, hypokalemia, hyponatremia, leukocytosis, elevated troponins likely secondary to new CHF vs flash pulmonary edema, and thrombocytosis. Of note: COVID in January.    PT Comments    Pt pleasant and motivated to participate during the session. Pt did not require any physical assistance during bed mobility, transfers, amb, or stair training. Pt ambulated with short step length, minimal arm swing, and decreased gait speed but was safe ambulating without an AD. Pt is appropriate for continued PT services during her hospital stay to prevent functional decline but does not require PT services after discharge.     Follow Up Recommendations  No PT follow up     Equipment Recommendations  None recommended by PT    Recommendations for Other Services       Precautions / Restrictions Precautions Precautions: Fall Restrictions Weight Bearing Restrictions: No    Mobility  Bed Mobility Overal bed mobility: Modified Independent               Transfers Overall transfer level: Needs assistance Equipment used: None Transfers: Sit to/from Stand Sit to Stand: Supervision         General transfer comment: Pt demos good control with sit<>stand  Ambulation/Gait Ambulation/Gait assistance: Supervision Gait Distance (Feet): 80 Feetx1, 12 feet x1 Assistive device: None Gait Pattern/deviations: Step-through pattern;Decreased step length - right;Decreased step length - left Gait velocity: decreased    General Gait Details: Slow, cautious cadence with pt occasionally reaching for light touch to railings or furniture but did not appear to require this for support or stability. Pt self-limited amb distance this monring secondary to upset stomach earlier in the morning and already having been up several times recently.   Stairs Stairs: Yes Stairs assistance: Supervision Stair Management: Alternating pattern;Step to pattern;Forwards;Two rails Number of Stairs: 4 General stair comments: Alternating on way up, step-to on way down. No cueing or physical assistance needed   Wheelchair Mobility    Modified Rankin (Stroke Patients Only)       Balance Overall balance assessment: No apparent balance deficits (not formally assessed)                                          Cognition Arousal/Alertness: Awake/alert Behavior During Therapy: WFL for tasks assessed/performed Overall Cognitive Status: Within Functional Limits for tasks assessed                                        Exercises Total Joint Exercises Ankle Circles/Pumps: Strengthening;Both;15 reps Heel Slides: AROM;Strengthening;Both;10 reps Hip ABduction/ADduction: Strengthening;Both;10 reps;AROM Straight Leg Raises: Strengthening;Both;10 reps;AROM Long Arc Quad: Strengthening;Both;10 reps;AROM Other Exercises    General Comments        Pertinent Vitals/Pain Pain Assessment: No/denies pain    Home Living  Prior Function            PT Goals (current goals can now be found in the care plan section) Acute Rehab PT Goals Patient Stated Goal: to get my breathing under control and go home Progress towards PT goals: Progressing toward goals    Frequency    Min 2X/week      PT Plan Current plan remains appropriate    Co-evaluation              AM-PAC PT "6 Clicks" Mobility   Outcome Measure  Help needed turning from your back to your  side while in a flat bed without using bedrails?: None Help needed moving from lying on your back to sitting on the side of a flat bed without using bedrails?: None Help needed moving to and from a bed to a chair (including a wheelchair)?: None Help needed standing up from a chair using your arms (e.g., wheelchair or bedside chair)?: None Help needed to walk in hospital room?: A Little Help needed climbing 3-5 steps with a railing? : A Little 6 Click Score: 22    End of Session Equipment Utilized During Treatment: Gait belt Activity Tolerance: Patient tolerated treatment well Patient left: in bed;with family/visitor present Nurse Communication: Mobility status PT Visit Diagnosis: Muscle weakness (generalized) (M62.81);Unsteadiness on feet (R26.81)     Time: 9244-6286 PT Time Calculation (min) (ACUTE ONLY): 20 min  Charges:                        Annabelle Harman, SPT 03/20/20 1:16 PM

## 2020-03-21 DIAGNOSIS — E876 Hypokalemia: Secondary | ICD-10-CM

## 2020-03-21 DIAGNOSIS — D638 Anemia in other chronic diseases classified elsewhere: Secondary | ICD-10-CM

## 2020-03-21 DIAGNOSIS — N179 Acute kidney failure, unspecified: Secondary | ICD-10-CM

## 2020-03-21 LAB — BASIC METABOLIC PANEL
Anion gap: 13 (ref 5–15)
BUN: 54 mg/dL — ABNORMAL HIGH (ref 8–23)
CO2: 26 mmol/L (ref 22–32)
Calcium: 8.6 mg/dL — ABNORMAL LOW (ref 8.9–10.3)
Chloride: 86 mmol/L — ABNORMAL LOW (ref 98–111)
Creatinine, Ser: 3.18 mg/dL — ABNORMAL HIGH (ref 0.44–1.00)
GFR calc Af Amer: 16 mL/min — ABNORMAL LOW (ref >60)
GFR calc non Af Amer: 13 mL/min — ABNORMAL LOW (ref >60)
Glucose, Bld: 95 mg/dL (ref 70–99)
Potassium: 3.3 mmol/L — ABNORMAL LOW (ref 3.5–5.1)
Sodium: 125 mmol/L — ABNORMAL LOW (ref 135–145)

## 2020-03-21 LAB — ANTI-DNA ANTIBODY, DOUBLE-STRANDED: ds DNA Ab: 1 IU/mL (ref 0–9)

## 2020-03-21 LAB — CBC
HCT: 24.5 % — ABNORMAL LOW (ref 36.0–46.0)
Hemoglobin: 8.3 g/dL — ABNORMAL LOW (ref 12.0–15.0)
MCH: 28.2 pg (ref 26.0–34.0)
MCHC: 33.9 g/dL (ref 30.0–36.0)
MCV: 83.3 fL (ref 80.0–100.0)
Platelets: 354 10*3/uL (ref 150–400)
RBC: 2.94 MIL/uL — ABNORMAL LOW (ref 3.87–5.11)
RDW: 12.5 % (ref 11.5–15.5)
WBC: 11.1 10*3/uL — ABNORMAL HIGH (ref 4.0–10.5)
nRBC: 0 % (ref 0.0–0.2)

## 2020-03-21 LAB — PROTEIN / CREATININE RATIO, URINE
Creatinine, Urine: 25 mg/dL
Protein Creatinine Ratio: 0.72 mg/mg{Cre} — ABNORMAL HIGH (ref 0.00–0.15)
Total Protein, Urine: 18 mg/dL

## 2020-03-21 LAB — PTH, INTACT AND CALCIUM
Calcium, Total (PTH): 8.3 mg/dL — ABNORMAL LOW (ref 8.7–10.3)
PTH: 32 pg/mL (ref 15–65)

## 2020-03-21 LAB — MPO/PR-3 (ANCA) ANTIBODIES
ANCA Proteinase 3: <3.5 U/mL (ref 0.0–3.5)
Myeloperoxidase Abs: 12.5 U/mL — ABNORMAL HIGH (ref 0.0–9.0)

## 2020-03-21 LAB — GLOMERULAR BASEMENT MEMBRANE ANTIBODIES: GBM Ab: 4 units (ref 0–20)

## 2020-03-21 LAB — HAPTOGLOBIN: Haptoglobin: 209 mg/dL (ref 42–346)

## 2020-03-21 LAB — MICROALBUMIN / CREATININE URINE RATIO
Creatinine, Urine: 70.9 mg/dL
Microalb Creat Ratio: 398 mg/g creat — ABNORMAL HIGH (ref 0–29)
Microalb, Ur: 282.4 ug/mL — ABNORMAL HIGH

## 2020-03-21 LAB — C4 COMPLEMENT: Complement C4, Body Fluid: 18 mg/dL (ref 12–38)

## 2020-03-21 LAB — COMPLEMENT, TOTAL: Compl, Total (CH50): 40 U/mL — ABNORMAL LOW (ref >41)

## 2020-03-21 LAB — C3 COMPLEMENT: C3 Complement: 96 mg/dL (ref 82–167)

## 2020-03-21 MED ORDER — POTASSIUM CHLORIDE 10 MEQ/100ML IV SOLN
10.0000 meq | INTRAVENOUS | Status: AC
  Administered 2020-03-21 (×3): 10 meq via INTRAVENOUS
  Filled 2020-03-21 (×3): qty 100

## 2020-03-21 NOTE — Progress Notes (Signed)
PROGRESS NOTE    Linda Bradley  GGE:[id number] DOB: 01-28-1941 DOA: 03/16/2020 PCP: Lynnell Jude, MD       Assessment & Plan:   Active Problems:   Dyspnea  Dyspnea: possibly secondary to new acute diastolic CHF vs flash pulmonary edema. Much improved. Continue on IV lasix. Monitor I/Os. Echo shows EF 46-50%, grade I diastolic dysfunction, & moderate to severe aortic stenosis. Cardio following and recs apprec  Acute hypoxic respiratory failure: secondary to above. Weaned off of supplemental oxygen. Resolved   HTN urgency: w/ recent hx of uncontrolled HTN. Will continue on clondine & hydralazine. IV hydralazine prn. D/c coreg & restarted atenolol as per cardio. Will hold ACE-I secondary to AKI   AKI on CKDIIIb: Cr continues to rise. Possibly ATN vs GN. Nephro following and recs apprec.  Renal US shows increased echotexture of right kidney. Autoimmune work pending. ESR is elevated. ANA is elevated. Decreased total complement. Possible renal biopsy on Thursday as per nephro. No HCTZ as this previously discontinued. Will hold home dose of ACE-I secondary to AKI. Will continue to monitor   Likely anemia of chronic disease: etiology unclear, possibly secondary to CKD. No need for a transfusion at this time. Will continue on iron supplements as pt's iron is low. Will continue to monitor   Vitamin B12 deficiency: s/p IM B12 supplement  Hypokalemia: will defer to nephro as pt's Cr continues to rise daily. 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 Will continue to monitor   Leukocytosis: reactive vs infection. Trending down. UA is positive for leukocytes, rare bacteria. Urine cx shows insufficient growth. No GU symptoms currently. 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 BMI 32.0, would benefit from weight loss   Elevated troponins: likely secondary to possible new onset CHF vs flash pulmonary edema   Secondary hyperparathyroidism: management as  per nephro.   Thrombocytosis: resolved  Anxiety: severity unknown. Continue on home dose of klonipin   Constipation: continue on colace, dulcolax   DVT prophylaxis: lovenox Code Status: full  Family Communication: called pt's daughter, Clarise Cruz, no answer so I left a message  Disposition Plan: will likely d/c back home when medically stable; plan for renal biopsy on 03/23/20   Consultants:  Cardio nephro   Procedures:    Antimicrobials:    Subjective: Pt c/o weakness  Objective: Vitals:   03/20/20 0745 03/20/20 1615 03/20/20 2230 03/21/20 0310  BP: 129/60 138/63 (!) 174/77 (!) 160/71  Pulse: 79 78  79  Resp:  17  18  Temp: 98.2 F (36.8 C) 98.3 F (36.8 C)  98.3 F (36.8 C)  TempSrc: Oral Oral  Oral  SpO2: 91% 93%  95%  Weight:      Height:        Intake/Output Summary (Last 24 hours) at 03/21/2020 3546 Last data filed at 03/20/2020 2200 Gross per 24 hour  Intake 360 ml  Output --  Net 360 ml   Filed Weights   03/16/20 1520 03/16/20 1709  Weight: 68 kg 72 kg    Examination:  General exam: Appears calm and comfortable  Respiratory system: diminished breath sounds b/l/. No rales Cardiovascular system: S1 & S2 +. No  rubs, gallops or clicks.  Gastrointestinal system: Abdomen is nondistended, soft and nontender. Normal bowel sounds heard. Central nervous system: Alert and oriented. Moves all 4 extremities  Psychiatry: Judgement and insight appear normal. Normal mood and affect    Data Reviewed: I have personally reviewed  following labs and imaging studies  CBC: Recent Labs  Lab 03/17/20 0531 03/18/20 0101 03/19/20 0557 03/20/20 0438 03/21/20 0551  WBC 10.8* 12.6* 14.6* 12.4* 11.1*  HGB 9.0* 8.6* 8.4* 8.3* 8.3*  HCT 26.4* 24.9* 24.3* 23.8* 24.5*  MCV 83.8 83.3 82.9 82.9 83.3  PLT 307 301 307 335 176   Basic Metabolic Panel: Recent Labs  Lab 03/17/20 0531 03/17/20 0531 03/18/20 0101 03/19/20 0557 03/20/20 0438 03/21/20 0551  NA 132*  --   128* 126* 126* 125*  K 3.1*  --  3.4* 3.0* 3.7 3.3*  CL 95*  --  90* 89* 88* 86*  CO2 26  --  28 25 25 26   GLUCOSE 99  --  119* 105* 104* 95  BUN 29*  --  37* 41* 49* 54*  CREATININE 2.33*  --  2.48* 2.70* 3.04* 3.18*  CALCIUM 8.1*   < > 8.3* 8.2*  8.3* 8.5* 8.6*  MG 1.5*  --   --   --   --   --   PHOS  --   --   --  4.9*  --   --    < > = values in this interval not displayed.   GFR: Estimated Creatinine Clearance: 12.8 mL/min (A) (by C-G formula based on SCr of 3.18 mg/dL (H)). Liver Function Tests: No results for input(s): AST, ALT, ALKPHOS, BILITOT, PROT, ALBUMIN in the last 168 hours. No results for input(s): LIPASE, AMYLASE in the last 168 hours. No results for input(s): AMMONIA in the last 168 hours. Coagulation Profile: No results for input(s): INR, PROTIME in the last 168 hours. Cardiac Enzymes: No results for input(s): CKTOTAL, CKMB, CKMBINDEX, TROPONINI in the last 168 hours. BNP (last 3 results) No results for input(s): PROBNP in the last 8760 hours. HbA1C: No results for input(s): HGBA1C in the last 72 hours. CBG: No results for input(s): GLUCAP in the last 168 hours. Lipid Profile: No results for input(s): CHOL, HDL, LDLCALC, TRIG, CHOLHDL, LDLDIRECT in the last 72 hours. Thyroid Function Tests: No results for input(s): TSH, T4TOTAL, FREET4, T3FREE, THYROIDAB in the last 72 hours. Anemia Panel: Recent Labs    03/18/20 1635 03/19/20 0557  VITAMINB12 161* 149*  FOLATE 9.1 8.4  FERRITIN 154 163  TIBC 230* 197*  IRON 24* 15*  RETICCTPCT 1.8 2.1   Sepsis Labs: No results for input(s): PROCALCITON, LATICACIDVEN in the last 168 hours.  Recent Results (from the past 240 hour(s))  Urine Culture     Status: Abnormal   Collection Time: 03/16/20  5:49 PM   Specimen: Urine, Random  Result Value Ref Range Status   Specimen Description   Final    URINE, RANDOM Performed at [facility], [address] [place], [place] [address]    Special Requests    Final    NONE Performed at [facility], [address], [place], [place] [address]    Culture (A)  Final    <10,000 COLONIES/mL INSIGNIFICANT GROWTH Performed at [facility] [address], [place], [place] [address]    Report Status 03/17/2020 FINAL  Final  SARS CORONAVIRUS 2 (TAT 6-24 HRS) Nasopharyngeal Nasopharyngeal Swab     Status: None   Collection Time: 03/16/20  6:00 PM   Specimen: Nasopharyngeal Swab  Result Value Ref Range Status   SARS Coronavirus 2 NEGATIVE NEGATIVE Final    Comment: (NOTE) SARS-CoV-2 target nucleic acids are NOT DETECTED. The SARS-CoV-2 RNA is generally detectable in upper and lower  respiratory specimens during the acute phase of infection. Negative results do not preclude SARS-CoV-2 infection, do not rule out co-infections with other pathogens, and should not be used as the sole basis for treatment or other patient management decisions. Negative results must be combined with clinical observations, patient history, and epidemiological information. The expected result is Negative. Fact Sheet for Patients: SugarRoll.be Fact Sheet for Healthcare Providers: https://www.woods-mathews.com/ This test is not yet approved or cleared by the [place] FDA and  has been authorized for detection and/or diagnosis of SARS-CoV-2 by FDA under an Emergency Use Authorization (EUA). This EUA will remain  in effect (meaning this test can be used) for the duration of the COVID-19 declaration under Section 56 4(b)(1) of the Act, 21 U.S.C. section 360bbb-3(b)(1), unless the authorization is terminated or revoked sooner. Performed at [facility], [place] [address], [place], [place] [address]          Radiology Studies: No results found.      Scheduled Meds: . atenolol  50 mg Oral Daily  . bisacodyl  5 mg Oral Daily  . clonazePAM  0.25 mg Oral BID  . cloNIDine  0.1 mg Oral TID  . docusate  sodium  200 mg Oral BID  . furosemide  40 mg Intravenous Daily  . heparin injection (subcutaneous)  5,000 Units Subcutaneous Q8H  . hydrALAZINE  50 mg Oral Q8H  . scopolamine  1 patch Transdermal Q72H  . sodium chloride flush  3 mL Intravenous Once  . sodium chloride flush  3 mL Intravenous Q12H   Continuous Infusions: . ferumoxytol 510 mg (03/19/20 1159)     LOS: 5 days    Time spent: 30 mins    Wyvonnia Dusky, MD [facility] Pager [phone]  If 7PM-7AM, please contact night-coverage [email or web address] 03/21/2020, 7:12 AM  '

## 2020-03-21 NOTE — Progress Notes (Signed)
Physical Therapy Treatment Patient Details Name: Linda Bradley MRN: [id number] DOB: 1941/03/09 Today's Date: 03/21/2020    History of Present Illness Per MD note: Linda Bradley is a 79 y.o. female with a history of anxiety, aortic stenosis, hypertension who comes the ED complaining of shortness of breath.  She has been feeling sick for the past 2 weeks with fatigue, decreased appetite, decreased oral intake.  MD assessment: Dyspnea, Acute hypoxic respiratory failure, HTN urgency, AKI, hypokalemia, hyponatremia, leukocytosis, elevated troponins likely secondary to new CHF vs flash pulmonary edema, and thrombocytosis. Of note: COVID in January.    PT Comments    Pt reported feeling tired and worn out. With some motivation provided by Pryor Curia, pt agreed to complete in-bed exercises. After completion, pt refused any EOB or OOB exercises and stated the wishes to rest. Pt is appropriate for continued PT services during her hospital stay to prevent functional decline but based on previous sessions, pt does not require PT services after discharge.       Follow Up Recommendations  No PT follow up     Equipment Recommendations  None recommended by PT    Recommendations for Other Services       Precautions / Restrictions Precautions Precautions: Fall Restrictions Weight Bearing Restrictions: No    Mobility  Bed Mobility               General bed mobility comments: NT secondary to pt feeling warn-out and not wanting to sit up  Transfers                    Ambulation/Gait                 Stairs             Wheelchair Mobility    Modified Rankin (Stroke Patients Only)       Balance                                            Cognition Arousal/Alertness: Awake/alert Behavior During Therapy: WFL for tasks assessed/performed Overall Cognitive Status: Within Functional Limits for tasks assessed                                        Exercises Total Joint Exercises Ankle Circles/Pumps: Strengthening;Both;15 reps Quad Sets: Strengthening;Both;10 reps Gluteal Sets: Strengthening;Both;10 reps Heel Slides: AROM;Strengthening;Both;10 reps Hip ABduction/ADduction: Strengthening;Both;10 reps;AROM Straight Leg Raises: Strengthening;Both;10 reps;AROM    General Comments        Pertinent Vitals/Pain Pain Assessment: No/denies pain    Home Living                      Prior Function            PT Goals (current goals can now be found in the care plan section) Progress towards PT goals: Progressing toward goals    Frequency    Min 2X/week      PT Plan Current plan remains appropriate    Co-evaluation              AM-PAC PT "6 Clicks" Mobility   Outcome Measure  Help needed turning from your back to your side while in a flat bed without using bedrails?: None Help needed moving  from lying on your back to sitting on the side of a flat bed without using bedrails?: None Help needed moving to and from a bed to a chair (including a wheelchair)?: A Little Help needed standing up from a chair using your arms (e.g., wheelchair or bedside chair)?: A Little Help needed to walk in hospital room?: A Little Help needed climbing 3-5 steps with a railing? : A Little 6 Click Score: 20    End of Session Equipment Utilized During Treatment: Gait belt Activity Tolerance: Patient limited by fatigue(Pt reported feeling tired and worn out and not wanting to do more than in-bed exercises) Patient left: in bed;with family/visitor present;with nursing/sitter in room;with call bell/phone within reach Nurse Communication: Mobility status PT Visit Diagnosis: Muscle weakness (generalized) (M62.81);Unsteadiness on feet (R26.81)     Time: 3903-0092 PT Time Calculation (min) (ACUTE ONLY): 14 min  Charges:                        Annabelle Harman, SPT 03/21/20 5:11 PM

## 2020-03-21 NOTE — Progress Notes (Signed)
[facility]  ROUNDING NOTE   Subjective:  Renal function appears to be worsening as creatinine up to 3.18 with a BUN of 54. Serum sodium also slightly lower today at 125. Potassium also a bit low at 3.3.   Objective:  Vital signs in last 24 hours:  Temp:  [98.3 F (36.8 C)] 98.3 F (36.8 C) (03/30 0735) Pulse Rate:  [78-79] 79 (03/30 0735) Resp:  [17-18] 18 (03/30 0310) BP: (138-174)/(63-77) 156/65 (03/30 0735) SpO2:  [92 %-95 %] 92 % (03/30 0735)  Weight change:  Filed Weights   03/16/20 1520 03/16/20 1709  Weight: 68 kg 72 kg    Intake/Output: I/O last 3 completed shifts: In: 360 [P.O.:360] Out: -    Intake/Output this shift:  Total I/O In: 480 [P.O.:480] Out: -   Physical Exam: General: No acute distress  Head: Normocephalic, atraumatic. Moist oral mucosal membranes  Eyes: Anicteric  Neck: Supple, trachea midline  Lungs:  Clear to auscultation, normal effort  Heart: S1S2 no rubs  Abdomen:  Soft, nontender, bowel sounds present  Extremities: Trace peripheral edema.  Neurologic: Awake, alert, following commands  Skin: Mild Maller rash on the face  Access: None    Basic Metabolic Panel: Recent Labs  Lab 03/17/20 0531 03/17/20 0531 03/18/20 0101 03/18/20 0101 03/19/20 0557 03/20/20 0438 03/21/20 0551  NA 132*  --  128*  --  126* 126* 125*  K 3.1*  --  3.4*  --  3.0* 3.7 3.3*  CL 95*  --  90*  --  89* 88* 86*  CO2 26  --  28  --  25 25 26   GLUCOSE 99  --  119*  --  105* 104* 95  BUN 29*  --  37*  --  41* 49* 54*  CREATININE 2.33*  --  2.48*  --  2.70* 3.04* 3.18*  CALCIUM 8.1*   < > 8.3*   < > 8.2*  8.3* 8.5* 8.6*  MG 1.5*  --   --   --   --   --   --   PHOS  --   --   --   --  4.9*  --   --    < > = values in this interval not displayed.    Liver Function Tests: No results for input(s): AST, ALT, ALKPHOS, BILITOT, PROT, ALBUMIN in the last 168 hours. No results for input(s): LIPASE, AMYLASE in the last 168 hours. No results for  input(s): AMMONIA in the last 168 hours.  CBC: Recent Labs  Lab 03/17/20 0531 03/18/20 0101 03/19/20 0557 03/20/20 0438 03/21/20 0551  WBC 10.8* 12.6* 14.6* 12.4* 11.1*  HGB 9.0* 8.6* 8.4* 8.3* 8.3*  HCT 26.4* 24.9* 24.3* 23.8* 24.5*  MCV 83.8 83.3 82.9 82.9 83.3  PLT 307 301 307 335 354    Cardiac Enzymes: No results for input(s): CKTOTAL, CKMB, CKMBINDEX, TROPONINI in the last 168 hours.  BNP: Invalid input(s): POCBNP  CBG: No results for input(s): GLUCAP in the last 168 hours.  Microbiology: Results for orders placed or performed during the hospital encounter of 03/16/20  Urine Culture     Status: Abnormal   Collection Time: 03/16/20  5:49 PM   Specimen: Urine, Random  Result Value Ref Range Status   Specimen Description   Final    URINE, RANDOM Performed at [facility], [address] [place], [place] [address]    Special Requests   Final    NONE Performed at [facility], [place], [place] [address]    Culture (A)  Final    <10,000 COLONIES/mL INSIGNIFICANT GROWTH Performed at [facility] [address] [place], [place] [address]    Report Status 03/17/2020 FINAL  Final  SARS CORONAVIRUS 2 (TAT 6-24 HRS) Nasopharyngeal Nasopharyngeal Swab     Status: None   Collection Time: 03/16/20  6:00 PM   Specimen: Nasopharyngeal Swab  Result Value Ref Range Status   SARS Coronavirus 2 NEGATIVE NEGATIVE Final    Comment: (NOTE) SARS-CoV-2 target nucleic acids are NOT DETECTED. The SARS-CoV-2 RNA is generally detectable in upper and lower respiratory specimens during the acute phase of infection. Negative results do not preclude SARS-CoV-2 infection, do not rule out co-infections with other pathogens, and should not be used as the sole basis for treatment or other patient management decisions. Negative results must be combined with clinical observations, patient history, and epidemiological information. The  expected result is Negative. Fact Sheet for Patients: SugarRoll.be Fact Sheet for Healthcare Providers: https://www.woods-mathews.com/ This test is not yet approved or cleared by the [place] FDA and  has been authorized for detection and/or diagnosis of SARS-CoV-2 by FDA under an Emergency Use Authorization (EUA). This EUA will remain  in effect (meaning this test can be used) for the duration of the COVID-19 declaration under Section 56 4(b)(1) of the Act, 21 U.S.C. section 360bbb-3(b)(1), unless the authorization is terminated or revoked sooner. Performed at [facility], [place] [address], [place], [place] [address]     Coagulation Studies: No results for input(s): LABPROT, INR in the last 72 hours.  Urinalysis: Recent Labs    03/19/20 1148  COLORURINE YELLOW*  LABSPEC 1.011  PHURINE 5.0  GLUCOSEU NEGATIVE  HGBUR LARGE*  BILIRUBINUR NEGATIVE  KETONESUR NEGATIVE  PROTEINUR 100*  NITRITE NEGATIVE  LEUKOCYTESUR MODERATE*      Imaging: No results found.   Medications:   . ferumoxytol 510 mg (03/19/20 1159)   . atenolol  50 mg Oral Daily  . bisacodyl  5 mg Oral Daily  . clonazePAM  0.25 mg Oral BID  . cloNIDine  0.1 mg Oral TID  . docusate sodium  200 mg Oral BID  . furosemide  40 mg Intravenous Daily  . heparin injection (subcutaneous)  5,000 Units Subcutaneous Q8H  . hydrALAZINE  50 mg Oral Q8H  . scopolamine  1 patch Transdermal Q72H  . sodium chloride flush  3 mL Intravenous Once  . sodium chloride flush  3 mL Intravenous Q12H   acetaminophen **OR** acetaminophen, hydrALAZINE, magnesium hydroxide, ondansetron (ZOFRAN) IV, ondansetron, traMADol  Assessment/ Plan:  79 y.o. female with past medical history of hypertension, aortic stenosis, hyperlipidemia, chronic kidney disease stage III a baseline creatinine 1.1 who presented to [facility] with hypertensive urgency, pulmonary edema,  shortness of breath and found to have worsening acute kidney injury.  1.  Acute kidney injury/chronic kidney disease stage IIIa.  Baseline creatinine 1.1.  Now appears to have proteinuria, hematuria and rising creatinine.  Glomerulonephritis is certainly on the differential.  She has a very elevated ANA with 1: 1280 titer.   -Still awaiting additional serologic work-up at this time.  Aspirin now held.  We will plan for renal biopsy on Thursday.  No urgent indication for dialysis at the moment however EGFR is quite low at 13.  2.  Anemia of chronic kidney disease.  Hemoglobin 8.3 at last check.  Hold off on Epogen for now.  3.  Hyponatremia.  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  Discontinue Lasix for now as potassium also now low.  4.  Hypertension.  Atenolol added to medication regimen.  Otherwise maintain the patient on clonidine and hydralazine as well.   LOS: 5 Linda Bradley 3/30/20213:18 PM

## 2020-03-22 LAB — CBC WITH DIFFERENTIAL/PLATELET
Abs Immature Granulocytes: 0.06 10*3/uL (ref 0.00–0.07)
Basophils Absolute: 0.1 10*3/uL (ref 0.0–0.1)
Basophils Relative: 1 %
Eosinophils Absolute: 0.6 10*3/uL — ABNORMAL HIGH (ref 0.0–0.5)
Eosinophils Relative: 5 %
HCT: 27 % — ABNORMAL LOW (ref 36.0–46.0)
Hemoglobin: 9.2 g/dL — ABNORMAL LOW (ref 12.0–15.0)
Immature Granulocytes: 1 %
Lymphocytes Relative: 14 %
Lymphs Abs: 1.7 10*3/uL (ref 0.7–4.0)
MCH: 28.4 pg (ref 26.0–34.0)
MCHC: 34.1 g/dL (ref 30.0–36.0)
MCV: 83.3 fL (ref 80.0–100.0)
Monocytes Absolute: 1.5 10*3/uL — ABNORMAL HIGH (ref 0.1–1.0)
Monocytes Relative: 12 %
Neutro Abs: 8.5 10*3/uL — ABNORMAL HIGH (ref 1.7–7.7)
Neutrophils Relative %: 67 %
Platelets: 436 10*3/uL — ABNORMAL HIGH (ref 150–400)
RBC: 3.24 MIL/uL — ABNORMAL LOW (ref 3.87–5.11)
RDW: 12.5 % (ref 11.5–15.5)
WBC: 12.6 10*3/uL — ABNORMAL HIGH (ref 4.0–10.5)
nRBC: 0 % (ref 0.0–0.2)

## 2020-03-22 LAB — ANCA TITERS
Atypical P-ANCA titer: <1:20 {titer}
C-ANCA: <1:20 {titer}
P-ANCA: 1:640 {titer} — ABNORMAL HIGH

## 2020-03-22 LAB — PROTIME-INR
INR: 1.1 (ref 0.8–1.2)
Prothrombin Time: 13.8 seconds (ref 11.4–15.2)

## 2020-03-22 LAB — PROTEIN ELECTROPHORESIS, SERUM
A/G Ratio: 1 (ref 0.7–1.7)
Albumin ELP: 2.8 g/dL — ABNORMAL LOW (ref 2.9–4.4)
Alpha-1-Globulin: 0.4 g/dL (ref 0.0–0.4)
Alpha-2-Globulin: 0.8 g/dL (ref 0.4–1.0)
Beta Globulin: 0.8 g/dL (ref 0.7–1.3)
Gamma Globulin: 0.9 g/dL (ref 0.4–1.8)
Globulin, Total: 2.9 g/dL (ref 2.2–3.9)
Total Protein ELP: 5.7 g/dL — ABNORMAL LOW (ref 6.0–8.5)

## 2020-03-22 LAB — CBC
HCT: 24.9 % — ABNORMAL LOW (ref 36.0–46.0)
Hemoglobin: 8.7 g/dL — ABNORMAL LOW (ref 12.0–15.0)
MCH: 28.5 pg (ref 26.0–34.0)
MCHC: 34.9 g/dL (ref 30.0–36.0)
MCV: 81.6 fL (ref 80.0–100.0)
Platelets: 384 10*3/uL (ref 150–400)
RBC: 3.05 MIL/uL — ABNORMAL LOW (ref 3.87–5.11)
RDW: 12.7 % (ref 11.5–15.5)
WBC: 10.8 10*3/uL — ABNORMAL HIGH (ref 4.0–10.5)
nRBC: 0 % (ref 0.0–0.2)

## 2020-03-22 LAB — BASIC METABOLIC PANEL
Anion gap: 11 (ref 5–15)
BUN: 58 mg/dL — ABNORMAL HIGH (ref 8–23)
CO2: 27 mmol/L (ref 22–32)
Calcium: 8.6 mg/dL — ABNORMAL LOW (ref 8.9–10.3)
Chloride: 91 mmol/L — ABNORMAL LOW (ref 98–111)
Creatinine, Ser: 3.24 mg/dL — ABNORMAL HIGH (ref 0.44–1.00)
GFR calc Af Amer: 15 mL/min — ABNORMAL LOW (ref >60)
GFR calc non Af Amer: 13 mL/min — ABNORMAL LOW (ref >60)
Glucose, Bld: 106 mg/dL — ABNORMAL HIGH (ref 70–99)
Potassium: 3.5 mmol/L (ref 3.5–5.1)
Sodium: 129 mmol/L — ABNORMAL LOW (ref 135–145)

## 2020-03-22 LAB — IMMUNOFIXATION ELECTROPHORESIS
IgA: 72 mg/dL (ref 64–422)
IgG (Immunoglobin G), Serum: 583 mg/dL — ABNORMAL LOW (ref 586–1602)
IgM (Immunoglobulin M), Srm: 829 mg/dL — ABNORMAL HIGH (ref 26–217)
Total Protein ELP: 5.7 g/dL — ABNORMAL LOW (ref 6.0–8.5)

## 2020-03-22 LAB — TYPE AND SCREEN
ABO/RH(D): A POS
Antibody Screen: NEGATIVE

## 2020-03-22 LAB — ANA W/REFLEX IF POSITIVE: Anti Nuclear Antibody (ANA): NEGATIVE

## 2020-03-22 MED ORDER — HYDRALAZINE HCL 50 MG PO TABS
100.0000 mg | ORAL_TABLET | Freq: Three times a day (TID) | ORAL | Status: DC
  Administered 2020-03-22 – 2020-03-25 (×5): 100 mg via ORAL
  Filled 2020-03-22 (×8): qty 2

## 2020-03-22 MED ORDER — ATENOLOL 25 MG PO TABS
50.0000 mg | ORAL_TABLET | Freq: Two times a day (BID) | ORAL | Status: DC
  Administered 2020-03-22 – 2020-03-25 (×5): 50 mg via ORAL
  Filled 2020-03-22 (×5): qty 2

## 2020-03-22 NOTE — Progress Notes (Signed)
[facility]  ROUNDING NOTE   Subjective:  Further serologic work-up has come back now. 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 ANA was positive with high titer however double-stranded DNA was negative. Renal biopsy scheduled for tomorrow.   Objective:  Vital signs in last 24 hours:  Temp:  [98.2 F (36.8 C)-98.5 F (36.9 C)] 98.4 F (36.9 C) (03/31 0717) Pulse Rate:  [70-83] 75 (03/31 0717) Resp:  [18-20] 20 (03/31 0717) BP: (119-170)/(56-77) 156/65 (03/31 0717) SpO2:  [93 %-94 %] 94 % (03/31 0717)  Weight change:  Filed Weights   03/16/20 1520 03/16/20 1709  Weight: 68 kg 72 kg    Intake/Output: I/O last 3 completed shifts: In: 840 [P.O.:840] Out: -    Intake/Output this shift:  Total I/O In: 0  Out: 600 [Urine:600]  Physical Exam: General: No acute distress  Head: Normocephalic, atraumatic. Moist oral mucosal membranes  Eyes: Anicteric  Neck: Supple, trachea midline  Lungs:  Clear to auscultation, normal effort  Heart: S1S2 no rubs  Abdomen:  Soft, nontender, bowel sounds present  Extremities: Trace peripheral edema.  Neurologic: Awake, alert, following commands  Skin: Mild mallar rash on the face  Access: None    Basic Metabolic Panel: Recent Labs  Lab 03/17/20 0531 03/17/20 0531 03/18/20 0101 03/18/20 0101 03/19/20 0557 03/19/20 0557 03/20/20 0438 03/21/20 0551 03/22/20 0517  NA 132*   < > 128*  --  126*  --  126* 125* 129*  K 3.1*   < > 3.4*  --  3.0*  --  3.7 3.3* 3.5  CL 95*   < > 90*  --  89*  --  88* 86* 91*  CO2 26   < > 28  --  25  --  _0 GLUCOSE 99   < > 119*  --  105*  --  104* 95 106*  BUN 29*   < > 37*  --  41*  --  49* 54* 58*  CREATININE 2.33*   < > 2.48*  --  2.70*  --  3.04* 3.18* 3.24*  CALCIUM 8.1*   < > 8.3*   < > 8.2*  8.3*   < > 8.5* 8.6* 8.6*  MG 1.5*  --   --   --   --   --   --   --   --   PHOS  --   --   --   --  4.9*  --   --    --   --    < > = values in this interval not displayed.    Liver Function Tests: No results for input(s): AST, ALT, ALKPHOS, BILITOT, PROT, ALBUMIN in the last 168 hours. No results for input(s): LIPASE, AMYLASE in the last 168 hours. No results for input(s): AMMONIA in the last 168 hours.  CBC: Recent Labs  Lab 03/19/20 0557 03/20/20 0438 03/21/20 0551 03/22/20 0517 03/22/20 1147  WBC 14.6* 12.4* 11.1* 10.8* 12.6*  NEUTROABS  --   --   --   --  8.5*  HGB 8.4* 8.3* 8.3* 8.7* 9.2*  HCT 24.3* 23.8* 24.5* 24.9* 27.0*  MCV 82.9 82.9 83.3 81.6 83.3  PLT 307 335 354 384 436*    Cardiac Enzymes: No results for input(s): CKTOTAL, CKMB, CKMBINDEX, TROPONINI in the last 168 hours.  BNP: Invalid input(s): POCBNP  CBG: No results for input(s): GLUCAP in the last 168  hours.  Microbiology: Results for orders placed or performed during the hospital encounter of 03/16/20  Urine Culture     Status: Abnormal   Collection Time: 03/16/20  5:49 PM   Specimen: Urine, Random  Result Value Ref Range Status   Specimen Description   Final    URINE, RANDOM Performed at [facility], [address] [place], [place] [address]    Special Requests   Final    NONE Performed at [facility], [address] [place], [place] [address]    Culture (A)  Final    <10,000 COLONIES/mL INSIGNIFICANT GROWTH Performed at [facility], [place] [address], [place], [place] [address]    Report Status 03/17/2020 FINAL  Final  SARS CORONAVIRUS 2 (TAT 6-24 HRS) Nasopharyngeal Nasopharyngeal Swab     Status: None   Collection Time: 03/16/20  6:00 PM   Specimen: Nasopharyngeal Swab  Result Value Ref Range Status   SARS Coronavirus 2 NEGATIVE NEGATIVE Final    Comment: (NOTE) SARS-CoV-2 target nucleic acids are NOT DETECTED. The SARS-CoV-2 RNA is generally detectable in upper and lower respiratory specimens during the acute phase of infection. Negative results do not preclude  SARS-CoV-2 infection, do not rule out co-infections with other pathogens, and should not be used as the sole basis for treatment or other patient management decisions. Negative results must be combined with clinical observations, patient history, and epidemiological information. The expected result is Negative. Fact Sheet for Patients: SugarRoll.be Fact Sheet for Healthcare Providers: https://www.woods-mathews.com/ This test is not yet approved or cleared by the [place] FDA and  has been authorized for detection and/or diagnosis of SARS-CoV-2 by FDA under an Emergency Use Authorization (EUA). This EUA will remain  in effect (meaning this test can be used) for the duration of the COVID-19 declaration under Section 56 4(b)(1) of the Act, 21 U.S.C. section 360bbb-3(b)(1), unless the authorization is terminated or revoked sooner. Performed at [facility], [place] [address] [place],  [address]     Coagulation Studies: Recent Labs    03/22/20 1147  LABPROT 13.8  INR 1.1    Urinalysis: No results for input(s): COLORURINE, LABSPEC, PHURINE, GLUCOSEU, HGBUR, BILIRUBINUR, KETONESUR, PROTEINUR, UROBILINOGEN, NITRITE, LEUKOCYTESUR in the last 72 hours.  Invalid input(s): APPERANCEUR    Imaging: No results found.   Medications:   . ferumoxytol 510 mg (03/19/20 1159)   . atenolol  50 mg Oral BID  . bisacodyl  5 mg Oral Daily  . clonazePAM  0.25 mg Oral BID  . cloNIDine  0.1 mg Oral TID  . docusate sodium  200 mg Oral BID  . hydrALAZINE  50 mg Oral Q8H  . scopolamine  1 patch Transdermal Q72H  . sodium chloride flush  3 mL Intravenous Once  . sodium chloride flush  3 mL Intravenous Q12H   acetaminophen **OR** acetaminophen, hydrALAZINE, magnesium hydroxide, ondansetron (ZOFRAN) IV, ondansetron, traMADol  Assessment/ Plan:  79 y.o. female with past medical history of hypertension, aortic stenosis, hyperlipidemia,  chronic kidney disease stage III a baseline creatinine 1.1 who presented to [facility] with hypertensive urgency, pulmonary edema, shortness of breath and found to have worsening acute kidney injury.  1.  Acute kidney injury/chronic kidney disease stage IIIa.  Baseline creatinine 1.1.  Now appears to have proteinuria, hematuria and rising creatinine.  Glomerulonephritis is certainly on the differential.  She has a very elevated ANA with 1: 1280 titer.  DS DNA negative.  MPO antiboody positive. 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  Creatinine remains high at 3.24 with an EGFR of 13.  We are planning for renal biopsy tomorrow.  If she is found to have ANCA associated vasculitis we would recommend treatment with Solu-Medrol and rituximab.  Further plan to be determined once biopsy results are available but we do plan to start Solu-Medrol after the biopsy is performed tomorrow.  Heparin has been placed on hold and we have transitioned the patient to bilateral SCDs.  2.  Anemia of chronic kidney disease.  Hemoglobin up to 9.2.  Continue to monitor closely.  3.  Hyponatremia.  Sodium up to 129.  Continue to monitor.  4.  Hypertension.  Blood pressure has been labile.  Continue clonidine, hydralazine, atenolol.  We will increase hydralazine to 100 mg every 8 hours.   LOS: doctor with. 6 Travelle Mcclimans 3/31/20212:06 PM

## 2020-03-22 NOTE — Progress Notes (Signed)
Day RN's Ana and Bet informed that patient should be NPO for renal BX tomorrow. Appropriate lab orders have been placed per Dr. Holley Raring.   Vaughan Sine, RN  03/22/2020

## 2020-03-22 NOTE — Progress Notes (Signed)
PROGRESS NOTE    Linda Bradley  QVZ:[id number] DOB: [birth year] DOA: 03/16/2020 PCP: Lynnell Jude, MD       Assessment & Plan:   Active Problems:   Dyspnea   AKI (acute kidney injury) ([place])   Anemia of chronic disease  Dyspnea: possibly secondary to new acute diastolic CHF vs flash pulmonary edema. Much improved. Continue to hold lasix secondary to AKI or CKD. Monitor I/Os. Echo shows EF 32-95%, grade I diastolic dysfunction, & moderate to severe aortic stenosis. Cardio following and recs apprec  Acute hypoxic respiratory failure: secondary to above. Weaned off of supplemental oxygen. Resolved   HTN urgency: w/ recent hx of uncontrolled HTN. Will continue on clondine & increase dose of hydralazine. IV hydralazine prn. D/c coreg & restarted atenolol as per cardio. Will hold ACE-I secondary to AKI   AKI on CKDIIIb: Cr continues to rise daily. Possibly ATN vs GN. Nephro following and recs apprec.  Renal US shows increased echotexture of right kidney. Autoimmune work pending. ESR is elevated. ANA is elevated. Decreased total complement. Renal biopsy tomorrow as per nephro. No HCTZ as this previously discontinued. Will hold home dose of ACE-I secondary to AKI. Will continue to monitor   Likely anemia of chronic disease: etiology unclear, possibly secondary to CKD. No need for a transfusion at this time. Will continue on iron supplements as pt's iron is low. Will continue to monitor   Vitamin B12 deficiency: s/p IM B12 supplement  Hypokalemia: WNL today. 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 Will continue to monitor   Leukocytosis: reactive vs infection. Trending down. UA is positive for leukocytes, rare bacteria. Urine cx shows insufficient growth. No GU symptoms currently. 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 BMI 32.0, would benefit from weight loss   Elevated troponins: likely secondary to possible new onset CHF vs flash pulmonary  edema   Secondary hyperparathyroidism: management as per nephro.   Thrombocytosis: resolved  Anxiety: severity unknown. Continue on home dose of klonipin   Constipation: continue on colace, dulcolax   DVT prophylaxis: lovenox Code Status: full  Family Communication: discussed pt's care w/ pt's daughter, Clarise Cruz, and answered her questions  Disposition Plan: will likely d/c back home when medically stable; plan for renal biopsy on 03/23/20   Consultants:  Cardio nephro   Procedures:    Antimicrobials:    Subjective: Pt c/o fatigue but better than day prior.   Objective: Vitals:   03/21/20 1605 03/21/20 2237 03/22/20 0012 03/22/20 0627  BP: (!) 144/64 (!) 170/77 (!) 119/56 (!) 145/66  Pulse: 75 83 70 73  Resp:   18   Temp: 98.5 F (36.9 C)  98.2 F (36.8 C)   TempSrc: Oral  Oral   SpO2: 93%  94%   Weight:      Height:        Intake/Output Summary (Last 24 hours) at 03/22/2020 0710 Last data filed at 03/21/2020 1902 Gross per 24 hour  Intake 720 ml  Output --  Net 720 ml   Filed Weights   03/16/20 1520 03/16/20 1709  Weight: 68 kg 72 kg    Examination:  General exam: Appears calm and comfortable  Respiratory system: decreased breath sounds b/l/. No wheezes Cardiovascular system: S1 & S2 +. No  rubs, gallops or clicks.  Gastrointestinal system: Abdomen is nondistended, soft and nontender. Normal bowel sounds heard. Central nervous system: Alert and oriented. Moves all 4 extremities  Psychiatry: Judgement and  insight appear normal. Normal mood and affect    Data Reviewed: I have personally reviewed following labs and imaging studies  CBC: Recent Labs  Lab 03/18/20 0101 03/19/20 0557 03/20/20 0438 03/21/20 0551 03/22/20 0517  WBC 12.6* 14.6* 12.4* 11.1* 10.8*  HGB 8.6* 8.4* 8.3* 8.3* 8.7*  HCT 24.9* 24.3* 23.8* 24.5* 24.9*  MCV 83.3 82.9 82.9 83.3 81.6  PLT 301 307 335 354 800   Basic Metabolic Panel: Recent Labs  Lab 03/17/20 0531  03/17/20 0531 03/18/20 0101 03/19/20 0557 03/20/20 0438 03/21/20 0551 03/22/20 0517  NA 132*   < > 128* 126* 126* 125* 129*  K 3.1*   < > 3.4* 3.0* 3.7 3.3* 3.5  CL 95*   < > 90* 89* 88* 86* 91*  CO2 26   < > _0 GLUCOSE 99   < > 119* 105* 104* 95 106*  BUN 29*   < > 37* 41* 49* 54* 58*  CREATININE 2.33*   < > 2.48* 2.70* 3.04* 3.18* 3.24*  CALCIUM 8.1*   < > 8.3* 8.2*  8.3* 8.5* 8.6* 8.6*  MG 1.5*  --   --   --   --   --   --   PHOS  --   --   --  4.9*  --   --   --    < > = values in this interval not displayed.   GFR: Estimated Creatinine Clearance: 12.6 mL/min (A) (by C-G formula based on SCr of 3.24 mg/dL (H)). Liver Function Tests: No results for input(s): AST, ALT, ALKPHOS, BILITOT, PROT, ALBUMIN in the last 168 hours. No results for input(s): LIPASE, AMYLASE in the last 168 hours. No results for input(s): AMMONIA in the last 168 hours. Coagulation Profile: No results for input(s): INR, PROTIME in the last 168 hours. Cardiac Enzymes: No results for input(s): CKTOTAL, CKMB, CKMBINDEX, TROPONINI in the last 168 hours. BNP (last 3 results) No results for input(s): PROBNP in the last 8760 hours. HbA1C: No results for input(s): HGBA1C in the last 72 hours. CBG: No results for input(s): GLUCAP in the last 168 hours. Lipid Profile: No results for input(s): CHOL, HDL, LDLCALC, TRIG, CHOLHDL, LDLDIRECT in the last 72 hours. Thyroid Function Tests: No results for input(s): TSH, T4TOTAL, FREET4, T3FREE, THYROIDAB in the last 72 hours. Anemia Panel: No results for input(s): VITAMINB12, FOLATE, FERRITIN, TIBC, IRON, RETICCTPCT in the last 72 hours. Sepsis Labs: No results for input(s): PROCALCITON, LATICACIDVEN in the last 168 hours.  Recent Results (from the past 240 hour(s))  Urine Culture     Status: Abnormal   Collection Time: 03/16/20  5:49 PM   Specimen: Urine, Random  Result Value Ref Range Status   Specimen Description   Final    URINE,  RANDOM Performed at [facility], [address] [place], [place] [address]    Special Requests   Final    NONE Performed at [facility], [address] [place], [place] [address]    Culture (A)  Final    <10,000 COLONIES/mL INSIGNIFICANT GROWTH Performed at [facility] [address], [place], [place] [address]    Report Status 03/17/2020 FINAL  Final  SARS CORONAVIRUS 2 (TAT 6-24 HRS) Nasopharyngeal Nasopharyngeal Swab     Status: None   Collection Time: 03/16/20  6:00 PM   Specimen: Nasopharyngeal Swab  Result Value Ref Range Status   SARS Coronavirus 2 NEGATIVE NEGATIVE Final  Comment: (NOTE) SARS-CoV-2 target nucleic acids are NOT DETECTED. The SARS-CoV-2 RNA is generally detectable in upper and lower respiratory specimens during the acute phase of infection. Negative results do not preclude SARS-CoV-2 infection, do not rule out co-infections with other pathogens, and should not be used as the sole basis for treatment or other patient management decisions. Negative results must be combined with clinical observations, patient history, and epidemiological information. The expected result is Negative. Fact Sheet for Patients: SugarRoll.be Fact Sheet for Healthcare Providers: https://www.woods-mathews.com/ This test is not yet approved or cleared by the [place] FDA and  has been authorized for detection and/or diagnosis of SARS-CoV-2 by FDA under an Emergency Use Authorization (EUA). This EUA will remain  in effect (meaning this test can be used) for the duration of the COVID-19 declaration under Section 56 4(b)(1) of the Act, 21 U.S.C. section 360bbb-3(b)(1), unless the authorization is terminated or revoked sooner. Performed at [facility], [place] [address], [place], [place] [address]          Radiology Studies: No results found.      Scheduled Meds: . atenolol  50 mg Oral  Daily  . bisacodyl  5 mg Oral Daily  . clonazePAM  0.25 mg Oral BID  . cloNIDine  0.1 mg Oral TID  . docusate sodium  200 mg Oral BID  . heparin injection (subcutaneous)  5,000 Units Subcutaneous Q8H  . hydrALAZINE  50 mg Oral Q8H  . scopolamine  1 patch Transdermal Q72H  . sodium chloride flush  3 mL Intravenous Once  . sodium chloride flush  3 mL Intravenous Q12H   Continuous Infusions: . ferumoxytol 510 mg (03/19/20 1159)     LOS: 6 days    Time spent: 30 mins    Wyvonnia Dusky, MD [facility] Pager [phone]  If 7PM-7AM, please contact night-coverage [email or web address] 03/22/2020, 7:10 AM  '

## 2020-03-23 ENCOUNTER — Inpatient Hospital Stay: Payer: Medicare HMO

## 2020-03-23 LAB — CBC
HCT: 24.9 % — ABNORMAL LOW (ref 36.0–46.0)
Hemoglobin: 8.8 g/dL — ABNORMAL LOW (ref 12.0–15.0)
MCH: 29.1 pg (ref 26.0–34.0)
MCHC: 35.3 g/dL (ref 30.0–36.0)
MCV: 82.5 fL (ref 80.0–100.0)
Platelets: 377 10*3/uL (ref 150–400)
RBC: 3.02 MIL/uL — ABNORMAL LOW (ref 3.87–5.11)
RDW: 12.6 % (ref 11.5–15.5)
WBC: 11.5 10*3/uL — ABNORMAL HIGH (ref 4.0–10.5)
nRBC: 0 % (ref 0.0–0.2)

## 2020-03-23 LAB — BASIC METABOLIC PANEL
Anion gap: 12 (ref 5–15)
BUN: 54 mg/dL — ABNORMAL HIGH (ref 8–23)
CO2: 26 mmol/L (ref 22–32)
Calcium: 8.6 mg/dL — ABNORMAL LOW (ref 8.9–10.3)
Chloride: 90 mmol/L — ABNORMAL LOW (ref 98–111)
Creatinine, Ser: 2.91 mg/dL — ABNORMAL HIGH (ref 0.44–1.00)
GFR calc Af Amer: 17 mL/min — ABNORMAL LOW (ref >60)
GFR calc non Af Amer: 15 mL/min — ABNORMAL LOW (ref >60)
Glucose, Bld: 105 mg/dL — ABNORMAL HIGH (ref 70–99)
Potassium: 3.4 mmol/L — ABNORMAL LOW (ref 3.5–5.1)
Sodium: 128 mmol/L — ABNORMAL LOW (ref 135–145)

## 2020-03-23 MED ORDER — LABETALOL HCL 5 MG/ML IV SOLN
INTRAVENOUS | Status: AC
  Administered 2020-03-23: 10 mg
  Filled 2020-03-23: qty 4

## 2020-03-23 MED ORDER — MIDAZOLAM HCL 2 MG/2ML IJ SOLN
INTRAMUSCULAR | Status: AC | PRN
  Administered 2020-03-23: 1 mg via INTRAVENOUS

## 2020-03-23 MED ORDER — MIDAZOLAM HCL 5 MG/5ML IJ SOLN
INTRAMUSCULAR | Status: AC
  Filled 2020-03-23: qty 5

## 2020-03-23 MED ORDER — FENTANYL CITRATE (PF) 100 MCG/2ML IJ SOLN
INTRAMUSCULAR | Status: AC | PRN
  Administered 2020-03-23: 50 ug via INTRAVENOUS

## 2020-03-23 MED ORDER — SODIUM CHLORIDE 0.9 % IV SOLN
500.0000 mg | INTRAVENOUS | Status: AC
  Administered 2020-03-23 – 2020-03-25 (×3): 500 mg via INTRAVENOUS
  Filled 2020-03-23 (×4): qty 4

## 2020-03-23 MED ORDER — HEPARIN SODIUM (PORCINE) 5000 UNIT/ML IJ SOLN
5000.0000 [IU] | Freq: Three times a day (TID) | INTRAMUSCULAR | Status: DC
  Administered 2020-03-23 – 2020-03-28 (×14): 5000 [IU] via SUBCUTANEOUS
  Filled 2020-03-23 (×14): qty 1

## 2020-03-23 MED ORDER — LABETALOL HCL 5 MG/ML IV SOLN
10.0000 mg | Freq: Once | INTRAVENOUS | Status: DC
  Filled 2020-03-23 (×2): qty 4

## 2020-03-23 MED ORDER — LABETALOL HCL 5 MG/ML IV SOLN
INTRAVENOUS | Status: AC
  Filled 2020-03-23: qty 4

## 2020-03-23 MED ORDER — FENTANYL CITRATE (PF) 100 MCG/2ML IJ SOLN
INTRAMUSCULAR | Status: AC
  Filled 2020-03-23: qty 2

## 2020-03-23 NOTE — Procedures (Signed)
Interventional Radiology Procedure Note  Procedure: US guided left kidney biopsy, for medical renal disease.  Complications: None Recommendations:  - Ok to advance diet, per primary team - Do not submerge for 7 days - Routine care - at least 3 hrs bedrest, supine   Signed,  Dulcy Fanny. Earleen Newport, DO

## 2020-03-23 NOTE — Consult Note (Addendum)
ANTICOAGULATION CONSULT NOTE  Pharmacy Consult for Subcutaneous Heparin  Indication: VTE prophylaxis  Allergies  Allergen Reactions  . Amlodipine Swelling  . Aspirin   . Chlorthalidone Diarrhea  . Statins     Other reaction(s): Other (See Comments) Other Reaction: CNS DISORDER    Estimated Creatinine Clearance: 14 mL/min (A) (by C-G formula based on SCr of 2.91 mg/dL (H)).   Assessment: Pharmacy has been consulted in resuming SubQ Heparin in a patient who recently had a renal biopsy today. Per nursing, patient's biopsy is done but she is not back on the floor.   Per consult, the bleeding risk is standard. Therefore, can start prophylactic anticoagulation in 6 hours.     Plan:  1. Will start Heparin 5000 units Q8H at 1800. Will follow Scr and CBC every 3 days as per protocol.   Pharmacy to sign-off at this time.   Thank you for allowing pharmacy to be a part of this patient's care.  Rowland Lathe 03/23/2020,11:40 AM

## 2020-03-23 NOTE — Progress Notes (Signed)
Interventional Radiology Preprocedure Note  Patient is 79 yo female with AKI, admitted.   Patient on table in the [place] dept, ready for US guided biopsy with Dr. Holley Raring.  Intra-operative consult for VIR, given the poor [place] window.   Seems reasonable to proceed targeting left kidney.    Discussed with the patient and with Dr. Holley Raring, with consent signed.   Signed,  Dulcy Fanny. Earleen Newport, DO

## 2020-03-23 NOTE — Care Management Important Message (Signed)
Important Message  Patient Details  Name: Linda Bradley MRN: [id number] Date of Birth: 10-12-1941   Medicare Important Message Given:  Yes     Loann Quill 03/23/2020, 11:51 AM

## 2020-03-23 NOTE — Progress Notes (Signed)
Patient clinically stable post Renal biopsy, stable vitals throughout. Received Versed 1mg  along with Fentanyl 73mcg IV for procedure. Denies complaints at this time report given to Ardelle Lesches Rn post procedure with questions answered.

## 2020-03-23 NOTE — Progress Notes (Signed)
PROGRESS NOTE    Linda Bradley  XTG:[id number] DOB: 06-24-1941 DOA: 03/16/2020 PCP: Lynnell Jude, MD   HPI: 79 y/o F w/ PMH of HTN, anxiety who presents w/ shortness of breath x day of admission. Hx was obtained from pt and pt's daughter who is at bedside. The shortness of breath is at rest as well as with exertion. Of note, pt's cardiologist at [facility] recently changed some of her medications secondary to AKI. Pt was taken off of HCTZ, atenolol and pt was put on coreg approx 3 weeks ago. Since the pt's cardiac meds were changed, pt's BP has been poorly controlled. Pt denies any PMH of CHF or MI. Pt also c/o intermittent chills, dizziness and nausea. Pt denies any fevers, sweating, chest pain, vomiting, cough, abd pain, dysuria, urinary urgency, urinary frequency, diarrhea or constipation.  Pt was initially diuresed w/ IV lasix for likely acute diastolic CHF. Pt was initially requiring supplemental oxygen but this has since been weaned off. Furthermore, pt presented w/ AKI on CKDIIIb with Cr rising daily and as a result nephro was consulted.  Lasix has since been held. An autoimmune report was started which has showed  ESR is elevated, ANA is elevated, decreased total complement, & elevated MPO antibody. 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 Renal biopsy today.  IV steroids will be started after renal biopsy today as per nephro. Finally, pt has had uncontrolled HTN since her outside cardiologist changed her BP as pt's Cr was rising as an outpatient as well.    Assessment & Plan:   Active Problems:   Dyspnea   AKI (acute kidney injury) ([place])   Anemia of chronic disease  AKI on CKDIIIb: Cr is trending down slightly today. Possibly ATN vs GN. Nephro following and recs apprec.  Renal US shows increased echotexture of right kidney. Autoimmune work pending. ESR is elevated. ANA is elevated. Decreased total complement. S/p renal biopsy  03/23/20, path pending. No HCTZ as this previously discontinued. Will hold home dose of ACE-I secondary to AKI. Will continue to monitor   Dyspnea: possibly secondary to new acute diastolic CHF vs flash pulmonary edema. Much improved. Continue to hold lasix secondary to AKI or CKD. Monitor I/Os. Echo shows EF 27-03%, grade I diastolic dysfunction, & moderate to severe aortic stenosis. Cardio following and recs apprec  Acute hypoxic respiratory failure: secondary to above. Weaned off of supplemental oxygen. Resolved   HTN urgency: w/ recent hx of uncontrolled HTN. Will continue on clondine & increase dose of hydralazine. IV hydralazine prn. D/c coreg & restarted atenolol as per cardio. Will hold ACE-I secondary to AKI   Likely anemia of chronic disease: etiology unclear, possibly secondary to CKD. No need for a transfusion at this time. Will continue on iron supplements as pt's iron is low. Will continue to monitor   Vitamin B12 deficiency: s/p IM B12 supplement  Hypokalemia: will defer repleting to nephro secondary to AKI on CKD. 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 Will continue to monitor   Leukocytosis: reactive vs infection. Trending down. UA is positive for leukocytes, rare bacteria. Urine cx shows insufficient growth. No GU symptoms currently. 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 BMI 32.0, would benefit from weight loss   Elevated troponins: likely secondary to possible new onset CHF vs flash pulmonary edema   Secondary hyperparathyroidism: management as per nephro.   Thrombocytosis: resolved  Anxiety:  severity unknown. Continue on home dose of klonipin   Constipation: continue on colace, dulcolax. Resolved   DVT prophylaxis: lovenox Code Status: full  Family Communication: discussed pt's care w/ pt's daughter, Clarise Cruz, and answered her questions  Disposition Plan: will likely d/c back home when medically stable & cleared by nephro;   Renal biopsy on 03/23/20   Consultants:  Cardio nephro   Procedures:    Antimicrobials:    Subjective: Pt c/o malaise   Objective: Vitals:   03/22/20 1450 03/22/20 1607 03/22/20 2143 03/23/20 0512  BP: (!) 149/73 (!) 148/56 (!) 146/64 (!) 155/67  Pulse: 76 69 72 76  Resp: 18  19   Temp: 98.9 F (37.2 C)  98.3 F (36.8 C) 99.1 F (37.3 C)  TempSrc: Oral  Oral Oral  SpO2: 94%  96%   Weight:      Height:        Intake/Output Summary (Last 24 hours) at 03/23/2020 0721 Last data filed at 03/22/2020 1707 Gross per 24 hour  Intake 0 ml  Output 301 ml  Net -301 ml   Filed Weights   03/16/20 1520 03/16/20 1709  Weight: 68 kg 72 kg    Examination:  General exam: Appears calm and comfortable  Respiratory system: diminished breath sounds b/l/. No rhonchi Cardiovascular system: S1 & S2 +. No  rubs, gallops or clicks.  Gastrointestinal system: Abdomen is nondistended, soft and nontender. Normal bowel sounds heard. Central nervous system: Alert and oriented. Moves all 4 extremities  Psychiatry: Judgement and insight appear normal. Normal mood and affect    Data Reviewed: I have personally reviewed following labs and imaging studies  CBC: Recent Labs  Lab 03/20/20 0438 03/21/20 0551 03/22/20 0517 03/22/20 1147 03/23/20 0433  WBC 12.4* 11.1* 10.8* 12.6* 11.5*  NEUTROABS  --   --   --  8.5*  --   HGB 8.3* 8.3* 8.7* 9.2* 8.8*  HCT 23.8* 24.5* 24.9* 27.0* 24.9*  MCV 82.9 83.3 81.6 83.3 82.5  PLT 335 354 384 436* 696   Basic Metabolic Panel: Recent Labs  Lab 03/17/20 0531 03/18/20 0101 03/19/20 0557 03/20/20 0438 03/21/20 0551 03/22/20 0517 03/23/20 0433  NA 132*   < > 126* 126* 125* 129* 128*  K 3.1*   < > 3.0* 3.7 3.3* 3.5 3.4*  CL 95*   < > 89* 88* 86* 91* 90*  CO2 26   < > 25 25 26 27 26   GLUCOSE 99   < > 105* 104* 95 106* 105*  BUN 29*   < > 41* 49* 54* 58* 54*  CREATININE 2.33*   < > 2.70* 3.04* 3.18* 3.24* 2.91*  CALCIUM 8.1*   < > 8.2*  8.3*  8.5* 8.6* 8.6* 8.6*  MG 1.5*  --   --   --   --   --   --   PHOS  --   --  4.9*  --   --   --   --    < > = values in this interval not displayed.   GFR: Estimated Creatinine Clearance: 14 mL/min (A) (by C-G formula based on SCr of 2.91 mg/dL (H)). Liver Function Tests: No results for input(s): AST, ALT, ALKPHOS, BILITOT, PROT, ALBUMIN in the last 168 hours. No results for input(s): LIPASE, AMYLASE in the last 168 hours. No results for input(s): AMMONIA in the last 168 hours. Coagulation Profile: Recent Labs  Lab 03/22/20 1147  INR 1.1   Cardiac Enzymes: No results for  input(s): CKTOTAL, CKMB, CKMBINDEX, TROPONINI in the last 168 hours. BNP (last 3 results) No results for input(s): PROBNP in the last 8760 hours. HbA1C: No results for input(s): HGBA1C in the last 72 hours. CBG: No results for input(s): GLUCAP in the last 168 hours. Lipid Profile: No results for input(s): CHOL, HDL, LDLCALC, TRIG, CHOLHDL, LDLDIRECT in the last 72 hours. Thyroid Function Tests: No results for input(s): TSH, T4TOTAL, FREET4, T3FREE, THYROIDAB in the last 72 hours. Anemia Panel: No results for input(s): VITAMINB12, FOLATE, FERRITIN, TIBC, IRON, RETICCTPCT in the last 72 hours. Sepsis Labs: No results for input(s): PROCALCITON, LATICACIDVEN in the last 168 hours.  Recent Results (from the past 240 hour(s))  Urine Culture     Status: Abnormal   Collection Time: 03/16/20  5:49 PM   Specimen: Urine, Random  Result Value Ref Range Status   Specimen Description   Final    URINE, RANDOM Performed at [facility], [address] [place], [place] [address]    Special Requests   Final    NONE Performed at [facility], [address] [place], [place] [address]    Culture (A)  Final    <10,000 COLONIES/mL INSIGNIFICANT GROWTH Performed at [facility] [address] [place], [place] [address]    Report Status 03/17/2020 FINAL  Final  SARS CORONAVIRUS 2 (TAT 6-24 HRS)  Nasopharyngeal Nasopharyngeal Swab     Status: None   Collection Time: 03/16/20  6:00 PM   Specimen: Nasopharyngeal Swab  Result Value Ref Range Status   SARS Coronavirus 2 NEGATIVE NEGATIVE Final    Comment: (NOTE) SARS-CoV-2 target nucleic acids are NOT DETECTED. The SARS-CoV-2 RNA is generally detectable in upper and lower respiratory specimens during the acute phase of infection. Negative results do not preclude SARS-CoV-2 infection, do not rule out co-infections with other pathogens, and should not be used as the sole basis for treatment or other patient management decisions. Negative results must be combined with clinical observations, patient history, and epidemiological information. The expected result is Negative. Fact Sheet for Patients: SugarRoll.be Fact Sheet for Healthcare Providers: https://www.woods-mathews.com/ This test is not yet approved or cleared by the [place] FDA and  has been authorized for detection and/or diagnosis of SARS-CoV-2 by FDA under an Emergency Use Authorization (EUA). This EUA will remain  in effect (meaning this test can be used) for the duration of the COVID-19 declaration under Section 56 4(b)(1) of the Act, 21 U.S.C. section 360bbb-3(b)(1), unless the authorization is terminated or revoked sooner. Performed at [facility], [place] [address] [place], [place] [address]          Radiology Studies: No results found.      Scheduled Meds: . atenolol  50 mg Oral BID  . bisacodyl  5 mg Oral Daily  . clonazePAM  0.25 mg Oral BID  . cloNIDine  0.1 mg Oral TID  . docusate sodium  200 mg Oral BID  . hydrALAZINE  100 mg Oral Q8H  . scopolamine  1 patch Transdermal Q72H  . sodium chloride flush  3 mL Intravenous Once  . sodium chloride flush  3 mL Intravenous Q12H   Continuous Infusions: . ferumoxytol 510 mg (03/19/20 1159)     LOS: 7 days    Time spent: 30 mins    Wyvonnia Dusky, MD [facility] Pager [phone]  If 7PM-7AM, please contact night-coverage [email or web address] 03/23/2020, 7:21 AM  '

## 2020-03-23 NOTE — Progress Notes (Signed)
[facility]  ROUNDING NOTE   Subjective:  Patient underwent left renal biopsy today. Tolerated procedure well. Appreciate interventional radiology's assistance.   Objective:  Vital signs in last 24 hours:  Temp:  [97.6 F (36.4 C)-99.1 F (37.3 C)] 97.6 F (36.4 C) (04/01 1230) Pulse Rate:  [69-78] 75 (04/01 1230) Resp:  [12-22] 18 (04/01 1211) BP: (111-155)/(53-67) 145/60 (04/01 1230) SpO2:  [90 %-96 %] 92 % (04/01 1230)  Weight change:  Filed Weights   03/16/20 1520 03/16/20 1709  Weight: 68 kg 72 kg    Intake/Output: I/O last 3 completed shifts: In: 240 [P.O.:240] Out: 701 [Urine:700; Stool:1]   Intake/Output this shift:  No intake/output data recorded.  Physical Exam: General: No acute distress  Head: Normocephalic, atraumatic. Moist oral mucosal membranes  Eyes: Anicteric  Neck: Supple, trachea midline  Lungs:  Clear to auscultation, normal effort  Heart: S1S2 no rubs  Abdomen:  Soft, nontender, bowel sounds present  Extremities: Trace peripheral edema.  Neurologic: Awake, alert, following commands  Skin: Mild mallar rash on the face  Access: None    Basic Metabolic Panel: Recent Labs  Lab 03/17/20 0531 03/18/20 0101 03/19/20 0557 03/19/20 0557 03/20/20 0438 03/20/20 0438 03/21/20 0551 03/22/20 0517 03/23/20 0433  NA 132*   < > 126*  --  126*  --  125* 129* 128*  K 3.1*   < > 3.0*  --  3.7  --  3.3* 3.5 3.4*  CL 95*   < > 89*  --  88*  --  86* 91* 90*  CO2 26   < > 25  --  25  --  26 27 26   GLUCOSE 99   < > 105*  --  104*  --  95 106* 105*  BUN 29*   < > 41*  --  49*  --  54* 58* 54*  CREATININE 2.33*   < > 2.70*  --  3.04*  --  3.18* 3.24* 2.91*  CALCIUM 8.1*   < > 8.2*  8.3*   < > 8.5*   < > 8.6* 8.6* 8.6*  MG 1.5*  --   --   --   --   --   --   --   --   PHOS  --   --  4.9*  --   --   --   --   --   --    < > = values in this interval not displayed.    Liver Function Tests: No results for input(s): AST, ALT, ALKPHOS,  BILITOT, PROT, ALBUMIN in the last 168 hours. No results for input(s): LIPASE, AMYLASE in the last 168 hours. No results for input(s): AMMONIA in the last 168 hours.  CBC: Recent Labs  Lab 03/20/20 0438 03/21/20 0551 03/22/20 0517 03/22/20 1147 03/23/20 0433  WBC 12.4* 11.1* 10.8* 12.6* 11.5*  NEUTROABS  --   --   --  8.5*  --   HGB 8.3* 8.3* 8.7* 9.2* 8.8*  HCT 23.8* 24.5* 24.9* 27.0* 24.9*  MCV 82.9 83.3 81.6 83.3 82.5  PLT 335 354 384 436* 377    Cardiac Enzymes: No results for input(s): CKTOTAL, CKMB, CKMBINDEX, TROPONINI in the last 168 hours.  BNP: Invalid input(s): POCBNP  CBG: No results for input(s): GLUCAP in the last 168 hours.  Microbiology: Results for orders placed or performed during the hospital encounter of 03/16/20  Urine Culture     Status: Abnormal   Collection Time: 03/16/20  5:49 PM   Specimen: Urine, Random  Result Value Ref Range Status   Specimen Description   Final    URINE, RANDOM Performed at [facility], [address], [place], [place] [address]    Special Requests   Final    NONE Performed at [facility], [address] [place], [place] [address]    Culture (A)  Final    <10,000 COLONIES/mL INSIGNIFICANT GROWTH Performed at [facility] [address], [place], [place] [address]    Report Status 03/17/2020 FINAL  Final  SARS CORONAVIRUS 2 (TAT 6-24 HRS) Nasopharyngeal Nasopharyngeal Swab     Status: None   Collection Time: 03/16/20  6:00 PM   Specimen: Nasopharyngeal Swab  Result Value Ref Range Status   SARS Coronavirus 2 NEGATIVE NEGATIVE Final    Comment: (NOTE) SARS-CoV-2 target nucleic acids are NOT DETECTED. The SARS-CoV-2 RNA is generally detectable in upper and lower respiratory specimens during the acute phase of infection. Negative results do not preclude SARS-CoV-2 infection, do not rule out co-infections with other pathogens, and should not be used as the sole basis for treatment or  other patient management decisions. Negative results must be combined with clinical observations, patient history, and epidemiological information. The expected result is Negative. Fact Sheet for Patients: SugarRoll.be Fact Sheet for Healthcare Providers: https://www.woods-mathews.com/ This test is not yet approved or cleared by the [place] FDA and  has been authorized for detection and/or diagnosis of SARS-CoV-2 by FDA under an Emergency Use Authorization (EUA). This EUA will remain  in effect (meaning this test can be used) for the duration of the COVID-19 declaration under Section 56 4(b)(1) of the Act, 21 U.S.C. section 360bbb-3(b)(1), unless the authorization is terminated or revoked sooner. Performed at [facility], [place] [address], [place], [place] [address]     Coagulation Studies: Recent Labs    03/22/20 1147  LABPROT 13.8  INR 1.1    Urinalysis: No results for input(s): COLORURINE, LABSPEC, PHURINE, GLUCOSEU, HGBUR, BILIRUBINUR, KETONESUR, PROTEINUR, UROBILINOGEN, NITRITE, LEUKOCYTESUR in the last 72 hours.  Invalid input(s): APPERANCEUR    Imaging: US BIOPSY (KIDNEY)  Result Date: 03/23/2020 INDICATION: 79 year old female with a history of acute kidney injury EXAM: IMAGE GUIDED RENAL BIOPSY MEDICATIONS: None. ANESTHESIA/SEDATION: Moderate (conscious) sedation was employed during this procedure. A total of Versed 1.0 mg and Fentanyl 50 mcg was administered intravenously. Moderate Sedation Time: 11 minutes. The patient's level of consciousness and vital signs were monitored continuously by radiology nursing throughout the procedure under my direct supervision. FLUOROSCOPY TIME:  Ultrasound COMPLICATIONS: None PROCEDURE: Informed written consent was obtained from the patient after a thorough discussion of the procedural risks, benefits and alternatives. All questions were addressed. Maximal Sterile Barrier Technique was  utilized including caps, mask, sterile gowns, sterile gloves, sterile drape, hand hygiene and skin antiseptic. A timeout was performed prior to the initiation of the procedure. Patient was positioned prone position on the gantry table. Images were stored sent to PACs. Once the patient is prepped and draped in the usual sterile fashion, the skin and subcutaneous tissues overlying the left kidney were generously infiltrated 1% lidocaine for local anesthesia. Using ultrasound guidance, transverse approach, a 15 gauge guide needle was advanced into the lateral cortex of the left kidney. Once we confirmed location of the needle tip, 4 separate 16 gauge core biopsy were achieved. Two Gel-Foam pledgets were infused with a small amount of saline. The needle was removed. Final images were stored. The  patient tolerated the procedure well and remained hemodynamically stable throughout. No complications were encountered and no significant blood loss encountered. IMPRESSION: Status post ultrasound-guided medical kidney biopsy of the left kidney. Signed, Dulcy Fanny. Dellia Nims, RPVI [facility] Electronically Signed   By: Corrie Mckusick D.O.   On: 03/23/2020 11:37     Medications:   . ferumoxytol 510 mg (03/19/20 1159)  . methylPREDNISolone (SOLU-MEDROL) injection     . atenolol  50 mg Oral BID  . bisacodyl  5 mg Oral Daily  . clonazePAM  0.25 mg Oral BID  . cloNIDine  0.1 mg Oral TID  . docusate sodium  200 mg Oral BID  . fentaNYL      . heparin injection (subcutaneous)  5,000 Units Subcutaneous Q8H  . hydrALAZINE  100 mg Oral Q8H  . labetalol      . labetalol  10 mg Intravenous Once  . midazolam      . scopolamine  1 patch Transdermal Q72H  . sodium chloride flush  3 mL Intravenous Once  . sodium chloride flush  3 mL Intravenous Q12H   acetaminophen **OR** acetaminophen, hydrALAZINE, magnesium hydroxide, ondansetron (ZOFRAN) IV, ondansetron,  traMADol  Assessment/ Plan:  79 y.o. female with past medical history of hypertension, aortic stenosis, hyperlipidemia, chronic kidney disease stage III a baseline creatinine 1.1 who presented to [facility] with hypertensive urgency, pulmonary edema, shortness of breath and found to have worsening acute kidney injury.  1.  Acute kidney injury/chronic kidney disease stage IIIa.  Baseline creatinine 1.1.  Now appears to have proteinuria, hematuria and rising creatinine.  Glomerulonephritis is certainly on the differential.  She has a very elevated ANA with 1: 1280 titer.  DS DNA negative.  MPO antiboody positive.  Renal biopsy performed 03/23/2020. 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  We will go ahead and start the patient on Solu-Medrol 500 mg IV daily x3 days since renal biopsy is completed.  We will consider treatment with rituximab once biopsy results available.  2.  Anemia of chronic kidney disease.  Hemoglobin currently 8.8.  We may need to consider use of Epogen.  Hold off for right now.  3.  Hyponatremia.  Serum sodium currently 128.  Likely due to SIADH and poor water excretion given underlying renal insufficiency.  4.  Hypertension.  Continue atenolol, hydralazine, clonidine   LOS: doctor with. 7 Kydan Shanholtzer 4/1/20213:27 PM

## 2020-03-23 NOTE — Progress Notes (Signed)
Physical Therapy Treatment Patient Details Name: Linda Bradley MRN: [id number] DOB: 12/14/1941 Today's Date: 03/23/2020    History of Present Illness Per MD note: RENEE ERB is a 79 y.o. female with a history of anxiety, aortic stenosis, hypertension who comes the ED complaining of shortness of breath.  She has been feeling sick for the past 2 weeks with fatigue, decreased appetite, decreased oral intake.  MD assessment: Dyspnea, Acute hypoxic respiratory failure, HTN urgency, AKI, hypokalemia, hyponatremia, leukocytosis, elevated troponins likely secondary to new CHF vs flash pulmonary edema, and thrombocytosis. Of note: COVID in January. Pt s/p kidney biospy this date    PT Comments    Pt is making limited progress towards goals secondary to fatigue this date. Pt takes increased encouragement to participate reporting she is connected to purewick and SCDs. Educated on disconnecting devices for mobility purposes. Agreeable to limited there-ex and ambulation. Encouraged to continue ambulation efforts with RN staff when rested. Will continue to progress.   Follow Up Recommendations  No PT follow up     Equipment Recommendations  None recommended by PT    Recommendations for Other Services       Precautions / Restrictions Precautions Precautions: Fall Restrictions Weight Bearing Restrictions: No    Mobility  Bed Mobility Overal bed mobility: Modified Independent             General bed mobility comments: safe technique performed with ease of mobility  Transfers Overall transfer level: Needs assistance Equipment used: Rolling walker (2 wheeled) Transfers: Sit to/from Stand Sit to Stand: Supervision         General transfer comment: assisted with donning shoes prior to transfer. Upright posture noted. RW used due to sleepiness  Ambulation/Gait Ambulation/Gait assistance: Supervision Gait Distance (Feet): 40 Feet Assistive device: Rolling walker (2 wheeled) Gait  Pattern/deviations: Step-through pattern     General Gait Details: ambulated using reciprocal gait pattern to bathroom and back. Further distance deferred secondary to fatigue   Stairs             Wheelchair Mobility    Modified Rankin (Stroke Patients Only)       Balance Overall balance assessment: Needs assistance Sitting-balance support: Bilateral upper extremity supported Sitting balance-Leahy Scale: Good     Standing balance support: Bilateral upper extremity supported Standing balance-Leahy Scale: Good                              Cognition Arousal/Alertness: Awake/alert Behavior During Therapy: WFL for tasks assessed/performed Overall Cognitive Status: Within Functional Limits for tasks assessed                                        Exercises Other Exercises Other Exercises: supine ther-ex performed on B LE including SLRs, heel slides, and quad sets. All ther-ex performed x 10 reps with supervision Other Exercises: Pt ambulated to bathroom with supervision and able to perform transfers on/off toilet safely.    General Comments        Pertinent Vitals/Pain Pain Assessment: No/denies pain    Home Living                      Prior Function            PT Goals (current goals can now be found in the care plan section) Acute  Rehab PT Goals Patient Stated Goal: to get my breathing under control and go home PT Goal Formulation: With patient Time For Goal Achievement: 03/30/20 Potential to Achieve Goals: Good Progress towards PT goals: Progressing toward goals    Frequency    Min 2X/week      PT Plan Current plan remains appropriate    Co-evaluation              AM-PAC PT "6 Clicks" Mobility   Outcome Measure  Help needed turning from your back to your side while in a flat bed without using bedrails?: None Help needed moving from lying on your back to sitting on the side of a flat bed without  using bedrails?: None Help needed moving to and from a bed to a chair (including a wheelchair)?: A Little Help needed standing up from a chair using your arms (e.g., wheelchair or bedside chair)?: A Little Help needed to walk in hospital room?: A Little Help needed climbing 3-5 steps with a railing? : A Little 6 Click Score: 20    End of Session Equipment Utilized During Treatment: Gait belt Activity Tolerance: Patient limited by fatigue Patient left: in bed;with family/visitor present;with nursing/sitter in room;with call bell/phone within reach Nurse Communication: Mobility status PT Visit Diagnosis: Muscle weakness (generalized) (M62.81);Unsteadiness on feet (R26.81)     Time: 0321-2248 PT Time Calculation (min) (ACUTE ONLY): 28 min  Charges:  $Therapeutic Exercise: 8-22 mins $Therapeutic Activity: 8-22 mins                     Greggory Stallion, PT, DPT [phone]    Devon Kingdon 03/23/2020, 5:44 PM

## 2020-03-24 DIAGNOSIS — I5032 Chronic diastolic (congestive) heart failure: Secondary | ICD-10-CM

## 2020-03-24 LAB — CBC
HCT: 25.3 % — ABNORMAL LOW (ref 36.0–46.0)
Hemoglobin: 8.5 g/dL — ABNORMAL LOW (ref 12.0–15.0)
MCH: 28.5 pg (ref 26.0–34.0)
MCHC: 33.6 g/dL (ref 30.0–36.0)
MCV: 84.9 fL (ref 80.0–100.0)
Platelets: 341 10*3/uL (ref 150–400)
RBC: 2.98 MIL/uL — ABNORMAL LOW (ref 3.87–5.11)
RDW: 12.9 % (ref 11.5–15.5)
WBC: 8.6 10*3/uL (ref 4.0–10.5)
nRBC: 0 % (ref 0.0–0.2)

## 2020-03-24 LAB — NA AND K (SODIUM & POTASSIUM), RAND UR
Potassium Urine: 28 mmol/L
Sodium, Ur: <10 mmol/L

## 2020-03-24 LAB — URINALYSIS, COMPLETE (UACMP) WITH MICROSCOPIC
Bilirubin Urine: NEGATIVE
Glucose, UA: NEGATIVE mg/dL
Ketones, ur: NEGATIVE mg/dL
Leukocytes,Ua: NEGATIVE
Nitrite: NEGATIVE
Protein, ur: 30 mg/dL — AB
RBC / HPF: >50 RBC/hpf — ABNORMAL HIGH (ref 0–5)
Specific Gravity, Urine: 1.01 (ref 1.005–1.030)
pH: 5 (ref 5.0–8.0)

## 2020-03-24 LAB — BASIC METABOLIC PANEL
Anion gap: 13 (ref 5–15)
BUN: 57 mg/dL — ABNORMAL HIGH (ref 8–23)
CO2: 24 mmol/L (ref 22–32)
Calcium: 8.7 mg/dL — ABNORMAL LOW (ref 8.9–10.3)
Chloride: 92 mmol/L — ABNORMAL LOW (ref 98–111)
Creatinine, Ser: 3.09 mg/dL — ABNORMAL HIGH (ref 0.44–1.00)
GFR calc Af Amer: 16 mL/min — ABNORMAL LOW (ref >60)
GFR calc non Af Amer: 14 mL/min — ABNORMAL LOW (ref >60)
Glucose, Bld: 190 mg/dL — ABNORMAL HIGH (ref 70–99)
Potassium: 3.6 mmol/L (ref 3.5–5.1)
Sodium: 129 mmol/L — ABNORMAL LOW (ref 135–145)

## 2020-03-24 NOTE — Progress Notes (Signed)
PROGRESS NOTE    Linda Bradley  EYC:[id number] DOB: 01/06/1941 DOA: 03/16/2020 PCP: Lynnell Jude, MD   HPI: 79 y/o F w/ PMH of HTN, anxiety who presents w/ shortness of breath x day of admission. Hx was obtained from pt and pt's daughter who is at bedside. The shortness of breath is at rest as well as with exertion. Of note, pt's cardiologist at [facility] recently changed some of her medications secondary to AKI. Pt was taken off of HCTZ, atenolol and pt was put on coreg approx 3 weeks ago. Since the pt's cardiac meds were changed, pt's BP has been poorly controlled. Pt denies any PMH of CHF or MI. Pt also c/o intermittent chills, dizziness and nausea. Pt denies any fevers, sweating, chest pain, vomiting, cough, abd pain, dysuria, urinary urgency, urinary frequency, diarrhea or constipation.  Pt was initially diuresed w/ IV lasix for likely acute diastolic CHF. Pt was initially requiring supplemental oxygen but this has since been weaned off. Furthermore, pt presented w/ AKI on CKDIIIb with Cr rising daily and as a result nephro was consulted.  Lasix has since been held. An autoimmune report was started which has showed  ESR is elevated, ANA is elevated, decreased total complement, & elevated MPO antibody. 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 Renal biopsy today.  IV steroids will be started after renal biopsy today as per nephro. Finally, pt has had uncontrolled HTN since her outside cardiologist changed her BP as pt's Cr was rising as an outpatient as well.    4/2: Patient seen and examined.  Patient son is at bedside.  Creatinine continues to track up.  3.09 today.  Patient endorses a feeling of polyuria.  Status post renal biopsy with interventional radiology.  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  Patient on high-dose IV steroid burst per nephrology.   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 Patient status post renal biopsy Started on steroids by nephrology Plan: Continue Solu-Medrol 500 mg daily x3 days, currently day 2 Follow pathology from renal biopsy Hydrochlorothiazide Holding home ACE inhibitor Daily renal function Avoid nephrotoxins Follow nephrology recommendations  Dyspnea  possibly secondary to new acute diastolic CHF vs flash pulmonary edema Much improved Currently on room air Speaking in complete sentences without increased work of breathing Echo shows EF 14-97%, grade I diastolic dysfunction, & moderate to severe aortic stenosis Cardiology was following, signed off Plan: Continue to hold lasix secondary to AKI or CKD.  Monitor I/Os.   Acute hypoxic respiratory failure, resolved secondary to above. Weaned off of supplemental oxygen  HTN urgency  w/ recent hx of uncontrolled HTN.  Well controlled overnight with Continue clonidine Continue hydralazine IV hydralazine as needed Continue atenolol ACE inhibitor on hold   Likely anemia of chronic disease  etiology unclear, possibly secondary to CKD.  No need for a transfusion at this time.  Will continue on iron supplements as pt's iron is low.  Monitoring DC, transfuse as needed  Vitamin B12 deficiency:  s/p IM B12 supplement  Hypokalemia:  will defer repleting to nephro secondary to AKI on CKD.    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  Will continue to monitor    DVT prophylaxis: lovenox Code Status: full  Family Communication:  Patient seen at bedside.  All questions answered disposition Plan: Anticipate return to previous home environment.  At this time patient is not medically stable.  Current barriers to discharge include acute kidney injury on chronic kidney disease.  Patient status post renal biopsy.  On high-dose  steroids.  Will need definitive diagnosis and stabilization of creatinine prior to consideration of discharge.   Consultants:  Cardio nephro   Procedures:    Antimicrobials:    Subjective: Patient seen and examined Feels well, no complaints Son at bedside  Objective: Vitals:   03/23/20 1542 03/23/20 2008 03/24/20 0513 03/24/20 1033  BP: (!) 141/51 (!) 141/61 (!) 146/73 (!) 150/58  Pulse: 74 81 72 80  Resp:   17   Temp: 99.1 F (37.3 C) 98 F (36.7 C)    TempSrc: Oral     SpO2: 95% 94% 95% 98%  Weight:      Height:        Intake/Output Summary (Last 24 hours) at 03/24/2020 1210 Last data filed at 03/24/2020 0534 Gross per 24 hour  Intake 240 ml  Output 201 ml  Net 39 ml   Filed Weights   03/16/20 1520 03/16/20 1709  Weight: 68 kg 72 kg    Examination:  General exam: Appears calm and comfortable  Respiratory system: diminished breath sounds b/l/. No rhonchi Cardiovascular system: S1 & S2 +. No  rubs, gallops or clicks.  Gastrointestinal system: Abdomen is nondistended, soft and nontender. Normal bowel sounds heard. Central nervous system: Alert and oriented. Moves all 4 extremities  Psychiatry: Judgement and insight appear normal. Normal mood and affect    Data Reviewed: I have personally reviewed following labs and imaging studies  CBC: Recent Labs  Lab 03/21/20 0551 03/22/20 0517 03/22/20 1147 03/23/20 0433 03/24/20 0439  WBC 11.1* 10.8* 12.6* 11.5* 8.6  NEUTROABS  --   --  8.5*  --   --   HGB 8.3* 8.7* 9.2* 8.8* 8.5*  HCT 24.5* 24.9* 27.0* 24.9* 25.3*  MCV 83.3 81.6 83.3 82.5 84.9  PLT 354 384 436* 377 622   Basic Metabolic Panel: Recent Labs  Lab 03/19/20 0557 03/19/20 0557 03/20/20 0438 03/21/20 0551 03/22/20 0517 03/23/20 0433 03/24/20 0439  NA 126*   < > 126* 125* 129* 128* 129*  K 3.0*   < > 3.7 3.3* 3.5 3.4* 3.6  CL 89*   < > 88* 86* 91* 90* 92*  CO2 25   < > 25 26 27 26 24   GLUCOSE 105*   < > 104* 95 106* 105* 190*  BUN 41*    < > 49* 54* 58* 54* 57*  CREATININE 2.70*   < > 3.04* 3.18* 3.24* 2.91* 3.09*  CALCIUM 8.2*  8.3*   < > 8.5* 8.6* 8.6* 8.6* 8.7*  PHOS 4.9*  --   --   --   --   --   --    < > = values in this interval not displayed.   GFR: Estimated Creatinine Clearance: 13.2 mL/min (A) (by C-G formula based on SCr of 3.09 mg/dL (H)). Liver Function Tests: No results for input(s): AST, ALT, ALKPHOS, BILITOT, PROT, ALBUMIN in the last 168 hours. No results for input(s): LIPASE, AMYLASE in the last 168 hours. No results for input(s): AMMONIA in the last 168 hours. Coagulation Profile: Recent Labs  Lab 03/22/20 1147  INR 1.1   Cardiac Enzymes: No results for input(s): CKTOTAL, CKMB, CKMBINDEX, TROPONINI in the last 168 hours. BNP (last 3  results) No results for input(s): PROBNP in the last 8760 hours. HbA1C: No results for input(s): HGBA1C in the last 72 hours. CBG: No results for input(s): GLUCAP in the last 168 hours. Lipid Profile: No results for input(s): CHOL, HDL, LDLCALC, TRIG, CHOLHDL, LDLDIRECT in the last 72 hours. Thyroid Function Tests: No results for input(s): TSH, T4TOTAL, FREET4, T3FREE, THYROIDAB in the last 72 hours. Anemia Panel: No results for input(s): VITAMINB12, FOLATE, FERRITIN, TIBC, IRON, RETICCTPCT in the last 72 hours. Sepsis Labs: No results for input(s): PROCALCITON, LATICACIDVEN in the last 168 hours.  Recent Results (from the past 240 hour(s))  Urine Culture     Status: Abnormal   Collection Time: 03/16/20  5:49 PM   Specimen: Urine, Random  Result Value Ref Range Status   Specimen Description   Final    URINE, RANDOM Performed at [facility], [address] [place], [place] [address]    Special Requests   Final    NONE Performed at [facility], [address] [place], [place] [address]    Culture (A)  Final    <10,000 COLONIES/mL INSIGNIFICANT GROWTH Performed at [facility] [address], [place], [place] [address]     Report Status 03/17/2020 FINAL  Final  SARS CORONAVIRUS 2 (TAT 6-24 HRS) Nasopharyngeal Nasopharyngeal Swab     Status: None   Collection Time: 03/16/20  6:00 PM   Specimen: Nasopharyngeal Swab  Result Value Ref Range Status   SARS Coronavirus 2 NEGATIVE NEGATIVE Final    Comment: (NOTE) SARS-CoV-2 target nucleic acids are NOT DETECTED. The SARS-CoV-2 RNA is generally detectable in upper and lower respiratory specimens during the acute phase of infection. Negative results do not preclude SARS-CoV-2 infection, do not rule out co-infections with other pathogens, and should not be used as the sole basis for treatment or other patient management decisions. Negative results must be combined with clinical observations, patient history, and epidemiological information. The expected result is Negative. Fact Sheet for Patients: SugarRoll.be Fact Sheet for Healthcare Providers: https://www.woods-mathews.com/ This test is not yet approved or cleared by the [place] FDA and  has been authorized for detection and/or diagnosis of SARS-CoV-2 by FDA under an Emergency Use Authorization (EUA). This EUA will remain  in effect (meaning this test can be used) for the duration of the COVID-19 declaration under Section 56 4(b)(1) of the Act, 21 U.S.C. section 360bbb-3(b)(1), unless the authorization is terminated or revoked sooner. Performed at [facility], [place] [address], [place], [place] [address]          Radiology Studies: US BIOPSY (KIDNEY)  Result Date: 03/23/2020 INDICATION: 79 year old female with a history of acute kidney injury EXAM: IMAGE GUIDED RENAL BIOPSY MEDICATIONS: None. ANESTHESIA/SEDATION: Moderate (conscious) sedation was employed during this procedure. A total of Versed 1.0 mg and Fentanyl 50 mcg was administered intravenously. Moderate Sedation Time: 11 minutes. The patient's level of consciousness and vital signs were  monitored continuously by radiology nursing throughout the procedure under my direct supervision. FLUOROSCOPY TIME:  Ultrasound COMPLICATIONS: None PROCEDURE: Informed written consent was obtained from the patient after a thorough discussion of the procedural risks, benefits and alternatives. All questions were addressed. Maximal Sterile Barrier Technique was utilized including caps, mask, sterile gowns, sterile gloves, sterile drape, hand hygiene and skin antiseptic. A timeout was performed prior to the initiation of the procedure. Patient was positioned prone position on the gantry table. Images were stored sent to PACs. Once the patient is prepped  and draped in the usual sterile fashion, the skin and subcutaneous tissues overlying the left kidney were generously infiltrated 1% lidocaine for local anesthesia. Using ultrasound guidance, transverse approach, a 15 gauge guide needle was advanced into the lateral cortex of the left kidney. Once we confirmed location of the needle tip, 4 separate 16 gauge core biopsy were achieved. Two Gel-Foam pledgets were infused with a small amount of saline. The needle was removed. Final images were stored. The patient tolerated the procedure well and remained hemodynamically stable throughout. No complications were encountered and no significant blood loss encountered. IMPRESSION: Status post ultrasound-guided medical kidney biopsy of the left kidney. Signed, Dulcy Fanny. Dellia Nims, RPVI [facility] Radiology Electronically Signed   By: Corrie Mckusick D.O.   On: 03/23/2020 11:37        Scheduled Meds: . atenolol  50 mg Oral BID  . bisacodyl  5 mg Oral Daily  . clonazePAM  0.25 mg Oral BID  . cloNIDine  0.1 mg Oral TID  . docusate sodium  200 mg Oral BID  . heparin injection (subcutaneous)  5,000 Units Subcutaneous Q8H  . hydrALAZINE  100 mg Oral Q8H  . labetalol  10 mg Intravenous Once  . scopolamine  1 patch  Transdermal Q72H  . sodium chloride flush  3 mL Intravenous Once  . sodium chloride flush  3 mL Intravenous Q12H   Continuous Infusions: . ferumoxytol 510 mg (03/19/20 1159)  . methylPREDNISolone (SOLU-MEDROL) injection 500 mg (03/23/20 2101)     LOS: 8 days    Time spent: 30 mins    Sidney Ace, MD [facility] Pager [phone]  If 7PM-7AM, please contact night-coverage [email or web address] 03/24/2020, 12:10 PM  '

## 2020-03-24 NOTE — Progress Notes (Signed)
Physical Therapy Treatment Patient Details Name: Linda Bradley MRN: [id number] DOB: 1941-01-04 Today's Date: 03/24/2020    History of Present Illness Per MD note: ARTHEA NOBEL is a 79 y.o. female with a history of anxiety, aortic stenosis, hypertension who comes the ED complaining of shortness of breath.  She has been feeling sick for the past 2 weeks with fatigue, decreased appetite, decreased oral intake.  MD assessment: Dyspnea, Acute hypoxic respiratory failure, HTN urgency, AKI, hypokalemia, hyponatremia, leukocytosis, elevated troponins likely secondary to new CHF vs flash pulmonary edema, and thrombocytosis. Of note: COVID in January. Pt s/p kidney biospy this date    PT Comments    Pt in bed.  Generally anxious and unsettled.  Initially refusing therapy but stating she needs to use bathroom and her bed is wet.  Offered assistance and she agreed.  OOB without assist.  She is able to walk to/from bathroom and attend to her own bathing and self care with set up assist.  She is encouraged but refused further gait stating she is "too weak" but anxiety over her stated "poor health" seemed to be more of a limiting factor than physical fatigue.  She is encouraged and does remain up in the recliner for breakfast.  RN's in to do bed change.  Encouragement given.   Follow Up Recommendations  No PT follow up     Equipment Recommendations       Recommendations for Other Services       Precautions / Restrictions Precautions Precautions: Fall Restrictions Weight Bearing Restrictions: No    Mobility  Bed Mobility Overal bed mobility: Modified Independent                Transfers Overall transfer level: Needs assistance Equipment used: Rolling walker (2 wheeled) Transfers: Sit to/from Stand Sit to Stand: Supervision            Ambulation/Gait Ambulation/Gait assistance: Supervision Gait Distance (Feet): 25 Feet Assistive device: Rolling walker (2 wheeled) Gait  Pattern/deviations: WFL(Within Functional Limits);Step-through pattern;Decreased step length - right;Decreased step length - left;Trunk flexed Gait velocity: decreased   General Gait Details: 25' x 2 to/from bathroom - self limited futher gait.  generally anxious today.   Stairs             Wheelchair Mobility    Modified Rankin (Stroke Patients Only)       Balance Overall balance assessment: Needs assistance Sitting-balance support: Bilateral upper extremity supported Sitting balance-Leahy Scale: Good     Standing balance support: Bilateral upper extremity supported Standing balance-Leahy Scale: Good                              Cognition Arousal/Alertness: Awake/alert Behavior During Therapy: Anxious Overall Cognitive Status: Within Functional Limits for tasks assessed                                        Exercises Other Exercises Other Exercises: to bathroom to void and for bathing.    General Comments        Pertinent Vitals/Pain Pain Assessment: No/denies pain    Home Living                      Prior Function            PT Goals (current goals can now be  found in the care plan section) Progress towards PT goals: Progressing toward goals    Frequency    Min 2X/week      PT Plan Current plan remains appropriate    Co-evaluation              AM-PAC PT "6 Clicks" Mobility   Outcome Measure  Help needed turning from your back to your side while in a flat bed without using bedrails?: None Help needed moving from lying on your back to sitting on the side of a flat bed without using bedrails?: None Help needed moving to and from a bed to a chair (including a wheelchair)?: A Little Help needed standing up from a chair using your arms (e.g., wheelchair or bedside chair)?: A Little Help needed to walk in hospital room?: A Little Help needed climbing 3-5 steps with a railing? : A Little 6 Click  Score: 20    End of Session Equipment Utilized During Treatment: Gait belt Activity Tolerance: Patient tolerated treatment well;Other (comment)(self limiting due to anxiety) Patient left: in chair;with call bell/phone within reach;with chair alarm set Nurse Communication: Mobility status       Time: 7218-2883 PT Time Calculation (min) (ACUTE ONLY): 17 min  Charges:  $Gait Training: 8-22 mins                    Chesley Noon, PTA 03/24/20, 9:43 AM

## 2020-03-24 NOTE — Progress Notes (Signed)
Occupational Therapy Treatment Patient Details Name: Linda Bradley MRN: [id number] DOB: 1941/06/20 Today's Date: 03/24/2020    History of present illness Per MD note: Linda Bradley is a 79 y.o. female with a history of anxiety, aortic stenosis, hypertension who comes the ED complaining of shortness of breath.  She has been feeling sick for the past 2 weeks with fatigue, decreased appetite, decreased oral intake.  MD assessment: Dyspnea, Acute hypoxic respiratory failure, HTN urgency, AKI, hypokalemia, hyponatremia, leukocytosis, elevated troponins likely secondary to new CHF vs flash pulmonary edema, and thrombocytosis. Of note: COVID in January. Pt s/p kidney biospy.   OT comments  Pt seen for OT tx this date. Pt reports just getting back to bed and encouraged by how long she sat up after PT and her progress thus far stating, "I think I'm feeling a lot better than I did this morning!" Son present and engaged throughout session. Pt denies pain, has questions regarding blood clot preventative measures. Pt/son instructed in compression stocking mgt including donning/doffing, wear schedule, and positioning using verbal instruction and visual demonstration to maximize safety and adherence to care plan. Pt/son verbalized understanding. SCDs re-applied. Pt continues to benefit from skilled OT services. Current POC remains appropriate.    Follow Up Recommendations  No OT follow up    Equipment Recommendations  None recommended by OT    Recommendations for Other Services      Precautions / Restrictions Precautions Precautions: Fall Restrictions Weight Bearing Restrictions: No       Mobility Bed Mobility               General bed mobility comments: pt declined, just back to bed  Transfers                 General transfer comment: pt declined, just back to bed    Balance                                           ADL either performed or assessed with  clinical judgement   ADL                                         General ADL Comments: Pt continues to require set up to min A for LB ADL     Vision Baseline Vision/History: Wears glasses Wears Glasses: Reading only Patient Visual Report: No change from baseline     Perception     Praxis      Cognition Arousal/Alertness: Awake/alert Behavior During Therapy: WFL for tasks assessed/performed Overall Cognitive Status: Within Functional Limits for tasks assessed                                          Exercises Other Exercises Other Exercises: Pt/son instructed in compression stocking mgt strategies and bed level exercises to support strength and activity tolerance; pt/son verbalized understanding   Shoulder Instructions       General Comments      Pertinent Vitals/ Pain       Pain Assessment: No/denies pain  Home Living  Prior Functioning/Environment              Frequency  Min 1X/week        Progress Toward Goals  OT Goals(current goals can now be found in the care plan section)  Progress towards OT goals: Progressing toward goals  Acute Rehab OT Goals Patient Stated Goal: to get my breathing under control and go home OT Goal Formulation: With patient/family Time For Goal Achievement: 03/31/20 Potential to Achieve Goals: Good  Plan Discharge plan remains appropriate;Frequency remains appropriate    Co-evaluation                 AM-PAC OT "6 Clicks" Daily Activity     Outcome Measure   Help from another person eating meals?: None Help from another person taking care of personal grooming?: None Help from another person toileting, which includes using toliet, bedpan, or urinal?: A Little Help from another person bathing (including washing, rinsing, drying)?: A Little Help from another person to put on and taking off regular upper body clothing?:  None Help from another person to put on and taking off regular lower body clothing?: A Little 6 Click Score: 21    End of Session    OT Visit Diagnosis: Unsteadiness on feet (R26.81);Muscle weakness (generalized) (M62.81)   Activity Tolerance Patient tolerated treatment well;Other (comment)(just back to bed and did not want to get back up)   Patient Left in bed;with call bell/phone within reach;with bed alarm set;with SCD's reapplied;with family/visitor present   Nurse Communication          Time: 2902-1115 OT Time Calculation (min): 15 min  Charges: OT General Charges $OT Visit: 1 Visit OT Treatments $Self Care/Home Management : 8-22 mins  Jeni Salles, MPH, MS, OTR/L ascom [phone] 03/24/20, 2:04 PM

## 2020-03-24 NOTE — Progress Notes (Signed)
[facility]  ROUNDING NOTE   Subjective:  Patient seen and evaluated at bedside. Underwent percutaneous left renal biopsy yesterday. Tolerated procedure well. Has been started on Solu-Medrol. Biopsy specimen sent off to [facility] nephropathology for expert reading.   Objective:  Vital signs in last 24 hours:  Temp:  [98 F (36.7 C)-99.1 F (37.3 C)] 98 F (36.7 C) (04/01 2008) Pulse Rate:  [72-81] 80 (04/02 1033) Resp:  [17] 17 (04/02 0513) BP: (141-150)/(51-73) 150/58 (04/02 1033) SpO2:  [94 %-98 %] 98 % (04/02 1033)  Weight change:  Filed Weights   03/16/20 1520 03/16/20 1709  Weight: 68 kg 72 kg    Intake/Output: I/O last 3 completed shifts: In: 240 [P.O.:240] Out: 201 [Urine:200; Stool:1]   Intake/Output this shift:  No intake/output data recorded.  Physical Exam: General: No acute distress  Head: Normocephalic, atraumatic. Moist oral mucosal membranes  Eyes: Anicteric  Neck: Supple, trachea midline  Lungs:  Clear to auscultation, normal effort  Heart: S1S2 no rubs  Abdomen:  Soft, nontender, bowel sounds present  Extremities: Trace peripheral edema.  Neurologic: Awake, alert, following commands  Skin: Mild mallar rash on the face  Access: None    Basic Metabolic Panel: Recent Labs  Lab 03/19/20 0557 03/19/20 0557 03/20/20 0438 03/20/20 0438 03/21/20 0551 03/21/20 0551 03/22/20 0517 03/23/20 0433 03/24/20 0439  NA 126*   < > 126*  --  125*  --  129* 128* 129*  K 3.0*   < > 3.7  --  3.3*  --  3.5 3.4* 3.6  CL 89*   < > 88*  --  86*  --  91* 90* 92*  CO2 25   < > 25  --  26  --  27 26 24   GLUCOSE 105*   < > 104*  --  95  --  106* 105* 190*  BUN 41*   < > 49*  --  54*  --  58* 54* 57*  CREATININE 2.70*   < > 3.04*  --  3.18*  --  3.24* 2.91* 3.09*  CALCIUM 8.2*  8.3*   < > 8.5*   < > 8.6*   < > 8.6* 8.6* 8.7*  PHOS 4.9*  --   --   --   --   --   --   --   --    < > = values in this interval not displayed.    Liver Function Tests: No  results for input(s): AST, ALT, ALKPHOS, BILITOT, PROT, ALBUMIN in the last 168 hours. No results for input(s): LIPASE, AMYLASE in the last 168 hours. No results for input(s): AMMONIA in the last 168 hours.  CBC: Recent Labs  Lab 03/21/20 0551 03/22/20 0517 03/22/20 1147 03/23/20 0433 03/24/20 0439  WBC 11.1* 10.8* 12.6* 11.5* 8.6  NEUTROABS  --   --  8.5*  --   --   HGB 8.3* 8.7* 9.2* 8.8* 8.5*  HCT 24.5* 24.9* 27.0* 24.9* 25.3*  MCV 83.3 81.6 83.3 82.5 84.9  PLT 354 384 436* 377 341    Cardiac Enzymes: No results for input(s): CKTOTAL, CKMB, CKMBINDEX, TROPONINI in the last 168 hours.  BNP: Invalid input(s): POCBNP  CBG: No results for input(s): GLUCAP in the last 168 hours.  Microbiology: Results for orders placed or performed during the hospital encounter of 03/16/20  Urine Culture     Status: Abnormal   Collection Time: 03/16/20  5:49 PM   Specimen: Urine, Random  Result Value  Ref Range Status   Specimen Description   Final    URINE, RANDOM Performed at [facility], [address], [place], [place] [address]    Special Requests   Final    NONE Performed at [facility], [address] [place], [place] [address]    Culture (A)  Final    <10,000 COLONIES/mL INSIGNIFICANT GROWTH Performed at [facility] [address] [place], [place] [address]    Report Status 03/17/2020 FINAL  Final  SARS CORONAVIRUS 2 (TAT 6-24 HRS) Nasopharyngeal Nasopharyngeal Swab     Status: None   Collection Time: 03/16/20  6:00 PM   Specimen: Nasopharyngeal Swab  Result Value Ref Range Status   SARS Coronavirus 2 NEGATIVE NEGATIVE Final    Comment: (NOTE) SARS-CoV-2 target nucleic acids are NOT DETECTED. The SARS-CoV-2 RNA is generally detectable in upper and lower respiratory specimens during the acute phase of infection. Negative results do not preclude SARS-CoV-2 infection, do not rule out co-infections with other pathogens, and should not be used  as the sole basis for treatment or other patient management decisions. Negative results must be combined with clinical observations, patient history, and epidemiological information. The expected result is Negative. Fact Sheet for Patients: SugarRoll.be Fact Sheet for Healthcare Providers: https://www.woods-mathews.com/ This test is not yet approved or cleared by the [place] FDA and  has been authorized for detection and/or diagnosis of SARS-CoV-2 by FDA under an Emergency Use Authorization (EUA). This EUA will remain  in effect (meaning this test can be used) for the duration of the COVID-19 declaration under Section 56 4(b)(1) of the Act, 21 U.S.C. section 360bbb-3(b)(1), unless the authorization is terminated or revoked sooner. Performed at [facility], [place] [address], [place],  [address]     Coagulation Studies: Recent Labs    03/22/20 1147  LABPROT 13.8  INR 1.1    Urinalysis: Recent Labs    03/24/20 1103  COLORURINE YELLOW*  LABSPEC 1.010  PHURINE 5.0  GLUCOSEU NEGATIVE  HGBUR MODERATE*  BILIRUBINUR NEGATIVE  KETONESUR NEGATIVE  PROTEINUR 30*  NITRITE NEGATIVE  LEUKOCYTESUR NEGATIVE      Imaging: US BIOPSY (KIDNEY)  Result Date: 03/23/2020 INDICATION: 79 year old female with a history of acute kidney injury EXAM: IMAGE GUIDED RENAL BIOPSY MEDICATIONS: None. ANESTHESIA/SEDATION: Moderate (conscious) sedation was employed during this procedure. A total of Versed 1.0 mg and Fentanyl 50 mcg was administered intravenously. Moderate Sedation Time: 11 minutes. The patient's level of consciousness and vital signs were monitored continuously by radiology nursing throughout the procedure under my direct supervision. FLUOROSCOPY TIME:  Ultrasound COMPLICATIONS: None PROCEDURE: Informed written consent was obtained from the patient after a thorough discussion of the procedural risks, benefits and alternatives. All  questions were addressed. Maximal Sterile Barrier Technique was utilized including caps, mask, sterile gowns, sterile gloves, sterile drape, hand hygiene and skin antiseptic. A timeout was performed prior to the initiation of the procedure. Patient was positioned prone position on the gantry table. Images were stored sent to PACs. Once the patient is prepped and draped in the usual sterile fashion, the skin and subcutaneous tissues overlying the left kidney were generously infiltrated 1% lidocaine for local anesthesia. Using ultrasound guidance, transverse approach, a 15 gauge guide needle was advanced into the lateral cortex of the left kidney. Once we confirmed location of the needle tip, 4 separate 16 gauge core biopsy were achieved. Two Gel-Foam pledgets were infused with a small amount of saline. The needle was removed.  Final images were stored. The patient tolerated the procedure well and remained hemodynamically stable throughout. No complications were encountered and no significant blood loss encountered. IMPRESSION: Status post ultrasound-guided medical kidney biopsy of the left kidney. Signed, Dulcy Fanny. Dellia Nims, RPVI [facility] Radiology Electronically Signed   By: Corrie Mckusick D.O.   On: 03/23/2020 11:37     Medications:   . ferumoxytol 510 mg (03/19/20 1159)  . methylPREDNISolone (SOLU-MEDROL) injection 500 mg (03/23/20 2101)   . atenolol  50 mg Oral BID  . bisacodyl  5 mg Oral Daily  . clonazePAM  0.25 mg Oral BID  . cloNIDine  0.1 mg Oral TID  . docusate sodium  200 mg Oral BID  . heparin injection (subcutaneous)  5,000 Units Subcutaneous Q8H  . hydrALAZINE  100 mg Oral Q8H  . labetalol  10 mg Intravenous Once  . scopolamine  1 patch Transdermal Q72H  . sodium chloride flush  3 mL Intravenous Once  . sodium chloride flush  3 mL Intravenous Q12H   acetaminophen **OR** acetaminophen, hydrALAZINE, magnesium hydroxide, ondansetron  (ZOFRAN) IV, ondansetron, traMADol  Assessment/ Plan:  79 y.o. female with past medical history of hypertension, aortic stenosis, hyperlipidemia, chronic kidney disease stage III a baseline creatinine 1.1 who presented to [facility] with hypertensive urgency, pulmonary edema, shortness of breath and found to have worsening acute kidney injury.  1.  Acute kidney injury/chronic kidney disease stage IIIa/proteinuria.  Baseline creatinine 1.1.  Now appears to have proteinuria, hematuria and rising creatinine.  Glomerulonephritis is certainly on the differential.  She has a very elevated ANA with 1: 1280 titer.  DS DNA negative.  MPO antiboody positive.  Renal biopsy performed 03/23/2020. -Patient successfully underwent renal biopsy yesterday.  Tolerated procedure well.  She has been started on Solu-Medrol 500 mg IV daily for a total of 3 days.  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  Patient still has significant renal dysfunction.  No urgent need for dialysis at the moment however.  We will continue to monitor renal parameters closely.  2.  Anemia of chronic kidney disease.  Hemoglobin 8.5.  Continue to monitor closely.  May need Epogen as an outpatient.  3.  Hyponatremia.  Sodium still low at 129.  Likely due to limited free water excretion and/or SIADH.  4.  Hypertension.  Continue atenolol, hydralazine, clonidine   LOS: doctor with. 8 Akeela Busk 4/2/20211:49 PM

## 2020-03-24 NOTE — Progress Notes (Signed)
D: Pt alert and oriented x 4. Pt denies experiencing any pain at this time. Pt has anxiety about urinary frequency.  A: Scheduled medications administered to pt, per MD orders. Support and encouragement provided. Frequent verbal contact made.    R: No adverse drug reactions noted. Pt complaint with medications and treatment plan. Pt interacts well with staff on the unit. Pt is stable at this time, Will continue to monitor and provide care for as ordered.

## 2020-03-25 LAB — OSMOLALITY: Osmolality: 303 mOsm/kg — ABNORMAL HIGH (ref 275–295)

## 2020-03-25 MED ORDER — CYANOCOBALAMIN 1000 MCG/ML IJ SOLN
1000.0000 ug | Freq: Every day | INTRAMUSCULAR | Status: DC
  Administered 2020-03-25 – 2020-03-28 (×4): 1000 ug via INTRAMUSCULAR
  Filled 2020-03-25 (×4): qty 1

## 2020-03-25 MED ORDER — CARVEDILOL 12.5 MG PO TABS: 12.5000 mg | ORAL_TABLET | Freq: Two times a day (BID) | ORAL | Status: DC

## 2020-03-25 NOTE — Progress Notes (Signed)
Physical Therapy Treatment Patient Details Name: Linda Bradley MRN: [id number] DOB: 1941-10-06 Today's Date: 03/25/2020    History of Present Illness Per MD note: Linda Bradley is a 79 y.o. female with a history of anxiety, aortic stenosis, hypertension who comes the ED complaining of shortness of breath.  She has been feeling sick for the past 2 weeks with fatigue, decreased appetite, decreased oral intake.  MD assessment: Dyspnea, Acute hypoxic respiratory failure, HTN urgency, AKI, hypokalemia, hyponatremia, leukocytosis, elevated troponins likely secondary to new CHF vs flash pulmonary edema, and thrombocytosis. Of note: COVID in January. Pt s/p kidney biospy.    PT Comments    At the beginning of session MD arrived, conversed with patient about diagnosis. Following, PT able to ambulate with unilateral HHA 345ft around nursing station with cuing for increased stride, overall steadiness, with fatigue following. Patient will continue to benefit from PT services to prevent decline while here as able.     Follow Up Recommendations  No PT follow up     Equipment Recommendations  None recommended by PT    Recommendations for Other Services       Precautions / Restrictions      Mobility  Bed Mobility Overal bed mobility: Modified Independent             General bed mobility comments: pt declined, just back to bed  Transfers Overall transfer level: Needs assistance Equipment used: Rolling walker (2 wheeled) Transfers: Sit to/from Stand Sit to Stand: Modified independent (Device/Increase time)         General transfer comment: Stands with ease with UE use  Ambulation/Gait Ambulation/Gait assistance: Supervision Gait Distance (Feet): 300 Feet Assistive device: 1 person hand held assist Gait Pattern/deviations: WFL(Within Functional Limits);Step-through pattern;Decreased step length - right;Decreased step length - left;Trunk flexed     General Gait Details: Patient  able to walk with 1x HHA with decreased stride, and velocity, but overall fairly normal/steady pattern   Stairs             Wheelchair Mobility    Modified Rankin (Stroke Patients Only)       Balance                                            Cognition Arousal/Alertness: Awake/alert Behavior During Therapy: WFL for tasks assessed/performed Overall Cognitive Status: Within Functional Limits for tasks assessed                                        Exercises Other Exercises Other Exercises: Patient able to complete STS transfers with ease with UE use. Utilized 1x HHA for ambulation with minimal cuing for increased stride with some carry over    General Comments        Pertinent Vitals/Pain      Home Living                      Prior Function            PT Goals (current goals can now be found in the care plan section) Acute Rehab PT Goals Patient Stated Goal: to get my breathing under control and go home PT Goal Formulation: With patient Time For Goal Achievement: 03/30/20 Potential to Achieve Goals: Good Progress towards  PT goals: Progressing toward goals    Frequency    Min 2X/week      PT Plan Current plan remains appropriate    Co-evaluation              AM-PAC PT "6 Clicks" Mobility   Outcome Measure  Help needed turning from your back to your side while in a flat bed without using bedrails?: None Help needed moving from lying on your back to sitting on the side of a flat bed without using bedrails?: None Help needed moving to and from a bed to a chair (including a wheelchair)?: A Little Help needed standing up from a chair using your arms (e.g., wheelchair or bedside chair)?: A Little Help needed to walk in hospital room?: A Little Help needed climbing 3-5 steps with a railing? : A Little 6 Click Score: 20    End of Session Equipment Utilized During Treatment: Gait belt Activity  Tolerance: Patient tolerated treatment well;Other (comment) Patient left: in chair;with call bell/phone within reach;with chair alarm set;with family/visitor present Nurse Communication: Mobility status PT Visit Diagnosis: Muscle weakness (generalized) (M62.81);Unsteadiness on feet (R26.81)     Time: 8184-0375 PT Time Calculation (min) (ACUTE ONLY): 22 min  Charges:  $Gait Training: 8-22 mins                     Shelton Silvas PT, DPT   [place] 03/25/2020, 2:02 PM

## 2020-03-25 NOTE — Progress Notes (Signed)
[facility]  ROUNDING NOTE   Subjective:   Working with physical therapy this morning when examined. Family member at bedside.  Started on pulse dose of methylprednisolone.    Objective:  Vital signs in last 24 hours:  Temp:  [98 F (36.7 C)-98.6 F (37 C)] 98 F (36.7 C) (04/03 0830) Pulse Rate:  [68-82] 68 (04/03 0830) Resp:  [16-17] 17 (04/03 0830) BP: (138-155)/(63-76) 146/69 (04/03 0830) SpO2:  [94 %-96 %] 96 % (04/03 0830)  Weight change:  Filed Weights   03/16/20 1520 03/16/20 1709  Weight: 68 kg 72 kg    Intake/Output: I/O last 3 completed shifts: In: 3 [I.V.:3] Out: 201 [Urine:201]   Intake/Output this shift:  No intake/output data recorded.  Physical Exam: General: No acute distress  Head: Normocephalic, atraumatic. Moist oral mucosal membranes  Eyes: Anicteric  Neck: Supple, trachea midline  Lungs:  Clear to auscultation, normal effort  Heart: regular  Abdomen:  Soft, nontender, bowel sounds present  Extremities: No peripheral edema.  Neurologic: Awake, alert, following commands  Skin: Mild mallar rash on the face  Access: None    Basic Metabolic Panel: Recent Labs  Lab 03/19/20 0557 03/19/20 0557 03/20/20 0438 03/20/20 0438 03/21/20 0551 03/21/20 0551 03/22/20 0517 03/23/20 0433 03/24/20 0439  NA 126*   < > 126*  --  125*  --  129* 128* 129*  K 3.0*   < > 3.7  --  3.3*  --  3.5 3.4* 3.6  CL 89*   < > 88*  --  86*  --  91* 90* 92*  CO2 25   < > 25  --  26  --  27 26 24   GLUCOSE 105*   < > 104*  --  95  --  106* 105* 190*  BUN 41*   < > 49*  --  54*  --  58* 54* 57*  CREATININE 2.70*   < > 3.04*  --  3.18*  --  3.24* 2.91* 3.09*  CALCIUM 8.2*  8.3*   < > 8.5*   < > 8.6*   < > 8.6* 8.6* 8.7*  PHOS 4.9*  --   --   --   --   --   --   --   --    < > = values in this interval not displayed.    Liver Function Tests: No results for input(s): AST, ALT, ALKPHOS, BILITOT, PROT, ALBUMIN in the last 168 hours. No results for  input(s): LIPASE, AMYLASE in the last 168 hours. No results for input(s): AMMONIA in the last 168 hours.  CBC: Recent Labs  Lab 03/21/20 0551 03/22/20 0517 03/22/20 1147 03/23/20 0433 03/24/20 0439  WBC 11.1* 10.8* 12.6* 11.5* 8.6  NEUTROABS  --   --  8.5*  --   --   HGB 8.3* 8.7* 9.2* 8.8* 8.5*  HCT 24.5* 24.9* 27.0* 24.9* 25.3*  MCV 83.3 81.6 83.3 82.5 84.9  PLT 354 384 436* 377 341    Cardiac Enzymes: No results for input(s): CKTOTAL, CKMB, CKMBINDEX, TROPONINI in the last 168 hours.  BNP: Invalid input(s): POCBNP  CBG: No results for input(s): GLUCAP in the last 168 hours.  Microbiology: Results for orders placed or performed during the hospital encounter of 03/16/20  Urine Culture     Status: Abnormal   Collection Time: 03/16/20  5:49 PM   Specimen: Urine, Random  Result Value Ref Range Status   Specimen Description   Final  URINE, RANDOM Performed at [facility], [address] [place], [place] [address]    Special Requests   Final    NONE Performed at [facility], [address] [place], [place] [address]    Culture (A)  Final    <10,000 COLONIES/mL INSIGNIFICANT GROWTH Performed at [facility] [address], [place], [place] [address]    Report Status 03/17/2020 FINAL  Final  SARS CORONAVIRUS 2 (TAT 6-24 HRS) Nasopharyngeal Nasopharyngeal Swab     Status: None   Collection Time: 03/16/20  6:00 PM   Specimen: Nasopharyngeal Swab  Result Value Ref Range Status   SARS Coronavirus 2 NEGATIVE NEGATIVE Final    Comment: (NOTE) SARS-CoV-2 target nucleic acids are NOT DETECTED. The SARS-CoV-2 RNA is generally detectable in upper and lower respiratory specimens during the acute phase of infection. Negative results do not preclude SARS-CoV-2 infection, do not rule out co-infections with other pathogens, and should not be used as the sole basis for treatment or other patient management decisions. Negative results must be  combined with clinical observations, patient history, and epidemiological information. The expected result is Negative. Fact Sheet for Patients: SugarRoll.be Fact Sheet for Healthcare Providers: https://www.woods-mathews.com/ This test is not yet approved or cleared by the [place] FDA and  has been authorized for detection and/or diagnosis of SARS-CoV-2 by FDA under an Emergency Use Authorization (EUA). This EUA will remain  in effect (meaning this test can be used) for the duration of the COVID-19 declaration under Section 56 4(b)(1) of the Act, 21 U.S.C. section 360bbb-3(b)(1), unless the authorization is terminated or revoked sooner. Performed at [facility], [place] [address], [place],  [address]     Coagulation Studies: No results for input(s): LABPROT, INR in the last 72 hours.  Urinalysis: Recent Labs    03/24/20 1103  COLORURINE YELLOW*  LABSPEC 1.010  PHURINE 5.0  GLUCOSEU NEGATIVE  HGBUR MODERATE*  BILIRUBINUR NEGATIVE  KETONESUR NEGATIVE  PROTEINUR 30*  NITRITE NEGATIVE  LEUKOCYTESUR NEGATIVE      Imaging: No results found.   Medications:   . ferumoxytol 510 mg (03/19/20 1159)  . methylPREDNISolone (SOLU-MEDROL) injection 500 mg (03/24/20 1737)   . atenolol  50 mg Oral BID  . bisacodyl  5 mg Oral Daily  . clonazePAM  0.25 mg Oral BID  . cloNIDine  0.1 mg Oral TID  . cyanocobalamin  1,000 mcg Intramuscular Daily  . docusate sodium  200 mg Oral BID  . heparin injection (subcutaneous)  5,000 Units Subcutaneous Q8H  . hydrALAZINE  100 mg Oral Q8H  . labetalol  10 mg Intravenous Once  . scopolamine  1 patch Transdermal Q72H  . sodium chloride flush  3 mL Intravenous Once  . sodium chloride flush  3 mL Intravenous Q12H   acetaminophen **OR** acetaminophen, hydrALAZINE, magnesium hydroxide, ondansetron (ZOFRAN) IV, ondansetron, traMADol  Assessment/ Plan:   [name] is a 79 y.o.  white female with hypertension, aortic stenosis, hyperlipidemia who is admitted to [facility] on 03/16/2020 for Acute pulmonary edema ([place]) [J81.0] Dyspnea [R06.00] AKI (acute kidney injury) ([place]) [N17.9] Acute congestive heart failure, unspecified heart failure type ([place]) [I50.9]   1.  Acute kidney injury with proteinuria and hematuria on chronic kidney disease stage IIIA: baseline creatinine of 1.1, GFR of 49 on 06/21/2019.  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 Pending renal biopsy report.  Negative for anti-GBM.  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 Treatment would then be rituximab or cyclophosphamide.  - holding lisinopril  2. Hypertension: elevated - steroid driven. Home regimen of carvedilol, hydralazine, clonidine and lisinopril.  Currently on clonidine, atenolol, hydralazine.  - Exchange atenolol for carvedilol.  - holding lisinopril  3. Hyponatremia: with renal failure. Monitor volume status   LOS: doctor with. 9 Lautaro Koral 4/3/20213:01 PM

## 2020-03-25 NOTE — Progress Notes (Signed)
PROGRESS NOTE    ERSEL WADLEIGH  GGE:[id number] DOB: [birth year] DOA: 03/16/2020 PCP: Lynnell Jude, MD   HPI: 79 y/o F w/ PMH of HTN, anxiety who presents w/ shortness of breath x day of admission. Hx was obtained from pt and pt's daughter who is at bedside. The shortness of breath is at rest as well as with exertion. Of note, pt's cardiologist at [facility] recently changed some of her medications secondary to AKI. Pt was taken off of HCTZ, atenolol and pt was put on coreg approx 3 weeks ago. Since the pt's cardiac meds were changed, pt's BP has been poorly controlled. Pt denies any PMH of CHF or MI. Pt also c/o intermittent chills, dizziness and nausea. Pt denies any fevers, sweating, chest pain, vomiting, cough, abd pain, dysuria, urinary urgency, urinary frequency, diarrhea or constipation.  Pt was initially diuresed w/ IV lasix for likely acute diastolic CHF. Pt was initially requiring supplemental oxygen but this has since been weaned off. Furthermore, pt presented w/ AKI on CKDIIIb with Cr rising daily and as a result nephro was consulted.  Lasix has since been held. An autoimmune report was started which has showed  ESR is elevated, ANA is elevated, decreased total complement, & elevated MPO antibody. 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 Renal biopsy today.  IV steroids will be started after renal biopsy today as per nephro. Finally, pt has had uncontrolled HTN since her outside cardiologist changed her BP as pt's Cr was rising as an outpatient as well.    4/3: Patient seen and examined, with a family member at bedside.  Feeling improvement in the shortness of breath, feels better and walking around.  Creatinine 3.09  4/1 Status post renal biopsy with interventional radiology.   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  Patient on high-dose IV steroid burst per nephrology.   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 03/23/20 s/p Left Renal Biopsy, report pending Plan: s/p Solu-Medrol 500 mg daily x3 days, 4/1 to 4/3  Follow  renal biopsy report Hydrochlorothiazide Holding home ACE inhibitor Daily renal function Avoid nephrotoxins Follow nephrology recommendations  Dyspnea  possibly secondary to new acute diastolic CHF vs flash pulmonary edema Much improved Currently on room air Speaking in complete sentences without increased work of breathing Echo shows EF 46-50%, grade I diastolic dysfunction, & moderate to severe aortic stenosis Cardiology was following, signed off Plan: Continue to hold lasix secondary to AKI or CKD.  Monitor I/Os.   Acute hypoxic respiratory failure, resolved secondary to above. Weaned off of supplemental oxygen  HTN urgency  w/ recent hx of uncontrolled HTN.  Well controlled overnight with Continue clonidine Continue hydralazine IV hydralazine as needed Continue atenolol ACE inhibitor on hold   Likely anemia of chronic disease  etiology unclear, possibly secondary to CKD.  No need for a transfusion at this time.  Will continue on iron supplements as pt's iron is low.  Monitoring DC, transfuse as needed  Vitamin B12 deficiency:  Vitamin B12 1000 mcg IM injection daily for 7 days, can be transitioned to oral vitamin D supplement on discharge.  Repeat vitamin B12 level after 3 months and follow with PCP.    Hypokalemia:  Resolved, currently potassium 3.6   Hyponatremia,  unknown cause Serum osmolality 303 Follow nephrologist Will continue to monitor   Hyperglycemia most likely secondary to steroids Continue monitor BMP daily   DVT prophylaxis: lovenox Code Status: full  Family Communication: Patient seen at bedside.  All questions answered  Disposition Plan: Anticipate  return to previous home environment.  At this time patient is not medically stable.  Current barriers to discharge include acute kidney injury on chronic kidney disease.  Patient status post renal biopsy.  On high-dose steroids.   Will need definitive diagnosis and stabilization of creatinine prior to consideration of discharge.   Consultants:  Cardio nephro   Procedures: 4/1 left renal biopsy   Antimicrobials:    Subjective: Patient seen and examined Feels well, no complaints, walking around well.  Family at bedside  Objective: Vitals:   03/24/20 2117 03/25/20 0007 03/25/20 0526 03/25/20 0830  BP: (!) 155/69 (!) 151/70 138/76 (!) 146/69  Pulse: 78 82 77 68  Resp: 16 16 16 17   Temp: 98.6 F (37 C) 98.2 F (36.8 C)  98 F (36.7 C)  TempSrc: Oral Oral  Oral  SpO2: 95% 94% 96% 96%  Weight:      Height:        Intake/Output Summary (Last 24 hours) at 03/25/2020 1344 Last data filed at 03/24/2020 2358 Gross per 24 hour  Intake 3 ml  Output 1 ml  Net 2 ml   Filed Weights   03/16/20 1520 03/16/20 1709  Weight: 68 kg 72 kg    Examination:  General exam: Appears calm and comfortable  Respiratory system: diminished breath sounds b/l/. No rhonchi Cardiovascular system: S1 & S2 +. No  rubs, gallops or clicks.  Gastrointestinal system: Abdomen is nondistended, soft and nontender. Normal bowel sounds heard. Central nervous system: Alert and oriented. Moves all 4 extremities  Psychiatry: Judgement and insight appear normal. Normal mood and affect    Data Reviewed: I have personally reviewed following labs and imaging studies  CBC: Recent Labs  Lab 03/21/20 0551 03/22/20 0517 03/22/20 1147 03/23/20 0433 03/24/20 0439  WBC 11.1* 10.8* 12.6* 11.5* 8.6  NEUTROABS  --   --  8.5*  --   --   HGB 8.3* 8.7* 9.2* 8.8* 8.5*  HCT 24.5* 24.9* 27.0* 24.9* 25.3*  MCV 83.3 81.6 83.3 82.5 84.9  PLT 354 384 436* 377 887   Basic Metabolic Panel: Recent Labs  Lab 03/19/20 0557  03/19/20 0557 03/20/20 0438 03/21/20 0551 03/22/20 0517 03/23/20 0433 03/24/20 0439  NA 126*   < > 126* 125* 129* 128* 129*  K 3.0*   < > 3.7 3.3* 3.5 3.4* 3.6  CL 89*   < > 88* 86* 91* 90* 92*  CO2 25   < > 25 26 27 26 24   GLUCOSE 105*   < > 104* 95 106* 105* 190*  BUN 41*   < > 49* 54* 58* 54* 57*  CREATININE 2.70*   < > 3.04* 3.18* 3.24* 2.91* 3.09*  CALCIUM 8.2*  8.3*   < > 8.5* 8.6* 8.6* 8.6* 8.7*  PHOS 4.9*  --   --   --   --   --   --    < > = values in this interval not displayed.   GFR: Estimated Creatinine Clearance: 13.2 mL/min (A) (by C-G formula based on SCr of 3.09 mg/dL (H)). Liver Function Tests: No results for input(s): AST, ALT, ALKPHOS, BILITOT, PROT, ALBUMIN in the last 168 hours. No results for input(s): LIPASE, AMYLASE in  the last 168 hours. No results for input(s): AMMONIA in the last 168 hours. Coagulation Profile: Recent Labs  Lab 03/22/20 1147  INR 1.1   Cardiac Enzymes: No results for input(s): CKTOTAL, CKMB, CKMBINDEX, TROPONINI in the last 168 hours. BNP (last 3 results) No results for input(s): PROBNP in the last 8760 hours. HbA1C: No results for input(s): HGBA1C in the last 72 hours. CBG: No results for input(s): GLUCAP in the last 168 hours. Lipid Profile: No results for input(s): CHOL, HDL, LDLCALC, TRIG, CHOLHDL, LDLDIRECT in the last 72 hours. Thyroid Function Tests: No results for input(s): TSH, T4TOTAL, FREET4, T3FREE, THYROIDAB in the last 72 hours. Anemia Panel: No results for input(s): VITAMINB12, FOLATE, FERRITIN, TIBC, IRON, RETICCTPCT in the last 72 hours. Sepsis Labs: No results for input(s): PROCALCITON, LATICACIDVEN in the last 168 hours.  Recent Results (from the past 240 hour(s))  Urine Culture     Status: Abnormal   Collection Time: 03/16/20  5:49 PM   Specimen: Urine, Random  Result Value Ref Range Status   Specimen Description   Final    URINE, RANDOM Performed at [facility], [address],  [place], [place] [address]    Special Requests   Final    NONE Performed at [facility] [address], [place], [place] [address]    Culture (A)  Final    <10,000 COLONIES/mL INSIGNIFICANT GROWTH Performed at [facility] [address] [place], [place] [address]    Report Status 03/17/2020 FINAL  Final  SARS CORONAVIRUS 2 (TAT 6-24 HRS) Nasopharyngeal Nasopharyngeal Swab     Status: None   Collection Time: 03/16/20  6:00 PM   Specimen: Nasopharyngeal Swab  Result Value Ref Range Status   SARS Coronavirus 2 NEGATIVE NEGATIVE Final    Comment: (NOTE) SARS-CoV-2 target nucleic acids are NOT DETECTED. The SARS-CoV-2 RNA is generally detectable in upper and lower respiratory specimens during the acute phase of infection. Negative results do not preclude SARS-CoV-2 infection, do not rule out co-infections with other pathogens, and should not be used as the sole basis for treatment or other patient management decisions. Negative results must be combined with clinical observations, patient history, and epidemiological information. The expected result is Negative. Fact Sheet for Patients: SugarRoll.be Fact Sheet for Healthcare Providers: https://www.woods-mathews.com/ This test is not yet approved or cleared by the [place] FDA and  has been authorized for detection and/or diagnosis of SARS-CoV-2 by FDA under an Emergency Use Authorization (EUA). This EUA will remain  in effect (meaning this test can be used) for the duration of the COVID-19 declaration under Section 56 4(b)(1) of the Act, 21 U.S.C. section 360bbb-3(b)(1), unless the authorization is terminated or revoked sooner. Performed at [facility], [place] [address], [place], [place] [address]          Radiology Studies: No results found.      Scheduled Meds: . atenolol  50 mg Oral BID  . bisacodyl  5 mg Oral Daily  . clonazePAM  0.25 mg Oral BID   . cloNIDine  0.1 mg Oral TID  . cyanocobalamin  1,000 mcg Intramuscular Daily  . docusate sodium  200 mg Oral BID  . heparin injection (subcutaneous)  5,000 Units Subcutaneous Q8H  . hydrALAZINE  100 mg Oral Q8H  . labetalol  10 mg Intravenous Once  . scopolamine  1 patch Transdermal Q72H  . sodium chloride flush  3 mL Intravenous Once  . sodium chloride flush  3 mL Intravenous Q12H   Continuous Infusions: . ferumoxytol 510 mg (03/19/20 1159)  . methylPREDNISolone (SOLU-MEDROL) injection 500 mg (03/24/20 1737)     LOS: 9 days    Time spent: 30 mins    Val Riles, MD [facility] Pager [phone]  If 7PM-7AM, please contact night-coverage [email or web address] 03/25/2020, 1:44 PM  '

## 2020-03-25 NOTE — Plan of Care (Signed)

## 2020-03-26 LAB — BASIC METABOLIC PANEL
Anion gap: 12 (ref 5–15)
BUN: 89 mg/dL — ABNORMAL HIGH (ref 8–23)
CO2: 24 mmol/L (ref 22–32)
Calcium: 9 mg/dL (ref 8.9–10.3)
Chloride: 94 mmol/L — ABNORMAL LOW (ref 98–111)
Creatinine, Ser: 3.47 mg/dL — ABNORMAL HIGH (ref 0.44–1.00)
GFR calc Af Amer: 14 mL/min — ABNORMAL LOW (ref >60)
GFR calc non Af Amer: 12 mL/min — ABNORMAL LOW (ref >60)
Glucose, Bld: 176 mg/dL — ABNORMAL HIGH (ref 70–99)
Potassium: 4.5 mmol/L (ref 3.5–5.1)
Sodium: 130 mmol/L — ABNORMAL LOW (ref 135–145)

## 2020-03-26 LAB — CBC
HCT: 25.6 % — ABNORMAL LOW (ref 36.0–46.0)
Hemoglobin: 8.8 g/dL — ABNORMAL LOW (ref 12.0–15.0)
MCH: 28.9 pg (ref 26.0–34.0)
MCHC: 34.4 g/dL (ref 30.0–36.0)
MCV: 83.9 fL (ref 80.0–100.0)
Platelets: 441 10*3/uL — ABNORMAL HIGH (ref 150–400)
RBC: 3.05 MIL/uL — ABNORMAL LOW (ref 3.87–5.11)
RDW: 13.5 % (ref 11.5–15.5)
WBC: 14 10*3/uL — ABNORMAL HIGH (ref 4.0–10.5)
nRBC: 0 % (ref 0.0–0.2)

## 2020-03-26 MED ORDER — VALACYCLOVIR HCL 500 MG PO TABS
500.0000 mg | ORAL_TABLET | Freq: Every day | ORAL | Status: DC
  Administered 2020-03-26 – 2020-03-28 (×3): 500 mg via ORAL
  Filled 2020-03-26 (×3): qty 1

## 2020-03-26 MED ORDER — PREDNISONE 50 MG PO TABS
70.0000 mg | ORAL_TABLET | Freq: Every day | ORAL | Status: DC
  Administered 2020-03-27 – 2020-03-28 (×2): 70 mg via ORAL
  Filled 2020-03-26 (×2): qty 1

## 2020-03-26 MED ORDER — MAGIC MOUTHWASH
5.0000 mL | Freq: Four times a day (QID) | ORAL | Status: DC | PRN
  Administered 2020-03-26: 5 mL via ORAL
  Filled 2020-03-26: qty 5
  Filled 2020-03-26: qty 10

## 2020-03-26 MED ORDER — ATENOLOL 25 MG PO TABS
12.5000 mg | ORAL_TABLET | Freq: Every day | ORAL | Status: DC
  Administered 2020-03-26: 12.5 mg via ORAL
  Filled 2020-03-26: qty 1

## 2020-03-26 MED ORDER — HYDRALAZINE HCL 50 MG PO TABS
50.0000 mg | ORAL_TABLET | Freq: Two times a day (BID) | ORAL | Status: DC
  Administered 2020-03-26 – 2020-03-28 (×5): 50 mg via ORAL
  Filled 2020-03-26 (×5): qty 1

## 2020-03-26 MED ORDER — POLYETHYLENE GLYCOL 3350 17 G PO PACK
17.0000 g | PACK | Freq: Every day | ORAL | Status: DC
  Filled 2020-03-26: qty 1

## 2020-03-26 MED ORDER — MAGIC MOUTHWASH W/LIDOCAINE: 5.0000 mL | Freq: Four times a day (QID) | ORAL | Status: DC | PRN

## 2020-03-26 MED ORDER — NEPRO/CARBSTEADY PO LIQD
237.0000 mL | ORAL | Status: DC
  Administered 2020-03-27 – 2020-03-28 (×2): 237 mL via ORAL

## 2020-03-26 MED ORDER — ATENOLOL 25 MG PO TABS: 25.0000 mg | ORAL_TABLET | Freq: Two times a day (BID) | ORAL | Status: DC

## 2020-03-26 MED ORDER — CLONIDINE HCL 0.1 MG PO TABS
0.1000 mg | ORAL_TABLET | Freq: Two times a day (BID) | ORAL | Status: DC
  Administered 2020-03-26 – 2020-03-28 (×4): 0.1 mg via ORAL
  Filled 2020-03-26 (×4): qty 1

## 2020-03-26 MED ORDER — ATENOLOL 25 MG PO TABS
25.0000 mg | ORAL_TABLET | Freq: Every day | ORAL | Status: DC
  Administered 2020-03-27 – 2020-03-28 (×2): 25 mg via ORAL
  Filled 2020-03-26 (×2): qty 1

## 2020-03-26 MED ORDER — LIDOCAINE VISCOUS HCL 2 % MT SOLN
5.0000 mL | Freq: Four times a day (QID) | OROMUCOSAL | Status: DC | PRN
  Filled 2020-03-26: qty 15

## 2020-03-26 NOTE — Progress Notes (Addendum)
[facility]  ROUNDING NOTE   Subjective:   Daughter at bedside. Patient states she is feeling better   Objective:  Vital signs in last 24 hours:  Temp:  [97.7 F (36.5 C)-98.1 F (36.7 C)] 97.9 F (36.6 C) (04/04 0746) Pulse Rate:  [67-76] 67 (04/04 0746) Resp:  [16-17] 16 (04/04 0746) BP: (138-158)/(67-81) 155/72 (04/04 0746) SpO2:  [94 %-96 %] 94 % (04/04 0746)  Weight change:  Filed Weights   03/16/20 1520 03/16/20 1709  Weight: 68 kg 72 kg    Intake/Output: I/O last 3 completed shifts: In: 354 [P.O.:240; I.V.:6; IV Piggyback:108] Out: 1 [Urine:1]   Intake/Output this shift:  Total I/O In: 117 [IV Piggyback:117] Out: -   Physical Exam: General: No acute distress  Head: +oral lesions   Eyes: Anicteric  Neck: Supple, trachea midline  Lungs:  Clear to auscultation, normal effort  Heart: regular  Abdomen:  Soft, nontender, bowel sounds present  Extremities: No peripheral edema.  Neurologic: Awake, alert, following commands  Skin: Mild mallar rash on the face  Access: None    Basic Metabolic Panel: Recent Labs  Lab 03/21/20 0551 03/21/20 0551 03/22/20 0517 03/22/20 0517 03/23/20 0433 03/24/20 0439 03/26/20 0430  NA 125*  --  129*  --  128* 129* 130*  K 3.3*  --  3.5  --  3.4* 3.6 4.5  CL 86*  --  91*  --  90* 92* 94*  CO2 26  --  27  --  26 24 24   GLUCOSE 95  --  106*  --  105* 190* 176*  BUN 54*  --  58*  --  54* 57* 89*  CREATININE 3.18*  --  3.24*  --  2.91* 3.09* 3.47*  CALCIUM 8.6*   < > 8.6*   < > 8.6* 8.7* 9.0   < > = values in this interval not displayed.    Liver Function Tests: No results for input(s): AST, ALT, ALKPHOS, BILITOT, PROT, ALBUMIN in the last 168 hours. No results for input(s): LIPASE, AMYLASE in the last 168 hours. No results for input(s): AMMONIA in the last 168 hours.  CBC: Recent Labs  Lab 03/22/20 0517 03/22/20 1147 03/23/20 0433 03/24/20 0439 03/26/20 0430  WBC 10.8* 12.6* 11.5* 8.6 14.0*   NEUTROABS  --  8.5*  --   --   --   HGB 8.7* 9.2* 8.8* 8.5* 8.8*  HCT 24.9* 27.0* 24.9* 25.3* 25.6*  MCV 81.6 83.3 82.5 84.9 83.9  PLT 384 436* 377 341 441*    Cardiac Enzymes: No results for input(s): CKTOTAL, CKMB, CKMBINDEX, TROPONINI in the last 168 hours.  BNP: Invalid input(s): POCBNP  CBG: No results for input(s): GLUCAP in the last 168 hours.  Microbiology: Results for orders placed or performed during the hospital encounter of 03/16/20  Urine Culture     Status: Abnormal   Collection Time: 03/16/20  5:49 PM   Specimen: Urine, Random  Result Value Ref Range Status   Specimen Description   Final    URINE, RANDOM Performed at [facility], [address], [place], [place] [address]    Special Requests   Final    NONE Performed at [facility], [address], [place], [place] [address]    Culture (A)  Final    <10,000 COLONIES/mL INSIGNIFICANT GROWTH Performed at [place] [facility] [address] [place], [place] [address]    Report Status 03/17/2020 FINAL  Final  SARS CORONAVIRUS 2 (  TAT 6-24 HRS) Nasopharyngeal Nasopharyngeal Swab     Status: None   Collection Time: 03/16/20  6:00 PM   Specimen: Nasopharyngeal Swab  Result Value Ref Range Status   SARS Coronavirus 2 NEGATIVE NEGATIVE Final    Comment: (NOTE) SARS-CoV-2 target nucleic acids are NOT DETECTED. The SARS-CoV-2 RNA is generally detectable in upper and lower respiratory specimens during the acute phase of infection. Negative results do not preclude SARS-CoV-2 infection, do not rule out co-infections with other pathogens, and should not be used as the sole basis for treatment or other patient management decisions. Negative results must be combined with clinical observations, patient history, and epidemiological information. The expected result is Negative. Fact Sheet for Patients: SugarRoll.be Fact Sheet for Healthcare  Providers: https://www.woods-mathews.com/ This test is not yet approved or cleared by the [place] FDA and  has been authorized for detection and/or diagnosis of SARS-CoV-2 by FDA under an Emergency Use Authorization (EUA). This EUA will remain  in effect (meaning this test can be used) for the duration of the COVID-19 declaration under Section 56 4(b)(1) of the Act, 21 U.S.C. section 360bbb-3(b)(1), unless the authorization is terminated or revoked sooner. Performed at [facility], [place] [address] [place], [place] [address]     Coagulation Studies: No results for input(s): LABPROT, INR in the last 72 hours.  Urinalysis: Recent Labs    03/24/20 1103  COLORURINE YELLOW*  LABSPEC 1.010  PHURINE 5.0  GLUCOSEU NEGATIVE  HGBUR MODERATE*  BILIRUBINUR NEGATIVE  KETONESUR NEGATIVE  PROTEINUR 30*  NITRITE NEGATIVE  LEUKOCYTESUR NEGATIVE      Imaging: No results found.   Medications:    . atenolol  12.5 mg Oral Daily  . bisacodyl  5 mg Oral Daily  . clonazePAM  0.25 mg Oral BID  . cloNIDine  0.1 mg Oral TID  . cyanocobalamin  1,000 mcg Intramuscular Daily  . docusate sodium  200 mg Oral BID  . heparin injection (subcutaneous)  5,000 Units Subcutaneous Q8H  . hydrALAZINE  50 mg Oral BID  . scopolamine  1 patch Transdermal Q72H  . sodium chloride flush  3 mL Intravenous Once  . sodium chloride flush  3 mL Intravenous Q12H   acetaminophen **OR** acetaminophen, hydrALAZINE, magnesium hydroxide, ondansetron (ZOFRAN) IV, ondansetron, traMADol  Assessment/ Plan:   [name] is a 79 y.o. white female with hypertension, aortic stenosis, hyperlipidemia who is admitted to [facility] on 03/16/2020 for Acute pulmonary edema ([place]) [J81.0] Dyspnea [R06.00] AKI (acute kidney injury) ([place]) [N17.9] Acute congestive heart failure, unspecified heart failure type ([place]) [I50.9]   1.  Acute kidney injury with proteinuria and hematuria on chronic kidney disease  stage IIIA: baseline creatinine of 1.1, GFR of 49 on 06/21/2019.  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 Pending renal biopsy report.  BUN is elevated due to high dose steroids.  Negative for anti-GBM.  Nonnephrotic range proteinuria (Nephritic Syndrome) - Completed pulse steroids x three days.  - Start prednisone 70mg  PO daily tomorrow - Pending biopsy results. 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 Treatment would then be rituximab or cyclophosphamide.  - holding lisinopril  2. Hypertension: elevated - steroid driven. Home regimen of carvedilol, hydralazine, clonidine and lisinopril. Patient states that she cannot tolerate carvedilol Currently on clonidine, atenolol, hydralazine.  - holding lisinopril  3. Hyponatremia: with renal failure. Monitor volume status  4. Oral ulcers - magic mouthwash - valacyclovir   LOS: doctor with. [place] 4/4/202111:53 AM

## 2020-03-26 NOTE — Plan of Care (Signed)

## 2020-03-26 NOTE — Progress Notes (Addendum)
Initial Nutrition Assessment  DOCUMENTATION CODES:   Obesity unspecified  INTERVENTION:   Nepro Shake po daily, each supplement provides 425 kcal and 19 grams protein  Low sodium diet   Dailey weights   NUTRITION DIAGNOSIS:   Inadequate oral intake related to acute illness as evidenced by per patient/family report.  GOAL:   Patient will meet greater than or equal to 90% of their needs  MONITOR:   PO intake, Supplement acceptance, Labs, Weight trends, Skin, I & O's  REASON FOR ASSESSMENT:   LOS    ASSESSMENT:   79 y.o. white female with hypertension, aortic stenosis, hyperlipidemia who is admitted to [facility] on 03/16/2020 for Acute pulmonary edema and AKI/CKD III   Pt s/p renal biopsy 4/1  Pt reports poor appetite and oral intake for 2 weeks pta. Pt with fairly good appetite and oral intake in hospital; pt eating anywhere from 30-100% of meals. RD will add supplements to help pt meet her estimated needs. RD will also change pt over to a low sodium diet as a renal diet is very restrictive and pt's electrolytes are wnl. Per chart, pt appears fairly weight stable at baseline.   Medications reviewed and include: dulcolax, B12, colace, heparin, prednisone  Labs reviewed: Na 130(L), BUN 89(L), creat 3.47(H) Wbc- 14.0(H), Hgb 8.8(L), Hct 25.6(L)  NUTRITION - FOCUSED PHYSICAL EXAM Unable to perform at this time   Diet Order:   Diet Order            Diet renal with fluid restriction Room service appropriate? Yes; Fluid consistency: Thin  Diet effective now             EDUCATION NEEDS:   No education needs have been identified at this time  Skin:  Skin Assessment: Reviewed RN Assessment  Last BM:  4/3- TYPE 5  Height:   Ht Readings from Last 1 Encounters:  03/16/20 4\' 11"  (1.499 m)    Weight:   Wt Readings from Last 1 Encounters:  03/16/20 72 kg    Ideal Body Weight:  44.5 kg  BMI:  Body mass index is 32.06 kg/m.  Estimated Nutritional Needs:    Kcal:  1400-1600kcal/day  Protein:  70-80g/day  Fluid:  >1.2L/day  Koleen Distance MS, RD, LDN Please refer to [facility] for RD and/or RD on-call/weekend/after hours pager

## 2020-03-26 NOTE — Progress Notes (Signed)
PROGRESS NOTE    Linda Bradley  AUQ:[id number] DOB: [birth year] DOA: 03/16/2020 PCP: Lynnell Jude, MD   HPI: 79 y/o F w/ PMH of HTN, anxiety who presents w/ shortness of breath x day of admission. Hx was obtained from pt and pt's daughter who is at bedside. The shortness of breath is at rest as well as with exertion. Of note, pt's cardiologist at [facility] recently changed some of her medications secondary to AKI. Pt was taken off of HCTZ, atenolol and pt was put on coreg approx 3 weeks ago. Since the pt's cardiac meds were changed, pt's BP has been poorly controlled. Pt denies any PMH of CHF or MI. Pt also c/o intermittent chills, dizziness and nausea. Pt denies any fevers, sweating, chest pain, vomiting, cough, abd pain, dysuria, urinary urgency, urinary frequency, diarrhea or constipation.  Pt was initially diuresed w/ IV lasix for likely acute diastolic CHF. Pt was initially requiring supplemental oxygen but this has since been weaned off. Furthermore, pt presented w/ AKI on CKDIIIb with Cr rising daily and as a result nephro was consulted.  Lasix has since been held. An autoimmune report was started which has showed  ESR is elevated, ANA is elevated, decreased total complement, & elevated MPO antibody. 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 Renal biopsy today.  IV steroids will be started after renal biopsy today as per nephro. Finally, pt has had uncontrolled HTN since her outside cardiologist changed her BP as pt's Cr was rising as an outpatient as well.    4/3: Patient seen and examined, with a family member at bedside.  Feeling improvement in the shortness of breath, feels better and walking around.  Creatinine 3.09  4/1 Status post renal biopsy with interventional radiology.   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  Patient on high-dose IV steroid burst per nephrology.   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 daily as per nephrologist Follow  renal biopsy report Hydrochlorothiazide Holding home ACE inhibitor Daily renal function  Avoid nephrotoxins Follow nephrology recommendations  HTN urgency  w/ recent hx of uncontrolled HTN.  Continue clonidine 0.1 mg po BID PTA dose Continue hydralazine 50 mg po BID PTA dose IV hydralazine as needed 4/3 Atenolol 12.5 mg pod, 4/4 increased atenolol 25 mg p.o. daily renal dose (At home patient was taking atenolol 50 twice daily) ACE inhibitor on hold   Dyspnea  possibly secondary to new acute diastolic CHF vs flash pulmonary edema Much improved Currently on room air Speaking in complete sentences without increased work of breathing Echo shows EF 63-89%, grade I diastolic dysfunction, & moderate to severe aortic stenosis Cardiology was following, signed off Plan: Continue to hold lasix secondary to AKI or CKD.  Monitor I/Os.   Acute hypoxic respiratory failure, resolved secondary to above. Weaned off of supplemental oxygen   Likely anemia of chronic disease  etiology unclear, possibly secondary to CKD.  No need for a transfusion at this time.  Will continue on iron supplements as pt's iron is low.  Monitoring DC, transfuse as needed  Vitamin B12 deficiency:  Vitamin B12 1000  mcg IM injection daily for 7 days, can be transitioned to oral vitamin D supplement on discharge.  Repeat vitamin B12 level after 3 months and follow with PCP.    Hypokalemia:  Resolved, currently potassium 3.6 =>4.5  Hyponatremia, unknown cause, Na 130 on 4/4 Serum osmolality 303 Follow nephrologist Will continue to monitor   Hyperglycemia  most likely secondary to steroids Continue monitor BMP daily   DVT prophylaxis: lovenox Code Status: full  Family Communication: Patient seen at bedside.  All questions answered  Disposition Plan: Anticipate return to previous home environment.  At this time patient is not medically stable.  Current barriers to discharge include acute kidney injury on chronic kidney disease.  Patient status post renal biopsy, waiting for report and started on high-dose prednisone Will need definitive diagnosis and stabilization of creatinine prior to consideration of discharge.   Consultants:  Cardio nephro   Procedures: 4/1 s/p left renal biopsy   Antimicrobials:    Subjective: Patient seen and examined Feels well, no complaints, walking around well.  Family at bedside  Objective: Vitals:   03/25/20 2216 03/26/20 0035 03/26/20 0628 03/26/20 0746  BP: (!) 155/74 138/72 (!) 158/81 (!) 155/72  Pulse: 69 74 75 67  Resp: _0 Temp: 98.1 F (36.7 C) 97.7 F (36.5 C)  97.9 F (36.6 C)  TempSrc: Oral Oral  Oral  SpO2: 95% 95%  94%  Weight:      Height:        Intake/Output Summary (Last 24 hours) at 03/26/2020 1353 Last data filed at 03/26/2020 0854 Gross per 24 hour  Intake 468 ml  Output --  Net 468 ml   Filed Weights   03/16/20 1520 03/16/20 1709  Weight: 68 kg 72 kg    Examination:  General exam: Appears calm and comfortable  Respiratory system: diminished breath sounds b/l/. No rhonchi Cardiovascular system: S1 & S2 +. No  rubs, gallops or clicks.  Gastrointestinal system: Abdomen is nondistended, soft and nontender. Normal bowel sounds heard. Central nervous system: Alert and oriented. Moves all 4 extremities  Psychiatry: Judgement and insight appear normal. Normal mood and affect    Data Reviewed: I have personally reviewed following labs and imaging studies  CBC: Recent Labs  Lab 03/22/20 0517 03/22/20 1147 03/23/20 0433 03/24/20 0439 03/26/20 0430  WBC  10.8* 12.6* 11.5* 8.6 14.0*  NEUTROABS  --  8.5*  --   --   --   HGB 8.7* 9.2* 8.8* 8.5* 8.8*  HCT 24.9* 27.0* 24.9* 25.3* 25.6*  MCV 81.6 83.3 82.5 84.9 83.9  PLT 384 436* 377 341 588*   Basic Metabolic Panel: Recent Labs  Lab 03/21/20 0551 03/22/20 0517 03/23/20 0433 03/24/20 0439 03/26/20 0430  NA 125* 129* 128* 129* 130*  K 3.3* 3.5 3.4* 3.6 4.5  CL 86* 91* 90* 92* 94*  CO2 _1 GLUCOSE 95 106* 105* 190* 176*  BUN 54* 58* 54* 57* 89*  CREATININE 3.18* 3.24* 2.91* 3.09* 3.47*  CALCIUM 8.6* 8.6* 8.6* 8.7* 9.0   GFR: Estimated Creatinine Clearance: 11.7 mL/min (A) (by C-G formula based on SCr of 3.47 mg/dL (H)). Liver Function Tests: No results for input(s): AST, ALT, ALKPHOS, BILITOT, PROT, ALBUMIN in the last 168 hours. No results for input(s): LIPASE, AMYLASE in the last 168 hours. No results for input(s): AMMONIA in the last 168 hours. Coagulation Profile: Recent Labs  Lab 03/22/20 1147  INR 1.1   Cardiac Enzymes: No results  for input(s): CKTOTAL, CKMB, CKMBINDEX, TROPONINI in the last 168 hours. BNP (last 3 results) No results for input(s): PROBNP in the last 8760 hours. HbA1C: No results for input(s): HGBA1C in the last 72 hours. CBG: No results for input(s): GLUCAP in the last 168 hours. Lipid Profile: No results for input(s): CHOL, HDL, LDLCALC, TRIG, CHOLHDL, LDLDIRECT in the last 72 hours. Thyroid Function Tests: No results for input(s): TSH, T4TOTAL, FREET4, T3FREE, THYROIDAB in the last 72 hours. Anemia Panel: No results for input(s): VITAMINB12, FOLATE, FERRITIN, TIBC, IRON, RETICCTPCT in the last 72 hours. Sepsis Labs: No results for input(s): PROCALCITON, LATICACIDVEN in the last 168 hours.  Recent Results (from the past 240 hour(s))  Urine Culture     Status: Abnormal   Collection Time: 03/16/20  5:49 PM   Specimen: Urine, Random  Result Value Ref Range Status   Specimen Description   Final    URINE, RANDOM Performed at [facility], [address] [place], [place] [address]    Special Requests   Final    NONE Performed at [facility], [address], [place], [place] [address]    Culture (A)  Final    <10,000 COLONIES/mL INSIGNIFICANT GROWTH Performed at [facility] [address], [place], [place] [address]    Report Status 03/17/2020 FINAL  Final  SARS CORONAVIRUS 2 (TAT 6-24 HRS) Nasopharyngeal Nasopharyngeal Swab     Status: None   Collection Time: 03/16/20  6:00 PM   Specimen: Nasopharyngeal Swab  Result Value Ref Range Status   SARS Coronavirus 2 NEGATIVE NEGATIVE Final    Comment: (NOTE) SARS-CoV-2 target nucleic acids are NOT DETECTED. The SARS-CoV-2 RNA is generally detectable in upper and lower respiratory specimens during the acute phase of infection. Negative results do not preclude SARS-CoV-2 infection, do not rule out co-infections with other pathogens, and should not be used as the sole basis for treatment or other patient management decisions. Negative results must be combined with clinical observations, patient history, and epidemiological information. The expected result is Negative. Fact Sheet for Patients: SugarRoll.be Fact Sheet for Healthcare Providers: https://www.woods-mathews.com/ This test is not yet approved or cleared by the [place] FDA and  has been authorized for detection and/or diagnosis of SARS-CoV-2 by FDA under an Emergency Use Authorization (EUA). This EUA will remain  in effect (meaning this test can be used) for the duration of the COVID-19 declaration under Section 56 4(b)(1) of the Act, 21 U.S.C. section 360bbb-3(b)(1), unless the authorization is terminated or revoked sooner. Performed at [facility], [place] [address], [place], [place] [address]          Radiology Studies: No results found.      Scheduled Meds: . atenolol  25 mg Oral BID  . bisacodyl  5 mg Oral  Daily  . clonazePAM  0.25 mg Oral BID  . cloNIDine  0.1 mg Oral BID  . cyanocobalamin  1,000 mcg Intramuscular Daily  . docusate sodium  200 mg Oral BID  . heparin injection (subcutaneous)  5,000 Units Subcutaneous Q8H  . hydrALAZINE  50 mg Oral BID  . [START ON 03/27/2020] predniSONE  70 mg Oral Q breakfast  . scopolamine  1 patch Transdermal Q72H  . sodium chloride flush  3 mL Intravenous Once  . sodium chloride flush  3 mL Intravenous Q12H  . valACYclovir  500 mg Oral Daily   Continuous Infusions:    LOS: 10 days    Time spent:  6 mins    Val Riles, MD [facility] Pager [phone]  If 7PM-7AM, please contact night-coverage [email or web address] 03/26/2020, 1:53 PM  '

## 2020-03-27 DIAGNOSIS — I509 Heart failure, unspecified: Secondary | ICD-10-CM

## 2020-03-27 DIAGNOSIS — N009 Acute nephritic syndrome with unspecified morphologic changes: Secondary | ICD-10-CM

## 2020-03-27 DIAGNOSIS — N059 Unspecified nephritic syndrome with unspecified morphologic changes: Secondary | ICD-10-CM

## 2020-03-27 LAB — BASIC METABOLIC PANEL
Anion gap: 10 (ref 5–15)
BUN: 90 mg/dL — ABNORMAL HIGH (ref 8–23)
CO2: 25 mmol/L (ref 22–32)
Calcium: 8.8 mg/dL — ABNORMAL LOW (ref 8.9–10.3)
Chloride: 97 mmol/L — ABNORMAL LOW (ref 98–111)
Creatinine, Ser: 3.31 mg/dL — ABNORMAL HIGH (ref 0.44–1.00)
GFR calc Af Amer: 15 mL/min — ABNORMAL LOW (ref >60)
GFR calc non Af Amer: 13 mL/min — ABNORMAL LOW (ref >60)
Glucose, Bld: 113 mg/dL — ABNORMAL HIGH (ref 70–99)
Potassium: 3.5 mmol/L (ref 3.5–5.1)
Sodium: 132 mmol/L — ABNORMAL LOW (ref 135–145)

## 2020-03-27 LAB — CBC
HCT: 27.2 % — ABNORMAL LOW (ref 36.0–46.0)
Hemoglobin: 9.3 g/dL — ABNORMAL LOW (ref 12.0–15.0)
MCH: 28.4 pg (ref 26.0–34.0)
MCHC: 34.2 g/dL (ref 30.0–36.0)
MCV: 83.2 fL (ref 80.0–100.0)
Platelets: 468 10*3/uL — ABNORMAL HIGH (ref 150–400)
RBC: 3.27 MIL/uL — ABNORMAL LOW (ref 3.87–5.11)
RDW: 13.6 % (ref 11.5–15.5)
WBC: 16.5 10*3/uL — ABNORMAL HIGH (ref 4.0–10.5)
nRBC: 0 % (ref 0.0–0.2)

## 2020-03-27 MED ORDER — METHYLPREDNISOLONE SODIUM SUCC 125 MG IJ SOLR: 125.0000 mg | Freq: Once | INTRAMUSCULAR | Status: DC | PRN

## 2020-03-27 MED ORDER — ACETAMINOPHEN 325 MG PO TABS: 650.0000 mg | ORAL_TABLET | Freq: Once | ORAL | Status: DC

## 2020-03-27 MED ORDER — FAMOTIDINE IN NACL 20-0.9 MG/50ML-% IV SOLN: 20.0000 mg | Freq: Once | INTRAVENOUS | Status: DC | PRN

## 2020-03-27 MED ORDER — DIPHENHYDRAMINE HCL 50 MG/ML IJ SOLN: 50.0000 mg | Freq: Once | INTRAMUSCULAR | Status: DC | PRN

## 2020-03-27 MED ORDER — SODIUM CHLORIDE 0.9 % IV SOLN: 1000.0000 mg | Freq: Once | INTRAVENOUS | Status: DC

## 2020-03-27 MED ORDER — EPINEPHRINE PF 1 MG/ML IJ SOLN: 0.3000 mg | INTRAMUSCULAR | Status: DC | PRN

## 2020-03-27 MED ORDER — DIPHENHYDRAMINE HCL 25 MG PO CAPS: 50.0000 mg | ORAL_CAPSULE | Freq: Once | ORAL | Status: DC

## 2020-03-27 MED ORDER — ALBUTEROL SULFATE (2.5 MG/3ML) 0.083% IN NEBU: 2.5000 mg | INHALATION_SOLUTION | Freq: Once | RESPIRATORY_TRACT | Status: DC | PRN

## 2020-03-27 MED ORDER — SODIUM CHLORIDE 0.9 % IV BOLUS: 1000.0000 mL | Freq: Once | INTRAVENOUS | Status: DC | PRN

## 2020-03-27 NOTE — Care Management Important Message (Signed)
Important Message  Patient Details  Name: Linda Bradley MRN: [id number] Date of Birth: 06-03-1941   Medicare Important Message Given:  Yes     [name] 03/27/2020, 10:55 AM

## 2020-03-27 NOTE — Progress Notes (Addendum)
[facility] NAME: Linda Bradley    MR#:  [id number]  DATE OF BIRTH:  24-Aug-1941  SUBJECTIVE:   No new complaints. Feels better with sob. Son in the room REVIEW OF SYSTEMS:   Review of Systems  Constitutional: Negative for chills, fever and weight loss.  HENT: Negative for ear discharge, ear pain and nosebleeds.   Eyes: Negative for blurred vision, pain and discharge.  Respiratory: Negative for sputum production, shortness of breath, wheezing and stridor.   Cardiovascular: Negative for chest pain, palpitations, orthopnea and PND.  Gastrointestinal: Negative for abdominal pain, diarrhea, nausea and vomiting.  Genitourinary: Negative for frequency and urgency.  Musculoskeletal: Negative for back pain and joint pain.  Neurological: Positive for weakness. Negative for sensory change, speech change and focal weakness.  Psychiatric/Behavioral: Negative for depression and hallucinations. The patient is not nervous/anxious.    Tolerating Diet:yes Tolerating PT: no PT needs  DRUG ALLERGIES:   Allergies  Allergen Reactions  . Amlodipine Swelling  . Aspirin   . Chlorthalidone Diarrhea  . Coreg [Carvedilol] Other (See Comments)    Patient states it makes her sick. She refuses.  . Statins     Other reaction(s): Other (See Comments) Other Reaction: CNS DISORDER    VITALS:  Blood pressure (!) 160/66, pulse 71, temperature 98.3 F (36.8 C), temperature source Oral, resp. rate 16, height 4\' 11"  (1.499 m), weight 69.6 kg, SpO2 98 %.  PHYSICAL EXAMINATION:   Physical Exam  GENERAL:  79 y.o.-year-old patient lying in the bed with no acute distress.  EYES: Pupils equal, round, reactive to light and accommodation. No scleral icterus.   HEENT: Head atraumatic, normocephalic. Oropharynx and nasopharynx clear.  NECK:  Supple, no jugular venous distention. No thyroid enlargement, no tenderness.  LUNGS: Normal breath sounds bilaterally, no  wheezing, rales, rhonchi. No use of accessory muscles of respiration.  CARDIOVASCULAR: S1, S2 normal. No murmurs, rubs, or gallops.  ABDOMEN: Soft, nontender, nondistended. Bowel sounds present. No organomegaly or mass.  EXTREMITIES: No cyanosis, clubbing  + edema b/l.    NEUROLOGIC: Cranial nerves II through XII are intact. No focal Motor or sensory deficits b/l.   PSYCHIATRIC:  patient is alert and oriented x 3.  SKIN: No obvious rash, lesion, or ulcer.   LABORATORY PANEL:  CBC Recent Labs  Lab 03/27/20 0339  WBC 16.5*  HGB 9.3*  HCT 27.2*  PLT 468*    Chemistries  Recent Labs  Lab 03/27/20 0339  NA 132*  K 3.5  CL 97*  CO2 25  GLUCOSE 113*  BUN 90*  CREATININE 3.31*  CALCIUM 8.8*   Cardiac Enzymes No results for input(s): TROPONINI in the last 168 hours. RADIOLOGY:  No results found. ASSESSMENT AND PLAN:  79 y.o. female with past medical history of hypertension, aortic stenosis, hyperlipidemia, chronic kidney disease stage III a baseline creatinine 1.1 who presented to [facility] with hypertensive urgency, pulmonary edema, shortness of breath and found to have worsening acute kidney injury.  AKI on CKDIIIb:  Creatinine worsening over interval 03/23/20 s/p Left Renal Biopsy-- per Dr. 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 daily as per nephrologist 4/5 started IV rituximab--oncology consulted Holding home ACE inhibitor Daily renal function  Avoid nephrotoxins Follow nephrology recommendations  HTNurgency  w/ recent hx of uncontrolled HTN.  Continue clonidine 0.1 mg po  BID PTA dose Continue hydralazine 50 mg po BID PTA dose IV hydralazine as needed 4/4 increased atenolol 25 mg p.o. daily renal dose (At home patient was taking atenolol 50 twice daily) ACE inhibitor on hold  Dyspnea suspected due to  acute diastolic  CHF Much improved Currently on room air Speaking in complete sentences without increased work of breathing Echo shows EF 98-33%, grade I diastolic dysfunction, & moderate to severe aortic stenosis Cardiology was following, signed off Continue to hold lasix secondary to AKI or CKD.  Monitor I/Os.   Acute hypoxic respiratory failure, resolved secondary to above. Weaned off of supplemental oxygen   anemia of chronic disease  etiology unclear, possibly secondary to CKD.  No need for a transfusion at this time.  Will continue on iron supplements as pt's iron is low.   transfuse as needed  Vitamin B12 deficiency:  Vitamin B12 1000 mcg IM injection daily for 7 days, can be transitioned to oral vitamin D supplement on discharge.  Repeat vitamin B12 level after 3 months and follow with PCP.    Hypokalemia:  Resolved, currently potassium 3.6 =>4.5   Hyperglycemia most likely secondary to steroids Continue monitor BMP daily   DVT prophylaxis: lovenox Code Status: full  Family Communication: Patient seen at bedside.  All questions answered . son at bedside Disposition Plan: Anticipate return to previous home environment.  At this time patient is not medically stable.  Current barriers to discharge include acute kidney injury on chronic kidney disease.  Patient status post renal biopsy, waiting for report and started on IV rituximab Anticipate discharge and 1-3 days pending oncology consult tolerance to IV rituximab   TOTAL TIME TAKING CARE OF THIS PATIENT: *40* minutes.  >50% time spent on counselling and coordination of care  Note: This dictation was prepared with Dragon dictation along with smaller phrase technology. Any transcriptional errors that result from this process are unintentional.  Fritzi Mandes M.D    [facility]   CC: Primary care physician; Lynnell Jude, MDPatient ID: Linda Bradley, female   DOB: 03-09-1941, 79 y.o.   MRN: [id number]

## 2020-03-27 NOTE — Plan of Care (Signed)

## 2020-03-27 NOTE — Consult Note (Signed)
PHARMACY BRIEF NOTE  Pharmacy has been consulted for Rituxan dosing. Patient presented with AKI (CKD-III) w/ proteinuria and hematuria  . 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 Premedications have been ordered in preparation for Rituxan administration.    Per Dr. 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 This pharmacist reached out to the oncology clinical pharmacist, Darlin Drop, about the appropriate dose and he d/w Dr. Juleen China the use of ruxience (biosimilar of rituxan). The medication will be administered tomorrow.   Thank you for allowing pharmacy to be a part of this patient's care.   Kristeen Miss, PharmD Clinical Pharmacist

## 2020-03-27 NOTE — Progress Notes (Addendum)
[facility]  ROUNDING NOTE   Subjective:   Daughter at bedside.  Patient has many questions about her disease.   Objective:  Vital signs in last 24 hours:  Temp:  [97.5 F (36.4 C)-98.6 F (37 C)] 98.3 F (36.8 C) (04/05 0751) Pulse Rate:  [70-72] 71 (04/05 0751) Resp:  [16-17] 16 (04/05 0008) BP: (119-167)/(66-69) 160/66 (04/05 0751) SpO2:  [96 %-98 %] 98 % (04/05 0751) Weight:  [69.6 kg] 69.6 kg (04/05 0500)  Weight change:  Filed Weights   03/16/20 1520 03/16/20 1709 03/27/20 0500  Weight: 68 kg 72 kg 69.6 kg    Intake/Output: I/O last 3 completed shifts: In: 363 [P.O.:240; I.V.:6; IV Piggyback:117] Out: -    Intake/Output this shift:  Total I/O In: 480 [P.O.:480] Out: -   Physical Exam: General: No acute distress  Head: +oral lesions   Eyes: Anicteric  Neck: Supple, trachea midline  Lungs:  Clear to auscultation, normal effort  Heart: regular  Abdomen:  Soft, nontender, bowel sounds present  Extremities: No peripheral edema.  Neurologic: Awake, alert, following commands  Skin: Mild mallar rash on the face        Basic Metabolic Panel: Recent Labs  Lab 03/22/20 0517 03/22/20 0517 03/23/20 0433 03/23/20 0433 03/24/20 0439 03/26/20 0430 03/27/20 0339  NA 129*  --  128*  --  129* 130* 132*  K 3.5  --  3.4*  --  3.6 4.5 3.5  CL 91*  --  90*  --  92* 94* 97*  CO2 27  --  26  --  24 24 25   GLUCOSE 106*  --  105*  --  190* 176* 113*  BUN 58*  --  54*  --  57* 89* 90*  CREATININE 3.24*  --  2.91*  --  3.09* 3.47* 3.31*  CALCIUM 8.6*   < > 8.6*   < > 8.7* 9.0 8.8*   < > = values in this interval not displayed.    Liver Function Tests: No results for input(s): AST, ALT, ALKPHOS, BILITOT, PROT, ALBUMIN in the last 168 hours. No results for input(s): LIPASE, AMYLASE in the last 168 hours. No results for input(s): AMMONIA in the last 168 hours.  CBC: Recent Labs  Lab 03/22/20 1147 03/23/20 0433 03/24/20 0439 03/26/20 0430  03/27/20 0339  WBC 12.6* 11.5* 8.6 14.0* 16.5*  NEUTROABS 8.5*  --   --   --   --   HGB 9.2* 8.8* 8.5* 8.8* 9.3*  HCT 27.0* 24.9* 25.3* 25.6* 27.2*  MCV 83.3 82.5 84.9 83.9 83.2  PLT 436* 377 341 441* 468*    Cardiac Enzymes: No results for input(s): CKTOTAL, CKMB, CKMBINDEX, TROPONINI in the last 168 hours.  BNP: Invalid input(s): POCBNP  CBG: No results for input(s): GLUCAP in the last 168 hours.  Microbiology: Results for orders placed or performed during the hospital encounter of 03/16/20  Urine Culture     Status: Abnormal   Collection Time: 03/16/20  5:49 PM   Specimen: Urine, Random  Result Value Ref Range Status   Specimen Description   Final    URINE, RANDOM Performed at [facility], [address], [place], [place] [address]    Special Requests   Final    NONE Performed at [facility], [address] [place], [place] [address]    Culture (A)  Final    <10,000 COLONIES/mL INSIGNIFICANT GROWTH Performed at [facility] [address] [place], [place]  [address]    Report Status 03/17/2020 FINAL  Final  SARS CORONAVIRUS 2 (TAT 6-24 HRS) Nasopharyngeal Nasopharyngeal Swab     Status: None   Collection Time: 03/16/20  6:00 PM   Specimen: Nasopharyngeal Swab  Result Value Ref Range Status   SARS Coronavirus 2 NEGATIVE NEGATIVE Final    Comment: (NOTE) SARS-CoV-2 target nucleic acids are NOT DETECTED. The SARS-CoV-2 RNA is generally detectable in upper and lower respiratory specimens during the acute phase of infection. Negative results do not preclude SARS-CoV-2 infection, do not rule out co-infections with other pathogens, and should not be used as the sole basis for treatment or other patient management decisions. Negative results must be combined with clinical observations, patient history, and epidemiological information. The expected result is Negative. Fact Sheet for Patients: SugarRoll.be Fact  Sheet for Healthcare Providers: https://www.woods-mathews.com/ This test is not yet approved or cleared by the [place] FDA and  has been authorized for detection and/or diagnosis of SARS-CoV-2 by FDA under an Emergency Use Authorization (EUA). This EUA will remain  in effect (meaning this test can be used) for the duration of the COVID-19 declaration under Section 56 4(b)(1) of the Act, 21 U.S.C. section 360bbb-3(b)(1), unless the authorization is terminated or revoked sooner. Performed at [facility], [place] [address] [place], [place] [address]     Coagulation Studies: No results for input(s): LABPROT, INR in the last 72 hours.  Urinalysis: No results for input(s): COLORURINE, LABSPEC, PHURINE, GLUCOSEU, HGBUR, BILIRUBINUR, KETONESUR, PROTEINUR, UROBILINOGEN, NITRITE, LEUKOCYTESUR in the last 72 hours.  Invalid input(s): APPERANCEUR    Imaging: No results found.   Medications:    . atenolol  25 mg Oral Daily  . bisacodyl  5 mg Oral Daily  . clonazePAM  0.25 mg Oral BID  . cloNIDine  0.1 mg Oral BID  . cyanocobalamin  1,000 mcg Intramuscular Daily  . docusate sodium  200 mg Oral BID  . feeding supplement (NEPRO CARB STEADY)  237 mL Oral Q24H  . heparin injection (subcutaneous)  5,000 Units Subcutaneous Q8H  . hydrALAZINE  50 mg Oral BID  . polyethylene glycol  17 g Oral Daily  . predniSONE  70 mg Oral Q breakfast  . scopolamine  1 patch Transdermal Q72H  . sodium chloride flush  3 mL Intravenous Once  . sodium chloride flush  3 mL Intravenous Q12H  . valACYclovir  500 mg Oral Daily   acetaminophen **OR** acetaminophen, hydrALAZINE, magic mouthwash **AND** lidocaine, magnesium hydroxide, ondansetron (ZOFRAN) IV, ondansetron, traMADol  Assessment/ Plan:   [name] is a 79 y.o. white female with hypertension, aortic stenosis, hyperlipidemia who is admitted to [facility] on 03/16/2020 for Acute pulmonary edema (HCC) [J81.0] Dyspnea  [R06.00] AKI (acute kidney injury) ([place]) [N17.9] Acute congestive heart failure, unspecified heart failure type ([place]) [I50.9]   1.  Acute kidney injury with proteinuria and hematuria on chronic kidney disease stage IIIA: baseline creatinine of 1.1, GFR of 49 on 06/21/2019.  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 Pending renal biopsy report.  BUN is elevated due to high dose steroids.  Negative for anti-GBM.  Nonnephrotic range proteinuria (Nephritic Syndrome) No indication for dialysis at this time.  - Completed pulse steroids x three days.  - prednisone 70mg  PO daily  - Pending biopsy results. 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 Treatment would then be rituximab or cyclophosphamide.  - holding lisinopril  2. Hypertension: 160/66, elevated - steroid driven. Home regimen of carvedilol, hydralazine, clonidine and lisinopril. Patient states that  she cannot tolerate carvedilol Currently on clonidine, atenolol, hydralazine.  - holding lisinopril  3. Hyponatremia: with renal failure. Monitor volume status  4. Anemia with renal failure: hemoglobin 9.3. normocytic.   LOS: doctor with. 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  Will started rituximab and consult oncology.  Kaevon Cotta

## 2020-03-27 NOTE — Progress Notes (Addendum)
PT Cancellation Note  Patient Details Name: Linda Bradley MRN: [id number] DOB: 1941-01-10   Cancelled Treatment:    Reason Eval/Treat Not Completed: Other (comment)   Pt in bed.  Daughter in room.  Stated she has had a laxative today and  Is generally fatigued.  Pt and daughter report overall comfort with mobility with HHA in room and feel mobility is not a barrier for discharge.  Declined session at this time.    Chesley Noon 03/27/2020, 3:40 PM

## 2020-03-28 ENCOUNTER — Encounter: Payer: Self-pay | Admitting: Internal Medicine

## 2020-03-28 ENCOUNTER — Inpatient Hospital Stay: Payer: Medicare HMO | Attending: Oncology

## 2020-03-28 ENCOUNTER — Other Ambulatory Visit: Payer: Self-pay | Admitting: Oncology

## 2020-03-28 VITALS — BP 121/70 | HR 81

## 2020-03-28 DIAGNOSIS — N184 Chronic kidney disease, stage 4 (severe): Secondary | ICD-10-CM | POA: Insufficient documentation

## 2020-03-28 DIAGNOSIS — N017 Rapidly progressive nephritic syndrome with diffuse crescentic glomerulonephritis: Secondary | ICD-10-CM | POA: Insufficient documentation

## 2020-03-28 DIAGNOSIS — M3130 Wegener's granulomatosis without renal involvement: Secondary | ICD-10-CM

## 2020-03-28 DIAGNOSIS — N058 Unspecified nephritic syndrome with other morphologic changes: Secondary | ICD-10-CM

## 2020-03-28 DIAGNOSIS — N009 Acute nephritic syndrome with unspecified morphologic changes: Secondary | ICD-10-CM

## 2020-03-28 HISTORY — DX: Wegener's granulomatosis without renal involvement: M31.30

## 2020-03-28 LAB — BASIC METABOLIC PANEL
Anion gap: 10 (ref 5–15)
BUN: 84 mg/dL — ABNORMAL HIGH (ref 8–23)
CO2: 26 mmol/L (ref 22–32)
Calcium: 9 mg/dL (ref 8.9–10.3)
Chloride: 99 mmol/L (ref 98–111)
Creatinine, Ser: 2.98 mg/dL — ABNORMAL HIGH (ref 0.44–1.00)
GFR calc Af Amer: 17 mL/min — ABNORMAL LOW (ref >60)
GFR calc non Af Amer: 15 mL/min — ABNORMAL LOW (ref >60)
Glucose, Bld: 115 mg/dL — ABNORMAL HIGH (ref 70–99)
Potassium: 4.3 mmol/L (ref 3.5–5.1)
Sodium: 135 mmol/L (ref 135–145)

## 2020-03-28 MED ORDER — PREDNISONE 10 MG PO TABS: 70.0000 mg | ORAL_TABLET | Freq: Every day | ORAL | 0 refills | Status: DC

## 2020-03-28 MED ORDER — SODIUM CHLORIDE 0.9 % IV SOLN
Freq: Once | INTRAVENOUS | Status: AC
  Filled 2020-03-28: qty 250

## 2020-03-28 MED ORDER — POLYSACCHARIDE IRON COMPLEX 150 MG PO CAPS: 150.0000 mg | ORAL_CAPSULE | Freq: Every day | ORAL | 0 refills | Status: DC

## 2020-03-28 MED ORDER — ACETAMINOPHEN 325 MG PO TABS: 650.0000 mg | ORAL_TABLET | Freq: Once | ORAL | Status: DC

## 2020-03-28 MED ORDER — POLYSACCHARIDE IRON COMPLEX 150 MG PO CAPS
150.0000 mg | ORAL_CAPSULE | Freq: Every day | ORAL | Status: DC
  Filled 2020-03-28: qty 1

## 2020-03-28 MED ORDER — VITAMIN B-12 1000 MCG PO TABS: 1000.0000 ug | ORAL_TABLET | Freq: Every day | ORAL | Status: DC

## 2020-03-28 MED ORDER — HYDRALAZINE HCL 50 MG PO TABS: 50.0000 mg | ORAL_TABLET | Freq: Two times a day (BID) | ORAL | 0 refills | Status: DC

## 2020-03-28 MED ORDER — NEPRO/CARBSTEADY PO LIQD: 237.0000 mL | ORAL | 0 refills | Status: DC

## 2020-03-28 MED ORDER — DIPHENHYDRAMINE HCL 50 MG/ML IJ SOLN
25.0000 mg | Freq: Once | INTRAMUSCULAR | Status: AC
  Administered 2020-03-28: 25 mg via INTRAVENOUS
  Filled 2020-03-28: qty 1

## 2020-03-28 MED ORDER — ACETAMINOPHEN 325 MG PO TABS
650.0000 mg | ORAL_TABLET | Freq: Once | ORAL | Status: AC
  Administered 2020-03-28: 650 mg via ORAL
  Filled 2020-03-28: qty 2

## 2020-03-28 MED ORDER — SODIUM CHLORIDE 0.9 % IV SOLN
1000.0000 mg | Freq: Once | INTRAVENOUS | Status: AC
  Administered 2020-03-28: 1000 mg via INTRAVENOUS
  Filled 2020-03-28: qty 100

## 2020-03-28 MED ORDER — ATENOLOL 25 MG PO TABS: 25.0000 mg | ORAL_TABLET | Freq: Every day | ORAL | 0 refills | Status: DC

## 2020-03-28 MED ORDER — METHYLPREDNISOLONE SODIUM SUCC 125 MG IJ SOLR
100.0000 mg | Freq: Once | INTRAMUSCULAR | Status: AC
  Administered 2020-03-28: 100 mg via INTRAVENOUS
  Filled 2020-03-28: qty 2

## 2020-03-28 MED ORDER — CYANOCOBALAMIN 1000 MCG PO TABS: 1000.0000 ug | ORAL_TABLET | Freq: Every day | ORAL | 0 refills | Status: DC

## 2020-03-28 NOTE — Progress Notes (Signed)
OT Cancellation Note  Patient Details Name: Linda Bradley MRN: [id number] DOB: [birth year]   Cancelled Treatment:    Reason Eval/Treat Not Completed: Patient at procedure or test/ unavailable. OT attempted to see pt for tx session. Upon arrival to pt room, pt noted to be off the unit. Pt caregiver at bedside states pt currently out for chemo treatment. Will hold OT tx and re-attempt as available/pt medically appropriate for OT tx.    Shara Blazing, M.S., OTR/L Ascom: [phone] 03/28/20, 11:40 AM

## 2020-03-28 NOTE — Consult Note (Signed)
[place]  [phone] [phone]  ID: [name] OB: Mar 30, 1941  MR#: [id number]  ZHG#:[id number]  Patient Care Team: Lynnell Jude, MD as PCP - General (Family Medicine)  CHIEF COMPLAINT: Pauci-immune glomerulonephritis.  INTERVAL HISTORY: Patient is a 79 year old female who was recently admitted to the hospital with acute pulmonary edema and acute kidney injury.  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  Patient was seen in the cancer center today for her first Rituxan infusion.  She has increased weakness and fatigue, but otherwise feels well.  She had no neurologic complaints.  She denies any recent fevers or illnesses.  She has a good appetite and denies weight loss.  Her shortness of breath has improved and she denies any chest pain, cough, or hemoptysis.  She has no nausea, vomiting, constipation, or diarrhea.  She has no urinary complaints.  Patient offers no further specific complaints today.  REVIEW OF SYSTEMS:   Review of Systems  Constitutional: Positive for malaise/fatigue. Negative for fever and weight loss.  Respiratory: Positive for shortness of breath. Negative for cough and hemoptysis.   Cardiovascular: Negative.  Negative for chest pain and leg swelling.  Gastrointestinal: Negative.  Negative for abdominal pain.  Genitourinary: Negative.  Negative for dysuria.  Musculoskeletal: Negative.  Negative for back pain.  Skin: Negative.  Negative for rash.  Neurological: Positive for weakness. Negative for dizziness, focal weakness and headaches.  Psychiatric/Behavioral: Negative.  The patient is not nervous/anxious.     As per HPI. Otherwise, a complete review of systems is negative.  PAST MEDICAL HISTORY: Past Medical History:  Diagnosis Date  . Anxiety disorder   . Dermatitis   . 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 Heart murmur   . Hypercholesterolemia   . Hypertension   .  Lymphadenitis   . Skin cancer   . Spontaneous ecchymoses   . Vitamin D deficiency     PAST SURGICAL HISTORY: Past Surgical History:  Procedure Laterality Date  . CARPAL TUNNEL RELEASE     [facility]  . TONSILLECTOMY    . TRANSTHORACIC ECHOCARDIOGRAM  05/01/2015    EF 60-65%. Abnormal relaxation. Mild aortic stenosis (peak gradient 19 mmHg).    FAMILY HISTORY: Family History  Problem Relation Age of Onset  . Heart disease Father   . Heart failure Father     ADVANCED DIRECTIVES (Y/N):  @ADVDIR @  HEALTH MAINTENANCE: Social History   Tobacco Use  . Smoking status: Never Smoker  . Smokeless tobacco: Never Used  Substance Use Topics  . Alcohol use: No  . Drug use: No     Colonoscopy:  PAP:  Bone density:  Lipid panel:  Allergies  Allergen Reactions  . Amlodipine Swelling  . Aspirin   . Chlorthalidone Diarrhea  . Coreg [Carvedilol] Other (See Comments)    Patient states it makes her sick. She refuses.  . Statins     Other reaction(s): Other (See Comments) Other Reaction: CNS DISORDER    Current Facility-Administered Medications  Medication Dose Route Frequency Provider Last Rate Last Admin  . acetaminophen (TYLENOL) tablet 650 mg  650 mg Oral Q6H PRN Sharion Settler, NP   650 mg at 03/21/20 1516   Or  . acetaminophen (TYLENOL) suppository 650 mg  650 mg Rectal Q6H PRN Sharion Settler, NP      . acetaminophen (TYLENOL) tablet 650 mg  650 mg Oral Once Shanlever, Pierce Crane, RPH      . albuterol (PROVENTIL) (2.5 MG/3ML) 0.083% nebulizer solution  2.5 mg  2.5 mg Nebulization Once PRN Kolluru, Sarath, MD      . atenolol (TENORMIN) tablet 25 mg  25 mg Oral Daily Val Riles, MD   25 mg at 03/28/20 0820  . bisacodyl (DULCOLAX) EC tablet 5 mg  5 mg Oral Daily Wyvonnia Dusky, MD   5 mg at 03/26/20 0177  . clonazePAM (KLONOPIN) tablet 0.25 mg  0.25 mg Oral BID Sharion Settler, NP   0.25 mg at 03/28/20 0820  . cloNIDine (CATAPRES) tablet 0.1 mg  0.1 mg Oral BID Val Riles, MD   0.1 mg at 03/28/20 0836  . cyanocobalamin ((VITAMIN B-12)) injection 1,000 mcg  1,000 mcg Intramuscular Daily Val Riles, MD   1,000 mcg at 03/28/20 0837  . diphenhydrAMINE (BENADRYL) capsule 50 mg  50 mg Oral Once Kolluru, Sarath, MD      . diphenhydrAMINE (BENADRYL) injection 50 mg  50 mg Intravenous Once PRN Kolluru, Sarath, MD      . docusate sodium (COLACE) capsule 200 mg  200 mg Oral BID Wyvonnia Dusky, MD   200 mg at 03/26/20 2058  . EPINEPHrine (ADRENALIN) 0.3 mg  0.3 mg Intramuscular Q5 min PRN Kolluru, Sarath, MD      . famotidine (PEPCID) IVPB 20 mg premix  20 mg Intravenous Once PRN Kolluru, Sarath, MD      . feeding supplement (NEPRO CARB STEADY) liquid 237 mL  237 mL Oral Q24H Val Riles, MD   237 mL at 03/27/20 1407  . heparin injection 5,000 Units  5,000 Units Subcutaneous Q8H Rowland Lathe, RPH   5,000 Units at 03/28/20 0538  . hydrALAZINE (APRESOLINE) injection 20 mg  20 mg Intravenous Q6H PRN Sharion Settler, NP      . hydrALAZINE (APRESOLINE) tablet 50 mg  50 mg Oral BID Val Riles, MD   50 mg at 03/28/20 0820  . magic mouthwash  5 mL Oral QID PRN Benita Gutter, RPH   5 mL at 03/26/20 1419   And  . lidocaine (XYLOCAINE) 2 % viscous mouth solution 5 mL  5 mL Mouth/Throat QID PRN Benita Gutter, RPH      . magnesium hydroxide (MILK OF MAGNESIA) suspension 15 mL  15 mL Oral Daily PRN Wyvonnia Dusky, MD   15 mL at 03/19/20 2024  . methylPREDNISolone sodium succinate (SOLU-MEDROL) 125 mg/2 mL injection 125 mg  125 mg Intravenous Once PRN Kolluru, Sarath, MD      . ondansetron (ZOFRAN) injection 4 mg  4 mg Intravenous Q6H PRN Wyvonnia Dusky, MD   4 mg at 03/18/20 1438  . ondansetron (ZOFRAN-ODT) disintegrating tablet 4 mg  4 mg Oral Q8H PRN Wyvonnia Dusky, MD   4 mg at 03/19/20 1344  . polyethylene glycol (MIRALAX / GLYCOLAX) packet 17 g  17 g Oral Daily Ouma, Bing Neighbors, NP      . predniSONE (DELTASONE) tablet 70 mg  70 mg  Oral Q breakfast Kolluru, Sarath, MD   70 mg at 03/28/20 0819  . scopolamine (TRANSDERM-SCOP) 1 MG/3DAYS 1.5 mg  1 patch Transdermal Q72H Wyvonnia Dusky, MD   1.5 mg at 03/26/20 1226  . sodium chloride 0.9 % bolus 1,000 mL  1,000 mL Intravenous Once PRN Kolluru, Sarath, MD      . sodium chloride flush (NS) 0.9 % injection 3 mL  3 mL Intravenous Once Sharion Settler, NP      . sodium chloride flush (NS) 0.9 % injection 3 mL  3 mL Intravenous Q12H Sharion Settler, NP   3 mL at 03/28/20 0824  . traMADol (ULTRAM) tablet 50 mg  50 mg Oral Q6H PRN Sharion Settler, NP      . valACYclovir (VALTREX) tablet 500 mg  500 mg Oral Daily Kolluru, Sarath, MD   500 mg at 03/28/20 0836    OBJECTIVE: Vitals:   03/28/20 0743 03/28/20 0812  BP:  (!) 154/68  Pulse: 79 73  Resp:  16  Temp: 97.7 F (36.5 C) 98.4 F (36.9 C)  SpO2: 98% 98%     Body mass index is 29.39 kg/m.    ECOG FS:1 - Symptomatic but completely ambulatory  General: Well-developed, well-nourished, no acute distress. Eyes: Pink conjunctiva, anicteric sclera. HEENT: Normocephalic, moist mucous membranes. Lungs: No audible wheezing or coughing. Heart: Regular rate and rhythm. Abdomen: Soft, nontender, no obvious distention. Musculoskeletal: No edema, cyanosis, or clubbing. Neuro: Alert, answering all questions appropriately. Cranial nerves grossly intact. Skin: No rashes or petechiae noted. Psych: Normal affect. Lymphatics: No cervical, calvicular, axillary or inguinal LAD.   LAB RESULTS:  Lab Results  Component Value Date   NA 135 03/28/2020   K 4.3 03/28/2020   CL 99 03/28/2020   CO2 26 03/28/2020   GLUCOSE 115 (H) 03/28/2020   BUN 84 (H) 03/28/2020   CREATININE 2.98 (H) 03/28/2020   CALCIUM 9.0 03/28/2020   PROT 5.8 (L) 03/15/2016   ALBUMIN 3.4 (L) 03/15/2016   AST 38 03/15/2016   ALT 30 03/15/2016   ALKPHOS 59 03/15/2016   BILITOT 1.3 (H) 03/15/2016   GFRNONAA 15 (L) 03/28/2020   GFRAA 17 (L) 03/28/2020     Lab Results  Component Value Date   WBC 16.5 (H) 03/27/2020   NEUTROABS 8.5 (H) 03/22/2020   HGB 9.3 (L) 03/27/2020   HCT 27.2 (L) 03/27/2020   MCV 83.2 03/27/2020   PLT 468 (H) 03/27/2020     STUDIES: DG Chest 1 View  Result Date: 03/18/2020 CLINICAL DATA:  Acute tubular necrosis, history of shortness of breath EXAM: CHEST  1 VIEW COMPARISON:  03/16/2020 FINDINGS: Single frontal view of the chest demonstrates a stable cardiac silhouette. There is persistent central vascular congestion and interstitial prominence. New left basilar consolidation and small left effusion. No pneumothorax. IMPRESSION: 1. Progressive pulmonary edema, with new left pleural effusion. Electronically Signed   By: Randa Ngo M.D.   On: 03/18/2020 19:49   DG Chest 2 View  Result Date: 03/16/2020 CLINICAL DATA:  Shortness of breath. EXAM: CHEST - 2 VIEW COMPARISON:  Radiograph 05/01/2017 FINDINGS: Borderline cardiomegaly. Unchanged mediastinal contours. Small bilateral pleural effusions with fluid in the fissures. Interstitial opacities consistent with pulmonary edema with mild Kerley B-lines. No confluent airspace disease. No pneumothorax. Scoliotic curvature in the spine with associated degenerative change. Aortic atherosclerosis. IMPRESSION: Findings consistent with CHF. Pulmonary edema with small pleural effusions. Aortic Atherosclerosis (ICD10-I70.0). Electronically Signed   By: Keith Rake M.D.   On: 03/16/2020 15:59   CT HEAD WO CONTRAST  Result Date: 03/16/2020 CLINICAL DATA:  Shortness of breath, chills and posterior headache. EXAM: CT HEAD WITHOUT CONTRAST TECHNIQUE: Contiguous axial images were obtained from the base of the skull through the vertex without intravenous contrast. COMPARISON:  None. FINDINGS: Brain: There is mild cerebral atrophy with widening of the extra-axial spaces and ventricular dilatation. There are areas of decreased attenuation within the white matter tracts of the  supratentorial brain, consistent with microvascular disease changes. Vascular: No hyperdense vessel or unexpected calcification. Skull: Normal. Negative  for fracture or focal lesion. Sinuses/Orbits: No acute finding. Other: None. IMPRESSION: 1. Generalized cerebral atrophy. 2. No acute intracranial abnormality. Electronically Signed   By: Virgina Norfolk M.D.   On: 03/16/2020 17:46   US RENAL  Result Date: 03/18/2020 CLINICAL DATA:  Acute tubular necrosis EXAM: RENAL / URINARY TRACT ULTRASOUND COMPLETE COMPARISON:  None. FINDINGS: Right Kidney: Renal measurements: 9 x 5.1 x 4.9 cm = volume: 117 mL. There is increased echotexture of right kidney. No focal mass or hydronephrosis is identified. Left Kidney: Renal measurements: 11.1 x 5.1 x 4.2 cm = volume: 126 mL. Echogenicity within normal limits. No mass or hydronephrosis visualized. Bladder: Appears normal for degree of bladder distention. Other: None. IMPRESSION: Increased echotexture of right kidney.  Otherwise negative. Electronically Signed   By: Abelardo Diesel M.D.   On: 03/18/2020 15:54   ECHOCARDIOGRAM COMPLETE  Result Date: 03/17/2020    ECHOCARDIOGRAM REPORT   Patient Name:   MAKAI AGOSTINELLI Date of Exam: 03/17/2020 Medical Rec #:  [id number]       Height:       59.0 in Accession #:    [id number]      Weight:       158.7 lb Date of Birth:  Jul 12, 1941      BSA:          1.672 m Patient Age:    37 years        BP:           120/59 mmHg Patient Gender: F               HR:           98 bpm. Exam Location:  [facility] Procedure: 2D Echo, Color Doppler and Cardiac Doppler Indications:     R06.00 Dyspnea  History:         Patient has prior history of Echocardiogram examinations.                  Signs/Symptoms:Murmur; Risk Factors:Hypertension and HCL.  Sonographer:     Charmayne Sheer RDCS (AE) Referring Phys:  [id number] BRENDA MORRISON Diagnosing Phys: Isaias Cowman MD IMPRESSIONS  1. Left ventricular ejection fraction, by estimation, is 55 to 60%. The left  ventricle has normal function. The left ventricle has no regional wall motion abnormalities. Left ventricular diastolic parameters are consistent with Grade I diastolic dysfunction (impaired relaxation).  2. Right ventricular systolic function is normal. The right ventricular size is normal.  3. The mitral valve is normal in structure. Mild mitral valve regurgitation. No evidence of mitral stenosis.  4. The aortic valve is normal in structure. Aortic valve regurgitation is mild to moderate. 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  5. The inferior vena cava is normal in size with greater than 50% respiratory variability, suggesting right atrial pressure of 3 mmHg. FINDINGS  Left Ventricle: Left ventricular ejection fraction, by estimation, is 55 to 60%. The left ventricle has normal function. The left ventricle has no regional wall motion abnormalities. The left ventricular internal cavity size was normal in size. There is  no left ventricular hypertrophy. Left ventricular diastolic parameters are consistent with Grade I diastolic dysfunction (impaired relaxation). Right Ventricle: The right ventricular size is normal. No increase in right ventricular wall thickness. Right ventricular systolic function is normal. Left Atrium: Left atrial size was normal in size. Right Atrium: Right atrial size was normal in size. Pericardium: There is no evidence of pericardial effusion. Mitral Valve: The mitral valve is normal  in structure. Normal mobility of the mitral valve leaflets. Mild mitral valve regurgitation. No evidence of mitral valve stenosis. MV peak gradient, 7.4 mmHg. The mean mitral valve gradient is 3.0 mmHg. Tricuspid Valve: The tricuspid valve is normal in structure. Tricuspid valve regurgitation is mild . No evidence of tricuspid stenosis. Aortic Valve: The aortic valve is normal in structure. Aortic valve regurgitation is mild to moderate. Moderate to severe aortic stenosis is present. Aortic valve mean  gradient measures 24.5 mmHg. Aortic valve peak gradient measures 43.2 mmHg. Aortic valve area, by VTI measures 0.99 cm. Pulmonic Valve: The pulmonic valve was normal in structure. Pulmonic valve regurgitation is not visualized. No evidence of pulmonic stenosis. Aorta: The aortic root is normal in size and structure. Venous: The inferior vena cava is normal in size with greater than 50% respiratory variability, suggesting right atrial pressure of 3 mmHg. IAS/Shunts: No atrial level shunt detected by color flow Doppler.  LEFT VENTRICLE PLAX 2D LVIDd:         2.76 cm     Diastology LVIDs:         2.18 cm     LV e' lateral:   7.83 cm/s LV PW:         0.94 cm     LV E/e' lateral: 11.0 LV IVS:        0.92 cm     LV e' medial:    4.79 cm/s LVOT diam:     1.70 cm     LV E/e' medial:  17.9 LV SV:         61 LV SV Index:   37 LVOT Area:     2.27 cm  LV Volumes (MOD) LV vol d, MOD A2C: 45.9 ml LV vol d, MOD A4C: 53.5 ml LV vol s, MOD A2C: 18.3 ml LV vol s, MOD A4C: 20.8 ml LV SV MOD A2C:     27.6 ml LV SV MOD A4C:     53.5 ml LV SV MOD BP:      29.5 ml RIGHT VENTRICLE RV Basal diam:  2.94 cm LEFT ATRIUM             Index       RIGHT ATRIUM          Index LA diam:        3.00 cm 1.79 cm/m  RA Area:     7.50 cm LA Vol (A2C):   17.4 ml 10.41 ml/m RA Volume:   11.10 ml 6.64 ml/m LA Vol (A4C):   34.0 ml 20.34 ml/m LA Biplane Vol: 26.4 ml 15.79 ml/m  AORTIC VALVE                    PULMONIC VALVE AV Area (Vmax):    0.85 cm     PV Vmax:       1.79 m/s AV Area (Vmean):   0.84 cm     PV Vmean:      119.000 cm/s AV Area (VTI):     0.99 cm     PV VTI:        0.339 m AV Vmax:           328.50 cm/s  PV Peak grad:  12.8 mmHg AV Vmean:          230.000 cm/s PV Mean grad:  7.0 mmHg AV VTI:            0.619 m AV Peak Grad:  43.2 mmHg AV Mean Grad:      24.5 mmHg LVOT Vmax:         123.00 cm/s LVOT Vmean:        85.600 cm/s LVOT VTI:          0.270 m LVOT/AV VTI ratio: 0.44  AORTA Ao Root diam: 3.00 cm MITRAL VALVE MV Area (PHT):  3.85 cm     SHUNTS MV Peak grad:  7.4 mmHg     Systemic VTI:  0.27 m MV Mean grad:  3.0 mmHg     Systemic Diam: 1.70 cm MV Vmax:       1.36 m/s MV Vmean:      85.3 cm/s MV Decel Time: 197 msec MV E velocity: 85.90 cm/s MV A velocity: 125.00 cm/s MV E/A ratio:  0.69 Isaias Cowman MD Electronically signed by Isaias Cowman MD Signature Date/Time: 03/17/2020/3:42:25 PM    Final    US BIOPSY (KIDNEY)  Result Date: 03/23/2020 INDICATION: 79 year old female with a history of acute kidney injury EXAM: IMAGE GUIDED RENAL BIOPSY MEDICATIONS: None. ANESTHESIA/SEDATION: Moderate (conscious) sedation was employed during this procedure. A total of Versed 1.0 mg and Fentanyl 50 mcg was administered intravenously. Moderate Sedation Time: 11 minutes. The patient's level of consciousness and vital signs were monitored continuously by radiology nursing throughout the procedure under my direct supervision. FLUOROSCOPY TIME:  Ultrasound COMPLICATIONS: None PROCEDURE: Informed written consent was obtained from the patient after a thorough discussion of the procedural risks, benefits and alternatives. All questions were addressed. Maximal Sterile Barrier Technique was utilized including caps, mask, sterile gowns, sterile gloves, sterile drape, hand hygiene and skin antiseptic. A timeout was performed prior to the initiation of the procedure. Patient was positioned prone position on the gantry table. Images were stored sent to PACs. Once the patient is prepped and draped in the usual sterile fashion, the skin and subcutaneous tissues overlying the left kidney were generously infiltrated 1% lidocaine for local anesthesia. Using ultrasound guidance, transverse approach, a 15 gauge guide needle was advanced into the lateral cortex of the left kidney. Once we confirmed location of the needle tip, 4 separate 16 gauge core biopsy were achieved. Two Gel-Foam pledgets were infused with a small amount of saline. The needle was  removed. Final images were stored. The patient tolerated the procedure well and remained hemodynamically stable throughout. No complications were encountered and no significant blood loss encountered. IMPRESSION: Status post ultrasound-guided medical kidney biopsy of the left kidney. Signed, Dulcy Fanny. Dellia Nims, RPVI [facility] Electronically Signed   By: Corrie Mckusick D.O.   On: 03/23/2020 11:37    ASSESSMENT: Pauci-immune glomerulonephritis.  PLAN:    1. Pauci-immune glomerulonephritis: Patient will receive 1000 mg IV Rituxan today.  Continue follow-up and evaluation per nephrology.  Patient will receive a second infusion on April 11, 2020 as an outpatient.  This appointment has been arranged.  She expressed understanding that any questions or concerns regarding her glomerulonephritis should be directed towards nephrology.  No other intervention is needed at this time.  Appreciate consult, call with questions.   Lloyd Huger, MD   03/28/2020 4:05 PM

## 2020-03-28 NOTE — Progress Notes (Signed)
[facility]  ROUNDING NOTE   Subjective:   Rituximab today   Objective:  Vital signs in last 24 hours:  Temp:  [97.7 F (36.5 C)-98.4 F (36.9 C)] 98.4 F (36.9 C) (04/06 0812) Pulse Rate:  [68-87] 81 (04/06 1411) Resp:  [16] 16 (04/06 0812) BP: (121-193)/(68-83) 121/70 (04/06 1411) SpO2:  [97 %-98 %] 98 % (04/06 0812) Weight:  [66 kg] 66 kg (04/06 0500)  Weight change: -3.6 kg Filed Weights   03/16/20 1709 03/27/20 0500 03/28/20 0500  Weight: 72 kg 69.6 kg 66 kg    Intake/Output: I/O last 3 completed shifts: In: 723 [P.O.:720; I.V.:3] Out: -    Intake/Output this shift:  Total I/O In: 80 [P.O.:80] Out: -   Physical Exam: General: No acute distress  Head: +oral lesions   Eyes: Anicteric  Neck: Supple, trachea midline  Lungs:  Clear to auscultation, normal effort  Heart: regular  Abdomen:  Soft, nontender, bowel sounds present  Extremities: No peripheral edema.  Neurologic: Awake, alert, following commands  Skin: Mild mallar rash on the face        Basic Metabolic Panel: Recent Labs  Lab 03/23/20 0433 03/23/20 0433 03/24/20 0439 03/24/20 0439 03/26/20 0430 03/27/20 0339 03/28/20 0427  NA 128*  --  129*  --  130* 132* 135  K 3.4*  --  3.6  --  4.5 3.5 4.3  CL 90*  --  92*  --  94* 97* 99  CO2 26  --  24  --  24 25 26   GLUCOSE 105*  --  190*  --  176* 113* 115*  BUN 54*  --  57*  --  89* 90* 84*  CREATININE 2.91*  --  3.09*  --  3.47* 3.31* 2.98*  CALCIUM 8.6*   < > 8.7*   < > 9.0 8.8* 9.0   < > = values in this interval not displayed.    Liver Function Tests: No results for input(s): AST, ALT, ALKPHOS, BILITOT, PROT, ALBUMIN in the last 168 hours. No results for input(s): LIPASE, AMYLASE in the last 168 hours. No results for input(s): AMMONIA in the last 168 hours.  CBC: Recent Labs  Lab 03/22/20 1147 03/23/20 0433 03/24/20 0439 03/26/20 0430 03/27/20 0339  WBC 12.6* 11.5* 8.6 14.0* 16.5*  NEUTROABS 8.5*  --   --   --    --   HGB 9.2* 8.8* 8.5* 8.8* 9.3*  HCT 27.0* 24.9* 25.3* 25.6* 27.2*  MCV 83.3 82.5 84.9 83.9 83.2  PLT 436* 377 341 441* 468*    Cardiac Enzymes: No results for input(s): CKTOTAL, CKMB, CKMBINDEX, TROPONINI in the last 168 hours.  BNP: Invalid input(s): POCBNP  CBG: No results for input(s): GLUCAP in the last 168 hours.  Microbiology: Results for orders placed or performed during the hospital encounter of 03/16/20  Urine Culture     Status: Abnormal   Collection Time: 03/16/20  5:49 PM   Specimen: Urine, Random  Result Value Ref Range Status   Specimen Description   Final    URINE, RANDOM Performed at [facility], [address] [place], [place] [address]    Special Requests   Final    NONE Performed at [facility], [address], [place], [place] [address]    Culture (A)  Final    <10,000 COLONIES/mL INSIGNIFICANT GROWTH Performed at [facility] [address], [place] [employer], [place] [address]    Report Status 03/17/2020 FINAL  Final  SARS CORONAVIRUS 2 (TAT 6-24 HRS) Nasopharyngeal Nasopharyngeal Swab     Status: None   Collection Time: 03/16/20  6:00 PM   Specimen: Nasopharyngeal Swab  Result Value Ref Range Status   SARS Coronavirus 2 NEGATIVE NEGATIVE Final    Comment: (NOTE) SARS-CoV-2 target nucleic acids are NOT DETECTED. The SARS-CoV-2 RNA is generally detectable in upper and lower respiratory specimens during the acute phase of infection. Negative results do not preclude SARS-CoV-2 infection, do not rule out co-infections with other pathogens, and should not be used as the sole basis for treatment or other patient management decisions. Negative results must be combined with clinical observations, patient history, and epidemiological information. The expected result is Negative. Fact Sheet for Patients: SugarRoll.be Fact Sheet for Healthcare Providers: https://www.woods-mathews.com/ This  test is not yet approved or cleared by the [place] FDA and  has been authorized for detection and/or diagnosis of SARS-CoV-2 by FDA under an Emergency Use Authorization (EUA). This EUA will remain  in effect (meaning this test can be used) for the duration of the COVID-19 declaration under Section 56 4(b)(1) of the Act, 21 U.S.C. section 360bbb-3(b)(1), unless the authorization is terminated or revoked sooner. Performed at [facility], [place] [address] [place], [place] [address]     Coagulation Studies: No results for input(s): LABPROT, INR in the last 72 hours.  Urinalysis: No results for input(s): COLORURINE, LABSPEC, PHURINE, GLUCOSEU, HGBUR, BILIRUBINUR, KETONESUR, PROTEINUR, UROBILINOGEN, NITRITE, LEUKOCYTESUR in the last 72 hours.  Invalid input(s): APPERANCEUR    Imaging: No results found.   Medications:   . famotidine (PEPCID) IV    . sodium chloride     . acetaminophen  650 mg Oral Once  . atenolol  25 mg Oral Daily  . bisacodyl  5 mg Oral Daily  . clonazePAM  0.25 mg Oral BID  . cloNIDine  0.1 mg Oral BID  . cyanocobalamin  1,000 mcg Intramuscular Daily  . diphenhydrAMINE  50 mg Oral Once  . docusate sodium  200 mg Oral BID  . feeding supplement (NEPRO CARB STEADY)  237 mL Oral Q24H  . heparin injection (subcutaneous)  5,000 Units Subcutaneous Q8H  . hydrALAZINE  50 mg Oral BID  . polyethylene glycol  17 g Oral Daily  . predniSONE  70 mg Oral Q breakfast  . scopolamine  1 patch Transdermal Q72H  . sodium chloride flush  3 mL Intravenous Once  . sodium chloride flush  3 mL Intravenous Q12H  . valACYclovir  500 mg Oral Daily   acetaminophen **OR** acetaminophen, albuterol, diphenhydrAMINE, EPINEPHrine, famotidine (PEPCID) IV, hydrALAZINE, magic mouthwash **AND** lidocaine, magnesium hydroxide, methylPREDNISolone (SOLU-MEDROL) injection, ondansetron (ZOFRAN) IV, ondansetron, sodium chloride, traMADol  Assessment/ Plan:   [name] is a  79 y.o. white female with hypertension, aortic stenosis, hyperlipidemia who is admitted to [facility] on 03/16/2020 for Acute pulmonary edema (HCC) [J81.0] Dyspnea [R06.00] AKI (acute kidney injury) ([place]) [N17.9] Acute congestive heart failure, unspecified heart failure type ([place]) [I50.9]   1.  Acute kidney injury with proteinuria and hematuria on chronic kidney disease stage IIIA: baseline creatinine of 1.1, GFR of 49 on 06/21/2019.  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  - prednisone 70mg  PO daily  - Rituximab today and then again on 4/20.   2. Hypertension: - steroid driven. Better control today.  Currently on clonidine, atenolol, hydralazine.  - holding lisinopril  3. Hyponatremia: with renal failure. Monitor volume  status  4. Anemia with renal failure:normocytic.   LOS: doctor with. [place] 4/6/20212:55 PM

## 2020-03-28 NOTE — Plan of Care (Signed)
Patient discharged home per MD order. All discharge instructions given and all questions answered. 

## 2020-03-28 NOTE — Progress Notes (Signed)
PT Cancellation Note  Patient Details Name: Linda Bradley MRN: [id number] DOB: 07-04-1941   Cancelled Treatment:    Reason Eval/Treat Not Completed: Other (comment).  Chart reviewed.  Pt sitting in recliner upon PT arrival.  Pt declined physical therapy session d/t feeling "anxious" about chemo this morning and just wanted to "sit here".  Nurse notified and reports pt has been ambulating well in room.  Will re-attempt PT treatment session at a later date/time.  Leitha Bleak, PT 03/28/20, 9:24 AM

## 2020-03-28 NOTE — Discharge Summary (Signed)
[facility] [place] NAME: Linda Bradley    MR#:  [id number]  DATE OF BIRTH:  1941-05-06  DATE OF ADMISSION:  03/16/2020 ADMITTING PHYSICIAN: Wyvonnia Dusky, MD  DATE OF DISCHARGE: 03/28/2020 PRIMARY CARE PHYSICIAN: Lynnell Jude, MD    ADMISSION DIAGNOSIS:  Acute pulmonary edema ([place]) [J81.0] Dyspnea [R06.00] AKI (acute kidney injury) ([place]) [N17.9] Acute congestive heart failure, unspecified heart failure type ([place]) [I50.9]  DISCHARGE DIAGNOSIS:  Acute on chronic kidney disease secondary to Pauciglomerulnephritis status post steroid and IV rituximab hypertensive urgency-- improved acute diastolic congestive heart failure-- improved  SECONDARY DIAGNOSIS:   Past Medical History:  Diagnosis Date  . Anxiety disorder   . Dermatitis   . 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 Heart murmur   . Hypercholesterolemia   . Hypertension   . Lymphadenitis   . Skin cancer   . Spontaneous ecchymoses   . Vitamin D deficiency     HOSPITAL COURSE:   79 y.o.femalewith past medical history of hypertension, aortic stenosis, hyperlipidemia, chronic kidney disease stage III a baseline creatinine 1.1 who presented to [facility] with hypertensive urgency, pulmonary edema, shortness of breath and found to have worsening acute kidney injury.  AKI on CKDIIIb: Creatinine worsening over interval 03/23/20 s/p Left Renal Biopsy-- per Dr. Juleen China pathology showed PauciGlomerulonephritis involving 50% of her kidney s/p Solu-Medrol 500 mg daily x3 days, 4/1=>4/3  4/5start prednisone 70 mg p.o. daily as per nephrologist--cont as outpt 4/6 started IV rituximab--oncology consulted--Dr Finnegan--f/u 04/11/20 for next dose Holding home ACE inhibitor Creat down to 2.98 Avoid nephrotoxins Follow up dr Holley Raring next week  HTNurgency w/ recent hx of uncontrolled HTN.  Continue clonidine0.1 mg po BID PTA  dose,hydralazine50 mg po BID and atenolol ACE inhibitor on hold  Dyspnea suspected due to  acute diastolic CHF Much improved Currently on room air Echo shows EF 96-75%, grade I diastolic dysfunction, & moderate to severe aortic stenosis Cardiology was following, signed off Continue to hold lasix secondary to AKI or CKD.  Monitor I/Os.   Acute hypoxic respiratory failure, resolved secondary to above. Weaned off of supplemental oxygen   anemia of chronic disease etiology unclear, possibly secondary to CKD. No need for a transfusion at this time.  Will continue on iron supplements as pt's iron is low.   transfuse as needed  Vitamin B12 deficiency: Received Vitamin B12 1000 mcg IM injection can be transitioned to oral vitamin B12 supplement on discharge. Repeat vitamin B12 level after 3 months and follow with PCP.   Hypokalemia:  Resolved, currently potassium 3.6=>4.5   Hyperglycemiamost likely secondary to steroids    DVT prophylaxis:lovenox Code Status:full  Family Communication:Patient seen at bedside. All questions answered . Daughter at bedside -- discussed discharge plan. Daughter voiced understanding. disposition Plan:Anticipate return to previous home environment.  Patient will discharged home today. CONSULTS OBTAINED:  Treatment Team:  Isaias Cowman, MD Liana Gerold, MD Lloyd Huger, MD Lavonia Dana, MD  DRUG ALLERGIES:   Allergies  Allergen Reactions  . Amlodipine Swelling  . Aspirin   . Chlorthalidone Diarrhea  . Coreg [Carvedilol] Other (See Comments)    Patient states it makes her sick. She refuses.  . Statins     Other reaction(s): Other (See Comments) Other Reaction: CNS DISORDER    DISCHARGE MEDICATIONS:   Allergies as of 03/28/2020      Reactions   Amlodipine Swelling   Aspirin    Chlorthalidone Diarrhea  Coreg [carvedilol] Other (See Comments)   Patient states it makes her sick. She refuses.    Statins    Other reaction(s): Other (See Comments) Other Reaction: CNS DISORDER      Medication List    STOP taking these medications   carvedilol 12.5 MG tablet Commonly known as: COREG   lisinopril 40 MG tablet Commonly known as: ZESTRIL     TAKE these medications   atenolol 25 MG tablet Commonly known as: TENORMIN Take 1 tablet (25 mg total) by mouth daily. Start taking on: March 29, 2020   clonazePAM 0.5 MG tablet Commonly known as: KLONOPIN Take 0.25 mg by mouth 2 (two) times daily.   cloNIDine 0.1 MG tablet Commonly known as: Catapres Take 1 tablet (0.1 mg total) by mouth 3 (three) times daily. What changed:   when to take this  additional instructions   cyanocobalamin 1000 MCG tablet Take 1 tablet (1,000 mcg total) by mouth daily.   feeding supplement (NEPRO CARB STEADY) Liqd Take 237 mLs by mouth daily. Start taking on: March 29, 2020   hydrALAZINE 50 MG tablet Commonly known as: APRESOLINE Take 1 tablet (50 mg total) by mouth in the morning and at bedtime. What changed: how much to take   iron polysaccharides 150 MG capsule Commonly known as: NIFEREX Take 1 capsule (150 mg total) by mouth daily.   predniSONE 10 MG tablet Commonly known as: DELTASONE Take 7 tablets (70 mg total) by mouth daily with breakfast. Start taking on: March 29, 2020       If you experience worsening of your admission symptoms, develop shortness of breath, life threatening emergency, suicidal or homicidal thoughts you must seek medical attention immediately by calling 911 or calling your MD immediately  if symptoms less severe.  You Must read complete instructions/literature along with all the possible adverse reactions/side effects for all the Medicines you take and that have been prescribed to you. Take any new Medicines after you have completely understood and accept all the possible adverse reactions/side effects.   Please note  You were cared for by a hospitalist  during your hospital stay. If you have any questions about your discharge medications or the care you received while you were in the hospital after you are discharged, you can call the unit and asked to speak with the hospitalist on call if the hospitalist that took care of you is not available. Once you are discharged, your primary care physician will handle any further medical issues. Please note that NO REFILLS for any discharge medications will be authorized once you are discharged, as it is imperative that you return to your primary care physician (or establish a relationship with a primary care physician if you do not have one) for your aftercare needs so that they can reassess your need for medications and monitor your lab values. Today   SUBJECTIVE  tolerated IV Rituximab infusion. No complaints.   VITAL SIGNS:  Blood pressure (!) 154/68, pulse 73, temperature 98.4 F (36.9 C), temperature source Oral, resp. rate 16, height 4\' 11"  (1.499 m), weight 66 kg, SpO2 98 %.  I/O:    Intake/Output Summary (Last 24 hours) at 03/28/2020 1709 Last data filed at 03/28/2020 1603 Gross per 24 hour  Intake 481.65 ml  Output --  Net 481.65 ml    PHYSICAL EXAMINATION:  GENERAL:  79 y.o.-year-old patient lying in the bed with no acute distress.  EYES: Pupils equal, round, reactive to light and accommodation. No scleral icterus.  HEENT: Head atraumatic, normocephalic. Oropharynx and nasopharynx clear.  NECK:  Supple, no jugular venous distention. No thyroid enlargement, no tenderness.  LUNGS: Normal breath sounds bilaterally, no wheezing, rales,rhonchi or crepitation. No use of accessory muscles of respiration.  CARDIOVASCULAR: S1, S2 normal. No murmurs, rubs, or gallops.  ABDOMEN: Soft, non-tender, non-distended. Bowel sounds present. No organomegaly or mass.  EXTREMITIES: No pedal edema, cyanosis, or clubbing.  NEUROLOGIC: Cranial nerves II through XII are intact. Muscle strength 5/5 in all  extremities. Sensation intact. Gait not checked.  PSYCHIATRIC: The patient is alert and oriented x 3.  SKIN: No obvious rash, lesion, or ulcer.   DATA REVIEW:   CBC  Recent Labs  Lab 03/27/20 0339  WBC 16.5*  HGB 9.3*  HCT 27.2*  PLT 468*    Chemistries  Recent Labs  Lab 03/28/20 0427  NA 135  K 4.3  CL 99  CO2 26  GLUCOSE 115*  BUN 84*  CREATININE 2.98*  CALCIUM 9.0    Microbiology Results   No results found for this or any previous visit (from the past 240 hour(s)).  RADIOLOGY:  No results found.   CODE STATUS:     Code Status Orders  (From admission, onward)         Start     Ordered   03/16/20 1726  Full code  Continuous     03/16/20 1726        Code Status History    Date Active Date Inactive Code Status Order ID Comments User Context   03/14/2016 0926 03/17/2016 1445 Full Code [id number]  Vaughan Basta, MD ED   Advance Care Planning Activity    Advance Directive Documentation     Most Recent Value  Type of Advance Directive  Healthcare Power of Attorney  Pre-existing out of facility DNR order (yellow form or pink MOST form)  --  "MOST" Form in Place?  --       TOTAL TIME TAKING CARE OF THIS PATIENT: *28minutes.    Fritzi Mandes M.D  [facility]    CC: Primary care physician; Lynnell Jude, MD

## 2020-03-28 NOTE — Progress Notes (Signed)
Patient received Rituxan today in the [facility]. Tolerated treatment without any issues. Report called to Virl Axe RN and patient returned to Rm 136 via transporter [employer]

## 2020-04-03 ENCOUNTER — Encounter: Payer: Self-pay | Admitting: Nephrology

## 2020-04-03 LAB — SURGICAL PATHOLOGY

## 2020-04-04 NOTE — Progress Notes (Signed)
Pharmacist Chemotherapy Monitoring - Follow Up Assessment    I verify that I have reviewed each item in the below checklist:  . Regimen for the patient is scheduled for the appropriate day and plan matches scheduled date. Marland Kitchen Appropriate non-routine labs are ordered dependent on drug ordered. . If applicable, additional medications reviewed and ordered per protocol based on lifetime cumulative doses and/or treatment regimen.   Plan for follow-up and/or issues identified: No . I-vent associated with next due treatment: No . MD and/or nursing notified: No  Linda Bradley K 04/04/2020 8:36 AM

## 2020-04-07 NOTE — Progress Notes (Signed)
  [facility]  [phone] [phone]  ID: Linda Bradley OB: [birth year]  MR#: [id number]  OXB#:[id number]  Patient Care Team: Lynnell Jude, MD as PCP - General (Family Medicine)    Lloyd Huger, MD   04/12/2020 6:44 AM   This encounter was created in error - please disregard.

## 2020-04-10 ENCOUNTER — Ambulatory Visit
Admission: RE | Admit: 2020-04-10 | Discharge: 2020-04-10 | Disposition: A | Discharge status: Home / Self Care | Payer: Medicare HMO | Source: Ambulatory Visit | Attending: Family Medicine | Admitting: Family Medicine

## 2020-04-10 ENCOUNTER — Other Ambulatory Visit: Payer: Self-pay

## 2020-04-10 ENCOUNTER — Other Ambulatory Visit: Payer: Self-pay | Admitting: Emergency Medicine

## 2020-04-10 ENCOUNTER — Ambulatory Visit
Admission: RE | Admit: 2020-04-10 | Discharge: 2020-04-10 | Disposition: A | Discharge status: Home / Self Care | Payer: Medicare HMO | Source: Home / Self Care | Attending: Family Medicine | Admitting: Family Medicine

## 2020-04-10 ENCOUNTER — Other Ambulatory Visit: Payer: Self-pay | Admitting: Family Medicine

## 2020-04-10 ENCOUNTER — Other Ambulatory Visit: Payer: Self-pay | Admitting: Oncology

## 2020-04-10 ENCOUNTER — Encounter: Payer: Self-pay | Admitting: Oncology

## 2020-04-10 DIAGNOSIS — J810 Acute pulmonary edema: Secondary | ICD-10-CM | POA: Diagnosis not present

## 2020-04-10 DIAGNOSIS — R0602 Shortness of breath: Secondary | ICD-10-CM

## 2020-04-10 DIAGNOSIS — M3131 Wegener's granulomatosis with renal involvement: Secondary | ICD-10-CM | POA: Diagnosis not present

## 2020-04-10 DIAGNOSIS — N009 Acute nephritic syndrome with unspecified morphologic changes: Secondary | ICD-10-CM

## 2020-04-10 NOTE — Progress Notes (Signed)
Called patient for prescreening assessment. Patient states she has had some shortness of breath for past few days. Her son took her to urgent care where she had chest xray. Patient denied having any pain. States she is having some trouble sleeping.

## 2020-04-11 ENCOUNTER — Inpatient Hospital Stay
Admission: EM | Admit: 2020-04-11 | Discharge: 2020-04-14 | DRG: 542 | Disposition: A | Discharge status: Home / Self Care | Payer: Medicare HMO | Attending: Internal Medicine | Admitting: Internal Medicine

## 2020-04-11 ENCOUNTER — Inpatient Hospital Stay: Payer: Medicare HMO | Admitting: Oncology

## 2020-04-11 ENCOUNTER — Other Ambulatory Visit: Payer: Self-pay

## 2020-04-11 ENCOUNTER — Encounter: Payer: Self-pay | Admitting: Emergency Medicine

## 2020-04-11 ENCOUNTER — Emergency Department: Payer: Medicare HMO

## 2020-04-11 ENCOUNTER — Inpatient Hospital Stay: Payer: Medicare HMO

## 2020-04-11 DIAGNOSIS — I129 Hypertensive chronic kidney disease with stage 1 through stage 4 chronic kidney disease, or unspecified chronic kidney disease: Secondary | ICD-10-CM | POA: Diagnosis not present

## 2020-04-11 DIAGNOSIS — Z79899 Other long term (current) drug therapy: Secondary | ICD-10-CM | POA: Diagnosis not present

## 2020-04-11 DIAGNOSIS — J849 Interstitial pulmonary disease, unspecified: Secondary | ICD-10-CM | POA: Diagnosis present

## 2020-04-11 DIAGNOSIS — I130 Hypertensive heart and chronic kidney disease with heart failure and stage 1 through stage 4 chronic kidney disease, or unspecified chronic kidney disease: Secondary | ICD-10-CM | POA: Diagnosis present

## 2020-04-11 DIAGNOSIS — Z7952 Long term (current) use of systemic steroids: Secondary | ICD-10-CM

## 2020-04-11 DIAGNOSIS — Z862 Personal history of diseases of the blood and blood-forming organs and certain disorders involving the immune mechanism: Secondary | ICD-10-CM | POA: Diagnosis not present

## 2020-04-11 DIAGNOSIS — J810 Acute pulmonary edema: Secondary | ICD-10-CM

## 2020-04-11 DIAGNOSIS — J9621 Acute and chronic respiratory failure with hypoxia: Secondary | ICD-10-CM | POA: Diagnosis present

## 2020-04-11 DIAGNOSIS — E785 Hyperlipidemia, unspecified: Secondary | ICD-10-CM | POA: Diagnosis present

## 2020-04-11 DIAGNOSIS — E538 Deficiency of other specified B group vitamins: Secondary | ICD-10-CM | POA: Diagnosis present

## 2020-04-11 DIAGNOSIS — M3131 Wegener's granulomatosis with renal involvement: Secondary | ICD-10-CM | POA: Diagnosis present

## 2020-04-11 DIAGNOSIS — B948 Sequelae of other specified infectious and parasitic diseases: Secondary | ICD-10-CM | POA: Diagnosis not present

## 2020-04-11 DIAGNOSIS — N189 Chronic kidney disease, unspecified: Secondary | ICD-10-CM

## 2020-04-11 DIAGNOSIS — N017 Rapidly progressive nephritic syndrome with diffuse crescentic glomerulonephritis: Secondary | ICD-10-CM | POA: Diagnosis present

## 2020-04-11 DIAGNOSIS — D631 Anemia in chronic kidney disease: Secondary | ICD-10-CM | POA: Diagnosis present

## 2020-04-11 DIAGNOSIS — K921 Melena: Secondary | ICD-10-CM

## 2020-04-11 DIAGNOSIS — I350 Nonrheumatic aortic (valve) stenosis: Secondary | ICD-10-CM | POA: Diagnosis present

## 2020-04-11 DIAGNOSIS — Z8249 Family history of ischemic heart disease and other diseases of the circulatory system: Secondary | ICD-10-CM | POA: Diagnosis not present

## 2020-04-11 DIAGNOSIS — I5032 Chronic diastolic (congestive) heart failure: Secondary | ICD-10-CM | POA: Diagnosis present

## 2020-04-11 DIAGNOSIS — J84116 Cryptogenic organizing pneumonia: Secondary | ICD-10-CM | POA: Diagnosis present

## 2020-04-11 DIAGNOSIS — D62 Acute posthemorrhagic anemia: Secondary | ICD-10-CM | POA: Diagnosis present

## 2020-04-11 DIAGNOSIS — I160 Hypertensive urgency: Secondary | ICD-10-CM | POA: Diagnosis present

## 2020-04-11 DIAGNOSIS — Z9981 Dependence on supplemental oxygen: Secondary | ICD-10-CM

## 2020-04-11 DIAGNOSIS — N184 Chronic kidney disease, stage 4 (severe): Secondary | ICD-10-CM | POA: Diagnosis present

## 2020-04-11 DIAGNOSIS — D473 Essential (hemorrhagic) thrombocythemia: Secondary | ICD-10-CM | POA: Diagnosis present

## 2020-04-11 DIAGNOSIS — J9601 Acute respiratory failure with hypoxia: Secondary | ICD-10-CM

## 2020-04-11 LAB — BLOOD GAS, ARTERIAL
Acid-Base Excess: 3.6 mmol/L — ABNORMAL HIGH (ref 0.0–2.0)
Bicarbonate: 26.3 mmol/L (ref 20.0–28.0)
FIO2: 0.28
O2 Saturation: 95 %
Patient temperature: 37
pCO2 arterial: 33 mmHg (ref 32.0–48.0)
pH, Arterial: 7.51 — ABNORMAL HIGH (ref 7.350–7.450)
pO2, Arterial: 68 mmHg — ABNORMAL LOW (ref 83.0–108.0)

## 2020-04-11 LAB — BASIC METABOLIC PANEL
Anion gap: 11 (ref 5–15)
BUN: 56 mg/dL — ABNORMAL HIGH (ref 8–23)
CO2: 23 mmol/L (ref 22–32)
Calcium: 8.8 mg/dL — ABNORMAL LOW (ref 8.9–10.3)
Chloride: 101 mmol/L (ref 98–111)
Creatinine, Ser: 2.15 mg/dL — ABNORMAL HIGH (ref 0.44–1.00)
GFR calc Af Amer: 25 mL/min — ABNORMAL LOW (ref >60)
GFR calc non Af Amer: 22 mL/min — ABNORMAL LOW (ref >60)
Glucose, Bld: 139 mg/dL — ABNORMAL HIGH (ref 70–99)
Potassium: 3.5 mmol/L (ref 3.5–5.1)
Sodium: 135 mmol/L (ref 135–145)

## 2020-04-11 LAB — CBC
HCT: 22.8 % — ABNORMAL LOW (ref 36.0–46.0)
Hemoglobin: 7.9 g/dL — ABNORMAL LOW (ref 12.0–15.0)
MCH: 29.9 pg (ref 26.0–34.0)
MCHC: 34.6 g/dL (ref 30.0–36.0)
MCV: 86.4 fL (ref 80.0–100.0)
Platelets: 229 10*3/uL (ref 150–400)
RBC: 2.64 MIL/uL — ABNORMAL LOW (ref 3.87–5.11)
RDW: 16.2 % — ABNORMAL HIGH (ref 11.5–15.5)
WBC: 17.8 10*3/uL — ABNORMAL HIGH (ref 4.0–10.5)
nRBC: 0 % (ref 0.0–0.2)

## 2020-04-11 LAB — RESPIRATORY PANEL BY RT PCR (FLU A&B, COVID)
Influenza A by PCR: NEGATIVE
Influenza B by PCR: NEGATIVE
SARS Coronavirus 2 by RT PCR: NEGATIVE

## 2020-04-11 LAB — POC SARS CORONAVIRUS 2 AG: SARS Coronavirus 2 Ag: NEGATIVE

## 2020-04-11 LAB — TROPONIN I (HIGH SENSITIVITY)
Troponin I (High Sensitivity): 20 ng/L — ABNORMAL HIGH (ref <18)
Troponin I (High Sensitivity): 19 ng/L — ABNORMAL HIGH (ref <18)

## 2020-04-11 MED ORDER — HYDRALAZINE HCL 50 MG PO TABS
50.0000 mg | ORAL_TABLET | Freq: Two times a day (BID) | ORAL | Status: DC
  Administered 2020-04-11 – 2020-04-12 (×2): 50 mg via ORAL
  Filled 2020-04-11 (×3): qty 1

## 2020-04-11 MED ORDER — FUROSEMIDE 10 MG/ML IJ SOLN
40.0000 mg | Freq: Once | INTRAMUSCULAR | Status: AC
  Administered 2020-04-11: 12:00:00 40 mg via INTRAVENOUS
  Filled 2020-04-11: qty 4

## 2020-04-11 MED ORDER — HEPARIN SODIUM (PORCINE) 5000 UNIT/ML IJ SOLN
5000.0000 [IU] | Freq: Three times a day (TID) | INTRAMUSCULAR | Status: DC
  Administered 2020-04-11 – 2020-04-13 (×6): 5000 [IU] via SUBCUTANEOUS
  Filled 2020-04-11 (×6): qty 1

## 2020-04-11 MED ORDER — PREDNISONE 50 MG PO TABS
60.0000 mg | ORAL_TABLET | Freq: Every day | ORAL | Status: DC
  Filled 2020-04-11: qty 1

## 2020-04-11 MED ORDER — CLONIDINE HCL 0.1 MG PO TABS
0.1000 mg | ORAL_TABLET | Freq: Three times a day (TID) | ORAL | Status: DC
  Administered 2020-04-11 – 2020-04-14 (×9): 0.1 mg via ORAL
  Filled 2020-04-11 (×10): qty 1

## 2020-04-11 MED ORDER — CLONAZEPAM 0.5 MG PO TABS
0.5000 mg | ORAL_TABLET | Freq: Three times a day (TID) | ORAL | Status: DC | PRN
  Administered 2020-04-11 – 2020-04-14 (×6): 0.5 mg via ORAL
  Filled 2020-04-11 (×6): qty 1

## 2020-04-11 MED ORDER — ACETAMINOPHEN 325 MG PO TABS: 650.0000 mg | ORAL_TABLET | Freq: Four times a day (QID) | ORAL | Status: DC | PRN

## 2020-04-11 MED ORDER — SODIUM CHLORIDE 0.9% FLUSH
3.0000 mL | Freq: Two times a day (BID) | INTRAVENOUS | Status: DC
  Administered 2020-04-11 – 2020-04-14 (×6): 3 mL via INTRAVENOUS

## 2020-04-11 MED ORDER — ONDANSETRON HCL 4 MG/2ML IJ SOLN: 4.0000 mg | Freq: Four times a day (QID) | INTRAMUSCULAR | Status: DC | PRN

## 2020-04-11 MED ORDER — SODIUM CHLORIDE 0.9 % IV SOLN: 250.0000 mL | INTRAVENOUS | Status: DC | PRN

## 2020-04-11 MED ORDER — SODIUM CHLORIDE 0.9% FLUSH: 3.0000 mL | INTRAVENOUS | Status: DC | PRN

## 2020-04-11 MED ORDER — VITAMIN B-12 1000 MCG PO TABS
1000.0000 ug | ORAL_TABLET | Freq: Every day | ORAL | Status: DC
  Administered 2020-04-12 – 2020-04-14 (×3): 1000 ug via ORAL
  Filled 2020-04-11 (×4): qty 1

## 2020-04-11 MED ORDER — NEPRO/CARBSTEADY PO LIQD: 237.0000 mL | ORAL | Status: DC

## 2020-04-11 MED ORDER — IPRATROPIUM-ALBUTEROL 0.5-2.5 (3) MG/3ML IN SOLN: 3.0000 mL | Freq: Four times a day (QID) | RESPIRATORY_TRACT | Status: DC | PRN

## 2020-04-11 MED ORDER — CLONIDINE HCL 0.1 MG PO TABS: 0.1000 mg | ORAL_TABLET | Freq: Once | ORAL | Status: DC

## 2020-04-11 MED ORDER — ONDANSETRON HCL 4 MG PO TABS: 4.0000 mg | ORAL_TABLET | Freq: Four times a day (QID) | ORAL | Status: DC | PRN

## 2020-04-11 MED ORDER — ATENOLOL 25 MG PO TABS: 25.0000 mg | ORAL_TABLET | Freq: Every day | ORAL | Status: DC

## 2020-04-11 MED ORDER — LISINOPRIL 20 MG PO TABS
40.0000 mg | ORAL_TABLET | Freq: Every day | ORAL | Status: DC
  Filled 2020-04-11: qty 2

## 2020-04-11 MED ORDER — ACETAMINOPHEN 650 MG RE SUPP: 650.0000 mg | Freq: Four times a day (QID) | RECTAL | Status: DC | PRN

## 2020-04-11 MED ORDER — POLYSACCHARIDE IRON COMPLEX 150 MG PO CAPS
150.0000 mg | ORAL_CAPSULE | Freq: Every day | ORAL | Status: DC
  Administered 2020-04-12 – 2020-04-14 (×3): 150 mg via ORAL
  Filled 2020-04-11 (×4): qty 1

## 2020-04-11 NOTE — ED Triage Notes (Signed)
Pt reports was supposed to go and have a rexulta infusion but has been feeling short of breath do her MD advised that she come to the ED for evaluation. Pt denies pain but does reports some dizziness this am

## 2020-04-11 NOTE — ED Notes (Signed)
This RN to ambulated pt around room. Pt oxygen sat reading 88% on RA before ambulation. Pt then ambulated around room with oxygen sats dropping to 86% on RA. Pt not in distress while ambulating. EDP made aware.   Pt also placed on 3L oxygen BNC in an attempt to get oxygen sats above 90%. Pt oxygen now reading 95% on 3L.

## 2020-04-11 NOTE — ED Provider Notes (Signed)
[facility] Emergency Department Provider Note  ____________________________________________  Time seen: Approximately 11:07 AM  I have reviewed the triage vital signs and the nursing notes.   HISTORY  Chief Complaint Shortness of Breath and Dizziness    HPI Linda Bradley is a 79 y.o. female with a history of polyangiitis, hypertension who was sent to the ED today from infusion center after her Rituxan infusion was canceled due to shortness of breath.  She reports this has been going on for the last 2 to 3 days, and shortness of breath is intermittent, worse with lying down supine or exertion.  Better with rest and sitting upright.  Denies chest pain.  No fevers chills body aches or sweats.  Occasional cough but nothing productive.  Denies a history of heart failure.  She is also noticed that her ankles have started swelling since last night.  Denies leg pain.      Past Medical History:  Diagnosis Date  . Anxiety disorder   . Dermatitis   . 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 Heart murmur   . Hypercholesterolemia   . Hypertension   . Lymphadenitis   . Skin cancer   . Spontaneous ecchymoses   . Vitamin D deficiency      Patient Active Problem List   Diagnosis Date Noted  . 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 Glomerulonephritis, acute   . Acute congestive heart failure ([place])   . AKI (acute kidney injury) ([place])   . Anemia of chronic disease   . Dyspnea 03/16/2020  . Hypokalemia 03/14/2016  . Hyponatremia 03/14/2016  . Aortic stenosis, mild 05/26/2015  . Abnormal EKG 04/14/2015  . Dyspnea on exertion 04/14/2015  . Accelerated essential hypertension 04/14/2015     Past Surgical History:  Procedure Laterality Date  . CARPAL TUNNEL RELEASE     [facility]  . TONSILLECTOMY    . TRANSTHORACIC ECHOCARDIOGRAM  05/01/2015    EF 60-65%. Abnormal relaxation. Mild aortic stenosis (peak gradient 19 mmHg).     Prior to  Admission medications   Medication Sig Start Date End Date Taking? Authorizing Provider  atenolol (TENORMIN) 25 MG tablet Take 1 tablet (25 mg total) by mouth daily. 03/29/20   Fritzi Mandes, MD  clonazePAM (KLONOPIN) 0.5 MG tablet Take 0.25 mg by mouth 2 (two) times daily.     [provider]  cloNIDine (CATAPRES) 0.1 MG tablet Take 1 tablet (0.1 mg total) by mouth 3 (three) times daily. Patient taking differently: Take 0.1 mg by mouth 2 (two) times daily. Take and extra one if >160 if needed 05/01/17 03/16/20  Delman Kitten, MD  hydrALAZINE (APRESOLINE) 50 MG tablet Take 1 tablet (50 mg total) by mouth in the morning and at bedtime. 03/28/20   Fritzi Mandes, MD  iron polysaccharides (NIFEREX) 150 MG capsule Take 1 capsule (150 mg total) by mouth daily. 03/28/20   Fritzi Mandes, MD  Nutritional Supplements (FEEDING SUPPLEMENT, NEPRO CARB STEADY,) LIQD Take 237 mLs by mouth daily. 03/29/20   Fritzi Mandes, MD  predniSONE (DELTASONE) 10 MG tablet Take 7 tablets (70 mg total) by mouth daily with breakfast. 03/29/20   Fritzi Mandes, MD  vitamin B-12 1000 MCG tablet Take 1 tablet (1,000 mcg total) by mouth daily. 03/28/20   Fritzi Mandes, MD     Allergies Amlodipine, Aspirin, Chlorthalidone, Coreg [carvedilol], and Statins   Family History  Problem Relation Age of Onset  . Heart disease Father   . Heart failure Father  Social History Social History   Tobacco Use  . Smoking status: Never Smoker  . Smokeless tobacco: Never Used  Substance Use Topics  . Alcohol use: No  . Drug use: No    Review of Systems  Constitutional:   No fever or chills.  ENT:   No sore throat. No rhinorrhea. Cardiovascular:   No chest pain or syncope. Respiratory: Positive shortness of breath and nonproductive cough. Gastrointestinal:   Negative for abdominal pain, vomiting and diarrhea.  Musculoskeletal:   Negative for focal pain, positive ankle swelling All other systems reviewed and are negative except as documented  above in ROS and HPI.  ____________________________________________   PHYSICAL EXAM:  VITAL SIGNS: ED Triage Vitals  Enc Vitals Group     BP 04/11/20 1012 (!) 200/83     Pulse Rate 04/11/20 1010 77     Resp 04/11/20 1010 20     Temp 04/11/20 1012 98.1 F (36.7 C)     Temp Source 04/11/20 1012 Oral     SpO2 04/11/20 1012 93 %     Weight 04/11/20 1010 145 lb (65.8 kg)     Height 04/11/20 1010 4\' 11"  (1.499 m)     Head Circumference --      Peak Flow --      Pain Score 04/11/20 1010 0     Pain Loc --      Pain Edu? --      Excl. in [place]? --     Vital signs reviewed, nursing assessments reviewed.   Constitutional:   Alert and oriented. Non-toxic appearance. Eyes:   Conjunctivae are normal. EOMI. PERRL. ENT      Head:   Normocephalic and atraumatic.      Nose:   Wearing a mask.      Mouth/Throat:   Wearing a mask.      Neck:   No meningismus. Full ROM.  No JVD Hematological/Lymphatic/Immunilogical:   No cervical lymphadenopathy. Cardiovascular:   RRR. Symmetric bilateral radial and DP pulses.  No murmurs. Cap refill less than 2 seconds. Respiratory:   Normal respiratory effort without tachypnea/retractions. Breath sounds are clear and equal bilaterally. No wheezes/rales/rhonchi. Gastrointestinal:   Soft and nontender. Non distended. There is no CVA tenderness.  No rebound, rigidity, or guarding.  Musculoskeletal:   Normal range of motion in all extremities. No joint effusions.  No lower extremity tenderness.  1+ pitting edema bilateral lower extremities.  No calf tenderness or palpable cord, negative Homans' sign Neurologic:   Normal speech and language.  Motor grossly intact. No acute focal neurologic deficits are appreciated.  Skin:    Skin is warm, dry and intact. No rash noted.  No petechiae, purpura, or bullae.  ____________________________________________    LABS (pertinent positives/negatives) (all labs ordered are listed, but only abnormal results are  displayed) Labs Reviewed  BASIC METABOLIC PANEL - Abnormal; Notable for the following components:      Result Value   Glucose, Bld 139 (*)    BUN 56 (*)    Creatinine, Ser 2.15 (*)    Calcium 8.8 (*)    GFR calc non Af Amer 22 (*)    GFR calc Af Amer 25 (*)    All other components within normal limits  CBC - Abnormal; Notable for the following components:   WBC 17.8 (*)    RBC 2.64 (*)    Hemoglobin 7.9 (*)    HCT 22.8 (*)    RDW 16.2 (*)    All other  components within normal limits  TROPONIN I (HIGH SENSITIVITY) - Abnormal; Notable for the following components:   Troponin I (High Sensitivity) 20 (*)    All other components within normal limits  SARS CORONAVIRUS 2 (TAT 6-24 HRS)  RESPIRATORY PANEL BY RT PCR (FLU A&B, COVID)  POC SARS CORONAVIRUS 2 AG  POC SARS CORONAVIRUS 2 AG -  ED   ____________________________________________   EKG  Interpreted by me Sinus rhythm rate of 79, normal axis and intervals.  Poor R wave progression.  Normal ST segments and T waves.  No acute ischemic changes.  ____________________________________________    RADIOLOGY  DG Chest 2 View  Result Date: 04/11/2020 CLINICAL DATA:  Shortness of breath. EXAM: CHEST - 2 VIEW COMPARISON:  04/10/2020 FINDINGS: The heart is normal in size. Persistent and slightly more pronounced perihilar interstitial process likely noncardiogenic perihilar pulmonary edema. Symmetric bilateral infiltrates are also possible but less likely. No pleural effusions. The bony thorax is intact. IMPRESSION: Persistent and slightly more pronounced perihilar interstitial process, likely noncardiogenic perihilar pulmonary edema. Electronically Signed   By: Marijo Sanes M.D.   On: 04/11/2020 11:16   DG Chest 2 View  Result Date: 04/10/2020 CLINICAL DATA:  79 year old female with shortness of breath. EXAM: CHEST - 2 VIEW COMPARISON:  Chest radiograph dated 03/18/2020 FINDINGS: Bilateral patchy nodular and hazy opacities may  represent edema or pneumonia. Clinical correlation is recommended. There is no pleural effusion pneumothorax. The cardiac silhouette is within limits. No acute osseous pathology. Degenerative changes of the spine and scoliosis. Atherosclerotic calcification of the aorta. IMPRESSION: Bilateral patchy nodular and hazy opacities may represent edema or pneumonia. Electronically Signed   By: Anner Crete M.D.   On: 04/10/2020 20:53    ____________________________________________   PROCEDURES Procedures  ____________________________________________  DIFFERENTIAL DIAGNOSIS   NSTEMI, pneumonia, pleural effusion, pulmonary edema, new onset CHF  CLINICAL IMPRESSION / ASSESSMENT AND PLAN / ED COURSE  Medications ordered in the ED: Medications  furosemide (LASIX) injection 40 mg (has no administration in time range)    Pertinent labs & imaging results that were available during my care of the patient were reviewed by me and considered in my medical decision making (see chart for details).  Linda Bradley was evaluated in Emergency Department on 04/11/2020 for the symptoms described in the history of present illness. She was evaluated in the context of the global COVID-19 pandemic, which necessitated consideration that the patient might be at risk for infection with the SARS-CoV-2 virus that causes COVID-19. Institutional protocols and algorithms that pertain to the evaluation of patients at risk for COVID-19 are in a state of rapid change based on information released by regulatory bodies including the CDC and federal and state organizations. These policies and algorithms were followed during the patient's care in the ED.   Patient presents with dyspnea on exertion and orthopnea for the past few days.  Does not have a history of heart failure.  Check chest x-ray and labs.  EKG is nonischemic.  Vital signs at rest unremarkable except for severe hypertension.  Clinical Course as of Apr 11 1145   Tue Apr 11, 2020  1051 Chest x-ray image viewed by me, shows diffuse hazy infiltrate consistent with edema or Covid pneumonitis or multifocal pneumonia versus chronic interstitial disease.     [PS]  3893 Labs show leukocytosis of 18,000 which appears to be chronic over the last few months compared to prior values.  Hemoglobin is 7.9 also consistent with chronic anemia related  to CKD.  Also has creatinine at baseline for her CKD.  Troponin of 20 is better than previous levels.  No acute findings on these lab values.   [PS]    Clinical Course User Index [PS] Carrie Mew, MD    ----------------------------------------- 11:46 AM on 04/11/2020 -----------------------------------------  Patient hypoxic to 88% at rest, drops to 86% on room air with ambulation with worsening shortness of breath.  Stabilized on 3 L nasal cannula.  I will order IV Lasix, discussed with hospitalist for further management.  Point-of-care Covid is negative so we will send a PCR.  Hospitalist request noncontrast CT scan of the chest as well.  ____________________________________________   FINAL CLINICAL IMPRESSION(S) / ED DIAGNOSES    Final diagnoses:  Acute pulmonary edema ([place])  Acute respiratory failure with hypoxia [facility])     ED Discharge Orders    None      Portions of this note were generated with dragon dictation software. Dictation errors may occur despite best attempts at proofreading.   Carrie Mew, MD 04/11/20 1147

## 2020-04-11 NOTE — H&P (Signed)
History and Physical    [name] DXA:[id number] DOB: 08/18/1941 DOA: 04/11/2020  PCP: Lynnell Jude, MD   Patient coming from: Home  I have personally briefly reviewed patient's old medical records in [place]  Chief Complaint: Shortness of breath  HPI: Linda Bradley is a 79 y.o. female with medical history significant for polyangiitis, hypertension and stage IV chronic kidney disease who was sent to the emergency room from the infusion center for evaluation of shortness of breath.  Patient was seen at the infusion center where she was scheduled for her Rituxan infusion but this was canceled because of her complaints of shortness of breath.  She has had shortness of breath for about 2 to 3 days, it is intermittent and worse with lying down or exertion. It improves with sitting upright and at rest.  She denies having any chest pain, no fever, no chills, no abdominal pain, no nausea or vomiting. She has a cough which is nonproductive and also has lower extremity swelling. Patient was noted to be hypoxic to 88% at rest, drops to 86% on room air with ambulation associated with worsening shortness of breath.  Stabilized on 3 L nasal cannula.  She received a dose of IV Lasix in the ER. She had a chest x-ray which showed persistent and slightly more pronounced perihilar interstitial process, likely noncardiogenic perihilar pulmonary edema. CT scan of the chest without contrast showed diffuse reticular and ground-glass opacities are noted throughout both lungs with peripheral or subpleural sparing. These findings are most consistent with interstitial lung disease, particularly cryptogenic organizing pneumonia.  ED Course: Patient presents with dyspnea on exertion and orthopnea for the past few days.  Does not have a history of heart failure. Vital signs at rest unremarkable except for severe hypertension. Chest x-ray shows diffuse hazy infiltrate consistent with edema or Covid  pneumonitis or multifocal pneumonia versus chronic interstitial disease. Labs show leukocytosis of 18,000 which appears to be chronic over the last few months compared to prior values.  Hemoglobin is 7.9 also consistent with chronic anemia related to CKD.     Review of Systems: As per HPI otherwise 10 point review of systems negative.    Past Medical History:  Diagnosis Date  . Anxiety disorder   . Dermatitis   . 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 Heart murmur   . Hypercholesterolemia   . Hypertension   . Lymphadenitis   . Skin cancer   . Spontaneous ecchymoses   . Vitamin D deficiency     Past Surgical History:  Procedure Laterality Date  . CARPAL TUNNEL RELEASE     [facility]  . TONSILLECTOMY    . TRANSTHORACIC ECHOCARDIOGRAM  05/01/2015    EF 60-65%. Abnormal relaxation. Mild aortic stenosis (peak gradient 19 mmHg).     reports that she has never smoked. She has never used smokeless tobacco. She reports that she does not drink alcohol or use drugs.  Allergies  Allergen Reactions  . Amlodipine Swelling  . Aspirin   . Chlorthalidone Diarrhea  . Coreg [Carvedilol] Other (See Comments)    Patient states it makes her sick. She refuses.  . Statins     Other reaction(s): Other (See Comments) Other Reaction: CNS DISORDER    Family History  Problem Relation Age of Onset  . Heart disease Father   . Heart failure Father      Prior to Admission medications   Medication Sig Start Date End Date Taking? Authorizing Provider  atenolol (TENORMIN) 25 MG tablet Take 1 tablet (25 mg total) by mouth daily. 03/29/20  Yes Fritzi Mandes, MD  clonazePAM (KLONOPIN) 0.5 MG tablet Take 0.5 mg by mouth 3 (three) times daily as needed for anxiety.    Yes [provider]  hydrALAZINE (APRESOLINE) 50 MG tablet Take 1 tablet (50 mg total) by mouth in the morning and at bedtime. 03/28/20  Yes Fritzi Mandes, MD  iron polysaccharides (NIFEREX) 150 MG capsule Take 1 capsule (150 mg  total) by mouth daily. 03/28/20  Yes Fritzi Mandes, MD  lisinopril (ZESTRIL) 40 MG tablet Take 40 mg by mouth daily. 03/30/20  Yes [provider]  Nutritional Supplements (FEEDING SUPPLEMENT, NEPRO CARB STEADY,) LIQD Take 237 mLs by mouth daily. 03/29/20  Yes Fritzi Mandes, MD  predniSONE (DELTASONE) 10 MG tablet Take 7 tablets (70 mg total) by mouth daily with breakfast. Patient taking differently: Take 60 mg by mouth daily with breakfast.  03/29/20  Yes Fritzi Mandes, MD  vitamin B-12 1000 MCG tablet Take 1 tablet (1,000 mcg total) by mouth daily. 03/28/20  Yes Fritzi Mandes, MD  cloNIDine (CATAPRES) 0.1 MG tablet Take 1 tablet (0.1 mg total) by mouth 3 (three) times daily. Patient taking differently: Take 0.1 mg by mouth 2 (two) times daily. Take and extra one if >160 if needed 05/01/17 03/16/20  Delman Kitten, MD    Physical Exam: Vitals:   04/11/20 1010 04/11/20 1012  BP:  (!) 200/83  Pulse: 77   Resp: 20   Temp:  98.1 F (36.7 C)  TempSrc:  Oral  SpO2:  93%  Weight: 65.8 kg   Height: 4\' 11"  (1.499 m)      Vitals:   04/11/20 1010 04/11/20 1012  BP:  (!) 200/83  Pulse: 77   Resp: 20   Temp:  98.1 F (36.7 C)  TempSrc:  Oral  SpO2:  93%  Weight: 65.8 kg   Height: 4\' 11"  (1.499 m)     Constitutional: NAD, alert and oriented x 3 Eyes: PERRL, lids and conjunctivae normal, pale conjunctiva ENMT: Mucous membranes are moist.  Neck: normal, supple, no masses, no thyromegaly Respiratory: clear to auscultation bilaterally, no wheezing, faint crackles. Normal respiratory effort. No accessory muscle use.  Cardiovascular: Regular rate and rhythm, no murmurs / rubs / gallops. 1+ extremity edema. 2+ pedal pulses. No carotid bruits.  Abdomen: no tenderness, no masses palpated. No hepatosplenomegaly. Bowel sounds positive.  Musculoskeletal: no clubbing / cyanosis. No joint deformity upper and lower extremities.  Skin: no rashes, lesions, ulcers.  Neurologic: No gross focal neurologic  deficit. Psychiatric: Normal mood and affect.   Labs on Admission: I have personally reviewed following labs and imaging studies  CBC: Recent Labs  Lab 04/11/20 1029  WBC 17.8*  HGB 7.9*  HCT 22.8*  MCV 86.4  PLT 017   Basic Metabolic Panel: Recent Labs  Lab 04/11/20 1029  NA 135  K 3.5  CL 101  CO2 23  GLUCOSE 139*  BUN 56*  CREATININE 2.15*  CALCIUM 8.8*   GFR: Estimated Creatinine Clearance: 18.1 mL/min (A) (by C-G formula based on SCr of 2.15 mg/dL (H)). Liver Function Tests: No results for input(s): AST, ALT, ALKPHOS, BILITOT, PROT, ALBUMIN in the last 168 hours. No results for input(s): LIPASE, AMYLASE in the last 168 hours. No results for input(s): AMMONIA in the last 168 hours. Coagulation Profile: No results for input(s): INR, PROTIME in the last 168 hours. Cardiac Enzymes: No results for input(s): CKTOTAL, CKMB,  CKMBINDEX, TROPONINI in the last 168 hours. BNP (last 3 results) No results for input(s): PROBNP in the last 8760 hours. HbA1C: No results for input(s): HGBA1C in the last 72 hours. CBG: No results for input(s): GLUCAP in the last 168 hours. Lipid Profile: No results for input(s): CHOL, HDL, LDLCALC, TRIG, CHOLHDL, LDLDIRECT in the last 72 hours. Thyroid Function Tests: No results for input(s): TSH, T4TOTAL, FREET4, T3FREE, THYROIDAB in the last 72 hours. Anemia Panel: No results for input(s): VITAMINB12, FOLATE, FERRITIN, TIBC, IRON, RETICCTPCT in the last 72 hours. Urine analysis:    Component Value Date/Time   COLORURINE YELLOW (A) 03/24/2020 1103   APPEARANCEUR CLEAR (A) 03/24/2020 1103   LABSPEC 1.010 03/24/2020 1103   PHURINE 5.0 03/24/2020 1103   GLUCOSEU NEGATIVE 03/24/2020 1103   HGBUR MODERATE (A) 03/24/2020 1103   BILIRUBINUR NEGATIVE 03/24/2020 1103   KETONESUR NEGATIVE 03/24/2020 1103   PROTEINUR 30 (A) 03/24/2020 1103   NITRITE NEGATIVE 03/24/2020 1103   LEUKOCYTESUR NEGATIVE 03/24/2020 1103    Radiological Exams on  Admission: DG Chest 2 View  Result Date: 04/11/2020 CLINICAL DATA:  Shortness of breath. EXAM: CHEST - 2 VIEW COMPARISON:  04/10/2020 FINDINGS: The heart is normal in size. Persistent and slightly more pronounced perihilar interstitial process likely noncardiogenic perihilar pulmonary edema. Symmetric bilateral infiltrates are also possible but less likely. No pleural effusions. The bony thorax is intact. IMPRESSION: Persistent and slightly more pronounced perihilar interstitial process, likely noncardiogenic perihilar pulmonary edema. Electronically Signed   By: Marijo Sanes M.D.   On: 04/11/2020 11:16   DG Chest 2 View  Result Date: 04/10/2020 CLINICAL DATA:  79 year old female with shortness of breath. EXAM: CHEST - 2 VIEW COMPARISON:  Chest radiograph dated 03/18/2020 FINDINGS: Bilateral patchy nodular and hazy opacities may represent edema or pneumonia. Clinical correlation is recommended. There is no pleural effusion pneumothorax. The cardiac silhouette is within limits. No acute osseous pathology. Degenerative changes of the spine and scoliosis. Atherosclerotic calcification of the aorta. IMPRESSION: Bilateral patchy nodular and hazy opacities may represent edema or pneumonia. Electronically Signed   By: Anner Crete M.D.   On: 04/10/2020 20:53   CT Chest Wo Contrast  Result Date: 04/11/2020 CLINICAL DATA:  Respiratory illness. EXAM: CT CHEST WITHOUT CONTRAST TECHNIQUE: Multidetector CT imaging of the chest was performed following the standard protocol without IV contrast. COMPARISON:  None. FINDINGS: Cardiovascular: Atherosclerosis of thoracic aorta is noted without aneurysm formation. Normal cardiac size. No pericardial effusion. Mediastinum/Nodes: No enlarged mediastinal or axillary lymph nodes. Thyroid gland, trachea, and esophagus demonstrate no significant findings. Lungs/Pleura: No pneumothorax or pleural effusion is noted. Diffuse reticular and ground-glass opacities are noted  throughout both lungs with peripheral or subpleural sparing. These findings are most consistent with interstitial lung disease, particularly cryptogenic organizing pneumonia. Upper Abdomen: No acute abnormality. Musculoskeletal: Moderate scoliosis of thoracic spine is noted. No acute abnormality is noted. IMPRESSION: 1. Diffuse reticular and ground-glass opacities are noted throughout both lungs with peripheral or subpleural sparing. These findings are most consistent with interstitial lung disease, particularly cryptogenic organizing pneumonia. 2. Moderate scoliosis of thoracic spine. Aortic Atherosclerosis (ICD10-I70.0). Electronically Signed   By: Marijo Conception M.D.   On: 04/11/2020 12:35    EKG: Independently reviewed.  Sinus rhythm  Assessment/Plan Principal Problem:   Interstitial lung disease (HCC) Active Problems:   Acute on chronic respiratory failure with hypoxia (HCC)   CKD stage 4 secondary to hypertension ([place])   History of anemia due to chronic kidney disease  Hypertensive urgency    Interstitial lung disease Patient with history of vasculitis who presents for evaluation of worsening shortness of breath She had a CT scan of the chest done without contrast which showed diffuse reticular and ground-glass opacities are noted throughout both lungs with peripheral or subpleural sparing. These findings aremost consistent with interstitial lung disease, particularly cryptogenic organizing pneumonia. 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  She lists her son is her healthcare power of attorney.  All questions and concerns have been addressed Disposition Plan: Back to previous home environment Consults called: Pulmonary, nephrology    Collier Bullock MD [facility]     04/11/2020, 12:52 PM

## 2020-04-11 NOTE — Consult Note (Signed)
Pulmonary Medicine          Date: 04/11/2020,   MRN# [id number] JUDE LINCK 05-31-1941     AdmissionWeight: 65.8 kg                 CurrentWeight: 65.8 kg   Referring physician Dr. Francine Graven   CHIEF COMPLAINT:   Acute hypoxemic respiratory failure with abnormal CT chest   HISTORY OF PRESENT ILLNESS   This is a pleasant 79 year old female with a history of essential hypertension, aortic stenosis dyslipidemia CKD 3, diastolic heart failure chronic pulmonary edema and shortness of breath, came into the hospital due to progressively worsening shortness of breath which was noted at the infusion center while getting rituximab.  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  Pulmonary consultation placed for additional evaluation and further management of interstitial lung disease/organizing pneumonia found on CT chest.   PAST MEDICAL HISTORY   Past Medical History:  Diagnosis Date  . Anxiety disorder   . Dermatitis   . 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 Heart murmur   . Hypercholesterolemia   . Hypertension   . Lymphadenitis   . Skin cancer   . Spontaneous ecchymoses   . Vitamin D deficiency      SURGICAL HISTORY   Past Surgical History:  Procedure Laterality Date  . CARPAL TUNNEL RELEASE     [facility]  . TONSILLECTOMY    . TRANSTHORACIC ECHOCARDIOGRAM  05/01/2015    EF 60-65%. Abnormal relaxation. Mild aortic stenosis (peak gradient 19 mmHg).     FAMILY HISTORY   Family History  Problem Relation Age of Onset  . Heart disease Father   . Heart failure Father      SOCIAL HISTORY   Social History   Tobacco Use  . Smoking status: Never Smoker  . Smokeless tobacco: Never Used  Substance Use Topics  . Alcohol use: No  . Drug use: No     MEDICATIONS    Home Medication:  Current Outpatient Rx  . Order #:  [id number] Class: Normal  . Order #: [id number] Class: Historical Med  . Order #: [id number] Class: Normal  . Order #: [id number] Class: Normal  . Order #: [id number] Class: Historical Med  . Order #: [id number] Class: Normal  . Order #: [id number] Class: Normal  . Order #: [id number] Class: Normal  . Order #: [id number] Class: Print    Current Medication:  Current Facility-Administered Medications:  .  0.9 %  sodium chloride infusion, 250 mL, Intravenous, PRN, Agbata, Tochukwu, MD .  acetaminophen (TYLENOL) tablet 650 mg, 650 mg, Oral, Q6H PRN **OR** acetaminophen (TYLENOL) suppository 650 mg, 650 mg, Rectal, Q6H PRN, Agbata, Tochukwu, MD .  clonazePAM (KLONOPIN) tablet 0.5 mg, 0.5 mg, Oral, TID PRN, Agbata, Tochukwu, MD .  cloNIDine (CATAPRES) tablet 0.1 mg, 0.1 mg, Oral, Once, Carrie Mew, MD, Stopped at 04/11/20 1248 .  cloNIDine (CATAPRES) tablet 0.1 mg, 0.1 mg, Oral, TID, Agbata, Tochukwu, MD .  feeding supplement (NEPRO CARB STEADY) liquid 237 mL, 237 mL, Oral, Q24H, Agbata, Tochukwu, MD .  heparin injection 5,000 Units, 5,000 Units, Subcutaneous, Q8H, Agbata, Tochukwu, MD, 5,000 Units at 04/11/20 1538 .  hydrALAZINE (APRESOLINE) tablet 50 mg, 50 mg, Oral, BID, Agbata, Tochukwu, MD, Stopped at 04/11/20 1536 .  ipratropium-albuterol (DUONEB) 0.5-2.5 (3) MG/3ML nebulizer solution 3 mL, 3 mL, Nebulization, Q6H PRN, Agbata, Tochukwu, MD .  Derrill Memo ON 04/12/2020] iron polysaccharides (  NIFEREX) capsule 150 mg, 150 mg, Oral, Daily, Agbata, Tochukwu, MD .  Derrill Memo ON 04/12/2020] lisinopril (ZESTRIL) tablet 40 mg, 40 mg, Oral, Daily, Agbata, Tochukwu, MD .  ondansetron (ZOFRAN) tablet 4 mg, 4 mg, Oral, Q6H PRN **OR** ondansetron (ZOFRAN) injection 4 mg, 4 mg, Intravenous, Q6H PRN, Agbata, Tochukwu, MD .  Derrill Memo ON 04/12/2020] predniSONE (DELTASONE) tablet 60 mg, 60 mg, Oral, Q breakfast, Agbata, Tochukwu, MD .  sodium chloride flush (NS) 0.9 % injection 3 mL, 3 mL, Intravenous, Q12H, Agbata, Tochukwu, MD .   sodium chloride flush (NS) 0.9 % injection 3 mL, 3 mL, Intravenous, PRN, Agbata, Tochukwu, MD .  Derrill Memo ON 04/12/2020] vitamin B-12 (CYANOCOBALAMIN) tablet 1,000 mcg, 1,000 mcg, Oral, Daily, Agbata, Tochukwu, MD  Current Outpatient Medications:  .  atenolol (TENORMIN) 25 MG tablet, Take 1 tablet (25 mg total) by mouth daily., Disp: 60 tablet, Rfl: 0 .  clonazePAM (KLONOPIN) 0.5 MG tablet, Take 0.5 mg by mouth 3 (three) times daily as needed for anxiety. , Disp: , Rfl:  .  hydrALAZINE (APRESOLINE) 50 MG tablet, Take 1 tablet (50 mg total) by mouth in the morning and at bedtime., Disp: 60 tablet, Rfl: 0 .  iron polysaccharides (NIFEREX) 150 MG capsule, Take 1 capsule (150 mg total) by mouth daily., Disp: 30 capsule, Rfl: 0 .  lisinopril (ZESTRIL) 40 MG tablet, Take 40 mg by mouth daily., Disp: , Rfl:  .  Nutritional Supplements (FEEDING SUPPLEMENT, NEPRO CARB STEADY,) LIQD, Take 237 mLs by mouth daily., Disp: 237 mL, Rfl: 0 .  predniSONE (DELTASONE) 10 MG tablet, Take 7 tablets (70 mg total) by mouth daily with breakfast. (Patient taking differently: Take 60 mg by mouth daily with breakfast. ), Disp: 100 tablet, Rfl: 0 .  vitamin B-12 1000 MCG tablet, Take 1 tablet (1,000 mcg total) by mouth daily., Disp: 30 tablet, Rfl: 0 .  cloNIDine (CATAPRES) 0.1 MG tablet, Take 1 tablet (0.1 mg total) by mouth 3 (three) times daily. (Patient taking differently: Take 0.1 mg by mouth 2 (two) times daily. Take and extra one if >160 if needed), Disp: 30 tablet, Rfl: 0    ALLERGIES   Amlodipine, Aspirin, Chlorthalidone, Coreg [carvedilol], and Statins     REVIEW OF SYSTEMS    Review of Systems:  Gen:  Denies  fever, sweats, chills weigh loss  HEENT: Denies blurred vision, double vision, ear pain, eye pain, hearing loss, nose bleeds, sore throat Cardiac:  No dizziness, chest pain or heaviness, chest tightness,edema Resp:   Denies cough or sputum porduction, shortness of breath,wheezing, hemoptysis,  Gi:  Denies swallowing difficulty, stomach pain, nausea or vomiting, diarrhea, constipation, bowel incontinence Gu:  Denies bladder incontinence, burning urine Ext:   Denies Joint pain, stiffness or swelling Skin: Denies  skin rash, easy bruising or bleeding or hives Endoc:  Denies polyuria, polydipsia , polyphagia or weight change Psych:   Denies depression, insomnia or hallucinations   Other:  All other systems negative   VS: BP (!) 153/78 (BP Location: Right Arm)   Pulse 84   Temp 97.7 F (36.5 C) (Oral)   Resp 20   Ht 4\' 11"  (1.499 m)   Wt 65.8 kg   SpO2 96%   BMI 29.29 kg/m      PHYSICAL EXAM    GENERAL:NAD, no fevers, chills, no weakness no fatigue HEAD: Normocephalic, atraumatic.  EYES: Pupils equal, round, reactive to light. Extraocular muscles intact. No scleral icterus.  MOUTH: Moist mucosal membrane. Dentition intact. No abscess noted.  EAR, NOSE, THROAT: Clear without exudates. No external lesions.  NECK: Supple. No thyromegaly. No nodules. No JVD.  PULMONARY: Diffuse coarse rhonchi right sided +wheezes CARDIOVASCULAR: S1 and S2. Regular rate and rhythm. No murmurs, rubs, or gallops. No edema. Pedal pulses 2+ bilaterally.  GASTROINTESTINAL: Soft, nontender, nondistended. No masses. Positive bowel sounds. No hepatosplenomegaly.  MUSCULOSKELETAL: No swelling, clubbing, or edema. Range of motion full in all extremities.  NEUROLOGIC: Cranial nerves II through XII are intact. No gross focal neurological deficits. Sensation intact. Reflexes intact.  SKIN: No ulceration, lesions, rashes, or cyanosis. Skin warm and dry. Turgor intact.  PSYCHIATRIC: Mood, affect within normal limits. The patient is awake, alert and oriented x 3. Insight, judgment intact.       IMAGING    DG Chest 1 View  Result Date: 03/18/2020 CLINICAL DATA:  Acute tubular necrosis, history of shortness of breath EXAM: CHEST  1 VIEW COMPARISON:  03/16/2020 FINDINGS: Single frontal view of the chest  demonstrates a stable cardiac silhouette. There is persistent central vascular congestion and interstitial prominence. New left basilar consolidation and small left effusion. No pneumothorax. IMPRESSION: 1. Progressive pulmonary edema, with new left pleural effusion. Electronically Signed   By: Randa Ngo M.D.   On: 03/18/2020 19:49   DG Chest 2 View  Result Date: 04/11/2020 CLINICAL DATA:  Shortness of breath. EXAM: CHEST - 2 VIEW COMPARISON:  04/10/2020 FINDINGS: The heart is normal in size. Persistent and slightly more pronounced perihilar interstitial process likely noncardiogenic perihilar pulmonary edema. Symmetric bilateral infiltrates are also possible but less likely. No pleural effusions. The bony thorax is intact. IMPRESSION: Persistent and slightly more pronounced perihilar interstitial process, likely noncardiogenic perihilar pulmonary edema. Electronically Signed   By: Marijo Sanes M.D.   On: 04/11/2020 11:16   DG Chest 2 View  Result Date: 04/10/2020 CLINICAL DATA:  79 year old female with shortness of breath. EXAM: CHEST - 2 VIEW COMPARISON:  Chest radiograph dated 03/18/2020 FINDINGS: Bilateral patchy nodular and hazy opacities may represent edema or pneumonia. Clinical correlation is recommended. There is no pleural effusion pneumothorax. The cardiac silhouette is within limits. No acute osseous pathology. Degenerative changes of the spine and scoliosis. Atherosclerotic calcification of the aorta. IMPRESSION: Bilateral patchy nodular and hazy opacities may represent edema or pneumonia. Electronically Signed   By: Anner Crete M.D.   On: 04/10/2020 20:53   DG Chest 2 View  Result Date: 03/16/2020 CLINICAL DATA:  Shortness of breath. EXAM: CHEST - 2 VIEW COMPARISON:  Radiograph 05/01/2017 FINDINGS: Borderline cardiomegaly. Unchanged mediastinal contours. Small bilateral pleural effusions with fluid in the fissures. Interstitial opacities consistent with pulmonary edema with mild  Kerley B-lines. No confluent airspace disease. No pneumothorax. Scoliotic curvature in the spine with associated degenerative change. Aortic atherosclerosis. IMPRESSION: Findings consistent with CHF. Pulmonary edema with small pleural effusions. Aortic Atherosclerosis (ICD10-I70.0). Electronically Signed   By: Keith Rake M.D.   On: 03/16/2020 15:59   CT HEAD WO CONTRAST  Result Date: 03/16/2020 CLINICAL DATA:  Shortness of breath, chills and posterior headache. EXAM: CT HEAD WITHOUT CONTRAST TECHNIQUE: Contiguous axial images were obtained from the base of the skull through the vertex without intravenous contrast. COMPARISON:  None. FINDINGS: Brain: There is mild cerebral atrophy with widening of the extra-axial spaces and ventricular dilatation. There are areas of decreased attenuation within the white matter tracts of the supratentorial brain, consistent with microvascular disease changes. Vascular: No hyperdense vessel or unexpected calcification. Skull: Normal. Negative for fracture or focal lesion. Sinuses/Orbits: No acute  finding. Other: None. IMPRESSION: 1. Generalized cerebral atrophy. 2. No acute intracranial abnormality. Electronically Signed   By: Virgina Norfolk M.D.   On: 03/16/2020 17:46   CT Chest Wo Contrast  Result Date: 04/11/2020 CLINICAL DATA:  Respiratory illness. EXAM: CT CHEST WITHOUT CONTRAST TECHNIQUE: Multidetector CT imaging of the chest was performed following the standard protocol without IV contrast. COMPARISON:  None. FINDINGS: Cardiovascular: Atherosclerosis of thoracic aorta is noted without aneurysm formation. Normal cardiac size. No pericardial effusion. Mediastinum/Nodes: No enlarged mediastinal or axillary lymph nodes. Thyroid gland, trachea, and esophagus demonstrate no significant findings. Lungs/Pleura: No pneumothorax or pleural effusion is noted. Diffuse reticular and ground-glass opacities are noted throughout both lungs with peripheral or subpleural  sparing. These findings are most consistent with interstitial lung disease, particularly cryptogenic organizing pneumonia. Upper Abdomen: No acute abnormality. Musculoskeletal: Moderate scoliosis of thoracic spine is noted. No acute abnormality is noted. IMPRESSION: 1. Diffuse reticular and ground-glass opacities are noted throughout both lungs with peripheral or subpleural sparing. These findings are most consistent with interstitial lung disease, particularly cryptogenic organizing pneumonia. 2. Moderate scoliosis of thoracic spine. Aortic Atherosclerosis (ICD10-I70.0). Electronically Signed   By: Marijo Conception M.D.   On: 04/11/2020 12:35   US RENAL  Result Date: 03/18/2020 CLINICAL DATA:  Acute tubular necrosis EXAM: RENAL / URINARY TRACT ULTRASOUND COMPLETE COMPARISON:  None. FINDINGS: Right Kidney: Renal measurements: 9 x 5.1 x 4.9 cm = volume: 117 mL. There is increased echotexture of right kidney. No focal mass or hydronephrosis is identified. Left Kidney: Renal measurements: 11.1 x 5.1 x 4.2 cm = volume: 126 mL. Echogenicity within normal limits. No mass or hydronephrosis visualized. Bladder: Appears normal for degree of bladder distention. Other: None. IMPRESSION: Increased echotexture of right kidney.  Otherwise negative. Electronically Signed   By: Abelardo Diesel M.D.   On: 03/18/2020 15:54   ECHOCARDIOGRAM COMPLETE  Result Date: 03/17/2020    ECHOCARDIOGRAM REPORT   Patient Name:   MAKAIA RAPPA Date of Exam: 03/17/2020 Medical Rec #:  [id number]       Height:       59.0 in Accession #:    [id number]      Weight:       158.7 lb Date of Birth:  January 03, 1941      BSA:          1.672 m Patient Age:    4 years        BP:           120/59 mmHg Patient Gender: F               HR:           98 bpm. Exam Location:  [facility] Procedure: 2D Echo, Color Doppler and Cardiac Doppler Indications:     R06.00 Dyspnea  History:         Patient has prior history of Echocardiogram examinations.                   Signs/Symptoms:Murmur; Risk Factors:Hypertension and HCL.  Sonographer:     Charmayne Sheer RDCS (AE) Referring Phys:  [id number] BRENDA MORRISON Diagnosing Phys: Isaias Cowman MD IMPRESSIONS  1. Left ventricular ejection fraction, by estimation, is 55 to 60%. The left ventricle has normal function. The left ventricle has no regional wall motion abnormalities. Left ventricular diastolic parameters are consistent with Grade I diastolic dysfunction (impaired relaxation).  2. Right ventricular systolic function is normal. The right ventricular size is normal.  3.  The mitral valve is normal in structure. Mild mitral valve regurgitation. No evidence of mitral stenosis.  4. The aortic valve is normal in structure. Aortic valve regurgitation is mild to moderate. 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  5. The inferior vena cava is normal in size with greater than 50% respiratory variability, suggesting right atrial pressure of 3 mmHg. FINDINGS  Left Ventricle: Left ventricular ejection fraction, by estimation, is 55 to 60%. The left ventricle has normal function. The left ventricle has no regional wall motion abnormalities. The left ventricular internal cavity size was normal in size. There is  no left ventricular hypertrophy. Left ventricular diastolic parameters are consistent with Grade I diastolic dysfunction (impaired relaxation). Right Ventricle: The right ventricular size is normal. No increase in right ventricular wall thickness. Right ventricular systolic function is normal. Left Atrium: Left atrial size was normal in size. Right Atrium: Right atrial size was normal in size. Pericardium: There is no evidence of pericardial effusion. Mitral Valve: The mitral valve is normal in structure. Normal mobility of the mitral valve leaflets. Mild mitral valve regurgitation. No evidence of mitral valve stenosis. MV peak gradient, 7.4 mmHg. The mean mitral valve gradient is 3.0 mmHg. Tricuspid Valve: The tricuspid valve is  normal in structure. Tricuspid valve regurgitation is mild . No evidence of tricuspid stenosis. Aortic Valve: The aortic valve is normal in structure. Aortic valve regurgitation is mild to moderate. Moderate to severe aortic stenosis is present. Aortic valve mean gradient measures 24.5 mmHg. Aortic valve peak gradient measures 43.2 mmHg. Aortic valve area, by VTI measures 0.99 cm. Pulmonic Valve: The pulmonic valve was normal in structure. Pulmonic valve regurgitation is not visualized. No evidence of pulmonic stenosis. Aorta: The aortic root is normal in size and structure. Venous: The inferior vena cava is normal in size with greater than 50% respiratory variability, suggesting right atrial pressure of 3 mmHg. IAS/Shunts: No atrial level shunt detected by color flow Doppler.  LEFT VENTRICLE PLAX 2D LVIDd:         2.76 cm     Diastology LVIDs:         2.18 cm     LV e' lateral:   7.83 cm/s LV PW:         0.94 cm     LV E/e' lateral: 11.0 LV IVS:        0.92 cm     LV e' medial:    4.79 cm/s LVOT diam:     1.70 cm     LV E/e' medial:  17.9 LV SV:         61 LV SV Index:   37 LVOT Area:     2.27 cm  LV Volumes (MOD) LV vol d, MOD A2C: 45.9 ml LV vol d, MOD A4C: 53.5 ml LV vol s, MOD A2C: 18.3 ml LV vol s, MOD A4C: 20.8 ml LV SV MOD A2C:     27.6 ml LV SV MOD A4C:     53.5 ml LV SV MOD BP:      29.5 ml RIGHT VENTRICLE RV Basal diam:  2.94 cm LEFT ATRIUM             Index       RIGHT ATRIUM          Index LA diam:        3.00 cm 1.79 cm/m  RA Area:     7.50 cm LA Vol (A2C):   17.4 ml 10.41 ml/m RA Volume:  11.10 ml 6.64 ml/m LA Vol (A4C):   34.0 ml 20.34 ml/m LA Biplane Vol: 26.4 ml 15.79 ml/m  AORTIC VALVE                    PULMONIC VALVE AV Area (Vmax):    0.85 cm     PV Vmax:       1.79 m/s AV Area (Vmean):   0.84 cm     PV Vmean:      119.000 cm/s AV Area (VTI):     0.99 cm     PV VTI:        0.339 m AV Vmax:           328.50 cm/s  PV Peak grad:  12.8 mmHg AV Vmean:          230.000 cm/s PV Mean grad:   7.0 mmHg AV VTI:            0.619 m AV Peak Grad:      43.2 mmHg AV Mean Grad:      24.5 mmHg LVOT Vmax:         123.00 cm/s LVOT Vmean:        85.600 cm/s LVOT VTI:          0.270 m LVOT/AV VTI ratio: 0.44  AORTA Ao Root diam: 3.00 cm MITRAL VALVE MV Area (PHT): 3.85 cm     SHUNTS MV Peak grad:  7.4 mmHg     Systemic VTI:  0.27 m MV Mean grad:  3.0 mmHg     Systemic Diam: 1.70 cm MV Vmax:       1.36 m/s MV Vmean:      85.3 cm/s MV Decel Time: 197 msec MV E velocity: 85.90 cm/s MV A velocity: 125.00 cm/s MV E/A ratio:  0.69 Isaias Cowman MD Electronically signed by Isaias Cowman MD Signature Date/Time: 03/17/2020/3:42:25 PM    Final    US BIOPSY (KIDNEY)  Result Date: 03/23/2020 INDICATION: 79 year old female with a history of acute kidney injury EXAM: IMAGE GUIDED RENAL BIOPSY MEDICATIONS: None. ANESTHESIA/SEDATION: Moderate (conscious) sedation was employed during this procedure. A total of Versed 1.0 mg and Fentanyl 50 mcg was administered intravenously. Moderate Sedation Time: 11 minutes. The patient's level of consciousness and vital signs were monitored continuously by radiology nursing throughout the procedure under my direct supervision. FLUOROSCOPY TIME:  Ultrasound COMPLICATIONS: None PROCEDURE: Informed written consent was obtained from the patient after a thorough discussion of the procedural risks, benefits and alternatives. All questions were addressed. Maximal Sterile Barrier Technique was utilized including caps, mask, sterile gowns, sterile gloves, sterile drape, hand hygiene and skin antiseptic. A timeout was performed prior to the initiation of the procedure. Patient was positioned prone position on the gantry table. Images were stored sent to PACs. Once the patient is prepped and draped in the usual sterile fashion, the skin and subcutaneous tissues overlying the left kidney were generously infiltrated 1% lidocaine for local anesthesia. Using ultrasound guidance, transverse  approach, a 15 gauge guide needle was advanced into the lateral cortex of the left kidney. Once we confirmed location of the needle tip, 4 separate 16 gauge core biopsy were achieved. Two Gel-Foam pledgets were infused with a small amount of saline. The needle was removed. Final images were stored. The patient tolerated the procedure well and remained hemodynamically stable throughout. No complications were encountered and no significant blood loss encountered. IMPRESSION: Status post ultrasound-guided medical kidney biopsy of the left kidney. Signed, York Cerise  Pasty Arch, DO, RPVI [facility] Radiology Electronically Signed   By: Corrie Mckusick D.O.   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   Patient/Family are satisfied with care plan and all questions have been answered.  This document was prepared using Dragon voice recognition software and may include unintentional dictation errors.     Ottie Glazier, M.D.  Division of [place]

## 2020-04-12 ENCOUNTER — Encounter: Payer: Self-pay | Admitting: Internal Medicine

## 2020-04-12 LAB — CBC
HCT: 19.3 % — ABNORMAL LOW (ref 36.0–46.0)
HCT: 22 % — ABNORMAL LOW (ref 36.0–46.0)
Hemoglobin: 6.5 g/dL — ABNORMAL LOW (ref 12.0–15.0)
Hemoglobin: 7.4 g/dL — ABNORMAL LOW (ref 12.0–15.0)
MCH: 30 pg (ref 26.0–34.0)
MCH: 29.7 pg (ref 26.0–34.0)
MCHC: 33.7 g/dL (ref 30.0–36.0)
MCHC: 33.6 g/dL (ref 30.0–36.0)
MCV: 88.9 fL (ref 80.0–100.0)
MCV: 88.4 fL (ref 80.0–100.0)
Platelets: 187 10*3/uL (ref 150–400)
Platelets: 197 10*3/uL (ref 150–400)
RBC: 2.17 MIL/uL — ABNORMAL LOW (ref 3.87–5.11)
RBC: 2.49 MIL/uL — ABNORMAL LOW (ref 3.87–5.11)
RDW: 16.8 % — ABNORMAL HIGH (ref 11.5–15.5)
RDW: 16.7 % — ABNORMAL HIGH (ref 11.5–15.5)
WBC: 13.8 10*3/uL — ABNORMAL HIGH (ref 4.0–10.5)
WBC: 13.8 10*3/uL — ABNORMAL HIGH (ref 4.0–10.5)
nRBC: 0 % (ref 0.0–0.2)
nRBC: 0 % (ref 0.0–0.2)

## 2020-04-12 LAB — BASIC METABOLIC PANEL
Anion gap: 9 (ref 5–15)
BUN: 58 mg/dL — ABNORMAL HIGH (ref 8–23)
CO2: 29 mmol/L (ref 22–32)
Calcium: 8.3 mg/dL — ABNORMAL LOW (ref 8.9–10.3)
Chloride: 101 mmol/L (ref 98–111)
Creatinine, Ser: 2.21 mg/dL — ABNORMAL HIGH (ref 0.44–1.00)
GFR calc Af Amer: 24 mL/min — ABNORMAL LOW (ref >60)
GFR calc non Af Amer: 21 mL/min — ABNORMAL LOW (ref >60)
Glucose, Bld: 93 mg/dL (ref 70–99)
Potassium: 3.5 mmol/L (ref 3.5–5.1)
Sodium: 139 mmol/L (ref 135–145)

## 2020-04-12 LAB — HEMOGLOBIN AND HEMATOCRIT, BLOOD
HCT: 23.5 % — ABNORMAL LOW (ref 36.0–46.0)
Hemoglobin: 7.9 g/dL — ABNORMAL LOW (ref 12.0–15.0)

## 2020-04-12 LAB — HIV ANTIBODY (ROUTINE TESTING W REFLEX): HIV Screen 4th Generation wRfx: NONREACTIVE

## 2020-04-12 LAB — PREPARE RBC (CROSSMATCH)

## 2020-04-12 MED ORDER — PREDNISONE 50 MG PO TABS: 50.0000 mg | ORAL_TABLET | Freq: Every day | ORAL | Status: DC

## 2020-04-12 MED ORDER — SODIUM CHLORIDE 0.9% IV SOLUTION: Freq: Once | INTRAVENOUS | Status: AC

## 2020-04-12 MED ORDER — HYDRALAZINE HCL 20 MG/ML IJ SOLN
5.0000 mg | Freq: Four times a day (QID) | INTRAMUSCULAR | Status: DC | PRN
  Administered 2020-04-12: 5 mg via INTRAVENOUS
  Filled 2020-04-12: qty 1

## 2020-04-12 MED ORDER — PREDNISONE 50 MG PO TABS
50.0000 mg | ORAL_TABLET | Freq: Every day | ORAL | Status: DC
  Administered 2020-04-12 – 2020-04-13 (×2): 50 mg via ORAL
  Filled 2020-04-12 (×2): qty 1

## 2020-04-12 MED ORDER — LOSARTAN POTASSIUM 50 MG PO TABS
50.0000 mg | ORAL_TABLET | Freq: Every day | ORAL | Status: DC
  Administered 2020-04-12 – 2020-04-13 (×2): 50 mg via ORAL
  Filled 2020-04-12 (×2): qty 1

## 2020-04-12 MED ORDER — EPOETIN ALFA 40000 UNIT/ML IJ SOLN
20000.0000 [IU] | INTRAMUSCULAR | Status: DC
  Administered 2020-04-13: 16:00:00 20000 [IU] via SUBCUTANEOUS
  Filled 2020-04-12 (×2): qty 1

## 2020-04-12 MED ORDER — SULFAMETHOXAZOLE-TRIMETHOPRIM 400-80 MG PO TABS
1.0000 | ORAL_TABLET | ORAL | Status: DC
  Administered 2020-04-14: 19:00:00 1 via ORAL
  Filled 2020-04-12: qty 1

## 2020-04-12 MED ORDER — ATENOLOL 25 MG PO TABS
25.0000 mg | ORAL_TABLET | Freq: Every day | ORAL | Status: DC
  Administered 2020-04-12 – 2020-04-14 (×3): 25 mg via ORAL
  Filled 2020-04-12 (×3): qty 1

## 2020-04-12 MED ORDER — SULFAMETHOXAZOLE-TRIMETHOPRIM 400-80 MG PO TABS
1.0000 | ORAL_TABLET | Freq: Two times a day (BID) | ORAL | Status: DC
  Administered 2020-04-12: 12:00:00 1 via ORAL
  Filled 2020-04-12 (×2): qty 1

## 2020-04-12 MED ORDER — LABETALOL HCL 5 MG/ML IV SOLN
5.0000 mg | INTRAVENOUS | Status: DC | PRN
  Administered 2020-04-12: 5 mg via INTRAVENOUS
  Filled 2020-04-12: qty 4

## 2020-04-12 NOTE — Consult Note (Addendum)
Pulmonary Progress Note          Date: 04/12/2020,   MRN# [id number] Linda Bradley 1941-07-21     AdmissionWeight: 65.8 kg                 CurrentWeight: 64.2 kg   Referring physician Dr. Francine Graven   CHIEF COMPLAINT:   Acute hypoxemic respiratory failure with abnormal CT chest   SUBJECTIVE   Patient is clinically improved, she is smiling this am and is in good spirits.  She reports breathing is improved and has been weaned to 3L/min [place]   PAST MEDICAL HISTORY   Past Medical History:  Diagnosis Date  . Anxiety disorder   . Dermatitis   . 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 Heart murmur   . Hypercholesterolemia   . Hypertension   . Lymphadenitis   . Skin cancer   . Spontaneous ecchymoses   . Vitamin D deficiency      SURGICAL HISTORY   Past Surgical History:  Procedure Laterality Date  . CARPAL TUNNEL RELEASE     [facility]  . TONSILLECTOMY    . TRANSTHORACIC ECHOCARDIOGRAM  05/01/2015    EF 60-65%. Abnormal relaxation. Mild aortic stenosis (peak gradient 19 mmHg).     FAMILY HISTORY   Family History  Problem Relation Age of Onset  . Heart disease Father   . Heart failure Father      SOCIAL HISTORY   Social History   Tobacco Use  . Smoking status: Never Smoker  . Smokeless tobacco: Never Used  Substance Use Topics  . Alcohol use: No  . Drug use: No     MEDICATIONS    Home Medication:    Current Medication:  Current Facility-Administered Medications:  .  0.9 %  sodium chloride infusion (Manually program via Guardrails IV Fluids), , Intravenous, Once, [place], [employer], NP .  0.9 %  sodium chloride infusion, 250 mL, Intravenous, PRN, Agbata, Tochukwu, MD .  acetaminophen (TYLENOL) tablet 650 mg, 650 mg, Oral, Q6H PRN **OR** acetaminophen (TYLENOL) suppository 650 mg, 650 mg, Rectal, Q6H PRN, Agbata, Tochukwu, MD .  clonazePAM (KLONOPIN) tablet 0.5 mg, 0.5 mg, Oral, TID PRN, Agbata, Tochukwu, MD, 0.5 mg at  04/11/20 2000 .  cloNIDine (CATAPRES) tablet 0.1 mg, 0.1 mg, Oral, Once, Carrie Mew, MD, Stopped at 04/11/20 1248 .  cloNIDine (CATAPRES) tablet 0.1 mg, 0.1 mg, Oral, TID, Agbata, Tochukwu, MD, 0.1 mg at 04/12/20 0913 .  feeding supplement (NEPRO CARB STEADY) liquid 237 mL, 237 mL, Oral, Q24H, Agbata, Tochukwu, MD .  heparin injection 5,000 Units, 5,000 Units, Subcutaneous, Q8H, Agbata, Tochukwu, MD, 5,000 Units at 04/12/20 0603 .  hydrALAZINE (APRESOLINE) injection 5 mg, 5 mg, Intravenous, Q6H PRN, Lang Snow, NP, 5 mg at 04/12/20 0015 .  hydrALAZINE (APRESOLINE) tablet 50 mg, 50 mg, Oral, BID, Agbata, Tochukwu, MD, 50 mg at 04/12/20 0913 .  ipratropium-albuterol (DUONEB) 0.5-2.5 (3) MG/3ML nebulizer solution 3 mL, 3 mL, Nebulization, Q6H PRN, Agbata, Tochukwu, MD .  iron polysaccharides (NIFEREX) capsule 150 mg, 150 mg, Oral, Daily, Agbata, Tochukwu, MD .  lisinopril (ZESTRIL) tablet 40 mg, 40 mg, Oral, Daily, Agbata, Tochukwu, MD .  ondansetron (ZOFRAN) tablet 4 mg, 4 mg, Oral, Q6H PRN **OR** ondansetron (ZOFRAN) injection 4 mg, 4 mg, Intravenous, Q6H PRN, Agbata, Tochukwu, MD .  Derrill Memo ON 04/13/2020] predniSONE (DELTASONE) tablet 50 mg, 50 mg, Oral, Q breakfast, Nakina Spatz, MD .  sodium chloride flush (NS) 0.9 % injection 3  mL, 3 mL, Intravenous, Q12H, Agbata, Tochukwu, MD, 3 mL at 04/12/20 0916 .  sodium chloride flush (NS) 0.9 % injection 3 mL, 3 mL, Intravenous, PRN, Agbata, Tochukwu, MD .  sulfamethoxazole-trimethoprim (BACTRIM) 400-80 MG per tablet 1 tablet, 1 tablet, Oral, Q12H, Lanney Gins, Adiva Boettner, MD .  vitamin B-12 (CYANOCOBALAMIN) tablet 1,000 mcg, 1,000 mcg, Oral, Daily, Agbata, Tochukwu, MD, 1,000 mcg at 04/12/20 0915    ALLERGIES   Amlodipine, Aspirin, Chlorthalidone, Coreg [carvedilol], and Statins     REVIEW OF SYSTEMS    Review of Systems:  Gen:  Denies  fever, sweats, chills weigh loss  HEENT: Denies blurred vision, double vision, ear pain, eye  pain, hearing loss, nose bleeds, sore throat Cardiac:  No dizziness, chest pain or heaviness, chest tightness,edema Resp:   Reports SOB Gi: Denies swallowing difficulty, stomach pain, nausea or vomiting, diarrhea, constipation, bowel incontinence Gu:  Denies bladder incontinence, burning urine Ext:   Denies Joint pain, stiffness or swelling Skin: Denies  skin rash, easy bruising or bleeding or hives Endoc:  Denies polyuria, polydipsia , polyphagia or weight change Psych:   Denies depression, insomnia or hallucinations   Other:  All other systems negative   VS: BP (!) 167/64 (BP Location: Right Arm)   Pulse 88   Temp 98.3 F (36.8 C) (Oral)   Resp 18   Ht 4\' 11"  (1.499 m)   Wt 64.2 kg   SpO2 93%   BMI 28.60 kg/m      PHYSICAL EXAM    GENERAL:NAD, no fevers, chills, no weakness no fatigue HEAD: Normocephalic, atraumatic.  EYES: Pupils equal, round, reactive to light. Extraocular muscles intact. No scleral icterus.  MOUTH: Moist mucosal membrane. Dentition intact. No abscess noted.  EAR, NOSE, THROAT: Clear without exudates. No external lesions.  NECK: Supple. No thyromegaly. No nodules. No JVD.  PULMONARY: mild rhonchorous breath sounds bilaterally CARDIOVASCULAR: S1 and S2. Regular rate and rhythm. No murmurs, rubs, or gallops. No edema. Pedal pulses 2+ bilaterally.  GASTROINTESTINAL: Soft, nontender, nondistended. No masses. Positive bowel sounds. No hepatosplenomegaly.  MUSCULOSKELETAL: No swelling, clubbing, or edema. Range of motion full in all extremities.  NEUROLOGIC: Cranial nerves II through XII are intact. No gross focal neurological deficits. Sensation intact. Reflexes intact.  SKIN: No ulceration, lesions, rashes, or cyanosis. Skin warm and dry. Turgor intact.  PSYCHIATRIC: Mood, affect within normal limits. The patient is awake, alert and oriented x 3. Insight, judgment intact.       IMAGING    DG Chest 1 View  Result Date: 03/18/2020 CLINICAL DATA:   Acute tubular necrosis, history of shortness of breath EXAM: CHEST  1 VIEW COMPARISON:  03/16/2020 FINDINGS: Single frontal view of the chest demonstrates a stable cardiac silhouette. There is persistent central vascular congestion and interstitial prominence. New left basilar consolidation and small left effusion. No pneumothorax. IMPRESSION: 1. Progressive pulmonary edema, with new left pleural effusion. Electronically Signed   By: Randa Ngo M.D.   On: 03/18/2020 19:49   DG Chest 2 View  Result Date: 04/11/2020 CLINICAL DATA:  Shortness of breath. EXAM: CHEST - 2 VIEW COMPARISON:  04/10/2020 FINDINGS: The heart is normal in size. Persistent and slightly more pronounced perihilar interstitial process likely noncardiogenic perihilar pulmonary edema. Symmetric bilateral infiltrates are also possible but less likely. No pleural effusions. The bony thorax is intact. IMPRESSION: Persistent and slightly more pronounced perihilar interstitial process, likely noncardiogenic perihilar pulmonary edema. Electronically Signed   By: Marijo Sanes M.D.   On: 04/11/2020 11:16  DG Chest 2 View  Result Date: 04/10/2020 CLINICAL DATA:  79 year old female with shortness of breath. EXAM: CHEST - 2 VIEW COMPARISON:  Chest radiograph dated 03/18/2020 FINDINGS: Bilateral patchy nodular and hazy opacities may represent edema or pneumonia. Clinical correlation is recommended. There is no pleural effusion pneumothorax. The cardiac silhouette is within limits. No acute osseous pathology. Degenerative changes of the spine and scoliosis. Atherosclerotic calcification of the aorta. IMPRESSION: Bilateral patchy nodular and hazy opacities may represent edema or pneumonia. Electronically Signed   By: Anner Crete M.D.   On: 04/10/2020 20:53   DG Chest 2 View  Result Date: 03/16/2020 CLINICAL DATA:  Shortness of breath. EXAM: CHEST - 2 VIEW COMPARISON:  Radiograph 05/01/2017 FINDINGS: Borderline cardiomegaly. Unchanged  mediastinal contours. Small bilateral pleural effusions with fluid in the fissures. Interstitial opacities consistent with pulmonary edema with mild Kerley B-lines. No confluent airspace disease. No pneumothorax. Scoliotic curvature in the spine with associated degenerative change. Aortic atherosclerosis. IMPRESSION: Findings consistent with CHF. Pulmonary edema with small pleural effusions. Aortic Atherosclerosis (ICD10-I70.0). Electronically Signed   By: Keith Rake M.D.   On: 03/16/2020 15:59   CT HEAD WO CONTRAST  Result Date: 03/16/2020 CLINICAL DATA:  Shortness of breath, chills and posterior headache. EXAM: CT HEAD WITHOUT CONTRAST TECHNIQUE: Contiguous axial images were obtained from the base of the skull through the vertex without intravenous contrast. COMPARISON:  None. FINDINGS: Brain: There is mild cerebral atrophy with widening of the extra-axial spaces and ventricular dilatation. There are areas of decreased attenuation within the white matter tracts of the supratentorial brain, consistent with microvascular disease changes. Vascular: No hyperdense vessel or unexpected calcification. Skull: Normal. Negative for fracture or focal lesion. Sinuses/Orbits: No acute finding. Other: None. IMPRESSION: 1. Generalized cerebral atrophy. 2. No acute intracranial abnormality. Electronically Signed   By: Virgina Norfolk M.D.   On: 03/16/2020 17:46   CT Chest Wo Contrast  Result Date: 04/11/2020 CLINICAL DATA:  Respiratory illness. EXAM: CT CHEST WITHOUT CONTRAST TECHNIQUE: Multidetector CT imaging of the chest was performed following the standard protocol without IV contrast. COMPARISON:  None. FINDINGS: Cardiovascular: Atherosclerosis of thoracic aorta is noted without aneurysm formation. Normal cardiac size. No pericardial effusion. Mediastinum/Nodes: No enlarged mediastinal or axillary lymph nodes. Thyroid gland, trachea, and esophagus demonstrate no significant findings. Lungs/Pleura: No  pneumothorax or pleural effusion is noted. Diffuse reticular and ground-glass opacities are noted throughout both lungs with peripheral or subpleural sparing. These findings are most consistent with interstitial lung disease, particularly cryptogenic organizing pneumonia. Upper Abdomen: No acute abnormality. Musculoskeletal: Moderate scoliosis of thoracic spine is noted. No acute abnormality is noted. IMPRESSION: 1. Diffuse reticular and ground-glass opacities are noted throughout both lungs with peripheral or subpleural sparing. These findings are most consistent with interstitial lung disease, particularly cryptogenic organizing pneumonia. 2. Moderate scoliosis of thoracic spine. Aortic Atherosclerosis (ICD10-I70.0). Electronically Signed   By: Marijo Conception M.D.   On: 04/11/2020 12:35   US RENAL  Result Date: 03/18/2020 CLINICAL DATA:  Acute tubular necrosis EXAM: RENAL / URINARY TRACT ULTRASOUND COMPLETE COMPARISON:  None. FINDINGS: Right Kidney: Renal measurements: 9 x 5.1 x 4.9 cm = volume: 117 mL. There is increased echotexture of right kidney. No focal mass or hydronephrosis is identified. Left Kidney: Renal measurements: 11.1 x 5.1 x 4.2 cm = volume: 126 mL. Echogenicity within normal limits. No mass or hydronephrosis visualized. Bladder: Appears normal for degree of bladder distention. Other: None. IMPRESSION: Increased echotexture of right kidney.  Otherwise negative. Electronically Signed  By: Abelardo Diesel M.D.   On: 03/18/2020 15:54   ECHOCARDIOGRAM COMPLETE  Result Date: 03/17/2020    ECHOCARDIOGRAM REPORT   Patient Name:   KAISLYN GULAS Date of Exam: 03/17/2020 Medical Rec #:  [id number]       Height:       59.0 in Accession #:    [id number]      Weight:       158.7 lb Date of Birth:  [birth year]      BSA:          1.672 m Patient Age:    11 years        BP:           120/59 mmHg Patient Gender: F               HR:           98 bpm. Exam Location:  [facility] Procedure: 2D Echo, Color Doppler  and Cardiac Doppler Indications:     R06.00 Dyspnea  History:         Patient has prior history of Echocardiogram examinations.                  Signs/Symptoms:Murmur; Risk Factors:Hypertension and HCL.  Sonographer:     Charmayne Sheer RDCS (AE) Referring Phys:  [id number] BRENDA MORRISON Diagnosing Phys: Isaias Cowman MD IMPRESSIONS  1. Left ventricular ejection fraction, by estimation, is 55 to 60%. The left ventricle has normal function. The left ventricle has no regional wall motion abnormalities. Left ventricular diastolic parameters are consistent with Grade I diastolic dysfunction (impaired relaxation).  2. Right ventricular systolic function is normal. The right ventricular size is normal.  3. The mitral valve is normal in structure. Mild mitral valve regurgitation. No evidence of mitral stenosis.  4. The aortic valve is normal in structure. Aortic valve regurgitation is mild to moderate. 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  5. The inferior vena cava is normal in size with greater than 50% respiratory variability, suggesting right atrial pressure of 3 mmHg. FINDINGS  Left Ventricle: Left ventricular ejection fraction, by estimation, is 55 to 60%. The left ventricle has normal function. The left ventricle has no regional wall motion abnormalities. The left ventricular internal cavity size was normal in size. There is  no left ventricular hypertrophy. Left ventricular diastolic parameters are consistent with Grade I diastolic dysfunction (impaired relaxation). Right Ventricle: The right ventricular size is normal. No increase in right ventricular wall thickness. Right ventricular systolic function is normal. Left Atrium: Left atrial size was normal in size. Right Atrium: Right atrial size was normal in size. Pericardium: There is no evidence of pericardial effusion. Mitral Valve: The mitral valve is normal in structure. Normal mobility of the mitral valve leaflets. Mild mitral valve regurgitation. No  evidence of mitral valve stenosis. MV peak gradient, 7.4 mmHg. The mean mitral valve gradient is 3.0 mmHg. Tricuspid Valve: The tricuspid valve is normal in structure. Tricuspid valve regurgitation is mild . No evidence of tricuspid stenosis. Aortic Valve: The aortic valve is normal in structure. Aortic valve regurgitation is mild to moderate. Moderate to severe aortic stenosis is present. Aortic valve mean gradient measures 24.5 mmHg. Aortic valve peak gradient measures 43.2 mmHg. Aortic valve area, by VTI measures 0.99 cm. Pulmonic Valve: The pulmonic valve was normal in structure. Pulmonic valve regurgitation is not visualized. No evidence of pulmonic stenosis. Aorta: The aortic root is normal in size and structure. Venous: The inferior vena cava is  normal in size with greater than 50% respiratory variability, suggesting right atrial pressure of 3 mmHg. IAS/Shunts: No atrial level shunt detected by color flow Doppler.  LEFT VENTRICLE PLAX 2D LVIDd:         2.76 cm     Diastology LVIDs:         2.18 cm     LV e' lateral:   7.83 cm/s LV PW:         0.94 cm     LV E/e' lateral: 11.0 LV IVS:        0.92 cm     LV e' medial:    4.79 cm/s LVOT diam:     1.70 cm     LV E/e' medial:  17.9 LV SV:         61 LV SV Index:   37 LVOT Area:     2.27 cm  LV Volumes (MOD) LV vol d, MOD A2C: 45.9 ml LV vol d, MOD A4C: 53.5 ml LV vol s, MOD A2C: 18.3 ml LV vol s, MOD A4C: 20.8 ml LV SV MOD A2C:     27.6 ml LV SV MOD A4C:     53.5 ml LV SV MOD BP:      29.5 ml RIGHT VENTRICLE RV Basal diam:  2.94 cm LEFT ATRIUM             Index       RIGHT ATRIUM          Index LA diam:        3.00 cm 1.79 cm/m  RA Area:     7.50 cm LA Vol (A2C):   17.4 ml 10.41 ml/m RA Volume:   11.10 ml 6.64 ml/m LA Vol (A4C):   34.0 ml 20.34 ml/m LA Biplane Vol: 26.4 ml 15.79 ml/m  AORTIC VALVE                    PULMONIC VALVE AV Area (Vmax):    0.85 cm     PV Vmax:       1.79 m/s AV Area (Vmean):   0.84 cm     PV Vmean:      119.000 cm/s AV Area  (VTI):     0.99 cm     PV VTI:        0.339 m AV Vmax:           328.50 cm/s  PV Peak grad:  12.8 mmHg AV Vmean:          230.000 cm/s PV Mean grad:  7.0 mmHg AV VTI:            0.619 m AV Peak Grad:      43.2 mmHg AV Mean Grad:      24.5 mmHg LVOT Vmax:         123.00 cm/s LVOT Vmean:        85.600 cm/s LVOT VTI:          0.270 m LVOT/AV VTI ratio: 0.44  AORTA Ao Root diam: 3.00 cm MITRAL VALVE MV Area (PHT): 3.85 cm     SHUNTS MV Peak grad:  7.4 mmHg     Systemic VTI:  0.27 m MV Mean grad:  3.0 mmHg     Systemic Diam: 1.70 cm MV Vmax:       1.36 m/s MV Vmean:      85.3 cm/s MV Decel Time: 197 msec MV E velocity: 85.90 cm/s MV A velocity: 125.00  cm/s MV E/A ratio:  0.69 Isaias Cowman MD Electronically signed by Isaias Cowman MD Signature Date/Time: 03/17/2020/3:42:25 PM    Final    US BIOPSY (KIDNEY)  Result Date: 03/23/2020 INDICATION: 79 year old female with a history of acute kidney injury EXAM: IMAGE GUIDED RENAL BIOPSY MEDICATIONS: None. ANESTHESIA/SEDATION: Moderate (conscious) sedation was employed during this procedure. A total of Versed 1.0 mg and Fentanyl 50 mcg was administered intravenously. Moderate Sedation Time: 11 minutes. The patient's level of consciousness and vital signs were monitored continuously by radiology nursing throughout the procedure under my direct supervision. FLUOROSCOPY TIME:  Ultrasound COMPLICATIONS: None PROCEDURE: Informed written consent was obtained from the patient after a thorough discussion of the procedural risks, benefits and alternatives. All questions were addressed. Maximal Sterile Barrier Technique was utilized including caps, mask, sterile gowns, sterile gloves, sterile drape, hand hygiene and skin antiseptic. A timeout was performed prior to the initiation of the procedure. Patient was positioned prone position on the gantry table. Images were stored sent to PACs. Once the patient is prepped and draped in the usual sterile fashion, the skin and  subcutaneous tissues overlying the left kidney were generously infiltrated 1% lidocaine for local anesthesia. Using ultrasound guidance, transverse approach, a 15 gauge guide needle was advanced into the lateral cortex of the left kidney. Once we confirmed location of the needle tip, 4 separate 16 gauge core biopsy were achieved. Two Gel-Foam pledgets were infused with a small amount of saline. The needle was removed. Final images were stored. The patient tolerated the procedure well and remained hemodynamically stable throughout. No complications were encountered and no significant blood loss encountered. IMPRESSION: Status post ultrasound-guided medical kidney biopsy of the left kidney. Signed, Dulcy Fanny. Dellia Nims, RPVI [facility] Electronically Signed   By: Corrie Mckusick D.O.   On: 03/23/2020 11:37            ASSESSMENT/PLAN   Acute hypoxemic respiratory failure -Due to acute exacerbation of Granulomatosis with polyangitis complicated by recent COVID 19 infection -no hemoptysis or pleurtitic chest pain but does hypoxemia requiring supplemental O2 -at this time patient is s/p Rituxan X2 doses,  I will continue PO prednisone at dose that was started on outpatient 50mg  daily.  Have not initiated pulsed dosing because patient is improving clinically.  Will reach out to rheumatology for additional input and recommendations.  Have discussed care plan with nephrology team who is in agreement with current plan. 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       Thank you for allowing me to participate in the care of this patient.   Patient/Family are satisfied with care plan and all questions have been answered.  This document was prepared using Dragon voice recognition software and may include unintentional dictation errors.     Ottie Glazier, M.D.  Division of [place]

## 2020-04-12 NOTE — Consult Note (Signed)
Reason for Consult: Vasculitis  Referring Physician: Pulmonary  Linda Bradley   HPI: 79 year old white female.  History of borderline hypertension.  Aortic stenosis. Was stable until January.  Had Covid.  Not hospitalized.  Some shortness of breath associated with that. After that was seen by her cardiologist in March.  Stable labs.  No complaints of shortness of breath Developed some decreased appetite dizziness and decreased energy and then shortness of breath.  Had 2-week hospitalization at [facility].  Pulmonary infiltrates.  Aortic stenosis was worse based on echocardiogram.  She had renal insufficiency with creatinine 2 3.  Had proteinuria as well as red cells in her urine.  Complements were normal.  Iron was low.  Sed rate 86.  Hemoglobin 8.3.  Positive ANA 01/03/1979.  Had renal biopsy with posse immune inflammatory cells and over 50% crescents.  She was MPO positive.  Received rituximab on 4 6.  She received 3 doses of pulsed Solu-Medrol half a gram each and then was placed on 70mg  of prednisone She became gradually more short of breath.  She presented for her second rituximab induction and was found to be short of breath with hypoxemic.  She was admitted.  Pulmonary infiltrates.  Abnormal CT. She has not had a mopped assist.  She has had black stools that she has been on iron.  Hemoglobin is down to 6. She is better on oxygen and after diuretic. No joint pain.  No numbness or tingling.  No nasal symptoms.  PMH: Hypertension aortic stenosis ANCA associated vasculitis  SURGICAL HISTORY: No joint surgeries  Family History: No family history of connective tissue disease  Social History: No cigarettes or alcohol  Allergies:  Allergies  Allergen Reactions  . Amlodipine Swelling  . Aspirin   . Chlorthalidone Diarrhea  . Coreg [Carvedilol] Other (See Comments)    Patient states it makes her sick. She refuses.  . Statins     Other reaction(s): Other (See Comments) Other Reaction: CNS  DISORDER    Medications:  Scheduled: . sodium chloride   Intravenous Once  . cloNIDine  0.1 mg Oral Once  . cloNIDine  0.1 mg Oral TID  . feeding supplement (NEPRO CARB STEADY)  237 mL Oral Q24H  . heparin  5,000 Units Subcutaneous Q8H  . hydrALAZINE  50 mg Oral BID  . iron polysaccharides  150 mg Oral Daily  . predniSONE  50 mg Oral Q breakfast  . sodium chloride flush  3 mL Intravenous Q12H  . sulfamethoxazole-trimethoprim  1 tablet Oral Q12H  . cyanocobalamin  1,000 mcg Oral Daily        ROS: No abdominal pain no diarrhea.  No significant weight loss.  Some ankle swelling.  No headache or jaw pain.   PHYSICAL EXAM: Blood pressure (!) 153/61, pulse 91, temperature 98.9 F (37.2 C), temperature source Oral, resp. rate 18, height 4\' 11"  (1.499 m), weight 64.2 kg, SpO2 93 %. Pleasant female.  Getting blood.  Mildly flushed.  No acute distress.  Nasal oxygen in place. Skin without rash Lymph insignificant Chest reasonably clear.  No significant rales or interstitial sounds Harsh systolic murmur.  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  Status post first rituximab induction and pulse steroids currently on only steroids. Now with shortness of breath.  Etiologies might include infection (no fever or cough) pulmonary hemorrhage from her vasculitis (no hemoptysis) or  congestive heart failure  from her aortic stenosis and anemia.  She is improved without the addition of more immunosuppressive agent  Anemia, concerned about blood loss given low iron.  GI vasculitis versus aortic stenosis associated telangiectasias etc.  No obvious hemolysis.  No obvious pulmonary hemorrhage  Aortic stenosis  History of hypertension on hydralazine  Recommendations:  Discontinue hydralazine given the chance of hydralazine related ANCA associated vasculitis which has been  reported  We will check inflammatory markers and her ANCA antibodies to compare with prior  It does not look like she needs a second immunosuppressive agent.  There have been cases where Cytoxan has been given along with rituximab.  Obviously patients with pulmonary hemorrhage could have pheresis.  It does not look like she needs either of those interventions at this point  Work-up GI tract for blood loss  Agree with her getting second dose of rituximab given no obvious infection   Emmaline Kluver 04/12/2020, 1:03 PM

## 2020-04-12 NOTE — Progress Notes (Signed)
[facility]  ROUNDING NOTE   Subjective:  Patient well-known to [place] from last admission and office follow-up. She has underlying pauci immune glomerulonephritis. She has been complaining of shortness of breath. She recently received rituximab infusion to treat the underlying vasculitic GN. 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 She is currently denying hemoptysis.  Objective:  Vital signs in last 24 hours:  Temp:  [97.3 F (36.3 C)-99.7 F (37.6 C)] 99.7 F (37.6 C) (04/21 1337) Pulse Rate:  [68-97] 97 (04/21 1337) Resp:  [14-27] 18 (04/21 1337) BP: (118-203)/(56-88) 159/67 (04/21 1337) SpO2:  [92 %-99 %] 92 % (04/21 1337) Weight:  [64.2 kg] 64.2 kg (04/21 0551)  Weight change:  Filed Weights   04/11/20 1010 04/12/20 0206 04/12/20 0551  Weight: 65.8 kg 64.2 kg 64.2 kg    Intake/Output: I/O last 3 completed shifts: In: 480 [P.O.:480] Out: 500 [Urine:500]   Intake/Output this shift:  Total I/O In: 20 [I.V.:20] Out: -   Physical Exam: General: No acute distress  Head: Normocephalic, atraumatic. Moist oral mucosal membranes  Eyes: Anicteric  Neck: Supple, trachea midline  Lungs:  Fine rales, normal effort  Heart: S1S2 no rubs  Abdomen:  Soft, nontender, bowel sounds present  Extremities: No peripheral edema.  Neurologic: Awake, alert, following commands  Skin: No lesions       Basic Metabolic Panel: Recent Labs  Lab 04/11/20 1029 04/12/20 0458  NA 135 139  K 3.5 3.5  CL 101 101  CO2 23 29  GLUCOSE 139* 93  BUN 56* 58*  CREATININE 2.15* 2.21*  CALCIUM 8.8* 8.3*    Liver Function Tests: No results for input(s): AST, ALT, ALKPHOS, BILITOT, PROT, ALBUMIN in the last 168 hours. No results for input(s): LIPASE, AMYLASE in the last 168 hours. No results for input(s): AMMONIA in the last 168 hours.  CBC: Recent Labs  Lab 04/11/20 1029 04/12/20 0458 04/12/20 0934  WBC 17.8* 13.8*  13.8*  HGB 7.9* 6.5* 7.4*  HCT 22.8* 19.3* 22.0*  MCV 86.4 88.9 88.4  PLT 229 187 197    Cardiac Enzymes: No results for input(s): CKTOTAL, CKMB, CKMBINDEX, TROPONINI in the last 168 hours.  BNP: Invalid input(s): POCBNP  CBG: No results for input(s): GLUCAP in the last 168 hours.  Microbiology: Results for orders placed or performed during the hospital encounter of 04/11/20  Respiratory Panel by RT PCR (Flu A&B, Covid) - Nasopharyngeal Swab     Status: None   Collection Time: 04/11/20 12:21 PM   Specimen: Nasopharyngeal Swab  Result Value Ref Range Status   SARS Coronavirus 2 by RT PCR NEGATIVE NEGATIVE Final    Comment: (NOTE) SARS-CoV-2 target nucleic acids are NOT DETECTED. The SARS-CoV-2 RNA is generally detectable in upper respiratoy specimens during the acute phase of infection. The lowest concentration of SARS-CoV-2 viral copies this assay can detect is 131 copies/mL. A negative result does not preclude SARS-Cov-2 infection and should not be used as the sole basis for treatment or other patient management decisions. A negative result may occur with  improper specimen collection/handling, submission of specimen other than nasopharyngeal swab, presence of viral mutation(s) within the areas targeted by this assay, and inadequate number of viral copies (<131 copies/mL). A negative result must be combined with clinical observations, patient history, and epidemiological information. The expected result is Negative. Fact Sheet for Patients:  PinkCheek.be Fact Sheet for Healthcare Providers:  GravelBags.it This test is not yet ap proved or cleared  by the [place] and  has been authorized for detection and/or diagnosis of SARS-CoV-2 by FDA under an Emergency Use Authorization (EUA). This EUA will remain  in effect (meaning this test can be used) for the duration of the COVID-19 declaration under Section  564(b)(1) of the Act, 21 U.S.C. section 360bbb-3(b)(1), unless the authorization is terminated or revoked sooner.    Influenza A by PCR NEGATIVE NEGATIVE Final   Influenza B by PCR NEGATIVE NEGATIVE Final    Comment: (NOTE) The Xpert Xpress SARS-CoV-2/FLU/RSV assay is intended as an aid in  the diagnosis of influenza from Nasopharyngeal swab specimens and  should not be used as a sole basis for treatment. Nasal washings and  aspirates are unacceptable for Xpert Xpress SARS-CoV-2/FLU/RSV  testing. Fact Sheet for Patients: PinkCheek.be Fact Sheet for Healthcare Providers: GravelBags.it This test is not yet approved or cleared by the [place] FDA and  has been authorized for detection and/or diagnosis of SARS-CoV-2 by  FDA under an Emergency Use Authorization (EUA). This EUA will remain  in effect (meaning this test can be used) for the duration of the  Covid-19 declaration under Section 564(b)(1) of the Act, 21  U.S.C. section 360bbb-3(b)(1), unless the authorization is  terminated or revoked. Performed at [facility], [address] [place], [place] [address]     Coagulation Studies: No results for input(s): LABPROT, INR in the last 72 hours.  Urinalysis: No results for input(s): COLORURINE, LABSPEC, PHURINE, GLUCOSEU, HGBUR, BILIRUBINUR, KETONESUR, PROTEINUR, UROBILINOGEN, NITRITE, LEUKOCYTESUR in the last 72 hours.  Invalid input(s): APPERANCEUR    Imaging: DG Chest 2 View  Result Date: 04/11/2020 CLINICAL DATA:  Shortness of breath. EXAM: CHEST - 2 VIEW COMPARISON:  04/10/2020 FINDINGS: The heart is normal in size. Persistent and slightly more pronounced perihilar interstitial process likely noncardiogenic perihilar pulmonary edema. Symmetric bilateral infiltrates are also possible but less likely. No pleural effusions. The bony thorax is intact. IMPRESSION: Persistent and slightly more pronounced  perihilar interstitial process, likely noncardiogenic perihilar pulmonary edema. Electronically Signed   By: Marijo Sanes M.D.   On: 04/11/2020 11:16   CT Chest Wo Contrast  Result Date: 04/11/2020 CLINICAL DATA:  Respiratory illness. EXAM: CT CHEST WITHOUT CONTRAST TECHNIQUE: Multidetector CT imaging of the chest was performed following the standard protocol without IV contrast. COMPARISON:  None. FINDINGS: Cardiovascular: Atherosclerosis of thoracic aorta is noted without aneurysm formation. Normal cardiac size. No pericardial effusion. Mediastinum/Nodes: No enlarged mediastinal or axillary lymph nodes. Thyroid gland, trachea, and esophagus demonstrate no significant findings. Lungs/Pleura: No pneumothorax or pleural effusion is noted. Diffuse reticular and ground-glass opacities are noted throughout both lungs with peripheral or subpleural sparing. These findings are most consistent with interstitial lung disease, particularly cryptogenic organizing pneumonia. Upper Abdomen: No acute abnormality. Musculoskeletal: Moderate scoliosis of thoracic spine is noted. No acute abnormality is noted. IMPRESSION: 1. Diffuse reticular and ground-glass opacities are noted throughout both lungs with peripheral or subpleural sparing. These findings are most consistent with interstitial lung disease, particularly cryptogenic organizing pneumonia. 2. Moderate scoliosis of thoracic spine. Aortic Atherosclerosis (ICD10-I70.0). Electronically Signed   By: Marijo Conception M.D.   On: 04/11/2020 12:35     Medications:   . sodium chloride     . sodium chloride   Intravenous Once  . atenolol  25 mg Oral Daily  . cloNIDine  0.1 mg Oral Once  . cloNIDine  0.1 mg Oral TID  . feeding supplement (NEPRO CARB STEADY)  237 mL Oral  Q24H  . heparin  5,000 Units Subcutaneous Q8H  . iron polysaccharides  150 mg Oral Daily  . losartan  50 mg Oral Daily  . predniSONE  50 mg Oral Q breakfast  . sodium chloride flush  3 mL  Intravenous Q12H  . sulfamethoxazole-trimethoprim  1 tablet Oral Q12H  . cyanocobalamin  1,000 mcg Oral Daily   sodium chloride, acetaminophen **OR** acetaminophen, clonazePAM, ipratropium-albuterol, ondansetron **OR** ondansetron (ZOFRAN) IV, sodium chloride flush  Assessment/ Plan:  79 y.o. 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 Started treatment with rituximab 03/28/2020.  Now readmitted with likely pulmonary vasculitic involvement  1. 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 Patient underwent renal biopsy 03/23/2020. 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 She was treated with Solu-Medrol 500 mg IV x3 days, rituximab 1 g IV, and then transitioned to prednisone 70 mg daily. -Currently on prednisone 60 mg daily which we will continue.  Case discussed in depth with pulmonary medicine.  Hold off on re-pulsing the patient with higher dose Solu-Medrol at this time.  Patient not having hemoptysis at the moment.  In addition she has been started on Bactrim 1 tablet p.o. every 12 hours however we will likely need to dose adjust this for her underlying chronic kidney disease.  2.  Anemia chronic kidney disease.  Hemoglobin currently 7.4.  We will dose her with Epogen 20,000 units subcutaneous today.  LOS: 1 Linda Bradley 4/21/20212:25 PM

## 2020-04-12 NOTE — Progress Notes (Signed)
Medications administered by Theodoro Doing Student Nurse were supervised by Sara Chu.

## 2020-04-12 NOTE — TOC Initial Note (Signed)
Transition of Care [facility]) - Initial/Assessment Note    Patient Details  Name: Linda Bradley MRN: [id number] Date of Birth: Dec 08, 1941  Transition of [facility] CM/SW Contact:    Victorino Dike, RN Phone Number: 04/12/2020, 1:12 PM  Clinical Narrative:                  Met with patient and daughter.  Patient lives at home, her adult son lives with her.  Patient is independent and does her own driving, shopping, etc.  She uses no DME at home.  She does not use oxygen or have any home health services at this time.  Patient feels she has both son and daughter as a great support in her life. Will continue to follow to see if home oxygen is needed.   Expected Discharge Plan: Home/Self Care Barriers to Discharge: Continued Medical Work up   Patient Goals and CMS Choice        Expected Discharge Plan and Services Expected Discharge Plan: Home/Self Care   Discharge Planning Services: CM Consult Post Acute Care Choice: NA Living arrangements for the past 2 months: Single Family Home                                      Prior Living Arrangements/Services Living arrangements for the past 2 months: Single Family Home Lives with:: Adult Children, Self Patient language and need for interpreter reviewed:: Yes Do you feel safe going back to the place where you live?: Yes      Need for Family Participation in Patient Care: No (Comment) Care giver support system in place?: Yes (comment)      Activities of Daily Living Home Assistive Devices/Equipment: None ADL Screening (condition at time of admission) Patient's cognitive ability adequate to safely complete daily activities?: Yes Is the patient deaf or have difficulty hearing?: No Does the patient have difficulty seeing, even when wearing glasses/contacts?: No Does the patient have difficulty concentrating, remembering, or making decisions?: No Patient able to express need for assistance with ADLs?: Yes Does the patient  have difficulty dressing or bathing?: No Independently performs ADLs?: Yes (appropriate for developmental age) Does the patient have difficulty walking or climbing stairs?: No Weakness of Legs: None Weakness of Arms/Hands: None  Permission Sought/Granted                  Emotional Assessment       Orientation: : Oriented to Self, Oriented to Place, Oriented to  Time, Oriented to Situation Alcohol / Substance Use: Not Applicable Psych Involvement: No (comment)  Admission diagnosis:  Acute pulmonary edema (HCC) [J81.0] Interstitial lung disease ([place]) [J84.9] Acute respiratory failure with hypoxia ([place]) [J96.01] Patient Active Problem List   Diagnosis Date Noted  . Acute on chronic respiratory failure with hypoxia ([place]) 04/11/2020  . CKD stage 4 secondary to hypertension ([place]) 04/11/2020  . Interstitial lung disease ([place]) 04/11/2020  . History of anemia due to chronic kidney disease 04/11/2020  . Hypertensive urgency 04/11/2020  . 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 Glomerulonephritis, acute   . Acute congestive heart failure ([place])   . AKI (acute kidney injury) ([place])   . Anemia of chronic disease   . Dyspnea 03/16/2020  . Hypokalemia 03/14/2016  . Hyponatremia 03/14/2016  . Aortic stenosis, mild 05/26/2015  . Abnormal EKG 04/14/2015  . Dyspnea on exertion 04/14/2015  . Accelerated essential hypertension 04/14/2015  PCP:  Lynnell Jude, MD Pharmacy:   [facility] [address] [place] [address] Phone: [phone] Fax: [phone]     Social Determinants of Health (SDOH) Interventions    Readmission Risk Interventions No flowsheet data found.

## 2020-04-12 NOTE — Progress Notes (Signed)
PROGRESS NOTE    Linda Bradley  HMC:[id number] DOB: 1941/09/23 DOA: 04/11/2020 PCP: Lynnell Jude, MD   Brief Narrative:  Linda Bradley is a 79 y.o. 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   She was seen at infusion center for her Rituxan infusion which was recently started due to worsening ANCA associated polyangiitis and renal function.  She was hypoxic in high 80s and was sent to ED for further evaluation. She had Covid in January, since then experiencing worsening shortness of breath.  Had recent admission at [facility] with hypertensive urgency and pulmonary edema and found to have worsening renal function and aortic stenosis. Had her renal biopsy on 03/23/2020 which shows positive glomerulonephritis involving 50% of her kidneys with crescent formation.  She received 3 pulses of Solu-Medrol 500 mg daily and discharged home on prednisone 70 mg daily.  She received her first dose of rituximab on 04/07/2020.  Subjective: Patient continues to feel some shortness of breath, no other complaint.  She did experience some black color stools for the past couple of weeks since she was started on iron supplement.  No BM since current hospitalization.  No use of NSAID.  No abdominal pain.  Assessment & Plan:   Principal Problem:   Interstitial lung disease ([place]) Active Problems:   Acute on chronic respiratory failure with hypoxia (HCC)   CKD stage 4 secondary to hypertension ([place])   History of anemia due to chronic kidney disease   Hypertensive urgency  Acute hypoxic respiratory failure. 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  CT chest with worsening of her groundglass  opacities.  Pulmonary was consulted and they are recommending PCP prophylaxis along with testing for Fungitell. -Pulmonary decrease the dose of prednisone to 50 mg daily. -Try weaning her off from oxygen-we will ambulate patient to see if she needs continuation of oxygen if fail to wean her off. -Rheumatology was also consulted and they are recommending continuation of rituximab.  Hypertension.  Blood pressure elevated.  Her lisinopril was discontinued during previous hospitalization due to worsening renal function.  She was discharged on atenolol, clonidine and hydralazine.  Rheumatology is recommending discontinuation of hydralazine due to worsening pulmonary function. After talking with nephrology we will start her on losartan 50 mg daily. -Continue atenolol and clonidine. -Discontinue hydralazine.  Anemia of chronic kidney disease/iron deficiency/B12 deficiency.  During prior hospitalization she was found to have iron and vitamin B12 deficiency and was started on supplement.  Patient with worsening anemia.  Hemoglobin at 7.9, dropped to 6.5 on morning labs which improved to 7.4 on recheck.  No obvious source of bleeding.  History of black stools but patient is on iron supplement. 1 unit of PRBC was ordered by night on-call. -Patient might need to start on EPO -Monitor hemoglobin. -Check FOBT.  -Continue iron and B12 supplement.  Stage IV chronic kidney disease.  Patient with pauci immune glomerulonephritis.  Renal function seems stable. -Nephrology was consulted-appreciate their recommendations. -Monitor renal function -Avoid nephrotoxins   Objective: Vitals:   04/12/20 1225 04/12/20 1233 04/12/20 1300 04/12/20 1337  BP: (!) 160/67 (!) 153/61 (!) 167/75 (!) 159/67  Pulse: 94 91 94 97  Resp: 18 18    Temp: 99.4 F (37.4 C) 98.9 F (37.2 C)  99 F (37.2 C)   TempSrc: Axillary Axillary Axillary   SpO2: 92% 93%    Weight:      Height:        Intake/Output Summary (Last 24 hours)  at 04/12/2020 1348 Last data filed at 04/12/2020 1135 Gross per 24 hour  Intake 500 ml  Output 500 ml  Net 0 ml   Filed Weights   04/11/20 1010 04/12/20 0206 04/12/20 0551  Weight: 65.8 kg 64.2 kg 64.2 kg    Examination:  General exam: Appears calm and comfortable  Respiratory system: Few scattered dry crackles, respiratory effort normal. Cardiovascular system: S1 & S2 heard, RRR.  Systolic murmur. Gastrointestinal system: Soft, nontender, nondistended, bowel sounds positive. Central nervous system: Alert and oriented. No focal neurological deficits.Symmetric 5 x 5 power. Extremities: 1+ LE edema, no cyanosis, pulses intact and symmetrical. Psychiatry: Judgement and insight appear normal.    DVT prophylaxis: Heparin Code Status: Full  Family Communication: Daughter was updated at bedside. Disposition Plan:  Status is: Inpatient  Remains inpatient appropriate because:Inpatient level of care appropriate due to severity of illness   Dispo: The patient is from: Home              Anticipated d/c is to: Home              Anticipated d/c date is: 1 day              Patient currently is not medically stable to d/c.  Trying to wean her off from oxygen.  Might qualify for home oxygen if failed.  Consultants:   Pulmonary  Rheumatology  Nephrology  Procedures:  Antimicrobials:   Data Reviewed: I have personally reviewed following labs and imaging studies  CBC: Recent Labs  Lab 04/11/20 1029 04/12/20 0458 04/12/20 0934  WBC 17.8* 13.8* 13.8*  HGB 7.9* 6.5* 7.4*  HCT 22.8* 19.3* 22.0*  MCV 86.4 88.9 88.4  PLT 229 187 595   Basic Metabolic Panel: Recent Labs  Lab 04/11/20 1029 04/12/20 0458  NA 135 139  K 3.5 3.5  CL 101 101  CO2 23 29  GLUCOSE 139* 93  BUN 56* 58*  CREATININE 2.15* 2.21*  CALCIUM 8.8* 8.3*   GFR: Estimated Creatinine Clearance: 17.4 mL/min (A) (by C-G formula based on SCr of 2.21 mg/dL (H)). Liver Function Tests: No results for input(s):  AST, ALT, ALKPHOS, BILITOT, PROT, ALBUMIN in the last 168 hours. No results for input(s): LIPASE, AMYLASE in the last 168 hours. No results for input(s): AMMONIA in the last 168 hours. Coagulation Profile: No results for input(s): INR, PROTIME in the last 168 hours. Cardiac Enzymes: No results for input(s): CKTOTAL, CKMB, CKMBINDEX, TROPONINI in the last 168 hours. BNP (last 3 results) No results for input(s): PROBNP in the last 8760 hours. HbA1C: No results for input(s): HGBA1C in the last 72 hours. CBG: No results for input(s): GLUCAP in the last 168 hours. Lipid Profile: No results for input(s): CHOL, HDL, LDLCALC, TRIG, CHOLHDL, LDLDIRECT in the last 72 hours. Thyroid Function Tests: No results for input(s): TSH, T4TOTAL, FREET4, T3FREE, THYROIDAB in the last 72 hours. Anemia Panel: No results for input(s): VITAMINB12, FOLATE, FERRITIN, TIBC, IRON, RETICCTPCT in the last 72 hours. Sepsis Labs: No results for input(s): PROCALCITON, LATICACIDVEN in the last 168 hours.  Recent Results (from the past 240 hour(s))  Respiratory Panel by RT PCR (Flu A&B, Covid) - Nasopharyngeal Swab     Status: None   Collection Time: 04/11/20 12:21 PM  Specimen: Nasopharyngeal Swab  Result Value Ref Range Status   SARS Coronavirus 2 by RT PCR NEGATIVE NEGATIVE Final    Comment: (NOTE) SARS-CoV-2 target nucleic acids are NOT DETECTED. The SARS-CoV-2 RNA is generally detectable in upper respiratoy specimens during the acute phase of infection. The lowest concentration of SARS-CoV-2 viral copies this assay can detect is 131 copies/mL. A negative result does not preclude SARS-Cov-2 infection and should not be used as the sole basis for treatment or other patient management decisions. A negative result may occur with  improper specimen collection/handling, submission of specimen other than nasopharyngeal swab, presence of viral mutation(s) within the areas targeted by this assay, and inadequate number  of viral copies (<131 copies/mL). A negative result must be combined with clinical observations, patient history, and epidemiological information. The expected result is Negative. Fact Sheet for Patients:  PinkCheek.be Fact Sheet for Healthcare Providers:  GravelBags.it This test is not yet ap proved or cleared by the [place] FDA and  has been authorized for detection and/or diagnosis of SARS-CoV-2 by FDA under an Emergency Use Authorization (EUA). This EUA will remain  in effect (meaning this test can be used) for the duration of the COVID-19 declaration under Section 564(b)(1) of the Act, 21 U.S.C. section 360bbb-3(b)(1), unless the authorization is terminated or revoked sooner.    Influenza A by PCR NEGATIVE NEGATIVE Final   Influenza B by PCR NEGATIVE NEGATIVE Final    Comment: (NOTE) The Xpert Xpress SARS-CoV-2/FLU/RSV assay is intended as an aid in  the diagnosis of influenza from Nasopharyngeal swab specimens and  should not be used as a sole basis for treatment. Nasal washings and  aspirates are unacceptable for Xpert Xpress SARS-CoV-2/FLU/RSV  testing. Fact Sheet for Patients: PinkCheek.be Fact Sheet for Healthcare Providers: GravelBags.it This test is not yet approved or cleared by the [place] FDA and  has been authorized for detection and/or diagnosis of SARS-CoV-2 by  FDA under an Emergency Use Authorization (EUA). This EUA will remain  in effect (meaning this test can be used) for the duration of the  Covid-19 declaration under Section 564(b)(1) of the Act, 21  U.S.C. section 360bbb-3(b)(1), unless the authorization is  terminated or revoked. Performed at [facility], [address] [place], [place] [address]      Radiology Studies: DG Chest 2 View  Result Date: 04/11/2020 CLINICAL DATA:  Shortness of breath. EXAM: CHEST - 2  VIEW COMPARISON:  04/10/2020 FINDINGS: The heart is normal in size. Persistent and slightly more pronounced perihilar interstitial process likely noncardiogenic perihilar pulmonary edema. Symmetric bilateral infiltrates are also possible but less likely. No pleural effusions. The bony thorax is intact. IMPRESSION: Persistent and slightly more pronounced perihilar interstitial process, likely noncardiogenic perihilar pulmonary edema. Electronically Signed   By: Marijo Sanes M.D.   On: 04/11/2020 11:16   CT Chest Wo Contrast  Result Date: 04/11/2020 CLINICAL DATA:  Respiratory illness. EXAM: CT CHEST WITHOUT CONTRAST TECHNIQUE: Multidetector CT imaging of the chest was performed following the standard protocol without IV contrast. COMPARISON:  None. FINDINGS: Cardiovascular: Atherosclerosis of thoracic aorta is noted without aneurysm formation. Normal cardiac size. No pericardial effusion. Mediastinum/Nodes: No enlarged mediastinal or axillary lymph nodes. Thyroid gland, trachea, and esophagus demonstrate no significant findings. Lungs/Pleura: No pneumothorax or pleural effusion is noted. Diffuse reticular and ground-glass opacities are noted throughout both lungs with peripheral or subpleural sparing. These findings are most consistent with interstitial lung disease, particularly cryptogenic organizing pneumonia. Upper Abdomen: No acute abnormality.  Musculoskeletal: Moderate scoliosis of thoracic spine is noted. No acute abnormality is noted. IMPRESSION: 1. Diffuse reticular and ground-glass opacities are noted throughout both lungs with peripheral or subpleural sparing. These findings are most consistent with interstitial lung disease, particularly cryptogenic organizing pneumonia. 2. Moderate scoliosis of thoracic spine. Aortic Atherosclerosis (ICD10-I70.0). Electronically Signed   By: Marijo Conception M.D.   On: 04/11/2020 12:35    Scheduled Meds: . sodium chloride   Intravenous Once  . atenolol  25 mg  Oral Daily  . cloNIDine  0.1 mg Oral Once  . cloNIDine  0.1 mg Oral TID  . feeding supplement (NEPRO CARB STEADY)  237 mL Oral Q24H  . heparin  5,000 Units Subcutaneous Q8H  . iron polysaccharides  150 mg Oral Daily  . losartan  50 mg Oral Daily  . predniSONE  50 mg Oral Q breakfast  . sodium chloride flush  3 mL Intravenous Q12H  . sulfamethoxazole-trimethoprim  1 tablet Oral Q12H  . cyanocobalamin  1,000 mcg Oral Daily   Continuous Infusions: . sodium chloride       LOS: 1 day   Time spent: 50 minutes.  I personally reviewed her chart.  Lorella Nimrod, MD [facility]  If 7PM-7AM, please contact night-coverage [email or web address]  04/12/2020, 1:48 PM   This record has been created using Systems analyst. Errors have been sought and corrected,but may not always be located. Such creation errors do not reflect on the standard of care.

## 2020-04-12 NOTE — Progress Notes (Addendum)
Verified orders for prednisone with MD. Per Dr. Lanney Gins start prednisone today. Adjusted meds in MAR. MD made aware patient is not taking lisinopril anymore.   Update 1156: Patient receiving the 1 unit of blood. Tolerating well so far.   Update 1225: Verbal orders to discontinue lisinopril. VSS stable 15 min post start of blood transfusion. About 5 minutes after that patient started shivering, stating she has just been cold and is nervous about the transfusion. Vital signs taken and Dr. Reesa Chew paged to notify. Verbal orders to monitor patient for now and if patient continues to shiver and temp increases stop the blood and follow protocol. Will continue to monitor for now. Temperature re-adjusted in room and patient stating she is feeling good.   Update 1236: VSS  Update 1325: Patient complaining of flushing. Face flushed and per pt this happens when her BP is elevated. It has happened before. Vital signs taken, BP elevated at 167/75. MD notified. MD re-ordered home med atenolol that she was not receiving here since admission. Per MD this does not seem like a reaction but we can stop the blood and give the atenolol and monitor.   Blood stopped, atenolol given. Will re-assess and monitor. Per blood bank since this is not considered a blood reaction we do not have to send the blood down there or follow that protocol. We can discard what is remaining.    Update 1712: Patient's blood pressure has remained high despite addition of blood pressure meds ( see flowsheet). MD paged to notify. Waiting for call back.   Update 70: MD called back, will put in new orders for hypertension.   Update 1555: PRN med put in by MD, labetalol ordered, med given. Will reassess

## 2020-04-12 NOTE — Progress Notes (Signed)
Patient not medically stable enough to ambulate and check pulse oximetry while ambulating. Patient has been hypertensive and has received blood today. Will inform oncoming shift. Patient also has not had a bowel movement to collect occult blood card.

## 2020-04-13 ENCOUNTER — Other Ambulatory Visit: Payer: Self-pay | Admitting: Oncology

## 2020-04-13 ENCOUNTER — Inpatient Hospital Stay: Payer: Medicare HMO

## 2020-04-13 DIAGNOSIS — J849 Interstitial pulmonary disease, unspecified: Secondary | ICD-10-CM

## 2020-04-13 DIAGNOSIS — N017 Rapidly progressive nephritic syndrome with diffuse crescentic glomerulonephritis: Secondary | ICD-10-CM | POA: Diagnosis not present

## 2020-04-13 DIAGNOSIS — N009 Acute nephritic syndrome with unspecified morphologic changes: Secondary | ICD-10-CM

## 2020-04-13 DIAGNOSIS — K921 Melena: Secondary | ICD-10-CM

## 2020-04-13 DIAGNOSIS — D62 Acute posthemorrhagic anemia: Secondary | ICD-10-CM

## 2020-04-13 LAB — TYPE AND SCREEN
ABO/RH(D): A POS
Antibody Screen: NEGATIVE
Unit division: 0

## 2020-04-13 LAB — CBC WITH DIFFERENTIAL/PLATELET
Abs Immature Granulocytes: 0.05 10*3/uL (ref 0.00–0.07)
Basophils Absolute: 0 10*3/uL (ref 0.0–0.1)
Basophils Relative: 0 %
Eosinophils Absolute: 0 10*3/uL (ref 0.0–0.5)
Eosinophils Relative: 0 %
HCT: 21.3 % — ABNORMAL LOW (ref 36.0–46.0)
Hemoglobin: 7.3 g/dL — ABNORMAL LOW (ref 12.0–15.0)
Immature Granulocytes: 1 %
Lymphocytes Relative: 10 %
Lymphs Abs: 0.9 10*3/uL (ref 0.7–4.0)
MCH: 29.6 pg (ref 26.0–34.0)
MCHC: 34.3 g/dL (ref 30.0–36.0)
MCV: 86.2 fL (ref 80.0–100.0)
Monocytes Absolute: 0.6 10*3/uL (ref 0.1–1.0)
Monocytes Relative: 7 %
Neutro Abs: 7.4 10*3/uL (ref 1.7–7.7)
Neutrophils Relative %: 82 %
Platelets: 178 10*3/uL (ref 150–400)
RBC: 2.47 MIL/uL — ABNORMAL LOW (ref 3.87–5.11)
RDW: 16.8 % — ABNORMAL HIGH (ref 11.5–15.5)
WBC: 9.1 10*3/uL (ref 4.0–10.5)
nRBC: 0 % (ref 0.0–0.2)

## 2020-04-13 LAB — RENAL FUNCTION PANEL
Albumin: 2.9 g/dL — ABNORMAL LOW (ref 3.5–5.0)
Anion gap: 9 (ref 5–15)
BUN: 53 mg/dL — ABNORMAL HIGH (ref 8–23)
CO2: 27 mmol/L (ref 22–32)
Calcium: 8.1 mg/dL — ABNORMAL LOW (ref 8.9–10.3)
Chloride: 99 mmol/L (ref 98–111)
Creatinine, Ser: 2.25 mg/dL — ABNORMAL HIGH (ref 0.44–1.00)
GFR calc Af Amer: 24 mL/min — ABNORMAL LOW (ref >60)
GFR calc non Af Amer: 20 mL/min — ABNORMAL LOW (ref >60)
Glucose, Bld: 104 mg/dL — ABNORMAL HIGH (ref 70–99)
Phosphorus: 4.1 mg/dL (ref 2.5–4.6)
Potassium: 2.9 mmol/L — ABNORMAL LOW (ref 3.5–5.1)
Sodium: 135 mmol/L (ref 135–145)

## 2020-04-13 LAB — C-REACTIVE PROTEIN: CRP: 5 mg/dL — ABNORMAL HIGH (ref <1.0)

## 2020-04-13 LAB — MAGNESIUM: Magnesium: 1.7 mg/dL (ref 1.7–2.4)

## 2020-04-13 LAB — CBC
HCT: 21.1 % — ABNORMAL LOW (ref 36.0–46.0)
Hemoglobin: 7.4 g/dL — ABNORMAL LOW (ref 12.0–15.0)
MCH: 29.7 pg (ref 26.0–34.0)
MCHC: 35.1 g/dL (ref 30.0–36.0)
MCV: 84.7 fL (ref 80.0–100.0)
Platelets: 177 10*3/uL (ref 150–400)
RBC: 2.49 MIL/uL — ABNORMAL LOW (ref 3.87–5.11)
RDW: 16.8 % — ABNORMAL HIGH (ref 11.5–15.5)
WBC: 9 10*3/uL (ref 4.0–10.5)
nRBC: 0 % (ref 0.0–0.2)

## 2020-04-13 LAB — SEDIMENTATION RATE: Sed Rate: 51 mm/hr — ABNORMAL HIGH (ref 0–30)

## 2020-04-13 LAB — OCCULT BLOOD X 1 CARD TO LAB, STOOL: Fecal Occult Bld: POSITIVE — AB

## 2020-04-13 LAB — BPAM RBC
Blood Product Expiration Date: 202105142359
ISSUE DATE / TIME: 202104211140
Unit Type and Rh: 6200

## 2020-04-13 MED ORDER — POTASSIUM CHLORIDE CRYS ER 20 MEQ PO TBCR
40.0000 meq | EXTENDED_RELEASE_TABLET | ORAL | Status: AC
  Administered 2020-04-13 (×2): 40 meq via ORAL
  Filled 2020-04-13 (×2): qty 2

## 2020-04-13 MED ORDER — ORAL CARE MOUTH RINSE
15.0000 mL | Freq: Two times a day (BID) | OROMUCOSAL | Status: DC
  Administered 2020-04-13: 14:00:00 15 mL via OROMUCOSAL

## 2020-04-13 MED ORDER — ACETAMINOPHEN 325 MG PO TABS
650.0000 mg | ORAL_TABLET | Freq: Once | ORAL | Status: AC
  Administered 2020-04-13: 650 mg via ORAL

## 2020-04-13 MED ORDER — METHYLPREDNISOLONE SODIUM SUCC 125 MG IJ SOLR
100.0000 mg | Freq: Once | INTRAMUSCULAR | Status: AC
  Administered 2020-04-13: 100 mg via INTRAVENOUS

## 2020-04-13 MED ORDER — MAGNESIUM SULFATE 2 GM/50ML IV SOLN
2.0000 g | Freq: Once | INTRAVENOUS | Status: AC
  Administered 2020-04-13: 2 g via INTRAVENOUS
  Filled 2020-04-13: qty 50

## 2020-04-13 MED ORDER — SODIUM CHLORIDE 0.9 % IV SOLN
Freq: Once | INTRAVENOUS | Status: AC
  Filled 2020-04-13: qty 250

## 2020-04-13 MED ORDER — DIPHENHYDRAMINE HCL 50 MG/ML IJ SOLN
25.0000 mg | Freq: Once | INTRAMUSCULAR | Status: AC
  Administered 2020-04-13: 25 mg via INTRAVENOUS

## 2020-04-13 MED ORDER — LOSARTAN POTASSIUM 50 MG PO TABS
100.0000 mg | ORAL_TABLET | Freq: Every day | ORAL | Status: DC
  Administered 2020-04-14: 14:00:00 100 mg via ORAL
  Filled 2020-04-13: qty 2

## 2020-04-13 MED ORDER — PREDNISONE 20 MG PO TABS: 40.0000 mg | ORAL_TABLET | Freq: Every day | ORAL | Status: DC

## 2020-04-13 MED ORDER — PANTOPRAZOLE SODIUM 40 MG IV SOLR
40.0000 mg | Freq: Two times a day (BID) | INTRAVENOUS | Status: DC
  Administered 2020-04-13 – 2020-04-14 (×3): 40 mg via INTRAVENOUS
  Filled 2020-04-13 (×3): qty 40

## 2020-04-13 MED ORDER — SODIUM CHLORIDE 0.9 % IV SOLN: 1000.0000 mg | Freq: Once | INTRAVENOUS | Status: DC

## 2020-04-13 MED ORDER — SODIUM CHLORIDE 0.9 % IV SOLN
510.0000 mg | Freq: Once | INTRAVENOUS | Status: AC
  Administered 2020-04-13: 16:00:00 510 mg via INTRAVENOUS
  Filled 2020-04-13: qty 17

## 2020-04-13 MED ORDER — SODIUM CHLORIDE 0.9 % IV SOLN
1000.0000 mg | Freq: Once | INTRAVENOUS | Status: AC
  Administered 2020-04-13: 1000 mg via INTRAVENOUS
  Filled 2020-04-13: qty 100

## 2020-04-13 NOTE — Progress Notes (Addendum)
Patient ambulated to the bathroom and back to bed without any complaints, but after sitting back down patient stated she felt dizzy and lightheaded. Assisted patient back in bed, patient recovered after a couple minutes. MD notified. Patient doing down to cancer center for her retuxan infusion per oncologist.   Update 1326: verbal orders from Dr. Reesa Chew to d/c heparin subcu for positive fecal occult test.

## 2020-04-13 NOTE — Progress Notes (Signed)
[facility]  ROUNDING NOTE   Subjective:  Patient seen and evaluated at bedside. She has been administered her second dosage of rituximab today. Gastroenterology has also evaluated the patient.  Objective:  Vital signs in last 24 hours:  Temp:  [97.9 F (36.6 C)-98.9 F (37.2 C)] 97.9 F (36.6 C) (04/22 1338) Pulse Rate:  [78-93] 88 (04/22 1338) Resp:  [17-18] 18 (04/22 1338) BP: (132-179)/(57-90) 132/63 (04/22 1338) SpO2:  [91 %-96 %] 93 % (04/22 1338) Weight:  [64.1 kg] 64.1 kg (04/22 0414)  Weight change: -1.678 kg Filed Weights   04/12/20 0206 04/12/20 0551 04/13/20 0414  Weight: 64.2 kg 64.2 kg 64.1 kg    Intake/Output: I/O last 3 completed shifts: In: 500 [P.O.:480; I.V.:20] Out: 2100 [Urine:2100]   Intake/Output this shift:  Total I/O In: 384.8 [P.O.:360; I.V.:24.8] Out: -   Physical Exam: General: No acute distress  Head: Normocephalic, atraumatic. Moist oral mucosal membranes  Eyes: Anicteric  Neck: Supple, trachea midline  Lungs:  Fine rales bilateral, normal effort  Heart: S1S2 no rubs  Abdomen:  Soft, nontender, bowel sounds present  Extremities: No peripheral edema.  Neurologic: Awake, alert, following commands  Skin: No lesions       Basic Metabolic Panel: Recent Labs  Lab 04/11/20 1029 04/12/20 0458 04/13/20 0528  NA 135 139 135  K 3.5 3.5 2.9*  CL 101 101 99  CO2 23 29 27   GLUCOSE 139* 93 104*  BUN 56* 58* 53*  CREATININE 2.15* 2.21* 2.25*  CALCIUM 8.8* 8.3* 8.1*  MG  --   --  1.7  PHOS  --   --  4.1    Liver Function Tests: Recent Labs  Lab 04/13/20 0528  ALBUMIN 2.9*   No results for input(s): LIPASE, AMYLASE in the last 168 hours. No results for input(s): AMMONIA in the last 168 hours.  CBC: Recent Labs  Lab 04/11/20 1029 04/12/20 0458 04/12/20 0934 04/12/20 1536 04/13/20 0528  WBC 17.8* 13.8* 13.8*  --  9.1  9.0  NEUTROABS  --   --   --   --  7.4  HGB 7.9* 6.5* 7.4* 7.9* 7.3*  7.4*  HCT 22.8*  19.3* 22.0* 23.5* 21.3*  21.1*  MCV 86.4 88.9 88.4  --  86.2  84.7  PLT 229 187 197  --  178  177    Cardiac Enzymes: No results for input(s): CKTOTAL, CKMB, CKMBINDEX, TROPONINI in the last 168 hours.  BNP: Invalid input(s): POCBNP  CBG: No results for input(s): GLUCAP in the last 168 hours.  Microbiology: Results for orders placed or performed during the hospital encounter of 04/11/20  Respiratory Panel by RT PCR (Flu A&B, Covid) - Nasopharyngeal Swab     Status: None   Collection Time: 04/11/20 12:21 PM   Specimen: Nasopharyngeal Swab  Result Value Ref Range Status   SARS Coronavirus 2 by RT PCR NEGATIVE NEGATIVE Final    Comment: (NOTE) SARS-CoV-2 target nucleic acids are NOT DETECTED. The SARS-CoV-2 RNA is generally detectable in upper respiratoy specimens during the acute phase of infection. The lowest concentration of SARS-CoV-2 viral copies this assay can detect is 131 copies/mL. A negative result does not preclude SARS-Cov-2 infection and should not be used as the sole basis for treatment or other patient management decisions. A negative result may occur with  improper specimen collection/handling, submission of specimen other than nasopharyngeal swab, presence of viral mutation(s) within the areas targeted by this assay, and inadequate number of viral copies (<131  copies/mL). A negative result must be combined with clinical observations, patient history, and epidemiological information. The expected result is Negative. Fact Sheet for Patients:  PinkCheek.be Fact Sheet for Healthcare Providers:  GravelBags.it This test is not yet ap proved or cleared by the [place] FDA and  has been authorized for detection and/or diagnosis of SARS-CoV-2 by FDA under an Emergency Use Authorization (EUA). This EUA will remain  in effect (meaning this test can be used) for the duration of the COVID-19 declaration under  Section 564(b)(1) of the Act, 21 U.S.C. section 360bbb-3(b)(1), unless the authorization is terminated or revoked sooner.    Influenza A by PCR NEGATIVE NEGATIVE Final   Influenza B by PCR NEGATIVE NEGATIVE Final    Comment: (NOTE) The Xpert Xpress SARS-CoV-2/FLU/RSV assay is intended as an aid in  the diagnosis of influenza from Nasopharyngeal swab specimens and  should not be used as a sole basis for treatment. Nasal washings and  aspirates are unacceptable for Xpert Xpress SARS-CoV-2/FLU/RSV  testing. Fact Sheet for Patients: PinkCheek.be Fact Sheet for Healthcare Providers: GravelBags.it This test is not yet approved or cleared by the [place] FDA and  has been authorized for detection and/or diagnosis of SARS-CoV-2 by  FDA under an Emergency Use Authorization (EUA). This EUA will remain  in effect (meaning this test can be used) for the duration of the  Covid-19 declaration under Section 564(b)(1) of the Act, 21  U.S.C. section 360bbb-3(b)(1), unless the authorization is  terminated or revoked. Performed at [facility], [address] [place], [place] [address]     Coagulation Studies: No results for input(s): LABPROT, INR in the last 72 hours.  Urinalysis: No results for input(s): COLORURINE, LABSPEC, PHURINE, GLUCOSEU, HGBUR, BILIRUBINUR, KETONESUR, PROTEINUR, UROBILINOGEN, NITRITE, LEUKOCYTESUR in the last 72 hours.  Invalid input(s): APPERANCEUR    Imaging: No results found.   Medications:   . sodium chloride    . ferumoxytol     . sodium chloride   Intravenous Once  . atenolol  25 mg Oral Daily  . cloNIDine  0.1 mg Oral TID  . epoetin (EPOGEN/PROCRIT) injection  20,000 Units Subcutaneous Q T,Th,Sa-HD  . feeding supplement (NEPRO CARB STEADY)  237 mL Oral Q24H  . iron polysaccharides  150 mg Oral Daily  . [START ON 04/14/2020] losartan  100 mg Oral Daily  . mouth rinse  15 mL Mouth  Rinse BID  . pantoprazole (PROTONIX) IV  40 mg Intravenous Q12H  . predniSONE  50 mg Oral Q breakfast  . sodium chloride flush  3 mL Intravenous Q12H  . [START ON 04/14/2020] sulfamethoxazole-trimethoprim  1 tablet Oral Once per day on Mon Wed Fri  . cyanocobalamin  1,000 mcg Oral Daily   sodium chloride, acetaminophen **OR** acetaminophen, clonazePAM, ipratropium-albuterol, labetalol, ondansetron **OR** ondansetron (ZOFRAN) IV, sodium chloride flush  Assessment/ Plan:  79 y.o. 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 Started treatment with rituximab 03/28/2020.  Now readmitted with likely pulmonary vasculitic involvement  1. 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 Patient underwent renal biopsy 03/23/2020. 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 She was treated with Solu-Medrol 500 mg IV x3 days, rituximab 1 g IV, and then transitioned to prednisone 70 mg daily.  Second dose of rituximab given 04/13/2020. -Overall from a renal perspective the patient appears to be doing fair.  Her renal function has been stable over the course of the hospitalization.  Given  the fact that the findings in the lungs are most compatible with vasculitis we decided to go ahead and administer the second dosage of rituximab 1 g IV today.  Appreciate Dr. Gary Fleet assistance.  She is also been started on Bactrim single strength 1 tablet Monday, Wednesday, Friday.  She will need continued monitoring of her renal function as an outpatient.  She will also be maintained on tapering prednisone dosage.  2.  Anemia chronic kidney disease.  Appreciate input from gastroenterology.  Unclear as to whether the patient will undergo luminal evaluation this admission.  LOS: 2 Hollis Oh 4/22/20213:20 PM

## 2020-04-13 NOTE — Progress Notes (Signed)
PROGRESS NOTE    Linda Bradley  HQI:[id number] DOB: [birth year] DOA: 04/11/2020 PCP: Lynnell Jude, MD   Brief Narrative:  Linda Bradley is a 79 y.o. 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   She was seen at infusion center for her Rituxan infusion which was recently started due to worsening ANCA associated polyangiitis and renal function.  She was hypoxic in high 80s and was sent to ED for further evaluation. She had Covid in January, since then experiencing worsening shortness of breath.  Had recent admission at [facility] with hypertensive urgency and pulmonary edema and found to have worsening renal function and aortic stenosis. Had her renal biopsy on 03/23/2020 which shows positive glomerulonephritis involving 50% of her kidneys with crescent formation.  She received 3 pulses of Solu-Medrol 500 mg daily and discharged home on prednisone 70 mg daily.  She received her first dose of rituximab on 04/07/2020, received second dose today.  Subjective: Patient was feeling little better when seen today.  She received her second dose of rituximab without any acute issues.  We discussed about possible upper GI bleed and getting EGD which she agrees.  She really wants to go home tomorrow.  Assessment & Plan:   Principal Problem:   Interstitial lung disease ([place]) Active Problems:   Acute on chronic respiratory failure with hypoxia (HCC)   CKD stage 4 secondary to hypertension (HCC)   History of anemia due to chronic kidney disease   Hypertensive urgency   Melena   Anemia, posthemorrhagic, acute  Acute hypoxic respiratory failure. 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  CT  chest with worsening of her groundglass opacities.  Pulmonary was consulted and they are recommending PCP prophylaxis along with testing for Fungitell. -Pulmonary decrease the dose of prednisone to 50 mg daily. -Try weaning her off from oxygen-we will ambulate patient to see if she needs continuation of oxygen if fail to wean her off. -Rheumatology was also consulted and they are recommending continuation of rituximab-received the second dose today.  Hypertension.  Blood pressure elevated.  Her lisinopril was discontinued during previous hospitalization due to worsening renal function.  She was discharged on atenolol, clonidine and hydralazine.  Rheumatology is recommending discontinuation of hydralazine due to worsening pulmonary function. -Increase losartan to 100 mg daily. -Continue atenolol and clonidine. -Discontinue hydralazine.  Anemia of chronic kidney disease/iron deficiency/B12 deficiency.  During prior hospitalization she was found to have iron and vitamin B12 deficiency and was started on supplement.  Patient with worsening anemia.  Hemoglobin at 7.9, dropped to 6.5 on morning labs which improved to 7.4 on recheck.  No obvious source of bleeding but patient is on iron supplement. 1 unit of PRBC was ordered by night on-call. -Patient might need to start on EPO -Monitor hemoglobin. -Check FOBT-positive -Continue iron and B12 supplement.  Possible upper GI bleed.  Patient with worsening anemia which can be due to rituximab but FOBT came back positive this morning.  Patient had black tarry stool overnight.  Patient is also on high-dose steroids which can also cause gastritis/PUD.  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 -GI was consulted-going for EGD tomorrow. -HPO after midnight. -Start her on IV Protonix twice daily-we will need Protonix on discharge for GI prophylaxis as she  will remain on steroid.  Stage IV chronic kidney disease.  Patient with pauci immune glomerulonephritis.   Renal function seems stable. -Nephrology was consulted-appreciate their recommendations. -Monitor renal function -Avoid nephrotoxins   Objective: Vitals:   04/13/20 0751 04/13/20 0906 04/13/20 1338 04/13/20 1554  BP: (!) 173/71 (!) 152/60 132/63 (!) 152/64  Pulse: 84 90 88 82  Resp: 18  18 18   Temp: 98.1 F (36.7 C)  97.9 F (36.6 C) 98 F (36.7 C)  TempSrc: Oral  Oral Oral  SpO2: 93%  93% 92%  Weight:      Height:        Intake/Output Summary (Last 24 hours) at 04/13/2020 1639 Last data filed at 04/13/2020 1550 Gross per 24 hour  Intake 530.36 ml  Output 900 ml  Net -369.64 ml   Filed Weights   04/12/20 0206 04/12/20 0551 04/13/20 0414  Weight: 64.2 kg 64.2 kg 64.1 kg    Examination:  General exam: Appears calm and comfortable  Respiratory system: Few scattered dry crackles, respiratory effort normal. Cardiovascular system: S1 & S2 heard, RRR.  Systolic murmur. Gastrointestinal system: Soft, nontender, nondistended, bowel sounds positive. Central nervous system: Alert and oriented. No focal neurological deficits.Symmetric 5 x 5 power. Extremities: 1+ LE edema, no cyanosis, pulses intact and symmetrical. Psychiatry: Judgement and insight appear normal.    DVT prophylaxis: Heparin Code Status: Full  Family Communication: Daughter was updated on phone. Disposition Plan:  Status is: Inpatient  Remains inpatient appropriate because:Inpatient level of care appropriate due to severity of illness   Dispo: The patient is from: Home              Anticipated d/c is to: Home              Anticipated d/c date is: 1 day              Patient currently is not medically stable to d/c.  Trying to wean her off from oxygen.  Might qualify for home oxygen if failed, unable to ambulate yesterday.  Undergoing EGD tomorrow due to possible upper GI bleed.  Consultants:   Pulmonary  Rheumatology Nephrology GI  Procedures:  Antimicrobials:   Data Reviewed: I have personally  reviewed following labs and imaging studies  CBC: Recent Labs  Lab 04/11/20 1029 04/12/20 0458 04/12/20 0934 04/12/20 1536 04/13/20 0528  WBC 17.8* 13.8* 13.8*  --  9.1  9.0  NEUTROABS  --   --   --   --  7.4  HGB 7.9* 6.5* 7.4* 7.9* 7.3*  7.4*  HCT 22.8* 19.3* 22.0* 23.5* 21.3*  21.1*  MCV 86.4 88.9 88.4  --  86.2  84.7  PLT 229 187 197  --  178  300   Basic Metabolic Panel: Recent Labs  Lab 04/11/20 1029 04/12/20 0458 04/13/20 0528  NA 135 139 135  K 3.5 3.5 2.9*  CL 101 101 99  CO2 23 29 27   GLUCOSE 139* 93 104*  BUN 56* 58* 53*  CREATININE 2.15* 2.21* 2.25*  CALCIUM 8.8* 8.3* 8.1*  MG  --   --  1.7  PHOS  --   --  4.1   GFR: Estimated Creatinine Clearance: 17.1 mL/min (A) (by C-G formula based on SCr of 2.25 mg/dL (H)). Liver Function Tests: Recent Labs  Lab 04/13/20 0528  ALBUMIN 2.9*   No results for input(s): LIPASE, AMYLASE in the last 168 hours. No results for input(s): AMMONIA in the last 168 hours. Coagulation Profile: No  results for input(s): INR, PROTIME in the last 168 hours. Cardiac Enzymes: No results for input(s): CKTOTAL, CKMB, CKMBINDEX, TROPONINI in the last 168 hours. BNP (last 3 results) No results for input(s): PROBNP in the last 8760 hours. HbA1C: No results for input(s): HGBA1C in the last 72 hours. CBG: No results for input(s): GLUCAP in the last 168 hours. Lipid Profile: No results for input(s): CHOL, HDL, LDLCALC, TRIG, CHOLHDL, LDLDIRECT in the last 72 hours. Thyroid Function Tests: No results for input(s): TSH, T4TOTAL, FREET4, T3FREE, THYROIDAB in the last 72 hours. Anemia Panel: No results for input(s): VITAMINB12, FOLATE, FERRITIN, TIBC, IRON, RETICCTPCT in the last 72 hours. Sepsis Labs: No results for input(s): PROCALCITON, LATICACIDVEN in the last 168 hours.  Recent Results (from the past 240 hour(s))  Respiratory Panel by RT PCR (Flu A&B, Covid) - Nasopharyngeal Swab     Status: None   Collection Time: 04/11/20  12:21 PM   Specimen: Nasopharyngeal Swab  Result Value Ref Range Status   SARS Coronavirus 2 by RT PCR NEGATIVE NEGATIVE Final    Comment: (NOTE) SARS-CoV-2 target nucleic acids are NOT DETECTED. The SARS-CoV-2 RNA is generally detectable in upper respiratoy specimens during the acute phase of infection. The lowest concentration of SARS-CoV-2 viral copies this assay can detect is 131 copies/mL. A negative result does not preclude SARS-Cov-2 infection and should not be used as the sole basis for treatment or other patient management decisions. A negative result may occur with  improper specimen collection/handling, submission of specimen other than nasopharyngeal swab, presence of viral mutation(s) within the areas targeted by this assay, and inadequate number of viral copies (<131 copies/mL). A negative result must be combined with clinical observations, patient history, and epidemiological information. The expected result is Negative. Fact Sheet for Patients:  PinkCheek.be Fact Sheet for Healthcare Providers:  GravelBags.it This test is not yet ap proved or cleared by the [place] FDA and  has been authorized for detection and/or diagnosis of SARS-CoV-2 by FDA under an Emergency Use Authorization (EUA). This EUA will remain  in effect (meaning this test can be used) for the duration of the COVID-19 declaration under Section 564(b)(1) of the Act, 21 U.S.C. section 360bbb-3(b)(1), unless the authorization is terminated or revoked sooner.    Influenza A by PCR NEGATIVE NEGATIVE Final   Influenza B by PCR NEGATIVE NEGATIVE Final    Comment: (NOTE) The Xpert Xpress SARS-CoV-2/FLU/RSV assay is intended as an aid in  the diagnosis of influenza from Nasopharyngeal swab specimens and  should not be used as a sole basis for treatment. Nasal washings and  aspirates are unacceptable for Xpert Xpress SARS-CoV-2/FLU/RSV   testing. Fact Sheet for Patients: PinkCheek.be Fact Sheet for Healthcare Providers: GravelBags.it This test is not yet approved or cleared by the [place] FDA and  has been authorized for detection and/or diagnosis of SARS-CoV-2 by  FDA under an Emergency Use Authorization (EUA). This EUA will remain  in effect (meaning this test can be used) for the duration of the  Covid-19 declaration under Section 564(b)(1) of the Act, 21  U.S.C. section 360bbb-3(b)(1), unless the authorization is  terminated or revoked. Performed at [facility], [address] [place], [place] [address]      Radiology Studies: No results found.  Scheduled Meds: . sodium chloride   Intravenous Once  . atenolol  25 mg Oral Daily  . cloNIDine  0.1 mg Oral TID  . epoetin (EPOGEN/PROCRIT) injection  20,000 Units Subcutaneous Q T,Th,Sa-HD  .  feeding supplement (NEPRO CARB STEADY)  237 mL Oral Q24H  . iron polysaccharides  150 mg Oral Daily  . [START ON 04/14/2020] losartan  100 mg Oral Daily  . mouth rinse  15 mL Mouth Rinse BID  . pantoprazole (PROTONIX) IV  40 mg Intravenous Q12H  . predniSONE  50 mg Oral Q breakfast  . sodium chloride flush  3 mL Intravenous Q12H  . [START ON 04/14/2020] sulfamethoxazole-trimethoprim  1 tablet Oral Once per day on Mon Wed Fri  . cyanocobalamin  1,000 mcg Oral Daily   Continuous Infusions: . sodium chloride       LOS: 2 days   Time spent: 50 minutes.  I personally reviewed her chart.  Lorella Nimrod, MD [facility]  If 7PM-7AM, please contact night-coverage [email or web address]  04/13/2020, 4:39 PM   This record has been created using Systems analyst. Errors have been sought and corrected,but may not always be located. Such creation errors do not reflect on the standard of care.

## 2020-04-13 NOTE — Consult Note (Signed)
[place]  [phone] [phone]  ID: [name] OB: Jun 10, 1941  MR#: [id number]  AOZ#:[id number]  Patient Care Team: Lynnell Jude, MD as PCP - General (Family Medicine)  CHIEF COMPLAINT: Pauci-immune glomerulonephritis.  INTERVAL HISTORY: Patient is a 79 year old female who was recently readmitted to the hospital with increasing shortness of breath and hypoxia thought to be related to her underlying vasculitis.  She was scheduled to receive her second dose of Rituxan in the [facility] yesterday.  Patient was evaluated in the [place] today.  She continues to have increased weakness and fatigue.  Her shortness of breath has improved, but she is not back to her baseline.  She has no neurologic complaints.  She denies any recent fevers.  She has a good appetite and denies weight loss.  She denies any chest pain, cough, or hemoptysis.  She has no nausea, vomiting, constipation, or diarrhea.  She has no urinary complaints.  Patient offers no further specific complaints today.  REVIEW OF SYSTEMS:   Review of Systems  Constitutional: Positive for malaise/fatigue. Negative for fever and weight loss.  Respiratory: Positive for shortness of breath. Negative for cough and hemoptysis.   Cardiovascular: Negative.  Negative for chest pain and leg swelling.  Gastrointestinal: Negative.  Negative for abdominal pain.  Genitourinary: Negative.  Negative for dysuria.  Musculoskeletal: Negative.  Negative for back pain.  Skin: Negative.  Negative for rash.  Neurological: Positive for weakness. Negative for dizziness, focal weakness and headaches.  Psychiatric/Behavioral: Negative.  The patient is not nervous/anxious.     As per HPI. Otherwise, a complete review of systems is negative.  PAST MEDICAL HISTORY: Past Medical History:  Diagnosis Date  . Anxiety disorder   . Dermatitis   . 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 Heart  murmur   . Hypercholesterolemia   . Hypertension   . Lymphadenitis   . Skin cancer   . Spontaneous ecchymoses   . Vitamin D deficiency     PAST SURGICAL HISTORY: Past Surgical History:  Procedure Laterality Date  . CARPAL TUNNEL RELEASE     [facility]  . TONSILLECTOMY    . TRANSTHORACIC ECHOCARDIOGRAM  05/01/2015    EF 60-65%. Abnormal relaxation. Mild aortic stenosis (peak gradient 19 mmHg).    FAMILY HISTORY: Family History  Problem Relation Age of Onset  . Heart disease Father   . Heart failure Father     ADVANCED DIRECTIVES (Y/N):  @ADVDIR @  HEALTH MAINTENANCE: Social History   Tobacco Use  . Smoking status: Never Smoker  . Smokeless tobacco: Never Used  Substance Use Topics  . Alcohol use: No  . Drug use: No     Colonoscopy:  PAP:  Bone density:  Lipid panel:  Allergies  Allergen Reactions  . Amlodipine Swelling  . Aspirin   . Chlorthalidone Diarrhea  . Coreg [Carvedilol] Other (See Comments)    Patient states it makes her sick. She refuses.  . Statins     Other reaction(s): Other (See Comments) Other Reaction: CNS DISORDER    Current Facility-Administered Medications  Medication Dose Route Frequency Provider Last Rate Last Admin  . 0.9 %  sodium chloride infusion (Manually program via Guardrails IV Fluids)   Intravenous Once Lang Snow, NP      . 0.9 %  sodium chloride infusion  250 mL Intravenous PRN Agbata, Tochukwu, MD      . acetaminophen (TYLENOL) tablet 650 mg  650 mg Oral Q6H PRN Agbata, Tochukwu,  MD       Or  . acetaminophen (TYLENOL) suppository 650 mg  650 mg Rectal Q6H PRN Agbata, Tochukwu, MD      . atenolol (TENORMIN) tablet 25 mg  25 mg Oral Daily Lorella Nimrod, MD   25 mg at 04/13/20 0905  . clonazePAM (KLONOPIN) tablet 0.5 mg  0.5 mg Oral TID PRN Agbata, Tochukwu, MD   0.5 mg at 04/13/20 1550  . cloNIDine (CATAPRES) tablet 0.1 mg  0.1 mg Oral TID Agbata, Tochukwu, MD   0.1 mg at 04/13/20 1539  . epoetin alfa (EPOGEN)  injection 20,000 Units  20,000 Units Subcutaneous Q T,Th,Sa-HD Holley Raring, Munsoor, MD   20,000 Units at 04/13/20 1539  . feeding supplement (NEPRO CARB STEADY) liquid 237 mL  237 mL Oral Q24H Agbata, Tochukwu, MD      . ipratropium-albuterol (DUONEB) 0.5-2.5 (3) MG/3ML nebulizer solution 3 mL  3 mL Nebulization Q6H PRN Agbata, Tochukwu, MD      . iron polysaccharides (NIFEREX) capsule 150 mg  150 mg Oral Daily Agbata, Tochukwu, MD   150 mg at 04/13/20 0905  . labetalol (NORMODYNE) injection 5 mg  5 mg Intravenous Q2H PRN Lorella Nimrod, MD   5 mg at 04/12/20 1752  . [START ON 04/14/2020] losartan (COZAAR) tablet 100 mg  100 mg Oral Daily Lateef, Munsoor, MD      . MEDLINE mouth rinse  15 mL Mouth Rinse BID Lorella Nimrod, MD   15 mL at 04/13/20 1338  . ondansetron (ZOFRAN) tablet 4 mg  4 mg Oral Q6H PRN Agbata, Tochukwu, MD       Or  . ondansetron (ZOFRAN) injection 4 mg  4 mg Intravenous Q6H PRN Agbata, Tochukwu, MD      . pantoprazole (PROTONIX) injection 40 mg  40 mg Intravenous Q12H Lorella Nimrod, MD   40 mg at 04/13/20 0906  . predniSONE (DELTASONE) tablet 50 mg  50 mg Oral Q breakfast Ottie Glazier, MD   50 mg at 04/13/20 0905  . sodium chloride flush (NS) 0.9 % injection 3 mL  3 mL Intravenous Q12H Agbata, Tochukwu, MD   3 mL at 04/13/20 0907  . sodium chloride flush (NS) 0.9 % injection 3 mL  3 mL Intravenous PRN Agbata, Tochukwu, MD      . Derrill Memo ON 04/14/2020] sulfamethoxazole-trimethoprim (BACTRIM) 400-80 MG per tablet 1 tablet  1 tablet Oral Once per day on Mon Wed Fri Benita Gutter, [facility]      . vitamin B-12 (CYANOCOBALAMIN) tablet 1,000 mcg  1,000 mcg Oral Daily Agbata, Tochukwu, MD   1,000 mcg at 04/13/20 0905    OBJECTIVE: Vitals:   04/13/20 1338 04/13/20 1554  BP: 132/63 (!) 152/64  Pulse: 88 82  Resp: 18 18  Temp: 97.9 F (36.6 C) 98 F (36.7 C)  SpO2: 93% 92%     Body mass index is 28.54 kg/m.    ECOG FS:1 - Symptomatic but completely ambulatory  General: Well-developed,  well-nourished, no acute distress. Eyes: Pink conjunctiva, anicteric sclera. HEENT: Normocephalic, moist mucous membranes. Lungs: No audible wheezing or coughing. Heart: Regular rate and rhythm. Abdomen: Soft, nontender, no obvious distention. Musculoskeletal: No edema, cyanosis, or clubbing. Neuro: Alert, answering all questions appropriately. Cranial nerves grossly intact. Skin: No rashes or petechiae noted. Psych: Normal affect.   LAB RESULTS:  Lab Results  Component Value Date   NA 135 04/13/2020   K 2.9 (L) 04/13/2020   CL 99 04/13/2020   CO2 27 04/13/2020   GLUCOSE  104 (H) 04/13/2020   BUN 53 (H) 04/13/2020   CREATININE 2.25 (H) 04/13/2020   CALCIUM 8.1 (L) 04/13/2020   PROT 5.8 (L) 03/15/2016   ALBUMIN 2.9 (L) 04/13/2020   AST 38 03/15/2016   ALT 30 03/15/2016   ALKPHOS 59 03/15/2016   BILITOT 1.3 (H) 03/15/2016   GFRNONAA 20 (L) 04/13/2020   GFRAA 24 (L) 04/13/2020    Lab Results  Component Value Date   WBC 9.0 04/13/2020   WBC 9.1 04/13/2020   NEUTROABS 7.4 04/13/2020   HGB 7.4 (L) 04/13/2020   HGB 7.3 (L) 04/13/2020   HCT 21.1 (L) 04/13/2020   HCT 21.3 (L) 04/13/2020   MCV 84.7 04/13/2020   MCV 86.2 04/13/2020   PLT 177 04/13/2020   PLT 178 04/13/2020     STUDIES: DG Chest 1 View  Result Date: 03/18/2020 CLINICAL DATA:  Acute tubular necrosis, history of shortness of breath EXAM: CHEST  1 VIEW COMPARISON:  03/16/2020 FINDINGS: Single frontal view of the chest demonstrates a stable cardiac silhouette. There is persistent central vascular congestion and interstitial prominence. New left basilar consolidation and small left effusion. No pneumothorax. IMPRESSION: 1. Progressive pulmonary edema, with new left pleural effusion. Electronically Signed   By: Randa Ngo M.D.   On: 03/18/2020 19:49   DG Chest 2 View  Result Date: 04/11/2020 CLINICAL DATA:  Shortness of breath. EXAM: CHEST - 2 VIEW COMPARISON:  04/10/2020 FINDINGS: The heart is normal in  size. Persistent and slightly more pronounced perihilar interstitial process likely noncardiogenic perihilar pulmonary edema. Symmetric bilateral infiltrates are also possible but less likely. No pleural effusions. The bony thorax is intact. IMPRESSION: Persistent and slightly more pronounced perihilar interstitial process, likely noncardiogenic perihilar pulmonary edema. Electronically Signed   By: Marijo Sanes M.D.   On: 04/11/2020 11:16   DG Chest 2 View  Result Date: 04/10/2020 CLINICAL DATA:  79 year old female with shortness of breath. EXAM: CHEST - 2 VIEW COMPARISON:  Chest radiograph dated 03/18/2020 FINDINGS: Bilateral patchy nodular and hazy opacities may represent edema or pneumonia. Clinical correlation is recommended. There is no pleural effusion pneumothorax. The cardiac silhouette is within limits. No acute osseous pathology. Degenerative changes of the spine and scoliosis. Atherosclerotic calcification of the aorta. IMPRESSION: Bilateral patchy nodular and hazy opacities may represent edema or pneumonia. Electronically Signed   By: Anner Crete M.D.   On: 04/10/2020 20:53   DG Chest 2 View  Result Date: 03/16/2020 CLINICAL DATA:  Shortness of breath. EXAM: CHEST - 2 VIEW COMPARISON:  Radiograph 05/01/2017 FINDINGS: Borderline cardiomegaly. Unchanged mediastinal contours. Small bilateral pleural effusions with fluid in the fissures. Interstitial opacities consistent with pulmonary edema with mild Kerley B-lines. No confluent airspace disease. No pneumothorax. Scoliotic curvature in the spine with associated degenerative change. Aortic atherosclerosis. IMPRESSION: Findings consistent with CHF. Pulmonary edema with small pleural effusions. Aortic Atherosclerosis (ICD10-I70.0). Electronically Signed   By: Keith Rake M.D.   On: 03/16/2020 15:59   CT HEAD WO CONTRAST  Result Date: 03/16/2020 CLINICAL DATA:  Shortness of breath, chills and posterior headache. EXAM: CT HEAD WITHOUT  CONTRAST TECHNIQUE: Contiguous axial images were obtained from the base of the skull through the vertex without intravenous contrast. COMPARISON:  None. FINDINGS: Brain: There is mild cerebral atrophy with widening of the extra-axial spaces and ventricular dilatation. There are areas of decreased attenuation within the white matter tracts of the supratentorial brain, consistent with microvascular disease changes. Vascular: No hyperdense vessel or unexpected calcification. Skull: Normal. Negative for  fracture or focal lesion. Sinuses/Orbits: No acute finding. Other: None. IMPRESSION: 1. Generalized cerebral atrophy. 2. No acute intracranial abnormality. Electronically Signed   By: Virgina Norfolk M.D.   On: 03/16/2020 17:46   CT Chest Wo Contrast  Result Date: 04/11/2020 CLINICAL DATA:  Respiratory illness. EXAM: CT CHEST WITHOUT CONTRAST TECHNIQUE: Multidetector CT imaging of the chest was performed following the standard protocol without IV contrast. COMPARISON:  None. FINDINGS: Cardiovascular: Atherosclerosis of thoracic aorta is noted without aneurysm formation. Normal cardiac size. No pericardial effusion. Mediastinum/Nodes: No enlarged mediastinal or axillary lymph nodes. Thyroid gland, trachea, and esophagus demonstrate no significant findings. Lungs/Pleura: No pneumothorax or pleural effusion is noted. Diffuse reticular and ground-glass opacities are noted throughout both lungs with peripheral or subpleural sparing. These findings are most consistent with interstitial lung disease, particularly cryptogenic organizing pneumonia. Upper Abdomen: No acute abnormality. Musculoskeletal: Moderate scoliosis of thoracic spine is noted. No acute abnormality is noted. IMPRESSION: 1. Diffuse reticular and ground-glass opacities are noted throughout both lungs with peripheral or subpleural sparing. These findings are most consistent with interstitial lung disease, particularly cryptogenic organizing pneumonia. 2.  Moderate scoliosis of thoracic spine. Aortic Atherosclerosis (ICD10-I70.0). Electronically Signed   By: Marijo Conception M.D.   On: 04/11/2020 12:35   US RENAL  Result Date: 03/18/2020 CLINICAL DATA:  Acute tubular necrosis EXAM: RENAL / URINARY TRACT ULTRASOUND COMPLETE COMPARISON:  None. FINDINGS: Right Kidney: Renal measurements: 9 x 5.1 x 4.9 cm = volume: 117 mL. There is increased echotexture of right kidney. No focal mass or hydronephrosis is identified. Left Kidney: Renal measurements: 11.1 x 5.1 x 4.2 cm = volume: 126 mL. Echogenicity within normal limits. No mass or hydronephrosis visualized. Bladder: Appears normal for degree of bladder distention. Other: None. IMPRESSION: Increased echotexture of right kidney.  Otherwise negative. Electronically Signed   By: Abelardo Diesel M.D.   On: 03/18/2020 15:54   ECHOCARDIOGRAM COMPLETE  Result Date: 03/17/2020    ECHOCARDIOGRAM REPORT   Patient Name:   ZAIRA IACOVELLI Date of Exam: 03/17/2020 Medical Rec #:  [id number]       Height:       59.0 in Accession #:    [id number]      Weight:       158.7 lb Date of Birth:  01-21-1941      BSA:          1.672 m Patient Age:    50 years        BP:           120/59 mmHg Patient Gender: F               HR:           98 bpm. Exam Location:  [facility] Procedure: 2D Echo, Color Doppler and Cardiac Doppler Indications:     R06.00 Dyspnea  History:         Patient has prior history of Echocardiogram examinations.                  Signs/Symptoms:Murmur; Risk Factors:Hypertension and HCL.  Sonographer:     Charmayne Sheer RDCS (AE) Referring Phys:  [id number] BRENDA MORRISON Diagnosing Phys: Isaias Cowman MD IMPRESSIONS  1. Left ventricular ejection fraction, by estimation, is 55 to 60%. The left ventricle has normal function. The left ventricle has no regional wall motion abnormalities. Left ventricular diastolic parameters are consistent with Grade I diastolic dysfunction (impaired relaxation).  2. Right ventricular systolic  function is normal. The  right ventricular size is normal.  3. The mitral valve is normal in structure. Mild mitral valve regurgitation. No evidence of mitral stenosis.  4. The aortic valve is normal in structure. Aortic valve regurgitation is mild to moderate. 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  5. The inferior vena cava is normal in size with greater than 50% respiratory variability, suggesting right atrial pressure of 3 mmHg. FINDINGS  Left Ventricle: Left ventricular ejection fraction, by estimation, is 55 to 60%. The left ventricle has normal function. The left ventricle has no regional wall motion abnormalities. The left ventricular internal cavity size was normal in size. There is  no left ventricular hypertrophy. Left ventricular diastolic parameters are consistent with Grade I diastolic dysfunction (impaired relaxation). Right Ventricle: The right ventricular size is normal. No increase in right ventricular wall thickness. Right ventricular systolic function is normal. Left Atrium: Left atrial size was normal in size. Right Atrium: Right atrial size was normal in size. Pericardium: There is no evidence of pericardial effusion. Mitral Valve: The mitral valve is normal in structure. Normal mobility of the mitral valve leaflets. Mild mitral valve regurgitation. No evidence of mitral valve stenosis. MV peak gradient, 7.4 mmHg. The mean mitral valve gradient is 3.0 mmHg. Tricuspid Valve: The tricuspid valve is normal in structure. Tricuspid valve regurgitation is mild . No evidence of tricuspid stenosis. Aortic Valve: The aortic valve is normal in structure. Aortic valve regurgitation is mild to moderate. Moderate to severe aortic stenosis is present. Aortic valve mean gradient measures 24.5 mmHg. Aortic valve peak gradient measures 43.2 mmHg. Aortic valve area, by VTI measures 0.99 cm. Pulmonic Valve: The pulmonic valve was normal in structure. Pulmonic valve regurgitation is not visualized. No evidence  of pulmonic stenosis. Aorta: The aortic root is normal in size and structure. Venous: The inferior vena cava is normal in size with greater than 50% respiratory variability, suggesting right atrial pressure of 3 mmHg. IAS/Shunts: No atrial level shunt detected by color flow Doppler.  LEFT VENTRICLE PLAX 2D LVIDd:         2.76 cm     Diastology LVIDs:         2.18 cm     LV e' lateral:   7.83 cm/s LV PW:         0.94 cm     LV E/e' lateral: 11.0 LV IVS:        0.92 cm     LV e' medial:    4.79 cm/s LVOT diam:     1.70 cm     LV E/e' medial:  17.9 LV SV:         61 LV SV Index:   37 LVOT Area:     2.27 cm  LV Volumes (MOD) LV vol d, MOD A2C: 45.9 ml LV vol d, MOD A4C: 53.5 ml LV vol s, MOD A2C: 18.3 ml LV vol s, MOD A4C: 20.8 ml LV SV MOD A2C:     27.6 ml LV SV MOD A4C:     53.5 ml LV SV MOD BP:      29.5 ml RIGHT VENTRICLE RV Basal diam:  2.94 cm LEFT ATRIUM             Index       RIGHT ATRIUM          Index LA diam:        3.00 cm 1.79 cm/m  RA Area:     7.50 cm LA Vol (A2C):  17.4 ml 10.41 ml/m RA Volume:   11.10 ml 6.64 ml/m LA Vol (A4C):   34.0 ml 20.34 ml/m LA Biplane Vol: 26.4 ml 15.79 ml/m  AORTIC VALVE                    PULMONIC VALVE AV Area (Vmax):    0.85 cm     PV Vmax:       1.79 m/s AV Area (Vmean):   0.84 cm     PV Vmean:      119.000 cm/s AV Area (VTI):     0.99 cm     PV VTI:        0.339 m AV Vmax:           328.50 cm/s  PV Peak grad:  12.8 mmHg AV Vmean:          230.000 cm/s PV Mean grad:  7.0 mmHg AV VTI:            0.619 m AV Peak Grad:      43.2 mmHg AV Mean Grad:      24.5 mmHg LVOT Vmax:         123.00 cm/s LVOT Vmean:        85.600 cm/s LVOT VTI:          0.270 m LVOT/AV VTI ratio: 0.44  AORTA Ao Root diam: 3.00 cm MITRAL VALVE MV Area (PHT): 3.85 cm     SHUNTS MV Peak grad:  7.4 mmHg     Systemic VTI:  0.27 m MV Mean grad:  3.0 mmHg     Systemic Diam: 1.70 cm MV Vmax:       1.36 m/s MV Vmean:      85.3 cm/s MV Decel Time: 197 msec MV E velocity: 85.90 cm/s MV A velocity:  125.00 cm/s MV E/A ratio:  0.69 Isaias Cowman MD Electronically signed by Isaias Cowman MD Signature Date/Time: 03/17/2020/3:42:25 PM    Final    US BIOPSY (KIDNEY)  Result Date: 03/23/2020 INDICATION: 79 year old female with a history of acute kidney injury EXAM: IMAGE GUIDED RENAL BIOPSY MEDICATIONS: None. ANESTHESIA/SEDATION: Moderate (conscious) sedation was employed during this procedure. A total of Versed 1.0 mg and Fentanyl 50 mcg was administered intravenously. Moderate Sedation Time: 11 minutes. The patient's level of consciousness and vital signs were monitored continuously by radiology nursing throughout the procedure under my direct supervision. FLUOROSCOPY TIME:  Ultrasound COMPLICATIONS: None PROCEDURE: Informed written consent was obtained from the patient after a thorough discussion of the procedural risks, benefits and alternatives. All questions were addressed. Maximal Sterile Barrier Technique was utilized including caps, mask, sterile gowns, sterile gloves, sterile drape, hand hygiene and skin antiseptic. A timeout was performed prior to the initiation of the procedure. Patient was positioned prone position on the gantry table. Images were stored sent to PACs. Once the patient is prepped and draped in the usual sterile fashion, the skin and subcutaneous tissues overlying the left kidney were generously infiltrated 1% lidocaine for local anesthesia. Using ultrasound guidance, transverse approach, a 15 gauge guide needle was advanced into the lateral cortex of the left kidney. Once we confirmed location of the needle tip, 4 separate 16 gauge core biopsy were achieved. Two Gel-Foam pledgets were infused with a small amount of saline. The needle was removed. Final images were stored. The patient tolerated the procedure well and remained hemodynamically stable throughout. No complications were encountered and no significant blood loss encountered. IMPRESSION: Status post  ultrasound-guided medical  kidney biopsy of the left kidney. Signed, Dulcy Fanny. Dellia Nims, RPVI [facility] Radiology Electronically Signed   By: Corrie Mckusick D.O.   On: 03/23/2020 11:37    ASSESSMENT: Pauci-immune glomerulonephritis.  PLAN:    1. Pauci-immune glomerulonephritis: Patient will receive 1000 mg IV Rituxan today.  She received her first infusion on March 28, 2020.  Continue treatment and evaluation per nephrology. She expressed understanding that any questions or concerns regarding her glomerulonephritis or her vasculitis should be directed towards nephrology.  No further intervention is needed at this time.  Patient has now completed her course of Rituxan and no follow-up has been scheduled in the [place] at this time.  Please refer patient back if any additional treatments are necessary.  Appreciate consult, call with questions.  Lloyd Huger, MD   04/13/2020 5:07 PM

## 2020-04-13 NOTE — Consult Note (Signed)
Lucilla Lame, MD [facility]  [address], [place], [place] [address] Phone: [phone] Fax : [phone]  Consultation  Referring Provider:     Dr. Reesa Chew Primary Care Physician:  Lynnell Jude, MD Primary Gastroenterologist: Althia Forts         Reason for Consultation:     Heme positive stools and anemia  Date of Admission:  04/11/2020 Date of Consultation:  04/13/2020         HPI:   Linda Bradley is a 79 y.o. female who has a history of polyangiitis hypertension and stage IV kidney disease who was found to have heme positive stools and anemia.  The patient received a unit of blood and her hemoglobin did not correct appropriately therefore this morning a consult was called for GI.  The patient reports that she has been having black stools for the last few weeks.  She states that she is on Rituxan and she reports that she has been told by both the rheumatologist and nephrologist that her symptoms can be a result of her medication.  The patient denies any Advil Aleve Motrin BCs or Goody powder use.  She also denies any alcohol abuse.  The patient states that she has been in a good state of health for years and never takes medication unless she really needs it.  She reported that she had some shortness of breath for the last 2 to 3 days.  On admission the patient's hemoglobin was 7.9 that went down to 6.5 and then after being rechecked was 7.4.  The patient then got a unit of blood and her blood count went up to 7.9 with a repeat this morning back down to 7.4.  She denies any abdominal pain nausea vomiting fevers or chills.  She states her black stools are soft.  Past Medical History:  Diagnosis Date  . Anxiety disorder   . Dermatitis   . 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 Heart murmur   . Hypercholesterolemia   . Hypertension   . Lymphadenitis   . Skin cancer   . Spontaneous ecchymoses   . Vitamin D deficiency     Past Surgical History:  Procedure Laterality  Date  . CARPAL TUNNEL RELEASE     [facility]  . TONSILLECTOMY    . TRANSTHORACIC ECHOCARDIOGRAM  05/01/2015    EF 60-65%. Abnormal relaxation. Mild aortic stenosis (peak gradient 19 mmHg).    Prior to Admission medications   Medication Sig Start Date End Date Taking? Authorizing Provider  atenolol (TENORMIN) 25 MG tablet Take 1 tablet (25 mg total) by mouth daily. 03/29/20  Yes Fritzi Mandes, MD  clonazePAM (KLONOPIN) 0.5 MG tablet Take 0.5 mg by mouth 3 (three) times daily as needed for anxiety.    Yes [provider]  hydrALAZINE (APRESOLINE) 50 MG tablet Take 1 tablet (50 mg total) by mouth in the morning and at bedtime. 03/28/20  Yes Fritzi Mandes, MD  iron polysaccharides (NIFEREX) 150 MG capsule Take 1 capsule (150 mg total) by mouth daily. 03/28/20  Yes Fritzi Mandes, MD  lisinopril (ZESTRIL) 40 MG tablet Take 40 mg by mouth daily. 03/30/20  Yes [provider]  Nutritional Supplements (FEEDING SUPPLEMENT, NEPRO CARB STEADY,) LIQD Take 237 mLs by mouth daily. 03/29/20  Yes Fritzi Mandes, MD  predniSONE (DELTASONE) 10 MG tablet Take 7 tablets (70 mg total) by mouth daily with breakfast. Patient taking differently: Take 60 mg by mouth daily with breakfast.  03/29/20  Yes Posey Pronto,  Sona, MD  vitamin B-12 1000 MCG tablet Take 1 tablet (1,000 mcg total) by mouth daily. 03/28/20  Yes Fritzi Mandes, MD  cloNIDine (CATAPRES) 0.1 MG tablet Take 1 tablet (0.1 mg total) by mouth 3 (three) times daily. Patient taking differently: Take 0.1 mg by mouth 2 (two) times daily. Take and extra one if >160 if needed 05/01/17 03/16/20  Delman Kitten, MD    Family History  Problem Relation Age of Onset  . Heart disease Father   . Heart failure Father      Social History   Tobacco Use  . Smoking status: Never Smoker  . Smokeless tobacco: Never Used  Substance Use Topics  . Alcohol use: No  . Drug use: No    Allergies as of 04/11/2020 - Review Complete 04/11/2020  Allergen Reaction Noted  . Amlodipine Swelling  06/21/2019  . Aspirin  03/16/2020  . Chlorthalidone Diarrhea 04/10/2016  . Coreg [carvedilol] Other (See Comments) 03/25/2020  . Statins  04/12/2015    Review of Systems:    All systems reviewed and negative except where noted in HPI.   Physical Exam:  Vital signs in last 24 hours: Temp:  [97.9 F (36.6 C)-99.3 F (37.4 C)] 97.9 F (36.6 C) (04/22 1338) Pulse Rate:  [78-93] 88 (04/22 1338) Resp:  [17-18] 18 (04/22 1338) BP: (132-179)/(57-90) 132/63 (04/22 1338) SpO2:  [91 %-96 %] 93 % (04/22 1338) Weight:  [64.1 kg] 64.1 kg (04/22 0414) Last BM Date: 04/12/20 General:   Pleasant, cooperative in NAD Head:  Normocephalic and atraumatic. Eyes:   No icterus.   Conjunctiva pink. PERRLA. Ears:  Normal auditory acuity. Neck:  Supple; no masses or thyroidomegaly Lungs: Respirations even and unlabored. Lungs clear to auscultation bilaterally.   No wheezes, crackles, or rhonchi.  Heart:  Regular rate and rhythm;  Without murmur, clicks, rubs or gallops Abdomen:  Soft, nondistended, nontender. Normal bowel sounds. No appreciable masses or hepatomegaly.  No rebound or guarding.  Rectal:  Not performed. Msk:  Symmetrical without gross deformities.    Extremities:  Without edema, cyanosis or clubbing. Neurologic:  Alert and oriented x3;  grossly normal neurologically. Skin:  Intact without significant lesions or rashes. Cervical Nodes:  No significant cervical adenopathy. Psych:  Alert and cooperative. Normal affect.  LAB RESULTS: Recent Labs    04/12/20 0458 04/12/20 0458 04/12/20 0934 04/12/20 1536 04/13/20 0528  WBC 13.8*  --  13.8*  --  9.1  9.0  HGB 6.5*   < > 7.4* 7.9* 7.3*  7.4*  HCT 19.3*   < > 22.0* 23.5* 21.3*  21.1*  PLT 187  --  197  --  178  177   < > = values in this interval not displayed.   BMET Recent Labs    04/11/20 1029 04/12/20 0458 04/13/20 0528  NA 135 139 135  K 3.5 3.5 2.9*  CL 101 101 99  CO2 23 29 27   GLUCOSE 139* 93 104*  BUN 56* 58*  53*  CREATININE 2.15* 2.21* 2.25*  CALCIUM 8.8* 8.3* 8.1*   LFT Recent Labs    04/13/20 0528  ALBUMIN 2.9*   PT/INR No results for input(s): LABPROT, INR in the last 72 hours.  STUDIES: No results found.    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 y/o female with melena and anemia with a transfusion not showing a appropriate response to the transfusion with further drop in her hemoglobin after the transfusion.  I did discuss the possibility that the patient is having an upper GI bleed with the patient but she insists that she was told that this could be a side effect of the medication she is taking and that she was told that she would go home tomorrow.  The patient has already eaten breakfast and lunch today therefore a upper endoscopy could not be performed today.  The patient differential diagnosis includes peptic ulcer disease, esophagiti, AVM or polyangiitis. The patient also was found to have hematuria at the beginning of this month which can contribute to her anemia.  Plan:  The patient has insisted that she is going home tomorrow with her daughter and does not think that she needs the upper endoscopy.  The patient has been explained that she was found to have blood in her stool and significant anemia with an appropriate response to a unit of blood.  I have notified the hospitalist to discuss discharge planning with the patient and if the patient does go home she should follow-up as an outpatient for further investigation.  The patient states she had a Cologuard test for colon cancer screening that was negative and she was told she does not need any further screening procedures.  The patient has been explained  the plan and I will await to hear from the hospitalist whether the patient will be discharged or remain for her GI work-up.  Thank you for involving me in the care of this patient.      LOS: 2 days   Lucilla Lame, MD  04/13/2020, 2:26 PM Pager [phone] 7am-5pm  Check AMION for 5pm -7am coverage and on weekends   Note: This dictation was prepared with Dragon dictation along with smaller phrase technology. Any transcriptional errors that result from this process are unintentional.

## 2020-04-13 NOTE — Progress Notes (Signed)
Pulmonary Progress Note          Date: 04/13/2020,   MRN# [id number] Linda Bradley September 26, 1941     AdmissionWeight: 65.8 kg                 CurrentWeight: 64.1 kg   Referring physician Dr. Francine Graven   CHIEF COMPLAINT:   Acute hypoxemic respiratory failure with abnormal CT chest   SUBJECTIVE   Patient had episode of dizziness post Rituxan dose which has improved after few hours.   She states her breathing is improved. She has plans for EGD.    PAST MEDICAL HISTORY   Past Medical History:  Diagnosis Date  . Anxiety disorder   . Dermatitis   . 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 Heart murmur   . Hypercholesterolemia   . Hypertension   . Lymphadenitis   . Skin cancer   . Spontaneous ecchymoses   . Vitamin D deficiency      SURGICAL HISTORY   Past Surgical History:  Procedure Laterality Date  . CARPAL TUNNEL RELEASE     [facility]  . TONSILLECTOMY    . TRANSTHORACIC ECHOCARDIOGRAM  05/01/2015    EF 60-65%. Abnormal relaxation. Mild aortic stenosis (peak gradient 19 mmHg).     FAMILY HISTORY   Family History  Problem Relation Age of Onset  . Heart disease Father   . Heart failure Father      SOCIAL HISTORY   Social History   Tobacco Use  . Smoking status: Never Smoker  . Smokeless tobacco: Never Used  Substance Use Topics  . Alcohol use: No  . Drug use: No     MEDICATIONS    Home Medication:    Current Medication:  Current Facility-Administered Medications:  .  0.9 %  sodium chloride infusion (Manually program via Guardrails IV Fluids), , Intravenous, Once, [place], [employer], NP .  0.9 %  sodium chloride infusion, 250 mL, Intravenous, PRN, Agbata, Tochukwu, MD .  acetaminophen (TYLENOL) tablet 650 mg, 650 mg, Oral, Q6H PRN **OR** acetaminophen (TYLENOL) suppository 650 mg, 650 mg, Rectal, Q6H PRN, Agbata, Tochukwu, MD .  atenolol (TENORMIN) tablet 25 mg, 25 mg, Oral, Daily, Lorella Nimrod, MD, 25 mg at 04/13/20  0905 .  clonazePAM (KLONOPIN) tablet 0.5 mg, 0.5 mg, Oral, TID PRN, Agbata, Tochukwu, MD, 0.5 mg at 04/13/20 0905 .  cloNIDine (CATAPRES) tablet 0.1 mg, 0.1 mg, Oral, TID, Agbata, Tochukwu, MD, 0.1 mg at 04/13/20 0905 .  epoetin alfa (EPOGEN) injection 20,000 Units, 20,000 Units, Subcutaneous, Q T,Th,Sa-HD, Lateef, Munsoor, MD .  feeding supplement (NEPRO CARB STEADY) liquid 237 mL, 237 mL, Oral, Q24H, Agbata, Tochukwu, MD .  ferumoxytol (FERAHEME) 510 mg in sodium chloride 0.9 % 100 mL IVPB, 510 mg, Intravenous, Once, Amin, Soundra Pilon, MD .  heparin injection 5,000 Units, 5,000 Units, Subcutaneous, Q8H, Agbata, Tochukwu, MD, 5,000 Units at 04/13/20 0511 .  ipratropium-albuterol (DUONEB) 0.5-2.5 (3) MG/3ML nebulizer solution 3 mL, 3 mL, Nebulization, Q6H PRN, Agbata, Tochukwu, MD .  iron polysaccharides (NIFEREX) capsule 150 mg, 150 mg, Oral, Daily, Agbata, Tochukwu, MD, 150 mg at 04/13/20 0905 .  labetalol (NORMODYNE) injection 5 mg, 5 mg, Intravenous, Q2H PRN, Lorella Nimrod, MD, 5 mg at 04/12/20 1752 .  losartan (COZAAR) tablet 50 mg, 50 mg, Oral, Daily, Lorella Nimrod, MD, 50 mg at 04/13/20 0905 .  magnesium sulfate IVPB 2 g 50 mL, 2 g, Intravenous, Once, Amin, Sumayya, MD .  ondansetron (ZOFRAN) tablet 4 mg, 4 mg,  Oral, Q6H PRN **OR** ondansetron (ZOFRAN) injection 4 mg, 4 mg, Intravenous, Q6H PRN, Agbata, Tochukwu, MD .  pantoprazole (PROTONIX) injection 40 mg, 40 mg, Intravenous, Q12H, Lorella Nimrod, MD, 40 mg at 04/13/20 0906 .  potassium chloride SA (KLOR-CON) CR tablet 40 mEq, 40 mEq, Oral, Q4H, Lorella Nimrod, MD, 40 mEq at 04/13/20 0907 .  predniSONE (DELTASONE) tablet 50 mg, 50 mg, Oral, Q breakfast, Coren Sagan, MD, 50 mg at 04/13/20 0905 .  sodium chloride flush (NS) 0.9 % injection 3 mL, 3 mL, Intravenous, Q12H, Agbata, Tochukwu, MD, 3 mL at 04/13/20 0907 .  sodium chloride flush (NS) 0.9 % injection 3 mL, 3 mL, Intravenous, PRN, Agbata, Tochukwu, MD .  Derrill Memo ON 04/14/2020]  sulfamethoxazole-trimethoprim (BACTRIM) 400-80 MG per tablet 1 tablet, 1 tablet, Oral, Once per day on Mon Wed Fri, Chappell, Alex B, [place] .  vitamin B-12 (CYANOCOBALAMIN) tablet 1,000 mcg, 1,000 mcg, Oral, Daily, Agbata, Tochukwu, MD, 1,000 mcg at 04/13/20 6333  Facility-Administered Medications Ordered in Other Encounters:  .  0.9 %  sodium chloride infusion, , Intravenous, Once, Finnegan, Kathlene November, MD .  acetaminophen (TYLENOL) tablet 650 mg, 650 mg, Oral, Once, Grayland Ormond, Kathlene November, MD .  diphenhydrAMINE (BENADRYL) injection 25 mg, 25 mg, Intravenous, Once, Grayland Ormond, Kathlene November, MD .  methylPREDNISolone sodium succinate (SOLU-MEDROL) 125 mg/2 mL injection 100 mg, 100 mg, Intravenous, Once, Finnegan, Kathlene November, MD .  riTUXimab-pvvr (RUXIENCE) 1,000 mg in sodium chloride 0.9 % 150 mL infusion, 1,000 mg, Intravenous, Once, Finnegan, Kathlene November, MD .  riTUXimab-pvvr (RUXIENCE) 1,000 mg in sodium chloride 0.9 % 250 mL (2.8571 mg/mL) infusion, 1,000 mg, Intravenous, Once, Finnegan, Kathlene November, MD    ALLERGIES   Amlodipine, Aspirin, Chlorthalidone, Coreg [carvedilol], and Statins     REVIEW OF SYSTEMS    Review of Systems:  Gen:  Denies  fever, sweats, chills weigh loss  HEENT: Denies blurred vision, double vision, ear pain, eye pain, hearing loss, nose bleeds, sore throat Cardiac:  No dizziness, chest pain or heaviness, chest tightness,edema Resp:   Reports SOB Gi: Denies swallowing difficulty, stomach pain, nausea or vomiting, diarrhea, constipation, bowel incontinence Gu:  Denies bladder incontinence, burning urine Ext:   Denies Joint pain, stiffness or swelling Skin: Denies  skin rash, easy bruising or bleeding or hives Endoc:  Denies polyuria, polydipsia , polyphagia or weight change Psych:   Denies depression, insomnia or hallucinations   Other:  All other systems negative   VS: BP (!) 152/60   Pulse 90   Temp 98.1 F (36.7 C) (Oral)   Resp 18   Ht 4\' 11"  (1.499 m)   Wt  64.1 kg   SpO2 93%   BMI 28.54 kg/m      PHYSICAL EXAM    GENERAL:NAD, no fevers, chills, no weakness no fatigue HEAD: Normocephalic, atraumatic.  EYES: Pupils equal, round, reactive to light. Extraocular muscles intact. No scleral icterus.  MOUTH: Moist mucosal membrane. Dentition intact. No abscess noted.  EAR, NOSE, THROAT: Clear without exudates. No external lesions.  NECK: Supple. No thyromegaly. No nodules. No JVD.  PULMONARY: mild rhonchorous breath sounds bilaterally  CARDIOVASCULAR: S1 and S2. Regular rate and rhythm. No murmurs, rubs, or gallops. No edema. Pedal pulses 2+ bilaterally.  GASTROINTESTINAL: Soft, nontender, nondistended. No masses. Positive bowel sounds. No hepatosplenomegaly.  MUSCULOSKELETAL: No swelling, clubbing, or edema. Range of motion full in all extremities.  NEUROLOGIC: Cranial nerves II through XII are intact. No gross focal neurological deficits. Sensation intact. Reflexes intact.  SKIN: No ulceration, lesions, rashes, or cyanosis. Skin warm and dry. Turgor intact.  PSYCHIATRIC: Mood, affect within normal limits. The patient is awake, alert and oriented x 3. Insight, judgment intact.       IMAGING    DG Chest 1 View  Result Date: 03/18/2020 CLINICAL DATA:  Acute tubular necrosis, history of shortness of breath EXAM: CHEST  1 VIEW COMPARISON:  03/16/2020 FINDINGS: Single frontal view of the chest demonstrates a stable cardiac silhouette. There is persistent central vascular congestion and interstitial prominence. New left basilar consolidation and small left effusion. No pneumothorax. IMPRESSION: 1. Progressive pulmonary edema, with new left pleural effusion. Electronically Signed   By: Randa Ngo M.D.   On: 03/18/2020 19:49   DG Chest 2 View  Result Date: 04/11/2020 CLINICAL DATA:  Shortness of breath. EXAM: CHEST - 2 VIEW COMPARISON:  04/10/2020 FINDINGS: The heart is normal in size. Persistent and slightly more pronounced perihilar  interstitial process likely noncardiogenic perihilar pulmonary edema. Symmetric bilateral infiltrates are also possible but less likely. No pleural effusions. The bony thorax is intact. IMPRESSION: Persistent and slightly more pronounced perihilar interstitial process, likely noncardiogenic perihilar pulmonary edema. Electronically Signed   By: Marijo Sanes M.D.   On: 04/11/2020 11:16   DG Chest 2 View  Result Date: 04/10/2020 CLINICAL DATA:  79 year old female with shortness of breath. EXAM: CHEST - 2 VIEW COMPARISON:  Chest radiograph dated 03/18/2020 FINDINGS: Bilateral patchy nodular and hazy opacities may represent edema or pneumonia. Clinical correlation is recommended. There is no pleural effusion pneumothorax. The cardiac silhouette is within limits. No acute osseous pathology. Degenerative changes of the spine and scoliosis. Atherosclerotic calcification of the aorta. IMPRESSION: Bilateral patchy nodular and hazy opacities may represent edema or pneumonia. Electronically Signed   By: Anner Crete M.D.   On: 04/10/2020 20:53   DG Chest 2 View  Result Date: 03/16/2020 CLINICAL DATA:  Shortness of breath. EXAM: CHEST - 2 VIEW COMPARISON:  Radiograph 05/01/2017 FINDINGS: Borderline cardiomegaly. Unchanged mediastinal contours. Small bilateral pleural effusions with fluid in the fissures. Interstitial opacities consistent with pulmonary edema with mild Kerley B-lines. No confluent airspace disease. No pneumothorax. Scoliotic curvature in the spine with associated degenerative change. Aortic atherosclerosis. IMPRESSION: Findings consistent with CHF. Pulmonary edema with small pleural effusions. Aortic Atherosclerosis (ICD10-I70.0). Electronically Signed   By: Keith Rake M.D.   On: 03/16/2020 15:59   CT HEAD WO CONTRAST  Result Date: 03/16/2020 CLINICAL DATA:  Shortness of breath, chills and posterior headache. EXAM: CT HEAD WITHOUT CONTRAST TECHNIQUE: Contiguous axial images were obtained  from the base of the skull through the vertex without intravenous contrast. COMPARISON:  None. FINDINGS: Brain: There is mild cerebral atrophy with widening of the extra-axial spaces and ventricular dilatation. There are areas of decreased attenuation within the white matter tracts of the supratentorial brain, consistent with microvascular disease changes. Vascular: No hyperdense vessel or unexpected calcification. Skull: Normal. Negative for fracture or focal lesion. Sinuses/Orbits: No acute finding. Other: None. IMPRESSION: 1. Generalized cerebral atrophy. 2. No acute intracranial abnormality. Electronically Signed   By: Virgina Norfolk M.D.   On: 03/16/2020 17:46   CT Chest Wo Contrast  Result Date: 04/11/2020 CLINICAL DATA:  Respiratory illness. EXAM: CT CHEST WITHOUT CONTRAST TECHNIQUE: Multidetector CT imaging of the chest was performed following the standard protocol without IV contrast. COMPARISON:  None. FINDINGS: Cardiovascular: Atherosclerosis of thoracic aorta is noted without aneurysm formation. Normal cardiac size. No pericardial effusion. Mediastinum/Nodes: No enlarged mediastinal or axillary lymph  nodes. Thyroid gland, trachea, and esophagus demonstrate no significant findings. Lungs/Pleura: No pneumothorax or pleural effusion is noted. Diffuse reticular and ground-glass opacities are noted throughout both lungs with peripheral or subpleural sparing. These findings are most consistent with interstitial lung disease, particularly cryptogenic organizing pneumonia. Upper Abdomen: No acute abnormality. Musculoskeletal: Moderate scoliosis of thoracic spine is noted. No acute abnormality is noted. IMPRESSION: 1. Diffuse reticular and ground-glass opacities are noted throughout both lungs with peripheral or subpleural sparing. These findings are most consistent with interstitial lung disease, particularly cryptogenic organizing pneumonia. 2. Moderate scoliosis of thoracic spine. Aortic Atherosclerosis  (ICD10-I70.0). Electronically Signed   By: Marijo Conception M.D.   On: 04/11/2020 12:35   US RENAL  Result Date: 03/18/2020 CLINICAL DATA:  Acute tubular necrosis EXAM: RENAL / URINARY TRACT ULTRASOUND COMPLETE COMPARISON:  None. FINDINGS: Right Kidney: Renal measurements: 9 x 5.1 x 4.9 cm = volume: 117 mL. There is increased echotexture of right kidney. No focal mass or hydronephrosis is identified. Left Kidney: Renal measurements: 11.1 x 5.1 x 4.2 cm = volume: 126 mL. Echogenicity within normal limits. No mass or hydronephrosis visualized. Bladder: Appears normal for degree of bladder distention. Other: None. IMPRESSION: Increased echotexture of right kidney.  Otherwise negative. Electronically Signed   By: Abelardo Diesel M.D.   On: 03/18/2020 15:54   ECHOCARDIOGRAM COMPLETE  Result Date: 03/17/2020    ECHOCARDIOGRAM REPORT   Patient Name:   TATTIANNA SCHNARR Date of Exam: 03/17/2020 Medical Rec #:  [id number]       Height:       59.0 in Accession #:    [id number]      Weight:       158.7 lb Date of Birth:  1941-04-02      BSA:          1.672 m Patient Age:    40 years        BP:           120/59 mmHg Patient Gender: F               HR:           98 bpm. Exam Location:  [facility] Procedure: 2D Echo, Color Doppler and Cardiac Doppler Indications:     R06.00 Dyspnea  History:         Patient has prior history of Echocardiogram examinations.                  Signs/Symptoms:Murmur; Risk Factors:Hypertension and HCL.  Sonographer:     Charmayne Sheer RDCS (AE) Referring Phys:  [id number] BRENDA MORRISON Diagnosing Phys: Isaias Cowman MD IMPRESSIONS  1. Left ventricular ejection fraction, by estimation, is 55 to 60%. The left ventricle has normal function. The left ventricle has no regional wall motion abnormalities. Left ventricular diastolic parameters are consistent with Grade I diastolic dysfunction (impaired relaxation).  2. Right ventricular systolic function is normal. The right ventricular size is normal.  3. The  mitral valve is normal in structure. Mild mitral valve regurgitation. No evidence of mitral stenosis.  4. The aortic valve is normal in structure. Aortic valve regurgitation is mild to moderate. 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  5. The inferior vena cava is normal in size with greater than 50% respiratory variability, suggesting right atrial pressure of 3 mmHg. FINDINGS  Left Ventricle: Left ventricular ejection fraction, by estimation, is 55 to 60%. The left ventricle has normal function. The left ventricle has no regional wall motion abnormalities.  The left ventricular internal cavity size was normal in size. There is  no left ventricular hypertrophy. Left ventricular diastolic parameters are consistent with Grade I diastolic dysfunction (impaired relaxation). Right Ventricle: The right ventricular size is normal. No increase in right ventricular wall thickness. Right ventricular systolic function is normal. Left Atrium: Left atrial size was normal in size. Right Atrium: Right atrial size was normal in size. Pericardium: There is no evidence of pericardial effusion. Mitral Valve: The mitral valve is normal in structure. Normal mobility of the mitral valve leaflets. Mild mitral valve regurgitation. No evidence of mitral valve stenosis. MV peak gradient, 7.4 mmHg. The mean mitral valve gradient is 3.0 mmHg. Tricuspid Valve: The tricuspid valve is normal in structure. Tricuspid valve regurgitation is mild . No evidence of tricuspid stenosis. Aortic Valve: The aortic valve is normal in structure. Aortic valve regurgitation is mild to moderate. Moderate to severe aortic stenosis is present. Aortic valve mean gradient measures 24.5 mmHg. Aortic valve peak gradient measures 43.2 mmHg. Aortic valve area, by VTI measures 0.99 cm. Pulmonic Valve: The pulmonic valve was normal in structure. Pulmonic valve regurgitation is not visualized. No evidence of pulmonic stenosis. Aorta: The aortic root is normal in size and  structure. Venous: The inferior vena cava is normal in size with greater than 50% respiratory variability, suggesting right atrial pressure of 3 mmHg. IAS/Shunts: No atrial level shunt detected by color flow Doppler.  LEFT VENTRICLE PLAX 2D LVIDd:         2.76 cm     Diastology LVIDs:         2.18 cm     LV e' lateral:   7.83 cm/s LV PW:         0.94 cm     LV E/e' lateral: 11.0 LV IVS:        0.92 cm     LV e' medial:    4.79 cm/s LVOT diam:     1.70 cm     LV E/e' medial:  17.9 LV SV:         61 LV SV Index:   37 LVOT Area:     2.27 cm  LV Volumes (MOD) LV vol d, MOD A2C: 45.9 ml LV vol d, MOD A4C: 53.5 ml LV vol s, MOD A2C: 18.3 ml LV vol s, MOD A4C: 20.8 ml LV SV MOD A2C:     27.6 ml LV SV MOD A4C:     53.5 ml LV SV MOD BP:      29.5 ml RIGHT VENTRICLE RV Basal diam:  2.94 cm LEFT ATRIUM             Index       RIGHT ATRIUM          Index LA diam:        3.00 cm 1.79 cm/m  RA Area:     7.50 cm LA Vol (A2C):   17.4 ml 10.41 ml/m RA Volume:   11.10 ml 6.64 ml/m LA Vol (A4C):   34.0 ml 20.34 ml/m LA Biplane Vol: 26.4 ml 15.79 ml/m  AORTIC VALVE                    PULMONIC VALVE AV Area (Vmax):    0.85 cm     PV Vmax:       1.79 m/s AV Area (Vmean):   0.84 cm     PV Vmean:      119.000 cm/s AV Area (VTI):  0.99 cm     PV VTI:        0.339 m AV Vmax:           328.50 cm/s  PV Peak grad:  12.8 mmHg AV Vmean:          230.000 cm/s PV Mean grad:  7.0 mmHg AV VTI:            0.619 m AV Peak Grad:      43.2 mmHg AV Mean Grad:      24.5 mmHg LVOT Vmax:         123.00 cm/s LVOT Vmean:        85.600 cm/s LVOT VTI:          0.270 m LVOT/AV VTI ratio: 0.44  AORTA Ao Root diam: 3.00 cm MITRAL VALVE MV Area (PHT): 3.85 cm     SHUNTS MV Peak grad:  7.4 mmHg     Systemic VTI:  0.27 m MV Mean grad:  3.0 mmHg     Systemic Diam: 1.70 cm MV Vmax:       1.36 m/s MV Vmean:      85.3 cm/s MV Decel Time: 197 msec MV E velocity: 85.90 cm/s MV A velocity: 125.00 cm/s MV E/A ratio:  0.69 Isaias Cowman MD Electronically  signed by Isaias Cowman MD Signature Date/Time: 03/17/2020/3:42:25 PM    Final    US BIOPSY (KIDNEY)  Result Date: 03/23/2020 INDICATION: 79 year old female with a history of acute kidney injury EXAM: IMAGE GUIDED RENAL BIOPSY MEDICATIONS: None. ANESTHESIA/SEDATION: Moderate (conscious) sedation was employed during this procedure. A total of Versed 1.0 mg and Fentanyl 50 mcg was administered intravenously. Moderate Sedation Time: 11 minutes. The patient's level of consciousness and vital signs were monitored continuously by radiology nursing throughout the procedure under my direct supervision. FLUOROSCOPY TIME:  Ultrasound COMPLICATIONS: None PROCEDURE: Informed written consent was obtained from the patient after a thorough discussion of the procedural risks, benefits and alternatives. All questions were addressed. Maximal Sterile Barrier Technique was utilized including caps, mask, sterile gowns, sterile gloves, sterile drape, hand hygiene and skin antiseptic. A timeout was performed prior to the initiation of the procedure. Patient was positioned prone position on the gantry table. Images were stored sent to PACs. Once the patient is prepped and draped in the usual sterile fashion, the skin and subcutaneous tissues overlying the left kidney were generously infiltrated 1% lidocaine for local anesthesia. Using ultrasound guidance, transverse approach, a 15 gauge guide needle was advanced into the lateral cortex of the left kidney. Once we confirmed location of the needle tip, 4 separate 16 gauge core biopsy were achieved. Two Gel-Foam pledgets were infused with a small amount of saline. The needle was removed. Final images were stored. The patient tolerated the procedure well and remained hemodynamically stable throughout. No complications were encountered and no significant blood loss encountered. IMPRESSION: Status post ultrasound-guided medical kidney biopsy of the left kidney. Signed, Dulcy Fanny. Dellia Nims, RPVI [facility] Electronically Signed   By: Corrie Mckusick D.O.   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    Severe normocytic anemia  -contributing to dyspnea  - unlikely due to Rituxan as other cell lines are within reference range  - intercurrent thrombocytosis - pointing towards chronic blood loss with IDA  -heme/onc is oncase - appreciate input  Discussed with Dr Reesa Chew - patient is on protonix for GI ppx while on steroids - appreciate input  -plan for EGD - Dr Roselyn Reef   Thank you for allowing me to participate in the care of this patient.   Patient/Family are satisfied with care plan and all questions have been answered.  This document was prepared using Dragon voice recognition software and may include unintentional dictation errors.     Ottie Glazier, M.D.  Division of [place]

## 2020-04-13 NOTE — Progress Notes (Signed)
SATURATION QUALIFICATIONS: (This note is used to comply with regulatory documentation for home oxygen)  Patient Saturations on Room Air at Rest = 88%  Patient Saturations on Room Air while Ambulating = 86%  Patient Saturations on 3 Liters of oxygen while Ambulating = 91%  Please briefly explain why patient needs home oxygen: Patient ambulated around nurses desk from room one time. Patient denies shortness of breath. Observed patient's dyspnea when returning to the bed.

## 2020-04-14 ENCOUNTER — Inpatient Hospital Stay: Payer: Medicare HMO | Admitting: Anesthesiology

## 2020-04-14 ENCOUNTER — Encounter
Admission: EM | Disposition: A | Discharge status: Home / Self Care | Payer: Self-pay | Source: Home / Self Care | Attending: Internal Medicine

## 2020-04-14 ENCOUNTER — Encounter: Payer: Self-pay | Admitting: Internal Medicine

## 2020-04-14 HISTORY — PX: ESOPHAGOGASTRODUODENOSCOPY (EGD) WITH PROPOFOL: SHX5813

## 2020-04-14 LAB — RENAL FUNCTION PANEL
Albumin: 3 g/dL — ABNORMAL LOW (ref 3.5–5.0)
Anion gap: 8 (ref 5–15)
BUN: 54 mg/dL — ABNORMAL HIGH (ref 8–23)
CO2: 24 mmol/L (ref 22–32)
Calcium: 8.3 mg/dL — ABNORMAL LOW (ref 8.9–10.3)
Chloride: 104 mmol/L (ref 98–111)
Creatinine, Ser: 2.26 mg/dL — ABNORMAL HIGH (ref 0.44–1.00)
GFR calc Af Amer: 23 mL/min — ABNORMAL LOW (ref >60)
GFR calc non Af Amer: 20 mL/min — ABNORMAL LOW (ref >60)
Glucose, Bld: 125 mg/dL — ABNORMAL HIGH (ref 70–99)
Phosphorus: 2.3 mg/dL — ABNORMAL LOW (ref 2.5–4.6)
Potassium: 4.3 mmol/L (ref 3.5–5.1)
Sodium: 136 mmol/L (ref 135–145)

## 2020-04-14 LAB — CBC
HCT: 21.1 % — ABNORMAL LOW (ref 36.0–46.0)
Hemoglobin: 7.1 g/dL — ABNORMAL LOW (ref 12.0–15.0)
MCH: 30 pg (ref 26.0–34.0)
MCHC: 33.6 g/dL (ref 30.0–36.0)
MCV: 89 fL (ref 80.0–100.0)
Platelets: 176 10*3/uL (ref 150–400)
RBC: 2.37 MIL/uL — ABNORMAL LOW (ref 3.87–5.11)
RDW: 17.1 % — ABNORMAL HIGH (ref 11.5–15.5)
WBC: 11.2 10*3/uL — ABNORMAL HIGH (ref 4.0–10.5)
nRBC: 0 % (ref 0.0–0.2)

## 2020-04-14 LAB — FUNGITELL, SERUM: Fungitell Result: <31 pg/mL (ref <80)

## 2020-04-14 SURGERY — ESOPHAGOGASTRODUODENOSCOPY (EGD) WITH PROPOFOL
Anesthesia: General

## 2020-04-14 MED ORDER — PROPOFOL 500 MG/50ML IV EMUL
INTRAVENOUS | Status: DC | PRN
  Administered 2020-04-14: 175 ug/kg/min via INTRAVENOUS

## 2020-04-14 MED ORDER — LOSARTAN POTASSIUM 100 MG PO TABS: 100.0000 mg | ORAL_TABLET | Freq: Every day | ORAL | 1 refills | Status: DC

## 2020-04-14 MED ORDER — DEXMEDETOMIDINE HCL 200 MCG/2ML IV SOLN
INTRAVENOUS | Status: DC | PRN
  Administered 2020-04-14: 20 ug via INTRAVENOUS

## 2020-04-14 MED ORDER — ONDANSETRON HCL 4 MG/2ML IJ SOLN
INTRAMUSCULAR | Status: AC
  Filled 2020-04-14: qty 2

## 2020-04-14 MED ORDER — SODIUM PHOSPHATES 45 MMOLE/15ML IV SOLN
6.0000 mmol | Freq: Once | INTRAVENOUS | Status: DC
  Filled 2020-04-14: qty 2

## 2020-04-14 MED ORDER — PANTOPRAZOLE SODIUM 40 MG PO TBEC: 40.0000 mg | DELAYED_RELEASE_TABLET | Freq: Every day | ORAL | 1 refills | Status: DC

## 2020-04-14 MED ORDER — DEXAMETHASONE SODIUM PHOSPHATE 10 MG/ML IJ SOLN
INTRAMUSCULAR | Status: AC
  Filled 2020-04-14: qty 1

## 2020-04-14 MED ORDER — PREDNISONE 50 MG PO TABS: 50.0000 mg | ORAL_TABLET | Freq: Every day | ORAL | 0 refills | Status: DC

## 2020-04-14 MED ORDER — SULFAMETHOXAZOLE-TRIMETHOPRIM 400-80 MG PO TABS: 1.0000 | ORAL_TABLET | ORAL | 0 refills | Status: DC

## 2020-04-14 MED ORDER — PROPOFOL 10 MG/ML IV BOLUS
INTRAVENOUS | Status: DC | PRN
  Administered 2020-04-14: 30 mg via INTRAVENOUS
  Administered 2020-04-14: 20 mg via INTRAVENOUS
  Administered 2020-04-14: 50 mg via INTRAVENOUS

## 2020-04-14 MED ORDER — SUCCINYLCHOLINE CHLORIDE 200 MG/10ML IV SOSY
PREFILLED_SYRINGE | INTRAVENOUS | Status: AC
  Filled 2020-04-14: qty 10

## 2020-04-14 MED ORDER — PREDNISONE 50 MG PO TABS
50.0000 mg | ORAL_TABLET | Freq: Every day | ORAL | Status: DC
  Administered 2020-04-14: 15:00:00 50 mg via ORAL
  Filled 2020-04-14: qty 1

## 2020-04-14 MED ORDER — LIDOCAINE HCL (CARDIAC) PF 100 MG/5ML IV SOSY
PREFILLED_SYRINGE | INTRAVENOUS | Status: DC | PRN
  Administered 2020-04-14: 50 mg via INTRAVENOUS

## 2020-04-14 NOTE — Care Management Important Message (Signed)
Important Message  Patient Details  Name: Linda Bradley MRN: [id number] Date of Birth: [birth year]   Medicare Important Message Given:  Yes     Dannette Barbara 04/14/2020, 1:00 PM

## 2020-04-14 NOTE — Progress Notes (Signed)
[facility]  ROUNDING NOTE   Subjective:  Patient had had normal EGD. Renal function appears to be stable with a creatinine of 2.3. Appears to have tolerated rituximab infusion well.  Objective:  Vital signs in last 24 hours:  Temp:  [97.6 F (36.4 C)-98.6 F (37 C)] 98.3 F (36.8 C) (04/23 1326) Pulse Rate:  [69-90] 87 (04/23 1326) Resp:  [15-22] 18 (04/23 1326) BP: (100-208)/(48-97) 186/97 (04/23 1326) SpO2:  [89 %-98 %] 90 % (04/23 1326) Weight:  [64.9 kg] 64.9 kg (04/23 0341)  Weight change: 0.771 kg Filed Weights   04/12/20 0551 04/13/20 0414 04/14/20 0341  Weight: 64.2 kg 64.1 kg 64.9 kg    Intake/Output: I/O last 3 completed shifts: In: 770.4 [P.O.:600; I.V.:24.8; IV Piggyback:145.5] Out: 1050 [Urine:1050]   Intake/Output this shift:  Total I/O In: 200 [I.V.:200] Out: -   Physical Exam: General: No acute distress  Head: Normocephalic, atraumatic. Moist oral mucosal membranes  Eyes: Anicteric  Neck: Supple, trachea midline  Lungs:  Fine rales bilateral, normal effort  Heart: S1S2 no rubs  Abdomen:  Soft, nontender, bowel sounds present  Extremities: No peripheral edema.  Neurologic: Awake, alert, following commands  Skin: No lesions       Basic Metabolic Panel: Recent Labs  Lab 04/11/20 1029 04/11/20 1029 04/12/20 0458 04/13/20 0528 04/14/20 0525  NA 135  --  139 135 136  K 3.5  --  3.5 2.9* 4.3  CL 101  --  101 99 104  CO2 23  --  29 27 24   GLUCOSE 139*  --  93 104* 125*  BUN 56*  --  58* 53* 54*  CREATININE 2.15*  --  2.21* 2.25* 2.26*  CALCIUM 8.8*   < > 8.3* 8.1* 8.3*  MG  --   --   --  1.7  --   PHOS  --   --   --  4.1 2.3*   < > = values in this interval not displayed.    Liver Function Tests: Recent Labs  Lab 04/13/20 0528 04/14/20 0525  ALBUMIN 2.9* 3.0*   No results for input(s): LIPASE, AMYLASE in the last 168 hours. No results for input(s): AMMONIA in the last 168 hours.  CBC: Recent Labs  Lab  04/11/20 1029 04/11/20 1029 04/12/20 0458 04/12/20 0934 04/12/20 1536 04/13/20 0528 04/14/20 0525  WBC 17.8*  --  13.8* 13.8*  --  9.1  9.0 11.2*  NEUTROABS  --   --   --   --   --  7.4  --   HGB 7.9*   < > 6.5* 7.4* 7.9* 7.3*  7.4* 7.1*  HCT 22.8*   < > 19.3* 22.0* 23.5* 21.3*  21.1* 21.1*  MCV 86.4  --  88.9 88.4  --  86.2  84.7 89.0  PLT 229  --  187 197  --  178  177 176   < > = values in this interval not displayed.    Cardiac Enzymes: No results for input(s): CKTOTAL, CKMB, CKMBINDEX, TROPONINI in the last 168 hours.  BNP: Invalid input(s): POCBNP  CBG: No results for input(s): GLUCAP in the last 168 hours.  Microbiology: Results for orders placed or performed during the hospital encounter of 04/11/20  Respiratory Panel by RT PCR (Flu A&B, Covid) - Nasopharyngeal Swab     Status: None   Collection Time: 04/11/20 12:21 PM   Specimen: Nasopharyngeal Swab  Result Value Ref Range Status   SARS Coronavirus 2 by  RT PCR NEGATIVE NEGATIVE Final    Comment: (NOTE) SARS-CoV-2 target nucleic acids are NOT DETECTED. The SARS-CoV-2 RNA is generally detectable in upper respiratoy specimens during the acute phase of infection. The lowest concentration of SARS-CoV-2 viral copies this assay can detect is 131 copies/mL. A negative result does not preclude SARS-Cov-2 infection and should not be used as the sole basis for treatment or other patient management decisions. A negative result may occur with  improper specimen collection/handling, submission of specimen other than nasopharyngeal swab, presence of viral mutation(s) within the areas targeted by this assay, and inadequate number of viral copies (<131 copies/mL). A negative result must be combined with clinical observations, patient history, and epidemiological information. The expected result is Negative. Fact Sheet for Patients:  PinkCheek.be Fact Sheet for Healthcare Providers:   GravelBags.it This test is not yet ap proved or cleared by the [place] FDA and  has been authorized for detection and/or diagnosis of SARS-CoV-2 by FDA under an Emergency Use Authorization (EUA). This EUA will remain  in effect (meaning this test can be used) for the duration of the COVID-19 declaration under Section 564(b)(1) of the Act, 21 U.S.C. section 360bbb-3(b)(1), unless the authorization is terminated or revoked sooner.    Influenza A by PCR NEGATIVE NEGATIVE Final   Influenza B by PCR NEGATIVE NEGATIVE Final    Comment: (NOTE) The Xpert Xpress SARS-CoV-2/FLU/RSV assay is intended as an aid in  the diagnosis of influenza from Nasopharyngeal swab specimens and  should not be used as a sole basis for treatment. Nasal washings and  aspirates are unacceptable for Xpert Xpress SARS-CoV-2/FLU/RSV  testing. Fact Sheet for Patients: PinkCheek.be Fact Sheet for Healthcare Providers: GravelBags.it This test is not yet approved or cleared by the [place] FDA and  has been authorized for detection and/or diagnosis of SARS-CoV-2 by  FDA under an Emergency Use Authorization (EUA). This EUA will remain  in effect (meaning this test can be used) for the duration of the  Covid-19 declaration under Section 564(b)(1) of the Act, 21  U.S.C. section 360bbb-3(b)(1), unless the authorization is  terminated or revoked. Performed at [facility], [address] [place], [place] [address]     Coagulation Studies: No results for input(s): LABPROT, INR in the last 72 hours.  Urinalysis: No results for input(s): COLORURINE, LABSPEC, PHURINE, GLUCOSEU, HGBUR, BILIRUBINUR, KETONESUR, PROTEINUR, UROBILINOGEN, NITRITE, LEUKOCYTESUR in the last 72 hours.  Invalid input(s): APPERANCEUR    Imaging: No results found.   Medications:   . sodium chloride     . atenolol  25 mg Oral Daily  .  cloNIDine  0.1 mg Oral TID  . epoetin (EPOGEN/PROCRIT) injection  20,000 Units Subcutaneous Q T,Th,Sa-HD  . feeding supplement (NEPRO CARB STEADY)  237 mL Oral Q24H  . iron polysaccharides  150 mg Oral Daily  . losartan  100 mg Oral Daily  . mouth rinse  15 mL Mouth Rinse BID  . pantoprazole (PROTONIX) IV  40 mg Intravenous Q12H  . predniSONE  50 mg Oral Q breakfast  . sodium chloride flush  3 mL Intravenous Q12H  . sulfamethoxazole-trimethoprim  1 tablet Oral Once per day on Mon Wed Fri  . cyanocobalamin  1,000 mcg Oral Daily   sodium chloride, acetaminophen **OR** acetaminophen, clonazePAM, ipratropium-albuterol, labetalol, ondansetron **OR** ondansetron (ZOFRAN) IV, sodium chloride flush  Assessment/ Plan:  79 y.o. 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 Started treatment with rituximab 03/28/2020.  Now readmitted  with likely pulmonary vasculitic involvement.  Second dose of rituximab 04/13/2020.  1. 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 Patient underwent renal biopsy 03/23/2020. 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 She was treated with Solu-Medrol 500 mg IV x3 days, rituximab 1 g IV, and then transitioned to prednisone 70 mg daily.  Second dose of rituximab given 04/13/2020. -The patient's renal component of vasculitis appears to be stable with a creatinine of 2.26.  However she does also have pulmonary involvement based upon CT.  She received her second dosage of rituximab yesterday and tolerated well.  We plan to maintain the patient on Bactrim single strength 1 tablet MWF.  We have also increased prednisone back to 50 mg daily.  She is on a weekly taper of the medication.  2.  Anemia chronic kidney disease/suspected GI bleed.  EGD performed today and was negative.  Awaiting further GI  input.  Defer decisions regarding blood transfusion to primary team.  Hemoglobin currently 7.1.   LOS: 3 [place] 4/23/20212:25 PM

## 2020-04-14 NOTE — Progress Notes (Signed)
Pulmonary Progress Note          Date: 04/14/2020,   MRN# [id number] Linda Bradley 01-Dec-1941     AdmissionWeight: 65.8 kg                 CurrentWeight: 64.9 kg   Referring physician Dr. Francine Graven   CHIEF COMPLAINT:   Acute hypoxemic respiratory failure with abnormal CT chest   SUBJECTIVE   Patient states she feels better. She wishes to go home.   During evaluation her BP was >200/92 and she is having her antihypertensive regimen from outpatient restarted.   PAST MEDICAL HISTORY   Past Medical History:  Diagnosis Date  . Anxiety disorder   . Dermatitis   . 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 Heart murmur   . Hypercholesterolemia   . Hypertension   . Lymphadenitis   . Skin cancer   . Spontaneous ecchymoses   . Vitamin D deficiency      SURGICAL HISTORY   Past Surgical History:  Procedure Laterality Date  . CARPAL TUNNEL RELEASE     [facility]  . ESOPHAGOGASTRODUODENOSCOPY (EGD) WITH PROPOFOL N/A 04/14/2020   Procedure: ESOPHAGOGASTRODUODENOSCOPY (EGD) WITH PROPOFOL;  Surgeon: Lucilla Lame, MD;  Location: [facility] ENDOSCOPY;  Service: Endoscopy;  Laterality: N/A;  . TONSILLECTOMY    . TRANSTHORACIC ECHOCARDIOGRAM  05/01/2015    EF 60-65%. Abnormal relaxation. Mild aortic stenosis (peak gradient 19 mmHg).     FAMILY HISTORY   Family History  Problem Relation Age of Onset  . Heart disease Father   . Heart failure Father      SOCIAL HISTORY   Social History   Tobacco Use  . Smoking status: Never Smoker  . Smokeless tobacco: Never Used  Substance Use Topics  . Alcohol use: No  . Drug use: No     MEDICATIONS    Home Medication:    Current Medication:  Current Facility-Administered Medications:  .  0.9 %  sodium chloride infusion, 250 mL, Intravenous, PRN, Lucilla Lame, MD, [place] at 04/14/20 1201 .  acetaminophen (TYLENOL) tablet 650 mg, 650 mg, Oral, Q6H PRN **OR** acetaminophen (TYLENOL) suppository 650 mg, 650 mg,  Rectal, Q6H PRN, Allen Norris, Darren, MD .  atenolol (TENORMIN) tablet 25 mg, 25 mg, Oral, Daily, Allen Norris, Darren, MD, 25 mg at 04/14/20 1428 .  clonazePAM (KLONOPIN) tablet 0.5 mg, 0.5 mg, Oral, TID PRN, Lucilla Lame, MD, 0.5 mg at 04/14/20 1626 .  cloNIDine (CATAPRES) tablet 0.1 mg, 0.1 mg, Oral, TID, Allen Norris, Darren, MD, 0.1 mg at 04/14/20 1730 .  epoetin alfa (EPOGEN) injection 20,000 Units, 20,000 Units, Subcutaneous, Q T,Th,Sa-HD, Lucilla Lame, MD, 20,000 Units at 04/13/20 1539 .  feeding supplement (NEPRO CARB STEADY) liquid 237 mL, 237 mL, Oral, Q24H, Wohl, Darren, MD .  ipratropium-albuterol (DUONEB) 0.5-2.5 (3) MG/3ML nebulizer solution 3 mL, 3 mL, Nebulization, Q6H PRN, Allen Norris, Darren, MD .  iron polysaccharides (NIFEREX) capsule 150 mg, 150 mg, Oral, Daily, Allen Norris, Darren, MD, 150 mg at 04/14/20 1428 .  labetalol (NORMODYNE) injection 5 mg, 5 mg, Intravenous, Q2H PRN, Lucilla Lame, MD, 5 mg at 04/12/20 1752 .  losartan (COZAAR) tablet 100 mg, 100 mg, Oral, Daily, Allen Norris, Darren, MD, 100 mg at 04/14/20 1427 .  MEDLINE mouth rinse, 15 mL, Mouth Rinse, BID, Allen Norris, Darren, MD, 15 mL at 04/13/20 1338 .  ondansetron (ZOFRAN) tablet 4 mg, 4 mg, Oral, Q6H PRN **OR** ondansetron (ZOFRAN) injection 4 mg, 4 mg, Intravenous, Q6H PRN, Lucilla Lame, MD .  pantoprazole (PROTONIX) injection 40 mg, 40 mg, Intravenous, Q12H, Allen Norris, Darren, MD, 40 mg at 04/14/20 1428 .  predniSONE (DELTASONE) tablet 50 mg, 50 mg, Oral, Q breakfast, Lateef, Munsoor, MD, 50 mg at 04/14/20 1446 .  sodium chloride flush (NS) 0.9 % injection 3 mL, 3 mL, Intravenous, Q12H, Allen Norris, Darren, MD, 3 mL at 04/14/20 1430 .  sodium chloride flush (NS) 0.9 % injection 3 mL, 3 mL, Intravenous, PRN, Allen Norris, Darren, MD .  sulfamethoxazole-trimethoprim (BACTRIM) 400-80 MG per tablet 1 tablet, 1 tablet, Oral, Once per day on Mon Wed Fri, Wohl, Darren, MD .  vitamin B-12 (CYANOCOBALAMIN) tablet 1,000 mcg, 1,000 mcg, Oral, Daily, Allen Norris, Darren, MD, 1,000 mcg at 04/14/20  1427    ALLERGIES   Amlodipine, Aspirin, Chlorthalidone, Coreg [carvedilol], and Statins     REVIEW OF SYSTEMS    Review of Systems:  Gen:  Denies  fever, sweats, chills weigh loss  HEENT: Denies blurred vision, double vision, ear pain, eye pain, hearing loss, nose bleeds, sore throat Cardiac:  No dizziness, chest pain or heaviness, chest tightness,edema Resp:   Reports SOB Gi: Denies swallowing difficulty, stomach pain, nausea or vomiting, diarrhea, constipation, bowel incontinence Gu:  Denies bladder incontinence, burning urine Ext:   Denies Joint pain, stiffness or swelling Skin: Denies  skin rash, easy bruising or bleeding or hives Endoc:  Denies polyuria, polydipsia , polyphagia or weight change Psych:   Denies depression, insomnia or hallucinations   Other:  All other systems negative   VS: BP (!) 186/97 (BP Location: Right Arm)   Pulse (!) 101   Temp 98.2 F (36.8 C)   Resp 12   Ht 4\' 11"  (1.499 m)   Wt 64.9 kg   SpO2 91%   BMI 28.88 kg/m      PHYSICAL EXAM    GENERAL:NAD, no fevers, chills, no weakness no fatigue HEAD: Normocephalic, atraumatic.  EYES: Pupils equal, round, reactive to light. Extraocular muscles intact. No scleral icterus.  MOUTH: Moist mucosal membrane. Dentition intact. No abscess noted.  EAR, NOSE, THROAT: Clear without exudates. No external lesions.  NECK: Supple. No thyromegaly. No nodules. No JVD.  PULMONARY: mild rhonchorous breath sounds bilaterally  CARDIOVASCULAR: S1 and S2. Regular rate and rhythm. No murmurs, rubs, or gallops. No edema. Pedal pulses 2+ bilaterally.  GASTROINTESTINAL: Soft, nontender, nondistended. No masses. Positive bowel sounds. No hepatosplenomegaly.  MUSCULOSKELETAL: No swelling, clubbing, or edema. Range of motion full in all extremities.  NEUROLOGIC: Cranial nerves II through XII are intact. No gross focal neurological deficits. Sensation intact. Reflexes intact.  SKIN: No ulceration, lesions, rashes,  or cyanosis. Skin warm and dry. Turgor intact.  PSYCHIATRIC: Mood, affect within normal limits. The patient is awake, alert and oriented x 3. Insight, judgment intact.       IMAGING    DG Chest 1 View  Result Date: 03/18/2020 CLINICAL DATA:  Acute tubular necrosis, history of shortness of breath EXAM: CHEST  1 VIEW COMPARISON:  03/16/2020 FINDINGS: Single frontal view of the chest demonstrates a stable cardiac silhouette. There is persistent central vascular congestion and interstitial prominence. New left basilar consolidation and small left effusion. No pneumothorax. IMPRESSION: 1. Progressive pulmonary edema, with new left pleural effusion. Electronically Signed   By: Randa Ngo M.D.   On: 03/18/2020 19:49   DG Chest 2 View  Result Date: 04/11/2020 CLINICAL DATA:  Shortness of breath. EXAM: CHEST - 2 VIEW COMPARISON:  04/10/2020 FINDINGS: The heart is normal in size. Persistent and slightly more pronounced  perihilar interstitial process likely noncardiogenic perihilar pulmonary edema. Symmetric bilateral infiltrates are also possible but less likely. No pleural effusions. The bony thorax is intact. IMPRESSION: Persistent and slightly more pronounced perihilar interstitial process, likely noncardiogenic perihilar pulmonary edema. Electronically Signed   By: Marijo Sanes M.D.   On: 04/11/2020 11:16   DG Chest 2 View  Result Date: 04/10/2020 CLINICAL DATA:  79 year old female with shortness of breath. EXAM: CHEST - 2 VIEW COMPARISON:  Chest radiograph dated 03/18/2020 FINDINGS: Bilateral patchy nodular and hazy opacities may represent edema or pneumonia. Clinical correlation is recommended. There is no pleural effusion pneumothorax. The cardiac silhouette is within limits. No acute osseous pathology. Degenerative changes of the spine and scoliosis. Atherosclerotic calcification of the aorta. IMPRESSION: Bilateral patchy nodular and hazy opacities may represent edema or pneumonia.  Electronically Signed   By: Anner Crete M.D.   On: 04/10/2020 20:53   DG Chest 2 View  Result Date: 03/16/2020 CLINICAL DATA:  Shortness of breath. EXAM: CHEST - 2 VIEW COMPARISON:  Radiograph 05/01/2017 FINDINGS: Borderline cardiomegaly. Unchanged mediastinal contours. Small bilateral pleural effusions with fluid in the fissures. Interstitial opacities consistent with pulmonary edema with mild Kerley B-lines. No confluent airspace disease. No pneumothorax. Scoliotic curvature in the spine with associated degenerative change. Aortic atherosclerosis. IMPRESSION: Findings consistent with CHF. Pulmonary edema with small pleural effusions. Aortic Atherosclerosis (ICD10-I70.0). Electronically Signed   By: Keith Rake M.D.   On: 03/16/2020 15:59   CT HEAD WO CONTRAST  Result Date: 03/16/2020 CLINICAL DATA:  Shortness of breath, chills and posterior headache. EXAM: CT HEAD WITHOUT CONTRAST TECHNIQUE: Contiguous axial images were obtained from the base of the skull through the vertex without intravenous contrast. COMPARISON:  None. FINDINGS: Brain: There is mild cerebral atrophy with widening of the extra-axial spaces and ventricular dilatation. There are areas of decreased attenuation within the white matter tracts of the supratentorial brain, consistent with microvascular disease changes. Vascular: No hyperdense vessel or unexpected calcification. Skull: Normal. Negative for fracture or focal lesion. Sinuses/Orbits: No acute finding. Other: None. IMPRESSION: 1. Generalized cerebral atrophy. 2. No acute intracranial abnormality. Electronically Signed   By: Virgina Norfolk M.D.   On: 03/16/2020 17:46   CT Chest Wo Contrast  Result Date: 04/11/2020 CLINICAL DATA:  Respiratory illness. EXAM: CT CHEST WITHOUT CONTRAST TECHNIQUE: Multidetector CT imaging of the chest was performed following the standard protocol without IV contrast. COMPARISON:  None. FINDINGS: Cardiovascular: Atherosclerosis of thoracic  aorta is noted without aneurysm formation. Normal cardiac size. No pericardial effusion. Mediastinum/Nodes: No enlarged mediastinal or axillary lymph nodes. Thyroid gland, trachea, and esophagus demonstrate no significant findings. Lungs/Pleura: No pneumothorax or pleural effusion is noted. Diffuse reticular and ground-glass opacities are noted throughout both lungs with peripheral or subpleural sparing. These findings are most consistent with interstitial lung disease, particularly cryptogenic organizing pneumonia. Upper Abdomen: No acute abnormality. Musculoskeletal: Moderate scoliosis of thoracic spine is noted. No acute abnormality is noted. IMPRESSION: 1. Diffuse reticular and ground-glass opacities are noted throughout both lungs with peripheral or subpleural sparing. These findings are most consistent with interstitial lung disease, particularly cryptogenic organizing pneumonia. 2. Moderate scoliosis of thoracic spine. Aortic Atherosclerosis (ICD10-I70.0). Electronically Signed   By: Marijo Conception M.D.   On: 04/11/2020 12:35   US RENAL  Result Date: 03/18/2020 CLINICAL DATA:  Acute tubular necrosis EXAM: RENAL / URINARY TRACT ULTRASOUND COMPLETE COMPARISON:  None. FINDINGS: Right Kidney: Renal measurements: 9 x 5.1 x 4.9 cm = volume: 117 mL. There is increased  echotexture of right kidney. No focal mass or hydronephrosis is identified. Left Kidney: Renal measurements: 11.1 x 5.1 x 4.2 cm = volume: 126 mL. Echogenicity within normal limits. No mass or hydronephrosis visualized. Bladder: Appears normal for degree of bladder distention. Other: None. IMPRESSION: Increased echotexture of right kidney.  Otherwise negative. Electronically Signed   By: Abelardo Diesel M.D.   On: 03/18/2020 15:54   ECHOCARDIOGRAM COMPLETE  Result Date: 03/17/2020    ECHOCARDIOGRAM REPORT   Patient Name:   ANELIA CARRIVEAU Date of Exam: 03/17/2020 Medical Rec #:  [id number]       Height:       59.0 in Accession #:    [id number]       Weight:       158.7 lb Date of Birth:  1941/01/19      BSA:          1.672 m Patient Age:    63 years        BP:           120/59 mmHg Patient Gender: F               HR:           98 bpm. Exam Location:  [facility] Procedure: 2D Echo, Color Doppler and Cardiac Doppler Indications:     R06.00 Dyspnea  History:         Patient has prior history of Echocardiogram examinations.                  Signs/Symptoms:Murmur; Risk Factors:Hypertension and HCL.  Sonographer:     Charmayne Sheer RDCS (AE) Referring Phys:  [id number] BRENDA MORRISON Diagnosing Phys: Isaias Cowman MD IMPRESSIONS  1. Left ventricular ejection fraction, by estimation, is 55 to 60%. The left ventricle has normal function. The left ventricle has no regional wall motion abnormalities. Left ventricular diastolic parameters are consistent with Grade I diastolic dysfunction (impaired relaxation).  2. Right ventricular systolic function is normal. The right ventricular size is normal.  3. The mitral valve is normal in structure. Mild mitral valve regurgitation. No evidence of mitral stenosis.  4. The aortic valve is normal in structure. Aortic valve regurgitation is mild to moderate. 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  5. The inferior vena cava is normal in size with greater than 50% respiratory variability, suggesting right atrial pressure of 3 mmHg. FINDINGS  Left Ventricle: Left ventricular ejection fraction, by estimation, is 55 to 60%. The left ventricle has normal function. The left ventricle has no regional wall motion abnormalities. The left ventricular internal cavity size was normal in size. There is  no left ventricular hypertrophy. Left ventricular diastolic parameters are consistent with Grade I diastolic dysfunction (impaired relaxation). Right Ventricle: The right ventricular size is normal. No increase in right ventricular wall thickness. Right ventricular systolic function is normal. Left Atrium: Left atrial size was normal in size. Right  Atrium: Right atrial size was normal in size. Pericardium: There is no evidence of pericardial effusion. Mitral Valve: The mitral valve is normal in structure. Normal mobility of the mitral valve leaflets. Mild mitral valve regurgitation. No evidence of mitral valve stenosis. MV peak gradient, 7.4 mmHg. The mean mitral valve gradient is 3.0 mmHg. Tricuspid Valve: The tricuspid valve is normal in structure. Tricuspid valve regurgitation is mild . No evidence of tricuspid stenosis. Aortic Valve: The aortic valve is normal in structure. Aortic valve regurgitation is mild to moderate. Moderate to severe aortic stenosis is present. Aortic  valve mean gradient measures 24.5 mmHg. Aortic valve peak gradient measures 43.2 mmHg. Aortic valve area, by VTI measures 0.99 cm. Pulmonic Valve: The pulmonic valve was normal in structure. Pulmonic valve regurgitation is not visualized. No evidence of pulmonic stenosis. Aorta: The aortic root is normal in size and structure. Venous: The inferior vena cava is normal in size with greater than 50% respiratory variability, suggesting right atrial pressure of 3 mmHg. IAS/Shunts: No atrial level shunt detected by color flow Doppler.  LEFT VENTRICLE PLAX 2D LVIDd:         2.76 cm     Diastology LVIDs:         2.18 cm     LV e' lateral:   7.83 cm/s LV PW:         0.94 cm     LV E/e' lateral: 11.0 LV IVS:        0.92 cm     LV e' medial:    4.79 cm/s LVOT diam:     1.70 cm     LV E/e' medial:  17.9 LV SV:         61 LV SV Index:   37 LVOT Area:     2.27 cm  LV Volumes (MOD) LV vol d, MOD A2C: 45.9 ml LV vol d, MOD A4C: 53.5 ml LV vol s, MOD A2C: 18.3 ml LV vol s, MOD A4C: 20.8 ml LV SV MOD A2C:     27.6 ml LV SV MOD A4C:     53.5 ml LV SV MOD BP:      29.5 ml RIGHT VENTRICLE RV Basal diam:  2.94 cm LEFT ATRIUM             Index       RIGHT ATRIUM          Index LA diam:        3.00 cm 1.79 cm/m  RA Area:     7.50 cm LA Vol (A2C):   17.4 ml 10.41 ml/m RA Volume:   11.10 ml 6.64 ml/m LA  Vol (A4C):   34.0 ml 20.34 ml/m LA Biplane Vol: 26.4 ml 15.79 ml/m  AORTIC VALVE                    PULMONIC VALVE AV Area (Vmax):    0.85 cm     PV Vmax:       1.79 m/s AV Area (Vmean):   0.84 cm     PV Vmean:      119.000 cm/s AV Area (VTI):     0.99 cm     PV VTI:        0.339 m AV Vmax:           328.50 cm/s  PV Peak grad:  12.8 mmHg AV Vmean:          230.000 cm/s PV Mean grad:  7.0 mmHg AV VTI:            0.619 m AV Peak Grad:      43.2 mmHg AV Mean Grad:      24.5 mmHg LVOT Vmax:         123.00 cm/s LVOT Vmean:        85.600 cm/s LVOT VTI:          0.270 m LVOT/AV VTI ratio: 0.44  AORTA Ao Root diam: 3.00 cm MITRAL VALVE MV Area (PHT): 3.85 cm     SHUNTS MV Peak grad:  7.4  mmHg     Systemic VTI:  0.27 m MV Mean grad:  3.0 mmHg     Systemic Diam: 1.70 cm MV Vmax:       1.36 m/s MV Vmean:      85.3 cm/s MV Decel Time: 197 msec MV E velocity: 85.90 cm/s MV A velocity: 125.00 cm/s MV E/A ratio:  0.69 Isaias Cowman MD Electronically signed by Isaias Cowman MD Signature Date/Time: 03/17/2020/3:42:25 PM    Final    US BIOPSY (KIDNEY)  Result Date: 03/23/2020 INDICATION: 79 year old female with a history of acute kidney injury EXAM: IMAGE GUIDED RENAL BIOPSY MEDICATIONS: None. ANESTHESIA/SEDATION: Moderate (conscious) sedation was employed during this procedure. A total of Versed 1.0 mg and Fentanyl 50 mcg was administered intravenously. Moderate Sedation Time: 11 minutes. The patient's level of consciousness and vital signs were monitored continuously by radiology nursing throughout the procedure under my direct supervision. FLUOROSCOPY TIME:  Ultrasound COMPLICATIONS: None PROCEDURE: Informed written consent was obtained from the patient after a thorough discussion of the procedural risks, benefits and alternatives. All questions were addressed. Maximal Sterile Barrier Technique was utilized including caps, mask, sterile gowns, sterile gloves, sterile drape, hand hygiene and skin antiseptic. A  timeout was performed prior to the initiation of the procedure. Patient was positioned prone position on the gantry table. Images were stored sent to PACs. Once the patient is prepped and draped in the usual sterile fashion, the skin and subcutaneous tissues overlying the left kidney were generously infiltrated 1% lidocaine for local anesthesia. Using ultrasound guidance, transverse approach, a 15 gauge guide needle was advanced into the lateral cortex of the left kidney. Once we confirmed location of the needle tip, 4 separate 16 gauge core biopsy were achieved. Two Gel-Foam pledgets were infused with a small amount of saline. The needle was removed. Final images were stored. The patient tolerated the procedure well and remained hemodynamically stable throughout. No complications were encountered and no significant blood loss encountered. IMPRESSION: Status post ultrasound-guided medical kidney biopsy of the left kidney. Signed, Dulcy Fanny. Dellia Nims, RPVI [facility] Radiology Electronically Signed   By: Corrie Mckusick D.O.   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    Severe normocytic anemia  -contributing to dyspnea  - unlikely due to Rituxan as other cell lines are within reference range  - intercurrent thrombocytosis - pointing towards chronic blood loss with IDA  -heme/onc is oncase - appreciate input  Discussed with Dr Reesa Chew - patient is on protonix for GI ppx while on steroids - appreciate input  -plan for EGD - Dr Roselyn Reef   Accelerated hypertension -patient has been restarted on  home regimen - I have discussed this with RN -have added norvasc if needed  -recommend not to use hydralazine due to wegners  Thank you for allowing me to participate in the care of this patient.   Patient/Family are satisfied with care plan and all questions have been answered.  This document was prepared using Dragon voice recognition software and may include unintentional dictation errors.     Ottie Glazier, M.D.  Division of [place]

## 2020-04-14 NOTE — Discharge Summary (Signed)
Physician Discharge Summary  Linda Bradley QBH:[id number] DOB: [birth year] DOA: 04/11/2020  PCP: Lynnell Jude, MD  Admit date: 04/11/2020 Discharge date: 04/14/2020  Admitted From: Home Disposition: Home  Recommendations for Outpatient Follow-up:  1. Follow up with PCP in 1-2 weeks 2. Follow-up with pulmonary, nephrology, hematology and rheumatology. 3. Please obtain BMP/CBC in one week 4. 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  She was seen at infusion center for her Rituxan infusion which was recently started due to worsening ANCA associated polyangiitis and renal function.  She was hypoxic in high 80s and was sent to ED for further evaluation. She had Covid in January, since then experiencing worsening shortness of breath.  Had recent admission at [facility] with hypertensive urgency and pulmonary edema and found to have worsening renal function and aortic stenosis. Had her renal biopsy on 03/23/2020 which shows pauci immune glomerulonephritis involving 50% of her kidneys with crescent formation.  She received 3 pulses of Solu-Medrol 500 mg daily and discharged home on prednisone 70 mg daily.  She received her first dose of rituximab on 04/07/2020, received second dose 04/14/19. She will follow up with rheumatology and oncology for further management.  Pulmonary was consulted and they decrease the dose of prednisone to 50 mg daily and added Bactrim for PCP  prophylaxis.  She will follow-up with them as an outpatient.  Patient continue to become hypoxic on room air requiring 4 L all the time.  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  Home oxygen was arranged.  Her renal function remained stable.  Nephrology was following and recommending continuation of prednisone and an outpatient follow-up.  Patient has elevated blood pressure.  Initially lisinopril was discontinued at due to worsening renal function.  She was started on losartan with titration of dose 200 mg daily on discharge after discussing with nephrology.  Hydralazine was discontinued according to recommendations of rheumatology due to worsening pulmonary function.  She will continue with her home dose of atenolol and clonidine.  If blood pressure remains uncontrolled nephrology or her PCP can increase the dose of clonidine if needed.  Patient developed worsening anemia.  Has not history of iron deficiency along with vitamin B12 deficiency.  Patient has CKD and she is also on rituximab which can also cause bone marrow depression.  Patient was started on EPO by nephrology.  She was also given to third of 1 unit of PRBC, she developed worsening flushing requiring early stopping of blood.  She was also given 1 dose of IV iron.  She will follow-up with her primary care physician or hematologist for close monitoring.  Patient was complaining of black-colored stools, she was also on iron supplement.  FOBT was positive and GI was consulted and she underwent EGD which was normal.  She had a recent colonoscopy which was normal. Her vasculitis might be responsible.  She was initially given IV Protonix and discharged on p.o. Protonix as a GI prophylaxis as she is on steroids.  Discharge Diagnoses:  Principal Problem:   Interstitial lung disease (  [place]) Active Problems:   Acute on chronic respiratory failure with hypoxia (HCC)   CKD stage 4 secondary to hypertension ([place])    History of anemia due to chronic kidney disease   Hypertensive urgency   Melena   Anemia, posthemorrhagic, acute  Discharge Instructions  Discharge Instructions    Diet - low sodium heart healthy   Complete by: As directed    Discharge instructions   Complete by: As directed    It was pleasure taking care of you. Please use your oxygen all the time as directed. Please follow-up with your hematologist or primary care physician to have your hemoglobin checked next week. Follow-up with your nephrologist. Follow-up with your pulmonologist and rheumatologist.   Increase activity slowly   Complete by: As directed      Allergies as of 04/14/2020      Reactions   Amlodipine Swelling   Aspirin    Chlorthalidone Diarrhea   Coreg [carvedilol] Other (See Comments)   Patient states it makes her sick. She refuses.   Statins    Other reaction(s): Other (See Comments) Other Reaction: CNS DISORDER      Medication List    STOP taking these medications   hydrALAZINE 50 MG tablet Commonly known as: APRESOLINE   lisinopril 40 MG tablet Commonly known as: ZESTRIL     TAKE these medications   atenolol 25 MG tablet Commonly known as: TENORMIN Take 1 tablet (25 mg total) by mouth daily.   clonazePAM 0.5 MG tablet Commonly known as: KLONOPIN Take 0.5 mg by mouth 3 (three) times daily as needed for anxiety.   cloNIDine 0.1 MG tablet Commonly known as: Catapres Take 1 tablet (0.1 mg total) by mouth 3 (three) times daily. What changed:   when to take this  additional instructions   cyanocobalamin 1000 MCG tablet Take 1 tablet (1,000 mcg total) by mouth daily.   feeding supplement (NEPRO CARB STEADY) Liqd Take 237 mLs by mouth daily.   iron polysaccharides 150 MG capsule Commonly known as: NIFEREX Take 1 capsule (150 mg total) by mouth daily.   losartan 100 MG tablet Commonly known as: COZAAR Take 1 tablet (100 mg total) by mouth daily. Start taking on: April 15, 2020    pantoprazole 40 MG tablet Commonly known as: Protonix Take 1 tablet (40 mg total) by mouth daily.   predniSONE 50 MG tablet Commonly known as: DELTASONE Take 1 tablet (50 mg total) by mouth daily with breakfast. What changed:   medication strength  how much to take   sulfamethoxazole-trimethoprim 400-80 MG tablet Commonly known as: BACTRIM Take 1 tablet by mouth 3 (three) times a week.            Durable Medical Equipment  (From admission, onward)         Start     Ordered   04/14/20 0909  For home use only DME oxygen  Once    Question Answer Comment  Length of Need Lifetime   Mode or (Route) Nasal cannula   Liters per Minute 4   Frequency Continuous (stationary and portable oxygen unit needed)   Oxygen conserving device Yes   Oxygen delivery system Gas      04/14/20 0909          Allergies  Allergen Reactions  . Amlodipine Swelling  . Aspirin   . Chlorthalidone Diarrhea  . Coreg [Carvedilol] Other (See Comments)    Patient states it makes her sick. She refuses.  . Statins  Other reaction(s): Other (See Comments) Other Reaction: CNS DISORDER    Consultations:  Nephrology  Oncology  GI  Rheumatology  Pulmonology  Procedures/Studies: DG Chest 1 View  Result Date: 03/18/2020 CLINICAL DATA:  Acute tubular necrosis, history of shortness of breath EXAM: CHEST  1 VIEW COMPARISON:  03/16/2020 FINDINGS: Single frontal view of the chest demonstrates a stable cardiac silhouette. There is persistent central vascular congestion and interstitial prominence. New left basilar consolidation and small left effusion. No pneumothorax. IMPRESSION: 1. Progressive pulmonary edema, with new left pleural effusion. Electronically Signed   By: Randa Ngo M.D.   On: 03/18/2020 19:49   DG Chest 2 View  Result Date: 04/11/2020 CLINICAL DATA:  Shortness of breath. EXAM: CHEST - 2 VIEW COMPARISON:  04/10/2020 FINDINGS: The heart is normal in size. Persistent and  slightly more pronounced perihilar interstitial process likely noncardiogenic perihilar pulmonary edema. Symmetric bilateral infiltrates are also possible but less likely. No pleural effusions. The bony thorax is intact. IMPRESSION: Persistent and slightly more pronounced perihilar interstitial process, likely noncardiogenic perihilar pulmonary edema. Electronically Signed   By: Marijo Sanes M.D.   On: 04/11/2020 11:16   DG Chest 2 View  Result Date: 04/10/2020 CLINICAL DATA:  79 year old female with shortness of breath. EXAM: CHEST - 2 VIEW COMPARISON:  Chest radiograph dated 03/18/2020 FINDINGS: Bilateral patchy nodular and hazy opacities may represent edema or pneumonia. Clinical correlation is recommended. There is no pleural effusion pneumothorax. The cardiac silhouette is within limits. No acute osseous pathology. Degenerative changes of the spine and scoliosis. Atherosclerotic calcification of the aorta. IMPRESSION: Bilateral patchy nodular and hazy opacities may represent edema or pneumonia. Electronically Signed   By: Anner Crete M.D.   On: 04/10/2020 20:53   DG Chest 2 View  Result Date: 03/16/2020 CLINICAL DATA:  Shortness of breath. EXAM: CHEST - 2 VIEW COMPARISON:  Radiograph 05/01/2017 FINDINGS: Borderline cardiomegaly. Unchanged mediastinal contours. Small bilateral pleural effusions with fluid in the fissures. Interstitial opacities consistent with pulmonary edema with mild Kerley B-lines. No confluent airspace disease. No pneumothorax. Scoliotic curvature in the spine with associated degenerative change. Aortic atherosclerosis. IMPRESSION: Findings consistent with CHF. Pulmonary edema with small pleural effusions. Aortic Atherosclerosis (ICD10-I70.0). Electronically Signed   By: Keith Rake M.D.   On: 03/16/2020 15:59   CT HEAD WO CONTRAST  Result Date: 03/16/2020 CLINICAL DATA:  Shortness of breath, chills and posterior headache. EXAM: CT HEAD WITHOUT CONTRAST TECHNIQUE:  Contiguous axial images were obtained from the base of the skull through the vertex without intravenous contrast. COMPARISON:  None. FINDINGS: Brain: There is mild cerebral atrophy with widening of the extra-axial spaces and ventricular dilatation. There are areas of decreased attenuation within the white matter tracts of the supratentorial brain, consistent with microvascular disease changes. Vascular: No hyperdense vessel or unexpected calcification. Skull: Normal. Negative for fracture or focal lesion. Sinuses/Orbits: No acute finding. Other: None. IMPRESSION: 1. Generalized cerebral atrophy. 2. No acute intracranial abnormality. Electronically Signed   By: Virgina Norfolk M.D.   On: 03/16/2020 17:46   CT Chest Wo Contrast  Result Date: 04/11/2020 CLINICAL DATA:  Respiratory illness. EXAM: CT CHEST WITHOUT CONTRAST TECHNIQUE: Multidetector CT imaging of the chest was performed following the standard protocol without IV contrast. COMPARISON:  None. FINDINGS: Cardiovascular: Atherosclerosis of thoracic aorta is noted without aneurysm formation. Normal cardiac size. No pericardial effusion. Mediastinum/Nodes: No enlarged mediastinal or axillary lymph nodes. Thyroid gland, trachea, and esophagus demonstrate no significant findings. Lungs/Pleura: No pneumothorax or pleural effusion is  noted. Diffuse reticular and ground-glass opacities are noted throughout both lungs with peripheral or subpleural sparing. These findings are most consistent with interstitial lung disease, particularly cryptogenic organizing pneumonia. Upper Abdomen: No acute abnormality. Musculoskeletal: Moderate scoliosis of thoracic spine is noted. No acute abnormality is noted. IMPRESSION: 1. Diffuse reticular and ground-glass opacities are noted throughout both lungs with peripheral or subpleural sparing. These findings are most consistent with interstitial lung disease, particularly cryptogenic organizing pneumonia. 2. Moderate scoliosis of  thoracic spine. Aortic Atherosclerosis (ICD10-I70.0). Electronically Signed   By: Marijo Conception M.D.   On: 04/11/2020 12:35   US RENAL  Result Date: 03/18/2020 CLINICAL DATA:  Acute tubular necrosis EXAM: RENAL / URINARY TRACT ULTRASOUND COMPLETE COMPARISON:  None. FINDINGS: Right Kidney: Renal measurements: 9 x 5.1 x 4.9 cm = volume: 117 mL. There is increased echotexture of right kidney. No focal mass or hydronephrosis is identified. Left Kidney: Renal measurements: 11.1 x 5.1 x 4.2 cm = volume: 126 mL. Echogenicity within normal limits. No mass or hydronephrosis visualized. Bladder: Appears normal for degree of bladder distention. Other: None. IMPRESSION: Increased echotexture of right kidney.  Otherwise negative. Electronically Signed   By: Abelardo Diesel M.D.   On: 03/18/2020 15:54   ECHOCARDIOGRAM COMPLETE  Result Date: 03/17/2020    ECHOCARDIOGRAM REPORT   Patient Name:   Linda Bradley Date of Exam: 03/17/2020 Medical Rec #:  [id number]       Height:       59.0 in Accession #:    [id number]      Weight:       158.7 lb Date of Birth:  [birth year]      BSA:          1.672 m Patient Age:    70 years        BP:           120/59 mmHg Patient Gender: F               HR:           98 bpm. Exam Location:  [facility] Procedure: 2D Echo, Color Doppler and Cardiac Doppler Indications:     R06.00 Dyspnea  History:         Patient has prior history of Echocardiogram examinations.                  Signs/Symptoms:Murmur; Risk Factors:Hypertension and HCL.  Sonographer:     Charmayne Sheer RDCS (AE) Referring Phys:  [id number] BRENDA MORRISON Diagnosing Phys: Isaias Cowman MD IMPRESSIONS  1. Left ventricular ejection fraction, by estimation, is 55 to 60%. The left ventricle has normal function. The left ventricle has no regional wall motion abnormalities. Left ventricular diastolic parameters are consistent with Grade I diastolic dysfunction (impaired relaxation).  2. Right ventricular systolic function is normal. The right  ventricular size is normal.  3. The mitral valve is normal in structure. Mild mitral valve regurgitation. No evidence of mitral stenosis.  4. The aortic valve is normal in structure. Aortic valve regurgitation is mild to moderate. 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  5. The inferior vena cava is normal in size with greater than 50% respiratory variability, suggesting right atrial pressure of 3 mmHg. FINDINGS  Left Ventricle: Left ventricular ejection fraction, by estimation, is 55 to 60%. The left ventricle has normal function. The left ventricle has no regional wall motion abnormalities. The left ventricular internal cavity size was normal in size. There is  no left ventricular hypertrophy.  Left ventricular diastolic parameters are consistent with Grade I diastolic dysfunction (impaired relaxation). Right Ventricle: The right ventricular size is normal. No increase in right ventricular wall thickness. Right ventricular systolic function is normal. Left Atrium: Left atrial size was normal in size. Right Atrium: Right atrial size was normal in size. Pericardium: There is no evidence of pericardial effusion. Mitral Valve: The mitral valve is normal in structure. Normal mobility of the mitral valve leaflets. Mild mitral valve regurgitation. No evidence of mitral valve stenosis. MV peak gradient, 7.4 mmHg. The mean mitral valve gradient is 3.0 mmHg. Tricuspid Valve: The tricuspid valve is normal in structure. Tricuspid valve regurgitation is mild . No evidence of tricuspid stenosis. Aortic Valve: The aortic valve is normal in structure. Aortic valve regurgitation is mild to moderate. Moderate to severe aortic stenosis is present. Aortic valve mean gradient measures 24.5 mmHg. Aortic valve peak gradient measures 43.2 mmHg. Aortic valve area, by VTI measures 0.99 cm. Pulmonic Valve: The pulmonic valve was normal in structure. Pulmonic valve regurgitation is not visualized. No evidence of pulmonic stenosis. Aorta:  The aortic root is normal in size and structure. Venous: The inferior vena cava is normal in size with greater than 50% respiratory variability, suggesting right atrial pressure of 3 mmHg. IAS/Shunts: No atrial level shunt detected by color flow Doppler.  LEFT VENTRICLE PLAX 2D LVIDd:         2.76 cm     Diastology LVIDs:         2.18 cm     LV e' lateral:   7.83 cm/s LV PW:         0.94 cm     LV E/e' lateral: 11.0 LV IVS:        0.92 cm     LV e' medial:    4.79 cm/s LVOT diam:     1.70 cm     LV E/e' medial:  17.9 LV SV:         61 LV SV Index:   37 LVOT Area:     2.27 cm  LV Volumes (MOD) LV vol d, MOD A2C: 45.9 ml LV vol d, MOD A4C: 53.5 ml LV vol s, MOD A2C: 18.3 ml LV vol s, MOD A4C: 20.8 ml LV SV MOD A2C:     27.6 ml LV SV MOD A4C:     53.5 ml LV SV MOD BP:      29.5 ml RIGHT VENTRICLE RV Basal diam:  2.94 cm LEFT ATRIUM             Index       RIGHT ATRIUM          Index LA diam:        3.00 cm 1.79 cm/m  RA Area:     7.50 cm LA Vol (A2C):   17.4 ml 10.41 ml/m RA Volume:   11.10 ml 6.64 ml/m LA Vol (A4C):   34.0 ml 20.34 ml/m LA Biplane Vol: 26.4 ml 15.79 ml/m  AORTIC VALVE                    PULMONIC VALVE AV Area (Vmax):    0.85 cm     PV Vmax:       1.79 m/s AV Area (Vmean):   0.84 cm     PV Vmean:      119.000 cm/s AV Area (VTI):     0.99 cm     PV VTI:  0.339 m AV Vmax:           328.50 cm/s  PV Peak grad:  12.8 mmHg AV Vmean:          230.000 cm/s PV Mean grad:  7.0 mmHg AV VTI:            0.619 m AV Peak Grad:      43.2 mmHg AV Mean Grad:      24.5 mmHg LVOT Vmax:         123.00 cm/s LVOT Vmean:        85.600 cm/s LVOT VTI:          0.270 m LVOT/AV VTI ratio: 0.44  AORTA Ao Root diam: 3.00 cm MITRAL VALVE MV Area (PHT): 3.85 cm     SHUNTS MV Peak grad:  7.4 mmHg     Systemic VTI:  0.27 m MV Mean grad:  3.0 mmHg     Systemic Diam: 1.70 cm MV Vmax:       1.36 m/s MV Vmean:      85.3 cm/s MV Decel Time: 197 msec MV E velocity: 85.90 cm/s MV A velocity: 125.00 cm/s MV E/A ratio:  0.69  Isaias Cowman MD Electronically signed by Isaias Cowman MD Signature Date/Time: 03/17/2020/3:42:25 PM    Final    US BIOPSY (KIDNEY)  Result Date: 03/23/2020 INDICATION: 79 year old female with a history of acute kidney injury EXAM: IMAGE GUIDED RENAL BIOPSY MEDICATIONS: None. ANESTHESIA/SEDATION: Moderate (conscious) sedation was employed during this procedure. A total of Versed 1.0 mg and Fentanyl 50 mcg was administered intravenously. Moderate Sedation Time: 11 minutes. The patient's level of consciousness and vital signs were monitored continuously by radiology nursing throughout the procedure under my direct supervision. FLUOROSCOPY TIME:  Ultrasound COMPLICATIONS: None PROCEDURE: Informed written consent was obtained from the patient after a thorough discussion of the procedural risks, benefits and alternatives. All questions were addressed. Maximal Sterile Barrier Technique was utilized including caps, mask, sterile gowns, sterile gloves, sterile drape, hand hygiene and skin antiseptic. A timeout was performed prior to the initiation of the procedure. Patient was positioned prone position on the gantry table. Images were stored sent to PACs. Once the patient is prepped and draped in the usual sterile fashion, the skin and subcutaneous tissues overlying the left kidney were generously infiltrated 1% lidocaine for local anesthesia. Using ultrasound guidance, transverse approach, a 15 gauge guide needle was advanced into the lateral cortex of the left kidney. Once we confirmed location of the needle tip, 4 separate 16 gauge core biopsy were achieved. Two Gel-Foam pledgets were infused with a small amount of saline. The needle was removed. Final images were stored. The patient tolerated the procedure well and remained hemodynamically stable throughout. No complications were encountered and no significant blood loss encountered. IMPRESSION: Status post ultrasound-guided medical kidney biopsy of the  left kidney. Signed, Dulcy Fanny. Dellia Nims, RPVI [facility] Radiology Electronically Signed   By: Corrie Mckusick D.O.   On: 03/23/2020 11:37     Subjective: Was feeling better when seen today.  She was eager to go home after EGD.  Discharge Exam: Vitals:   04/14/20 1235 04/14/20 1326  BP: (!) 146/58 (!) 186/97  Pulse: 77 87  Resp: 18 18  Temp:  98.3 F (36.8 C)  SpO2: 93% 90%   Vitals:   04/14/20 1215 04/14/20 1225 04/14/20 1235 04/14/20 1326  BP: (!) 117/55 (!) 134/55 (!) 146/58 (!) 186/97  Pulse: 69 76 77  87  Resp: 18 16 18 18   Temp:    98.3 F (36.8 C)  TempSrc:    Oral  SpO2: 98% 91% 93% 90%  Weight:      Height:        General: Pt is alert, awake, not in acute distress Cardiovascular: RRR, S1/S2 +, no rubs, no gallops Respiratory: CTA bilaterally, no wheezing, no rhonchi Abdominal: Soft, NT, ND, bowel sounds + Extremities: no edema, no cyanosis   The results of significant diagnostics from this hospitalization (including imaging, microbiology, ancillary and laboratory) are listed below for reference.    Microbiology: Recent Results (from the past 240 hour(s))  Respiratory Panel by RT PCR (Flu A&B, Covid) - Nasopharyngeal Swab     Status: None   Collection Time: 04/11/20 12:21 PM   Specimen: Nasopharyngeal Swab  Result Value Ref Range Status   SARS Coronavirus 2 by RT PCR NEGATIVE NEGATIVE Final    Comment: (NOTE) SARS-CoV-2 target nucleic acids are NOT DETECTED. The SARS-CoV-2 RNA is generally detectable in upper respiratoy specimens during the acute phase of infection. The lowest concentration of SARS-CoV-2 viral copies this assay can detect is 131 copies/mL. A negative result does not preclude SARS-Cov-2 infection and should not be used as the sole basis for treatment or other patient management decisions. A negative result may occur with  improper specimen collection/handling, submission of specimen  other than nasopharyngeal swab, presence of viral mutation(s) within the areas targeted by this assay, and inadequate number of viral copies (<131 copies/mL). A negative result must be combined with clinical observations, patient history, and epidemiological information. The expected result is Negative. Fact Sheet for Patients:  PinkCheek.be Fact Sheet for Healthcare Providers:  GravelBags.it This test is not yet ap proved or cleared by the [place] FDA and  has been authorized for detection and/or diagnosis of SARS-CoV-2 by FDA under an Emergency Use Authorization (EUA). This EUA will remain  in effect (meaning this test can be used) for the duration of the COVID-19 declaration under Section 564(b)(1) of the Act, 21 U.S.C. section 360bbb-3(b)(1), unless the authorization is terminated or revoked sooner.    Influenza A by PCR NEGATIVE NEGATIVE Final   Influenza B by PCR NEGATIVE NEGATIVE Final    Comment: (NOTE) The Xpert Xpress SARS-CoV-2/FLU/RSV assay is intended as an aid in  the diagnosis of influenza from Nasopharyngeal swab specimens and  should not be used as a sole basis for treatment. Nasal washings and  aspirates are unacceptable for Xpert Xpress SARS-CoV-2/FLU/RSV  testing. Fact Sheet for Patients: PinkCheek.be Fact Sheet for Healthcare Providers: GravelBags.it This test is not yet approved or cleared by the [place] FDA and  has been authorized for detection and/or diagnosis of SARS-CoV-2 by  FDA under an Emergency Use Authorization (EUA). This EUA will remain  in effect (meaning this test can be used) for the duration of the  Covid-19 declaration under Section 564(b)(1) of the Act, 21  U.S.C. section 360bbb-3(b)(1), unless the authorization is  terminated or revoked. Performed at [facility], [address] [place], [place]  [address]      Labs: BNP (last 3 results) Recent Labs    03/17/20 0944  BNP 269.4*   Basic Metabolic Panel: Recent Labs  Lab 04/11/20 1029 04/12/20 0458 04/13/20 0528 04/14/20 0525  NA 135 139 135 136  K 3.5 3.5 2.9* 4.3  CL 101 101 99 104  CO2 23 29 27 24   GLUCOSE 139* 93 104* 125*  BUN 56*  58* 53* 54*  CREATININE 2.15* 2.21* 2.25* 2.26*  CALCIUM 8.8* 8.3* 8.1* 8.3*  MG  --   --  1.7  --   PHOS  --   --  4.1 2.3*   Liver Function Tests: Recent Labs  Lab 04/13/20 0528 04/14/20 0525  ALBUMIN 2.9* 3.0*   No results for input(s): LIPASE, AMYLASE in the last 168 hours. No results for input(s): AMMONIA in the last 168 hours. CBC: Recent Labs  Lab 04/11/20 1029 04/11/20 1029 04/12/20 0458 04/12/20 0934 04/12/20 1536 04/13/20 0528 04/14/20 0525  WBC 17.8*  --  13.8* 13.8*  --  9.1  9.0 11.2*  NEUTROABS  --   --   --   --   --  7.4  --   HGB 7.9*   < > 6.5* 7.4* 7.9* 7.3*  7.4* 7.1*  HCT 22.8*   < > 19.3* 22.0* 23.5* 21.3*  21.1* 21.1*  MCV 86.4  --  88.9 88.4  --  86.2  84.7 89.0  PLT 229  --  187 197  --  178  177 176   < > = values in this interval not displayed.   Cardiac Enzymes: No results for input(s): CKTOTAL, CKMB, CKMBINDEX, TROPONINI in the last 168 hours. BNP: Invalid input(s): POCBNP CBG: No results for input(s): GLUCAP in the last 168 hours. D-Dimer No results for input(s): DDIMER in the last 72 hours. Hgb A1c No results for input(s): HGBA1C in the last 72 hours. Lipid Profile No results for input(s): CHOL, HDL, LDLCALC, TRIG, CHOLHDL, LDLDIRECT in the last 72 hours. Thyroid function studies No results for input(s): TSH, T4TOTAL, T3FREE, THYROIDAB in the last 72 hours.  Invalid input(s): FREET3 Anemia work up No results for input(s): VITAMINB12, FOLATE, FERRITIN, TIBC, IRON, RETICCTPCT in the last 72 hours. Urinalysis    Component Value Date/Time   COLORURINE YELLOW (A) 03/24/2020 1103   APPEARANCEUR CLEAR (A) 03/24/2020 1103    LABSPEC 1.010 03/24/2020 1103   PHURINE 5.0 03/24/2020 1103   GLUCOSEU NEGATIVE 03/24/2020 1103   HGBUR MODERATE (A) 03/24/2020 1103   BILIRUBINUR NEGATIVE 03/24/2020 1103   KETONESUR NEGATIVE 03/24/2020 1103   PROTEINUR 30 (A) 03/24/2020 1103   NITRITE NEGATIVE 03/24/2020 1103   LEUKOCYTESUR NEGATIVE 03/24/2020 1103   Sepsis Labs Invalid input(s): PROCALCITONIN,  WBC,  LACTICIDVEN Microbiology Recent Results (from the past 240 hour(s))  Respiratory Panel by RT PCR (Flu A&B, Covid) - Nasopharyngeal Swab     Status: None   Collection Time: 04/11/20 12:21 PM   Specimen: Nasopharyngeal Swab  Result Value Ref Range Status   SARS Coronavirus 2 by RT PCR NEGATIVE NEGATIVE Final    Comment: (NOTE) SARS-CoV-2 target nucleic acids are NOT DETECTED. The SARS-CoV-2 RNA is generally detectable in upper respiratoy specimens during the acute phase of infection. The lowest concentration of SARS-CoV-2 viral copies this assay can detect is 131 copies/mL. A negative result does not preclude SARS-Cov-2 infection and should not be used as the sole basis for treatment or other patient management decisions. A negative result may occur with  improper specimen collection/handling, submission of specimen other than nasopharyngeal swab, presence of viral mutation(s) within the areas targeted by this assay, and inadequate number of viral copies (<131 copies/mL). A negative result must be combined with clinical observations, patient history, and epidemiological information. The expected result is Negative. Fact Sheet for Patients:  PinkCheek.be Fact Sheet for Healthcare Providers:  GravelBags.it This test is not yet ap proved or cleared by the  [place] Architectural technologist and  has been authorized for detection and/or diagnosis of SARS-CoV-2 by FDA under an Print production planner (EUA). This EUA will remain  in effect (meaning this test can be used) for  the duration of the COVID-19 declaration under Section 564(b)(1) of the Act, 21 U.S.C. section 360bbb-3(b)(1), unless the authorization is terminated or revoked sooner.    Influenza A by PCR NEGATIVE NEGATIVE Final   Influenza B by PCR NEGATIVE NEGATIVE Final    Comment: (NOTE) The Xpert Xpress SARS-CoV-2/FLU/RSV assay is intended as an aid in  the diagnosis of influenza from Nasopharyngeal swab specimens and  should not be used as a sole basis for treatment. Nasal washings and  aspirates are unacceptable for Xpert Xpress SARS-CoV-2/FLU/RSV  testing. Fact Sheet for Patients: PinkCheek.be Fact Sheet for Healthcare Providers: GravelBags.it This test is not yet approved or cleared by the [place] FDA and  has been authorized for detection and/or diagnosis of SARS-CoV-2 by  FDA under an Emergency Use Authorization (EUA). This EUA will remain  in effect (meaning this test can be used) for the duration of the  Covid-19 declaration under Section 564(b)(1) of the Act, 21  U.S.C. section 360bbb-3(b)(1), unless the authorization is  terminated or revoked. Performed at [facility], [address] [place], [place] [address]     Time coordinating discharge: Over 30 minutes  SIGNED:  Lorella Nimrod, MD  [facility] 04/14/2020, 3:11 PM  If 7PM-7AM, please contact night-coverage [email or web address]  This record has been created using Systems analyst. Errors have been sought and corrected,but may not always be located. Such creation errors do not reflect on the standard of care.

## 2020-04-14 NOTE — Progress Notes (Signed)
SATURATION QUALIFICATIONS: (This note is used to comply with regulatory documentation for home oxygen)  Patient Saturations on Room Air at Rest = 81%  Patient Saturations on Room Air while Ambulating = 81%  Patient Saturations on 4 Liters of oxygen while Ambulating = 92%  Please briefly explain why patient needs home oxygen: Interstitial lung disease

## 2020-04-14 NOTE — Progress Notes (Signed)
Discharge instructions reviewed with patient and her daughter. Verbalized understanding. Denies questions.. Discharged with daughter via private vehicle to home. No acute distress noted. Denies complaints. Home oxygen with patient at discharge.

## 2020-04-14 NOTE — Anesthesia Preprocedure Evaluation (Signed)
Anesthesia Evaluation  Patient identified by MRN, date of birth, ID band Patient awake    Reviewed: Allergy & Precautions, NPO status , Patient's Chart, lab work & pertinent test results  History of Anesthesia Complications Negative for: history of anesthetic complications  Airway Mallampati: II  TM Distance: >3 FB Neck ROM: Full    Dental  (+) Implants   Pulmonary shortness of breath, neg sleep apnea, neg COPD,    breath sounds clear to auscultation- rhonchi (-) wheezing      Cardiovascular hypertension, Pt. on medications +CHF (preserved EF)  (-) CAD, (-) Past MI, (-) Cardiac Stents and (-) CABG + Valvular Problems/Murmurs (mod/severe AS) AS  Rhythm:Regular Rate:Normal - Systolic murmurs and - Diastolic murmurs Echo 0/63/01: 1. Left ventricular ejection fraction, by estimation, is 55 to 60%. The  left ventricle has normal function. The left ventricle has no regional  wall motion abnormalities. Left ventricular diastolic parameters are  consistent with Grade I diastolic  dysfunction (impaired relaxation).  2. Right ventricular systolic function is normal. The right ventricular  size is normal.  3. The mitral valve is normal in structure. Mild mitral valve  regurgitation. No evidence of mitral stenosis.  4. The aortic valve is normal in structure. Aortic valve regurgitation is  mild to moderate. 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  5. The inferior vena cava is normal in size with greater than 50%  respiratory variability, suggesting right atrial pressure of 3 mmHg.    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) No date: Heart murmur No date: Hypercholesterolemia No date: Hypertension No date: Lymphadenitis No date: Skin cancer No date: Spontaneous ecchymoses No date: Vitamin D deficiency   Reproductive/Obstetrics                             Anesthesia Physical Anesthesia Plan  ASA: III  Anesthesia Plan: General   Post-op Pain Management:    Induction: Intravenous  PONV Risk Score and Plan: 2 and Propofol infusion  Airway Management Planned: Natural Airway  Additional Equipment:   Intra-op Plan:   Post-operative Plan:   Informed Consent: I have reviewed the patients History and Physical, chart, labs and discussed the procedure including the risks, benefits and alternatives for the proposed anesthesia with the patient or authorized representative who has indicated his/her understanding and acceptance.     Dental advisory given  Plan Discussed with: CRNA and Anesthesiologist  Anesthesia Plan Comments:         Anesthesia Quick Evaluation

## 2020-04-14 NOTE — Anesthesia Postprocedure Evaluation (Signed)
Anesthesia Post Note  Patient: Linda Bradley  Procedure(s) Performed: ESOPHAGOGASTRODUODENOSCOPY (EGD) WITH PROPOFOL (N/A )  Patient location during evaluation: Endoscopy Anesthesia Type: General Level of consciousness: awake and alert and oriented Pain management: pain level controlled Vital Signs Assessment: post-procedure vital signs reviewed and stable Respiratory status: spontaneous breathing, nonlabored ventilation and respiratory function stable Cardiovascular status: blood pressure returned to baseline and stable Postop Assessment: no signs of nausea or vomiting Anesthetic complications: no     Last Vitals:  Vitals:   04/14/20 1225 04/14/20 1235  BP: (!) 134/55 (!) 146/58  Pulse: 76 77  Resp: 16 18  Temp:    SpO2: 91% 93%    Last Pain:  Vitals:   04/14/20 1235  TempSrc:   PainSc: 0-No pain                 Arah Aro

## 2020-04-14 NOTE — Op Note (Signed)
[facility] Gastroenterology Patient Name: Linda Bradley Procedure Date: 04/14/2020 11:38 AM MRN: [id number] Account #: [id number] Date of Birth: Apr 26, 1941 Admit Type: Outpatient Age: 79 Room: [facility] ENDO ROOM 4 Gender: Female Note Status: Finalized Procedure:             Upper GI endoscopy Indications:           Melena Providers:             Lucilla Lame MD, MD Medicines:             Propofol per Anesthesia Complications:         No immediate complications. Procedure:             Pre-Anesthesia Assessment:                        - Prior to the procedure, a History and Physical was                         performed, and patient medications and allergies were                         reviewed. The patient's tolerance of previous                         anesthesia was also reviewed. The risks and benefits                         of the procedure and the sedation options and risks                         were discussed with the patient. All questions were                         answered, and informed consent was obtained. Prior                         Anticoagulants: The patient has taken no previous                         anticoagulant or antiplatelet agents. ASA Grade                         Assessment: II - A patient with mild systemic disease.                         After reviewing the risks and benefits, the patient                         was deemed in satisfactory condition to undergo the                         procedure.                        After obtaining informed consent, the endoscope was                         passed under direct vision. Throughout the procedure,  the patient's blood pressure, pulse, and oxygen                         saturations were monitored continuously. The Endoscope                         was introduced through the mouth, and advanced to the                         second part of duodenum. The upper GI  endoscopy was                         accomplished without difficulty. The patient tolerated                         the procedure well. Findings:      The examined esophagus was normal.      The stomach was normal.      The examined duodenum was normal. Impression:            - Normal esophagus.                        - Normal stomach.                        - Normal examined duodenum.                        - No specimens collected. Recommendation:        - Return patient to hospital ward for ongoing care.                        - Resume previous diet.                        - Continue present medications. Procedure Code(s):     --- Professional ---                        [phone], Esophagogastroduodenoscopy, flexible,                         transoral; diagnostic, including collection of                         specimen(s) by brushing or washing, when performed                         (separate procedure) Diagnosis Code(s):     --- Professional ---                        K92.1, Melena (includes Hematochezia) CPT copyright 2019 American Medical Association. All rights reserved. The codes documented in this report are preliminary and upon coder review may  be revised to meet current compliance requirements. Lucilla Lame MD, MD 04/14/2020 12:00:03 PM This report has been signed electronically. Number of Addenda: 0 Note Initiated On: 04/14/2020 11:38 AM Estimated Blood Loss:  Estimated blood loss: none.      [facility]

## 2020-04-14 NOTE — Transfer of Care (Signed)
Immediate Anesthesia Transfer of Care Note  Patient: Linda Bradley  Procedure(s) Performed: ESOPHAGOGASTRODUODENOSCOPY (EGD) WITH PROPOFOL (N/A )  Patient Location: Endoscopy Unit  Anesthesia Type:General  Level of Consciousness: drowsy  Airway & Oxygen Therapy: Patient Spontanous Breathing, Face Mask Oxygen  Post-op Assessment: Report given to RN and Post -op Vital signs reviewed and stable  Post vital signs: Reviewed and stable  Last Vitals:  Vitals Value Taken Time  BP    Temp    Pulse    Resp    SpO2      Last Pain:  Vitals:   04/14/20 1102  TempSrc: Temporal  PainSc:       Patients Stated Pain Goal: 0 (82/50/53 [id number])  Complications: No apparent anesthesia complications

## 2020-04-16 LAB — MPO/PR-3 (ANCA) ANTIBODIES
ANCA Proteinase 3: <3.5 U/mL (ref 0.0–3.5)
Myeloperoxidase Abs: 12.3 U/mL — ABNORMAL HIGH (ref 0.0–9.0)

## 2020-06-09 ENCOUNTER — Telehealth: Payer: Self-pay | Admitting: *Deleted

## 2020-06-09 NOTE — Telephone Encounter (Signed)
Patient called reporting that she had Rituxan treatment in April and that she is to have the [place] and her kidney doctor wanted her to check with Dr Grayland Ormond to see if there are any contraindications of getting vaccinated or not. Please advise

## 2020-06-09 NOTE — Telephone Encounter (Signed)
No.  Ok to vaccinate.

## 2020-06-09 NOTE — Telephone Encounter (Signed)
Patient notified of doctor response

## 2020-07-17 ENCOUNTER — Other Ambulatory Visit: Payer: Self-pay

## 2020-07-17 ENCOUNTER — Emergency Department: Payer: Medicare HMO

## 2020-07-17 ENCOUNTER — Encounter: Payer: Self-pay | Admitting: Emergency Medicine

## 2020-07-17 ENCOUNTER — Emergency Department
Admission: EM | Admit: 2020-07-17 | Discharge: 2020-07-17 | Disposition: A | Discharge status: Home / Self Care | Payer: Medicare HMO | Attending: Emergency Medicine | Admitting: Emergency Medicine

## 2020-07-17 DIAGNOSIS — M79604 Pain in right leg: Secondary | ICD-10-CM

## 2020-07-17 DIAGNOSIS — I509 Heart failure, unspecified: Secondary | ICD-10-CM | POA: Insufficient documentation

## 2020-07-17 DIAGNOSIS — Z85828 Personal history of other malignant neoplasm of skin: Secondary | ICD-10-CM | POA: Insufficient documentation

## 2020-07-17 DIAGNOSIS — M79661 Pain in right lower leg: Secondary | ICD-10-CM | POA: Diagnosis not present

## 2020-07-17 DIAGNOSIS — Z79899 Other long term (current) drug therapy: Secondary | ICD-10-CM | POA: Insufficient documentation

## 2020-07-17 DIAGNOSIS — M25551 Pain in right hip: Secondary | ICD-10-CM | POA: Diagnosis not present

## 2020-07-17 DIAGNOSIS — I10 Essential (primary) hypertension: Secondary | ICD-10-CM

## 2020-07-17 DIAGNOSIS — N184 Chronic kidney disease, stage 4 (severe): Secondary | ICD-10-CM | POA: Insufficient documentation

## 2020-07-17 DIAGNOSIS — I130 Hypertensive heart and chronic kidney disease with heart failure and stage 1 through stage 4 chronic kidney disease, or unspecified chronic kidney disease: Secondary | ICD-10-CM | POA: Diagnosis present

## 2020-07-17 HISTORY — DX: Disorder of kidney and ureter, unspecified: N28.9

## 2020-07-17 MED ORDER — DEXAMETHASONE SODIUM PHOSPHATE 10 MG/ML IJ SOLN
10.0000 mg | Freq: Once | INTRAMUSCULAR | Status: AC
  Administered 2020-07-17: 10 mg via INTRAMUSCULAR
  Filled 2020-07-17: qty 1

## 2020-07-17 MED ORDER — HYDROCODONE-ACETAMINOPHEN 5-325 MG PO TABS: 1.0000 | ORAL_TABLET | ORAL | 0 refills | Status: DC | PRN

## 2020-07-17 MED ORDER — PREDNISONE 10 MG PO TABS: 10.0000 mg | ORAL_TABLET | Freq: Every day | ORAL | 0 refills | Status: DC

## 2020-07-17 MED ORDER — METHOCARBAMOL 500 MG PO TABS: 500.0000 mg | ORAL_TABLET | Freq: Four times a day (QID) | ORAL | 0 refills | Status: DC

## 2020-07-17 NOTE — ED Notes (Signed)
See triage note  Presents s/p fall 2 weeks ago  Is having pain to right hip and into upper leg  Also was told that her b/p was up at MD[facility]

## 2020-07-17 NOTE — ED Triage Notes (Signed)
Pt in via POV, sent over from routine nephrology visit due to hypertension, reports 232/122.  Clonodine dose given in office in addition to routine dose.  Pt vitals WDL, NAD noted.

## 2020-07-17 NOTE — ED Provider Notes (Signed)
[facility] Emergency Department Provider Note  ____________________________________________  Time seen: Approximately 3:44 PM  I have reviewed the triage vital signs and the nursing notes.   HISTORY  Chief Complaint Hypertension    HPI Linda Bradley is a 79 y.o. female who presents to the emergency department complaining of elevated blood pressure, right hip and leg pain.  Patient states that she has difficulty controlling her blood pressure after her COVID-19 infection.  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  Patient has been following with nephrology for her ongoing kidney issues.  She is on 3 antihypertensive medications including 3 times a day clonidine.  Patient states that she has had progressive pain in her right lower extremity from the posterior knee now extending into the thigh and buttocks region.  No history of low back issues.  No trauma to the area.  Patient states that the pain is exquisite with any ambulation.  She states that today she was in significant amounts of pain when she showed up at her nephrology office as she had to walk a long distance from where her car was parked to the office.  She states that when they took her blood pressure it was 232/122.  They gave her another dose of clonidine in the office and sent her to the emergency department for evaluation.  She states in the short period that it took to get her into the emergency department her blood pressure had responded appropriately.  It was 160/80 when she arrived.  Patient states that after she was no longer ambulating the pain decreased significantly as well.  Patient with no swelling or edema of the lower extremity.  She denies any headache, visual changes, chest pain, shortness of breath, domino pain, nausea vomiting, diarrhea or constipation currently.         Past Medical History:  Diagnosis Date  . Anxiety disorder   . Dermatitis   .  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 Heart murmur   . Hypercholesterolemia   . Hypertension   . Lymphadenitis   . Renal disorder   . Skin cancer   . Spontaneous ecchymoses   . Vitamin D deficiency     Patient Active Problem List   Diagnosis Date Noted  . Melena   . Anemia, posthemorrhagic, acute   . Acute on chronic respiratory failure with hypoxia ([place]) 04/11/2020  . 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 Interstitial lung disease ([place]) 04/11/2020  . History of anemia due to chronic kidney disease 04/11/2020  . Hypertensive urgency 04/11/2020  . 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 Glomerulonephritis, acute   . Acute congestive heart failure ([place])   . AKI (acute kidney injury) ([place])   . Anemia of chronic disease   . Dyspnea 03/16/2020  . Hypokalemia 03/14/2016  . Hyponatremia 03/14/2016  . Aortic stenosis, mild 05/26/2015  . Abnormal EKG 04/14/2015  . Dyspnea on exertion 04/14/2015  . Accelerated essential hypertension 04/14/2015    Past Surgical History:  Procedure Laterality Date  . CARPAL TUNNEL RELEASE     [facility]  . ESOPHAGOGASTRODUODENOSCOPY (EGD) WITH PROPOFOL N/A 04/14/2020   Procedure: ESOPHAGOGASTRODUODENOSCOPY (EGD) WITH PROPOFOL;  Surgeon: Lucilla Lame, MD;  Location: [facility] ENDOSCOPY;  Service: Endoscopy;  Laterality: N/A;  . TONSILLECTOMY    . TRANSTHORACIC ECHOCARDIOGRAM  05/01/2015    EF 60-65%. Abnormal relaxation. Mild aortic stenosis (peak gradient 19 mmHg).    Prior to Admission medications  Medication Sig Start Date End Date Taking? Authorizing Provider  atenolol (TENORMIN) 25 MG tablet Take 1 tablet (25 mg total) by mouth daily. 03/29/20   Fritzi Mandes, MD  clonazePAM (KLONOPIN) 0.5 MG tablet Take 0.5 mg by mouth 3 (three) times daily as needed for anxiety.     [provider]  cloNIDine (CATAPRES) 0.1 MG tablet Take 1 tablet (0.1 mg total) by mouth 3 (three) times daily. Patient taking  differently: Take 0.1 mg by mouth 2 (two) times daily. Take and extra one if >160 if needed 05/01/17 03/16/20  Delman Kitten, MD  HYDROcodone-acetaminophen (NORCO/VICODIN) 5-325 MG tablet Take 1 tablet by mouth every 4 (four) hours as needed for moderate pain. 07/17/20   Khrystina Bonnes, Charline Bills, PA-C  iron polysaccharides (NIFEREX) 150 MG capsule Take 1 capsule (150 mg total) by mouth daily. 03/28/20   Fritzi Mandes, MD  losartan (COZAAR) 100 MG tablet Take 1 tablet (100 mg total) by mouth daily. 04/15/20   Lorella Nimrod, MD  methocarbamol (ROBAXIN) 500 MG tablet Take 1 tablet (500 mg total) by mouth 4 (four) times daily. 07/17/20   Brasen Bundren, Charline Bills, PA-C  Nutritional Supplements (FEEDING SUPPLEMENT, NEPRO CARB STEADY,) LIQD Take 237 mLs by mouth daily. 03/29/20   Fritzi Mandes, MD  pantoprazole (PROTONIX) 40 MG tablet Take 1 tablet (40 mg total) by mouth daily. 04/14/20 04/14/21  Lorella Nimrod, MD  predniSONE (DELTASONE) 10 MG tablet Take 1 tablet (10 mg total) by mouth daily. 07/17/20   Kentaro Alewine, Charline Bills, PA-C  predniSONE (DELTASONE) 50 MG tablet Take 1 tablet (50 mg total) by mouth daily with breakfast. 04/14/20   Lorella Nimrod, MD  sulfamethoxazole-trimethoprim (BACTRIM) 400-80 MG tablet Take 1 tablet by mouth 3 (three) times a week. 04/14/20   Lorella Nimrod, MD  vitamin B-12 1000 MCG tablet Take 1 tablet (1,000 mcg total) by mouth daily. 03/28/20   Fritzi Mandes, MD    Allergies Amlodipine, Coreg [carvedilol], Statins, and Chlorthalidone  Family History  Problem Relation Age of Onset  . Heart disease Father   . Heart failure Father     Social History Social History   Tobacco Use  . Smoking status: Never Smoker  . Smokeless tobacco: Never Used  Vaping Use  . Vaping Use: Never used  Substance Use Topics  . Alcohol use: No  . Drug use: No     Review of Systems   Constitutional: No fever/chills.  Elevated blood pressure reading Eyes: No visual changes. No discharge ENT: No upper respiratory  complaints. Cardiovascular: no chest pain. Respiratory: no cough. No SOB. Gastrointestinal: No abdominal pain.  No nausea, no vomiting.  No diarrhea.  No constipation. Musculoskeletal: Right leg pain Skin: Negative for rash, abrasions, lacerations, ecchymosis. Neurological: Negative for headaches, focal weakness or numbness. 10-point ROS otherwise negative.  ____________________________________________   PHYSICAL EXAM:  VITAL SIGNS: ED Triage Vitals  Enc Vitals Group     BP 07/17/20 1432 (!) 161/82     Pulse Rate 07/17/20 1432 74     Resp 07/17/20 1432 16     Temp 07/17/20 1432 98.5 F (36.9 C)     Temp Source 07/17/20 1432 Oral     SpO2 07/17/20 1432 97 %     Weight 07/17/20 1434 147 lb (66.7 kg)     Height 07/17/20 1434 5' (1.524 m)     Head Circumference --      Peak Flow --      Pain Score 07/17/20 1434 0  Pain Loc --      Pain Edu? --      Excl. in [place]? --      Constitutional: Alert and oriented. Well appearing and in no acute distress. Eyes: Conjunctivae are normal. PERRL. EOMI. Head: Atraumatic. ENT:      Ears:       Nose: No congestion/rhinnorhea.      Mouth/Throat: Mucous membranes are moist.  Neck: No stridor.    Cardiovascular: Normal rate, regular rhythm. Normal S1 and S2.  Good peripheral circulation. Respiratory: Normal respiratory effort without tachypnea or retractions. Lungs CTAB. Good air entry to the bases with no decreased or absent breath sounds. Gastrointestinal: Bowel sounds 4 quadrants. Soft and nontender to palpation. No guarding or rigidity. No palpable masses. No distention. No CVA tenderness. Musculoskeletal: Full range of motion to all extremities. No gross deformities appreciated.  Visualization of the right lower extremity reveals no erythema, gross edema.  Limited range of motion of the hip and knee due to pain.  No palpable abnormalities about the hip, femur, knee.  Patient is tender palpation in the greater trochanter region extending  into the right buttocks.  No tenderness about the knee.  No calf or lower extremity tenderness.  Dorsalis pedis pulse and sensation intact distally.   Neurologic:  Normal speech and language. No gross focal neurologic deficits are appreciated.  Skin:  Skin is warm, dry and intact. No rash noted. Psychiatric: Mood and affect are normal. Speech and behavior are normal. Patient exhibits appropriate insight and judgement.   ____________________________________________   LABS (all labs ordered are listed, but only abnormal results are displayed)  Labs Reviewed - No data to display ____________________________________________  EKG   ____________________________________________  RADIOLOGY I personally viewed and evaluated these images as part of my medical decision making, as well as reviewing the written report by the radiologist.  DG Lumbar Spine 2-3 Views  Result Date: 07/17/2020 CLINICAL DATA:  Back pain history of fall EXAM: LUMBAR SPINE - 2-3 VIEW COMPARISON:  None. FINDINGS: Moderate levoscoliosis. Aortic atherosclerosis. Vertebral body heights grossly maintained. Moderate degenerative changes at multiple levels. Facet degenerative change at multiple levels. IMPRESSION: Scoliosis and degenerative changes. No definite acute osseous abnormality. Electronically Signed   By: Donavan Foil M.D.   On: 07/17/2020 17:48   US Venous Img Lower Unilateral Right  Result Date: 07/17/2020 CLINICAL DATA:  Right posterolateral thigh pain for the past 2 weeks. Evaluate for DVT. EXAM: RIGHT LOWER EXTREMITY VENOUS DOPPLER ULTRASOUND TECHNIQUE: Gray-scale sonography with graded compression, as well as color Doppler and duplex ultrasound were performed to evaluate the lower extremity deep venous systems from the level of the common femoral vein and including the common femoral, femoral, profunda femoral, popliteal and calf veins including the posterior tibial, peroneal and gastrocnemius veins when visible.  The superficial great saphenous vein was also interrogated. Spectral Doppler was utilized to evaluate flow at rest and with distal augmentation maneuvers in the common femoral, femoral and popliteal veins. COMPARISON:  None. FINDINGS: Contralateral Common Femoral Vein: Respiratory phasicity is normal and symmetric with the symptomatic side. No evidence of thrombus. Normal compressibility. Common Femoral Vein: No evidence of thrombus. Normal compressibility, respiratory phasicity and response to augmentation. Saphenofemoral Junction: No evidence of thrombus. Normal compressibility and flow on color Doppler imaging. Profunda Femoral Vein: No evidence of thrombus. Normal compressibility and flow on color Doppler imaging. Femoral Vein: No evidence of thrombus. Normal compressibility, respiratory phasicity and response to augmentation. Popliteal Vein: No evidence of thrombus. Normal  compressibility, respiratory phasicity and response to augmentation. Calf Veins: No evidence of thrombus. Normal compressibility and flow on color Doppler imaging. Superficial Great Saphenous Vein: No evidence of thrombus. Normal compressibility. Venous Reflux:  None. Other Findings: There is no sonographic correlate for patient's area of discomfort involving the posterior lateral aspect of the right thigh and hip (images 29 and 30). IMPRESSION: 1. No evidence of DVT within the right lower extremity. 2. No sonographic correlate for patient's area of discomfort involving the posterolateral aspect of the right thigh and hip. Electronically Signed   By: Sandi Mariscal M.D.   On: 07/17/2020 17:02   DG Knee Complete 4 Views Right  Result Date: 07/17/2020 CLINICAL DATA:  Right leg pain history of fall EXAM: RIGHT KNEE - COMPLETE 4+ VIEW COMPARISON:  None. FINDINGS: No fracture or malalignment. Mild tricompartment arthritis. No large knee effusion. Probable subarticular cyst within the medial tibia. Mild vascular calcifications. IMPRESSION: No  acute osseous abnormality. Electronically Signed   By: Donavan Foil M.D.   On: 07/17/2020 17:47   DG Hip Unilat W or Wo Pelvis 2-3 Views Right  Result Date: 07/17/2020 CLINICAL DATA:  Worsening right hip and leg pain status post fall 2 weeks ago EXAM: DG HIP (WITH OR WITHOUT PELVIS) 2-3V RIGHT COMPARISON:  None. FINDINGS: There is no evidence of hip fracture or dislocation. There is no evidence of arthropathy or other focal bone abnormality. IMPRESSION: Negative. Electronically Signed   By: Donavan Foil M.D.   On: 07/17/2020 17:46    ____________________________________________    PROCEDURES  Procedure(s) performed:    Procedures    Medications  dexamethasone (DECADRON) injection 10 mg (has no administration in time range)     ____________________________________________   INITIAL IMPRESSION / ASSESSMENT AND PLAN / ED COURSE  Pertinent labs & imaging results that were available during my care of the patient were reviewed by me and considered in my medical decision making (see chart for details).  Review of the [place] CSRS was performed in accordance of the [place] prior to dispensing any controlled drugs.           Patient's diagnosis is consistent with hypertension, leg pain.  Patient presents emergency department 2 complaints.  Patient been sent to the emergency department for evaluation of her hypertension.  Patient states that she had gone to her nephrologist office, her blood pressure was 230/120 there.  Patient was given clonidine which she takes on a daily basis and sent to the emergency department.  Patient states that she believes it was secondary to the pain that she was experiencing in her leg.  She has been having difficulty and pain with ambulation and states that the walk from her car to the nephrologist office significantly increased her pain levels.  Patient states that the sooner she was more comfortable and had taken her blood pressure medication her blood pressure  had returned to 160/80 which is patient's typical.  She denied any other complaints of headache, visual changes, chest pain, shortness of breath abdominal pain, nausea vomiting.  Patient had no bowel or bladder dysfunction, saddle anesthesia or paresthesias.  No back pain.  Pain was localized to her hip, femur region.  Patient has had significant issues with inflammation, vasculitis after her Covid illness in January.  I image the patient with x-ray and ultrasound to look for fractures, significant arthritis, blood clots.  Imaging was reassuring.  Patient's exam remains reassuring at this time.  I feel that symptoms are likely more trochanteric  bursitis but may also be lumbar radiculopathy.  Patient will be treated with steroids, muscle relaxer, pain medication.  Patient should follow her blood pressure at home.  If she has a return of significantly elevated blood pressure readings and no other symptoms she should be evaluated for her blood pressure.  She also has a cardiology appointment in 2 days and is instructed to keep same.  Return precautions discussed at length..  Follow-up primary care, cardiology, nephrology as needed.  Patient is given ED precautions to return to the ED for any worsening or new symptoms.     ____________________________________________  FINAL CLINICAL IMPRESSION(S) / ED DIAGNOSES  Final diagnoses:  Hypertension, unspecified type  Pain of right lower extremity      NEW MEDICATIONS STARTED DURING THIS VISIT:  ED Discharge Orders         Ordered    predniSONE (DELTASONE) 10 MG tablet  Daily     Discontinue  Reprint    Note to Pharmacy: Take 6 pills x 2 days, 5 pills x 2 days, 4 pills x 2 days, 3 pills x 2 days, 2 pills x 2 days, and 1 pill x 2 days   07/17/20 1833    methocarbamol (ROBAXIN) 500 MG tablet  4 times daily     Discontinue  Reprint     07/17/20 1833    HYDROcodone-acetaminophen (NORCO/VICODIN) 5-325 MG tablet  Every 4 hours PRN     Discontinue  Reprint      07/17/20 1833              This chart was dictated using voice recognition software/Dragon. Despite best efforts to proofread, errors can occur which can change the meaning. Any change was purely unintentional.    Darletta Moll, PA-C 07/17/20 1842    Arta Silence, MD 07/17/20 2045

## 2020-08-01 ENCOUNTER — Other Ambulatory Visit: Payer: Self-pay | Admitting: Orthopedic Surgery

## 2020-08-01 DIAGNOSIS — M5442 Lumbago with sciatica, left side: Secondary | ICD-10-CM

## 2020-08-01 DIAGNOSIS — M5441 Lumbago with sciatica, right side: Secondary | ICD-10-CM

## 2020-08-01 DIAGNOSIS — M4807 Spinal stenosis, lumbosacral region: Secondary | ICD-10-CM

## 2020-08-02 ENCOUNTER — Other Ambulatory Visit: Payer: Self-pay

## 2020-08-02 ENCOUNTER — Ambulatory Visit
Admission: RE | Admit: 2020-08-02 | Discharge: 2020-08-02 | Disposition: A | Discharge status: Home / Self Care | Payer: Medicare HMO | Source: Ambulatory Visit | Attending: Orthopedic Surgery | Admitting: Orthopedic Surgery

## 2020-08-02 DIAGNOSIS — M5441 Lumbago with sciatica, right side: Secondary | ICD-10-CM

## 2020-08-02 DIAGNOSIS — M4807 Spinal stenosis, lumbosacral region: Secondary | ICD-10-CM

## 2020-08-02 DIAGNOSIS — M5442 Lumbago with sciatica, left side: Secondary | ICD-10-CM | POA: Diagnosis present

## 2020-08-15 ENCOUNTER — Emergency Department
Admission: EM | Admit: 2020-08-15 | Discharge: 2020-08-15 | Disposition: A | Discharge status: Home / Self Care | Payer: Medicare HMO | Attending: Emergency Medicine | Admitting: Emergency Medicine

## 2020-08-15 ENCOUNTER — Other Ambulatory Visit: Payer: Self-pay

## 2020-08-15 DIAGNOSIS — K625 Hemorrhage of anus and rectum: Secondary | ICD-10-CM | POA: Diagnosis present

## 2020-08-15 DIAGNOSIS — Z79899 Other long term (current) drug therapy: Secondary | ICD-10-CM | POA: Diagnosis not present

## 2020-08-15 DIAGNOSIS — I10 Essential (primary) hypertension: Secondary | ICD-10-CM

## 2020-08-15 DIAGNOSIS — I130 Hypertensive heart and chronic kidney disease with heart failure and stage 1 through stage 4 chronic kidney disease, or unspecified chronic kidney disease: Secondary | ICD-10-CM | POA: Insufficient documentation

## 2020-08-15 DIAGNOSIS — N184 Chronic kidney disease, stage 4 (severe): Secondary | ICD-10-CM | POA: Insufficient documentation

## 2020-08-15 DIAGNOSIS — F419 Anxiety disorder, unspecified: Secondary | ICD-10-CM | POA: Diagnosis not present

## 2020-08-15 DIAGNOSIS — I509 Heart failure, unspecified: Secondary | ICD-10-CM | POA: Diagnosis not present

## 2020-08-15 DIAGNOSIS — Z85820 Personal history of malignant melanoma of skin: Secondary | ICD-10-CM | POA: Insufficient documentation

## 2020-08-15 DIAGNOSIS — K921 Melena: Secondary | ICD-10-CM | POA: Diagnosis not present

## 2020-08-15 LAB — BASIC METABOLIC PANEL
Anion gap: 9 (ref 5–15)
BUN: 22 mg/dL (ref 8–23)
CO2: 31 mmol/L (ref 22–32)
Calcium: 9 mg/dL (ref 8.9–10.3)
Chloride: 95 mmol/L — ABNORMAL LOW (ref 98–111)
Creatinine, Ser: 1.36 mg/dL — ABNORMAL HIGH (ref 0.44–1.00)
GFR calc Af Amer: 43 mL/min — ABNORMAL LOW (ref >60)
GFR calc non Af Amer: 37 mL/min — ABNORMAL LOW (ref >60)
Glucose, Bld: 161 mg/dL — ABNORMAL HIGH (ref 70–99)
Potassium: 4.2 mmol/L (ref 3.5–5.1)
Sodium: 135 mmol/L (ref 135–145)

## 2020-08-15 LAB — CBC
HCT: 39.7 % (ref 36.0–46.0)
Hemoglobin: 13.9 g/dL (ref 12.0–15.0)
MCH: 29.9 pg (ref 26.0–34.0)
MCHC: 35 g/dL (ref 30.0–36.0)
MCV: 85.4 fL (ref 80.0–100.0)
Platelets: 338 10*3/uL (ref 150–400)
RBC: 4.65 MIL/uL (ref 3.87–5.11)
RDW: 13.3 % (ref 11.5–15.5)
WBC: 11.9 10*3/uL — ABNORMAL HIGH (ref 4.0–10.5)
nRBC: 0 % (ref 0.0–0.2)

## 2020-08-15 NOTE — ED Triage Notes (Addendum)
Pt comes via POV from home with c/o dark stools and hypertension. Pt states unsure if blood but stools are dark. Pt denies any blood thinners. Pt denies any falls  Current Bp -178/74. Pt states she has taken BP medication for today. Pt states slight headache but that she keeps one. Pt states little blurry vision. Mini neuro negative.  Pt states dark stools this morning and very loose.

## 2020-08-15 NOTE — ED Provider Notes (Signed)
[facility] Emergency Department Provider Note ____________________________________________   First MD Initiated Contact with Patient 08/15/20 1531     (approximate)  I have reviewed the triage vital signs and the nursing notes.  HISTORY  Chief Complaint Rectal Bleeding and Hypertension   HPI Linda Bradley is a 79 y.o. femalewho presents to the ED for evaluation of rectal bleeding and chronic hypertension.  Chart review indicates history of HTN on 3 different agents, GERD, anxiety on as needed clonazepam..  Patient is prescribed no antithrombotic medications.  Of note, patient had EGD in April 2021 that was normal.  Essential into the room, patient immediately shows me her notebook showing her blood pressure, pulse and notations multiple times a day for the past multiple days.  She reports concern for hypertension at home last night and this morning with blood pressures ranging 200-210/100-120.   Patient reports taking her BP medications as prescribed without recent changes.  She reports symptoms of anxiety and stress while noting these high blood pressures, otherwise she denies any symptoms during this.  Patient denies any chest pain, syncope, shortness of breath, headache or trauma.  Patient does report having a "slightly dark" stool this morning.  Patient denies any coexisting abdominal pain, nausea or emesis.  Here in the ED, patient ports feeling well and is without symptoms or complaints.  Past Medical History:  Diagnosis Date  . Anxiety disorder   . Dermatitis   . 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 Heart murmur   . Hypercholesterolemia   . Hypertension   . Lymphadenitis   . Renal disorder   . Skin cancer   . Spontaneous ecchymoses   . Vitamin D deficiency     Patient Active Problem List   Diagnosis Date Noted  . Melena   . Anemia, posthemorrhagic, acute   . Acute on chronic respiratory failure with hypoxia ([place]) 04/11/2020   . 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 Interstitial lung disease ([place]) 04/11/2020  . History of anemia due to chronic kidney disease 04/11/2020  . Hypertensive urgency 04/11/2020  . 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 Glomerulonephritis, acute   . Acute congestive heart failure ([place])   . AKI (acute kidney injury) ([place])   . Anemia of chronic disease   . Dyspnea 03/16/2020  . Hypokalemia 03/14/2016  . Hyponatremia 03/14/2016  . Aortic stenosis, mild 05/26/2015  . Abnormal EKG 04/14/2015  . Dyspnea on exertion 04/14/2015  . Accelerated essential hypertension 04/14/2015    Past Surgical History:  Procedure Laterality Date  . CARPAL TUNNEL RELEASE     [facility]  . ESOPHAGOGASTRODUODENOSCOPY (EGD) WITH PROPOFOL N/A 04/14/2020   Procedure: ESOPHAGOGASTRODUODENOSCOPY (EGD) WITH PROPOFOL;  Surgeon: Lucilla Lame, MD;  Location: [facility] ENDOSCOPY;  Service: Endoscopy;  Laterality: N/A;  . TONSILLECTOMY    . TRANSTHORACIC ECHOCARDIOGRAM  05/01/2015    EF 60-65%. Abnormal relaxation. Mild aortic stenosis (peak gradient 19 mmHg).    Prior to Admission medications   Medication Sig Start Date End Date Taking? Authorizing Provider  atenolol (TENORMIN) 25 MG tablet Take 1 tablet (25 mg total) by mouth daily. 03/29/20   Fritzi Mandes, MD  clonazePAM (KLONOPIN) 0.5 MG tablet Take 0.5 mg by mouth 3 (three) times daily as needed for anxiety.     [provider]  cloNIDine (CATAPRES) 0.1 MG tablet Take 1 tablet (0.1 mg total) by mouth 3 (three) times daily. Patient taking differently: Take 0.1 mg by mouth 2 (two) times  daily. Take and extra one if >160 if needed 05/01/17 03/16/20  Delman Kitten, MD  HYDROcodone-acetaminophen (NORCO/VICODIN) 5-325 MG tablet Take 1 tablet by mouth every 4 (four) hours as needed for moderate pain. 07/17/20   Cuthriell, Charline Bills, PA-C  iron polysaccharides (NIFEREX) 150 MG capsule Take 1 capsule (150 mg total) by mouth daily. 03/28/20   Fritzi Mandes, MD  losartan (COZAAR) 100 MG tablet Take 1 tablet (100 mg total) by mouth daily. 04/15/20   Lorella Nimrod, MD  methocarbamol (ROBAXIN) 500 MG tablet Take 1 tablet (500 mg total) by mouth 4 (four) times daily. 07/17/20   Cuthriell, Charline Bills, PA-C  Nutritional Supplements (FEEDING SUPPLEMENT, NEPRO CARB STEADY,) LIQD Take 237 mLs by mouth daily. 03/29/20   Fritzi Mandes, MD  pantoprazole (PROTONIX) 40 MG tablet Take 1 tablet (40 mg total) by mouth daily. 04/14/20 04/14/21  Lorella Nimrod, MD  predniSONE (DELTASONE) 10 MG tablet Take 1 tablet (10 mg total) by mouth daily. 07/17/20   Cuthriell, Charline Bills, PA-C  predniSONE (DELTASONE) 50 MG tablet Take 1 tablet (50 mg total) by mouth daily with breakfast. 04/14/20   Lorella Nimrod, MD  sulfamethoxazole-trimethoprim (BACTRIM) 400-80 MG tablet Take 1 tablet by mouth 3 (three) times a week. 04/14/20   Lorella Nimrod, MD  vitamin B-12 1000 MCG tablet Take 1 tablet (1,000 mcg total) by mouth daily. 03/28/20   Fritzi Mandes, MD    Allergies Amlodipine, Coreg [carvedilol], Statins, and Chlorthalidone  Family History  Problem Relation Age of Onset  . Heart disease Father   . Heart failure Father     Social History Social History   Tobacco Use  . Smoking status: Never Smoker  . Smokeless tobacco: Never Used  Vaping Use  . Vaping Use: Never used  Substance Use Topics  . Alcohol use: No  . Drug use: No    Review of Systems  Constitutional: No fever/chills Eyes: No visual changes. ENT: No sore throat. Cardiovascular: Denies chest pain.  Positive for hypertension. Respiratory: Denies shortness of breath. Gastrointestinal: No abdominal pain.  No nausea, no vomiting.  No diarrhea.  No constipation.  Positive for melena. Genitourinary: Negative for dysuria. Musculoskeletal: Negative for back pain. Skin: Negative for rash. Neurological: Negative for headaches, focal weakness or numbness.   ____________________________________________   PHYSICAL  EXAM:  VITAL SIGNS: Vitals:   08/15/20 1235 08/15/20 1443  BP: (!) 185/79 (!) 157/99  Pulse: 73 65  Resp: 18 18  Temp:    SpO2: 99% 95%      Constitutional: Alert and oriented. Well appearing and in no acute distress. Eyes: Conjunctivae are normal. PERRL. EOMI. Head: Atraumatic. Nose: No congestion/rhinnorhea. Mouth/Throat: Mucous membranes are moist.  Oropharynx non-erythematous. Neck: No stridor. No cervical spine tenderness to palpation. Cardiovascular: Normal rate, regular rhythm. Grossly normal heart sounds.  Good peripheral circulation. Respiratory: Normal respiratory effort.  No retractions. Lungs CTAB. Gastrointestinal: Soft , nondistended, nontender to palpation. No abdominal bruits. No CVA tenderness. Musculoskeletal: No lower extremity tenderness nor edema.  No joint effusions. No signs of acute trauma. Neurologic:  Normal speech and language. No gross focal neurologic deficits are appreciated. No gait instability noted. Skin:  Skin is warm, dry and intact. No rash noted. Psychiatric: Mood and affect are normal. Speech and behavior are normal.  ____________________________________________   LABS (all labs ordered are listed, but only abnormal results are displayed)  Labs Reviewed  CBC - Abnormal; Notable for the following components:      Result Value  WBC 11.9 (*)    All other components within normal limits  BASIC METABOLIC PANEL - Abnormal; Notable for the following components:   Chloride 95 (*)    Glucose, Bld 161 (*)    Creatinine, Ser 1.36 (*)    GFR calc non Af Amer 37 (*)    GFR calc Af Amer 43 (*)    All other components within normal limits   ____________________________________________  12 Lead EKG  Sinus rhythm, rate of 71 bpm, normal axis and intervals.  No evidence of acute ischemia. ____________________________________   PROCEDURES and INTERVENTIONS  Procedure(s) performed (including Critical Care):  Procedures  Medications - No  data to display  ____________________________________________   MDM / ED COURSE  79 year old woman presenting with asymptomatic hypertension and single episode of melena, amenable to outpatient management.  Patient persistently hypertensive here with systolics 295M-841L, otherwise normal vital signs on room air.  Exam demonstrates a well-appearing patient without evidence of distress, neurovascular deficits, abdominal tenderness or really any acute pathology.  Work-up is reassuring and without evidence of endorgan damage from her elevated blood pressure.  She has had no headache or neurovascular deficits to suggest intracranial hemorrhage.  Blood work demonstrates CKD at baseline and no drop in her hemoglobin.  EKG is nonischemic and patient has no chest pain.  Patient is well-established as an outpatient with her PCP, cardiologist and other specialists, and furthermore she has had a normal EGD in the past 4 months.  I urged the patient to follow-up with her various physicians and discuss repeat EGD with her PCP.  I urged her to be careful checking her BP too much at home as this can precipitate her anxiety, but to balance this with occasional checking a few times per week or up to once-twice per day.  We discussed return precautions for the ED.  Patient medically stable for discharge home.   ____________________________________________   FINAL CLINICAL IMPRESSION(S) / ED DIAGNOSES  Final diagnoses:  Essential hypertension  Anxiety  Melena     ED Discharge Orders    None       Aylen Rambert Christian   Note:  This document was prepared using Dragon voice recognition software and may include unintentional dictation errors.   Vladimir Crofts, MD 08/15/20 [phone]

## 2020-08-15 NOTE — Discharge Instructions (Signed)
Continue with normal care at home and please follow-up with Dr. Clemmie Krill and Dr. Ubaldo Glassing.  I would be careful checking her blood pressure at home too frequently, as this can worsen your underlying anxiety.  When you are in your normal routines at home, and feeling normal, I would recommend checking maybe once per day and documenting this number to bring to your PCP.  If you develop any headaches, chest pain, passing out, please return to the ED

## 2020-11-14 ENCOUNTER — Telehealth: Payer: Self-pay | Admitting: *Deleted

## 2020-11-14 NOTE — Telephone Encounter (Signed)
rn contacted Dr. Elwyn Lade office in [place] to obtain rituxan orders/dosing. Left vm on Dr. Elwyn Lade clinical nursing line. Asencion Partridge, RN in cancer center will follow-up in the morning on this request.

## 2020-11-17 ENCOUNTER — Emergency Department: Payer: Medicare HMO

## 2020-11-17 ENCOUNTER — Other Ambulatory Visit: Payer: Self-pay

## 2020-11-17 ENCOUNTER — Encounter: Payer: Self-pay | Admitting: Emergency Medicine

## 2020-11-17 ENCOUNTER — Inpatient Hospital Stay
Admission: EM | Admit: 2020-11-17 | Discharge: 2020-11-24 | DRG: 522 | Disposition: A | Discharge status: Skilled Nursing Facility | Payer: Medicare HMO | Attending: Internal Medicine | Admitting: Internal Medicine

## 2020-11-17 DIAGNOSIS — M5416 Radiculopathy, lumbar region: Secondary | ICD-10-CM | POA: Diagnosis present

## 2020-11-17 DIAGNOSIS — M80052A Age-related osteoporosis with current pathological fracture, left femur, initial encounter for fracture: Principal | ICD-10-CM | POA: Diagnosis present

## 2020-11-17 DIAGNOSIS — S72002A Fracture of unspecified part of neck of left femur, initial encounter for closed fracture: Secondary | ICD-10-CM

## 2020-11-17 DIAGNOSIS — Z20822 Contact with and (suspected) exposure to covid-19: Secondary | ICD-10-CM | POA: Diagnosis present

## 2020-11-17 DIAGNOSIS — E222 Syndrome of inappropriate secretion of antidiuretic hormone: Secondary | ICD-10-CM | POA: Diagnosis present

## 2020-11-17 DIAGNOSIS — E875 Hyperkalemia: Secondary | ICD-10-CM | POA: Diagnosis not present

## 2020-11-17 DIAGNOSIS — T380X5A Adverse effect of glucocorticoids and synthetic analogues, initial encounter: Secondary | ICD-10-CM | POA: Diagnosis present

## 2020-11-17 DIAGNOSIS — M545 Low back pain, unspecified: Secondary | ICD-10-CM

## 2020-11-17 DIAGNOSIS — Y92009 Unspecified place in unspecified non-institutional (private) residence as the place of occurrence of the external cause: Secondary | ICD-10-CM

## 2020-11-17 DIAGNOSIS — M5410 Radiculopathy, site unspecified: Secondary | ICD-10-CM

## 2020-11-17 DIAGNOSIS — G8929 Other chronic pain: Secondary | ICD-10-CM | POA: Diagnosis present

## 2020-11-17 DIAGNOSIS — N1832 Chronic kidney disease, stage 3b: Secondary | ICD-10-CM | POA: Diagnosis present

## 2020-11-17 DIAGNOSIS — I350 Nonrheumatic aortic (valve) stenosis: Secondary | ICD-10-CM | POA: Diagnosis present

## 2020-11-17 DIAGNOSIS — Z8249 Family history of ischemic heart disease and other diseases of the circulatory system: Secondary | ICD-10-CM

## 2020-11-17 DIAGNOSIS — Z7952 Long term (current) use of systemic steroids: Secondary | ICD-10-CM

## 2020-11-17 DIAGNOSIS — E7800 Pure hypercholesterolemia, unspecified: Secondary | ICD-10-CM | POA: Diagnosis present

## 2020-11-17 DIAGNOSIS — Z85828 Personal history of other malignant neoplasm of skin: Secondary | ICD-10-CM

## 2020-11-17 DIAGNOSIS — Z888 Allergy status to other drugs, medicaments and biological substances status: Secondary | ICD-10-CM

## 2020-11-17 DIAGNOSIS — Z96649 Presence of unspecified artificial hip joint: Secondary | ICD-10-CM

## 2020-11-17 DIAGNOSIS — R509 Fever, unspecified: Secondary | ICD-10-CM | POA: Diagnosis not present

## 2020-11-17 DIAGNOSIS — N059 Unspecified nephritic syndrome with unspecified morphologic changes: Secondary | ICD-10-CM | POA: Diagnosis present

## 2020-11-17 DIAGNOSIS — S72142A Displaced intertrochanteric fracture of left femur, initial encounter for closed fracture: Secondary | ICD-10-CM | POA: Diagnosis present

## 2020-11-17 DIAGNOSIS — Z79899 Other long term (current) drug therapy: Secondary | ICD-10-CM

## 2020-11-17 DIAGNOSIS — I129 Hypertensive chronic kidney disease with stage 1 through stage 4 chronic kidney disease, or unspecified chronic kidney disease: Secondary | ICD-10-CM | POA: Diagnosis present

## 2020-11-17 DIAGNOSIS — M792 Neuralgia and neuritis, unspecified: Secondary | ICD-10-CM

## 2020-11-17 DIAGNOSIS — F419 Anxiety disorder, unspecified: Secondary | ICD-10-CM | POA: Diagnosis present

## 2020-11-17 DIAGNOSIS — E871 Hypo-osmolality and hyponatremia: Secondary | ICD-10-CM

## 2020-11-17 DIAGNOSIS — M3130 Wegener's granulomatosis without renal involvement: Secondary | ICD-10-CM | POA: Diagnosis present

## 2020-11-17 DIAGNOSIS — I10 Essential (primary) hypertension: Secondary | ICD-10-CM

## 2020-11-17 DIAGNOSIS — D72829 Elevated white blood cell count, unspecified: Secondary | ICD-10-CM | POA: Diagnosis present

## 2020-11-17 DIAGNOSIS — S72141A Displaced intertrochanteric fracture of right femur, initial encounter for closed fracture: Secondary | ICD-10-CM

## 2020-11-17 DIAGNOSIS — S32592A Other specified fracture of left pubis, initial encounter for closed fracture: Secondary | ICD-10-CM

## 2020-11-17 DIAGNOSIS — S72002B Fracture of unspecified part of neck of left femur, initial encounter for open fracture type I or II: Secondary | ICD-10-CM

## 2020-11-17 DIAGNOSIS — M25552 Pain in left hip: Secondary | ICD-10-CM

## 2020-11-17 DIAGNOSIS — M5116 Intervertebral disc disorders with radiculopathy, lumbar region: Secondary | ICD-10-CM | POA: Diagnosis present

## 2020-11-17 DIAGNOSIS — R000 Tachycardia, unspecified: Secondary | ICD-10-CM | POA: Diagnosis not present

## 2020-11-17 DIAGNOSIS — Z8616 Personal history of COVID-19: Secondary | ICD-10-CM

## 2020-11-17 LAB — CBC WITH DIFFERENTIAL/PLATELET
Abs Immature Granulocytes: 0.54 10*3/uL — ABNORMAL HIGH (ref 0.00–0.07)
Basophils Absolute: 0.1 10*3/uL (ref 0.0–0.1)
Basophils Relative: 0 %
Eosinophils Absolute: 0 10*3/uL (ref 0.0–0.5)
Eosinophils Relative: 0 %
HCT: 44.3 % (ref 36.0–46.0)
Hemoglobin: 16 g/dL — ABNORMAL HIGH (ref 12.0–15.0)
Immature Granulocytes: 2 %
Lymphocytes Relative: 4 %
Lymphs Abs: 1.3 10*3/uL (ref 0.7–4.0)
MCH: 30.8 pg (ref 26.0–34.0)
MCHC: 36.1 g/dL — ABNORMAL HIGH (ref 30.0–36.0)
MCV: 85.4 fL (ref 80.0–100.0)
Monocytes Absolute: 3 10*3/uL — ABNORMAL HIGH (ref 0.1–1.0)
Monocytes Relative: 8 %
Neutro Abs: 31 10*3/uL — ABNORMAL HIGH (ref 1.7–7.7)
Neutrophils Relative %: 86 %
Platelets: 397 10*3/uL (ref 150–400)
RBC: 5.19 MIL/uL — ABNORMAL HIGH (ref 3.87–5.11)
RDW: 12.9 % (ref 11.5–15.5)
WBC: 36 10*3/uL — ABNORMAL HIGH (ref 4.0–10.5)
nRBC: 0 % (ref 0.0–0.2)

## 2020-11-17 LAB — RESP PANEL BY RT-PCR (FLU A&B, COVID) ARPGX2
Influenza A by PCR: NEGATIVE
Influenza B by PCR: NEGATIVE
SARS Coronavirus 2 by RT PCR: NEGATIVE

## 2020-11-17 LAB — BASIC METABOLIC PANEL
Anion gap: 14 (ref 5–15)
BUN: 32 mg/dL — ABNORMAL HIGH (ref 8–23)
CO2: 25 mmol/L (ref 22–32)
Calcium: 9.9 mg/dL (ref 8.9–10.3)
Chloride: 83 mmol/L — ABNORMAL LOW (ref 98–111)
Creatinine, Ser: 1.59 mg/dL — ABNORMAL HIGH (ref 0.44–1.00)
GFR, Estimated: 33 mL/min — ABNORMAL LOW (ref >60)
Glucose, Bld: 170 mg/dL — ABNORMAL HIGH (ref 70–99)
Potassium: 4.2 mmol/L (ref 3.5–5.1)
Sodium: 122 mmol/L — ABNORMAL LOW (ref 135–145)

## 2020-11-17 MED ORDER — SODIUM CHLORIDE 0.9 % IV BOLUS
1000.0000 mL | Freq: Once | INTRAVENOUS | Status: AC
  Administered 2020-11-17: 1000 mL via INTRAVENOUS

## 2020-11-17 MED ORDER — HYDROMORPHONE HCL 1 MG/ML IJ SOLN
1.0000 mg | Freq: Once | INTRAMUSCULAR | Status: AC
  Administered 2020-11-17: 1 mg via INTRAMUSCULAR
  Filled 2020-11-17: qty 1

## 2020-11-17 MED ORDER — ONDANSETRON 4 MG PO TBDP
4.0000 mg | ORAL_TABLET | Freq: Once | ORAL | Status: AC
  Administered 2020-11-17: 4 mg via ORAL
  Filled 2020-11-17: qty 1

## 2020-11-17 MED ORDER — CYCLOBENZAPRINE HCL 10 MG PO TABS
5.0000 mg | ORAL_TABLET | Freq: Once | ORAL | Status: AC
  Administered 2020-11-17: 5 mg via ORAL
  Filled 2020-11-17: qty 1

## 2020-11-17 MED ORDER — PREDNISONE 20 MG PO TABS
60.0000 mg | ORAL_TABLET | Freq: Once | ORAL | Status: AC
  Administered 2020-11-17: 60 mg via ORAL
  Filled 2020-11-17: qty 3

## 2020-11-17 NOTE — ED Notes (Signed)
On arrival into room, introduced self to pt and visitor. Visitor and pt kindly reminded to please keep masks in place while in ed. Visitor begins to state "fuck you, you come in here with that tone, how about you start over". And continues to cuss. Kindly explained to visitor and pt that I would like them to be protected even from staff while in ed. Visitor continues to cuss.

## 2020-11-17 NOTE — ED Provider Notes (Signed)
[facility] Emergency Department Provider Note ____________________________________________  Time seen: 1825  I have reviewed the triage vital signs and the nursing notes.  HISTORY  Chief Complaint  Back Pain   HPI Linda Bradley is a 78 y.o. female with a history of anxiety, hypertension, chronic kidney disease, presents to the ED via EMS from home.  She is accompanied by her adult son, for acute pain and disability.  Patient was of her normal health and [place] upon awakening this morning.  She began to experience increasing left-sided leg pain earlier this afternoon.  The pain became debilitating to the point where the patient was unable to stand and ambulate, even using a walker.  The patient has a history of  DDD, facet arthropathy, and foraminal stenosis.  She is currently under the care of a physiatrist, for management of her chronic low back pain.  She apparently had an MRI done in August of this year which detailed her underlying degenerative changes.  She is subsequently had a transforaminal ESI, performed and noted significant provement of her symptoms.  She was able to discontinue use of her previously prescribed oxycodone, following the procedure.  She is also recently completed a course of at home PT.  Patient had been ambulating without the use of her walker for short distance, as of late.  She presents today without any preceding injury, trauma, or fall.  She denies any recent illness, fever, chills, chest pain, or shortness of breath.  She also denies any bladder or bowel incontinence, foot drop, or saddle anesthesias.  She did give her report of some transient nausea without vomiting and diarrhea today.  She presents for evaluation of her left anterior thigh pain, and inability to bear weight secondary to pain.  Past Medical History:  Diagnosis Date  . Anxiety disorder   . Dermatitis   . 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 Heart murmur   .  Hypercholesterolemia   . Hypertension   . Lymphadenitis   . Renal disorder   . Skin cancer   . Spontaneous ecchymoses   . Vitamin D deficiency     Patient Active Problem List   Diagnosis Date Noted  . Melena   . Anemia, posthemorrhagic, acute   . Acute on chronic respiratory failure with hypoxia ([place]) 04/11/2020  . 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 Interstitial lung disease ([place]) 04/11/2020  . History of anemia due to chronic kidney disease 04/11/2020  . Hypertensive urgency 04/11/2020  . 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 Glomerulonephritis, acute   . Acute congestive heart failure ([place])   . AKI (acute kidney injury) ([place])   . Anemia of chronic disease   . Dyspnea 03/16/2020  . Hypokalemia 03/14/2016  . Hyponatremia 03/14/2016  . Aortic stenosis, mild 05/26/2015  . Abnormal EKG 04/14/2015  . Dyspnea on exertion 04/14/2015  . Accelerated essential hypertension 04/14/2015    Past Surgical History:  Procedure Laterality Date  . CARPAL TUNNEL RELEASE     [facility]  . ESOPHAGOGASTRODUODENOSCOPY (EGD) WITH PROPOFOL N/A 04/14/2020   Procedure: ESOPHAGOGASTRODUODENOSCOPY (EGD) WITH PROPOFOL;  Surgeon: Lucilla Lame, MD;  Location: [facility] ENDOSCOPY;  Service: Endoscopy;  Laterality: N/A;  . TONSILLECTOMY    . TRANSTHORACIC ECHOCARDIOGRAM  05/01/2015    EF 60-65%. Abnormal relaxation. Mild aortic stenosis (peak gradient 19 mmHg).    Prior to Admission medications   Medication Sig Start Date End Date Taking? Authorizing Provider  atenolol (TENORMIN) 25 MG tablet  Take 1 tablet (25 mg total) by mouth daily. 03/29/20   Fritzi Mandes, MD  clonazePAM (KLONOPIN) 0.5 MG tablet Take 0.5 mg by mouth 3 (three) times daily as needed for anxiety.     [provider]  cloNIDine (CATAPRES) 0.1 MG tablet Take 1 tablet (0.1 mg total) by mouth 3 (three) times daily. Patient taking differently: Take 0.1 mg by mouth 2 (two) times daily. Take and extra one if  >160 if needed 05/01/17 03/16/20  Delman Kitten, MD  HYDROcodone-acetaminophen (NORCO/VICODIN) 5-325 MG tablet Take 1 tablet by mouth every 4 (four) hours as needed for moderate pain. 07/17/20   Cuthriell, Charline Bills, PA-C  iron polysaccharides (NIFEREX) 150 MG capsule Take 1 capsule (150 mg total) by mouth daily. 03/28/20   Fritzi Mandes, MD  losartan (COZAAR) 100 MG tablet Take 1 tablet (100 mg total) by mouth daily. 04/15/20   Lorella Nimrod, MD  methocarbamol (ROBAXIN) 500 MG tablet Take 1 tablet (500 mg total) by mouth 4 (four) times daily. 07/17/20   Cuthriell, Charline Bills, PA-C  Nutritional Supplements (FEEDING SUPPLEMENT, NEPRO CARB STEADY,) LIQD Take 237 mLs by mouth daily. 03/29/20   Fritzi Mandes, MD  pantoprazole (PROTONIX) 40 MG tablet Take 1 tablet (40 mg total) by mouth daily. 04/14/20 04/14/21  Lorella Nimrod, MD  predniSONE (DELTASONE) 10 MG tablet Take 1 tablet (10 mg total) by mouth daily. 07/17/20   Cuthriell, Charline Bills, PA-C  predniSONE (DELTASONE) 50 MG tablet Take 1 tablet (50 mg total) by mouth daily with breakfast. 04/14/20   Lorella Nimrod, MD  sulfamethoxazole-trimethoprim (BACTRIM) 400-80 MG tablet Take 1 tablet by mouth 3 (three) times a week. 04/14/20   Lorella Nimrod, MD  vitamin B-12 1000 MCG tablet Take 1 tablet (1,000 mcg total) by mouth daily. 03/28/20   Fritzi Mandes, MD    Allergies Amlodipine, Coreg [carvedilol], Statins, and Chlorthalidone  Family History  Problem Relation Age of Onset  . Heart disease Father   . Heart failure Father     Social History Social History   Tobacco Use  . Smoking status: Never Smoker  . Smokeless tobacco: Never Used  Vaping Use  . Vaping Use: Never used  Substance Use Topics  . Alcohol use: No  . Drug use: No    Review of Systems  Constitutional: Negative for fever. Eyes: Negative for visual changes. ENT: Negative for sore throat. Cardiovascular: Negative for chest pain. Respiratory: Negative for shortness of breath. Gastrointestinal:  Negative for abdominal pain, vomiting and diarrhea. Genitourinary: Negative for dysuria. Musculoskeletal: Negative for back pain.  Reports right lower extremity radicular pain as above. Skin: Negative for rash. Neurological: Negative for headaches, focal weakness or numbness. ____________________________________________  PHYSICAL EXAM:  VITAL SIGNS: ED Triage Vitals  Enc Vitals Group     BP 11/17/20 1813 (!) 168/78     Pulse Rate 11/17/20 1813 98     Resp 11/17/20 1813 20     Temp 11/17/20 1813 98.9 F (37.2 C)     Temp Source 11/17/20 1813 Oral     SpO2 11/17/20 1813 96 %     Weight 11/17/20 1814 147 lb 0.8 oz (66.7 kg)     Height 11/17/20 1814 5' (1.524 m)     Head Circumference --      Peak Flow --      Pain Score 11/17/20 1814 9     Pain Loc --      Pain Edu? --      Excl. in [place]? --  Constitutional: Alert and oriented. Well appearing and in no distress. Head: Normocephalic and atraumatic. Eyes: Conjunctivae are normal. Normal extraocular movements Cardiovascular: Normal rate, regular rhythm. Normal distal pulses. Respiratory: Normal respiratory effort. No wheezes/rales/rhonchi. Gastrointestinal: Soft and nontender. No distention or rebound, guarding, or rigidity.. Musculoskeletal: Normal spinal alignment without midline tenderness, spasm, vomiting, or step-off.  Patient reports tenderness from the right groin along the dorsal lateral thigh terminating at the knee.  She is able demonstrate some limited hip flexion and knee flexion secondary to pain.  Nontender with normal range of motion in all other extremities.  Neurologic: Antalgic gait without ataxia.  Normal LE DTRs bilaterally.  Normal toe dorsiflexion foot eversion on exam.  Normal speech and language. No gross focal neurologic deficits are appreciated. Skin:  Skin is warm, dry and intact. No rash noted. Psychiatric: Mood and affect are normal. Patient exhibits appropriate insight and  judgment. ____________________________________________   LABORATORY  Labs Reviewed  CBC WITH DIFFERENTIAL/PLATELET - Abnormal; Notable for the following components:      Result Value   WBC 36.0 (*)    RBC 5.19 (*)    Hemoglobin 16.0 (*)    MCHC 36.1 (*)    Neutro Abs 31.0 (*)    Monocytes Absolute 3.0 (*)    Abs Immature Granulocytes 0.54 (*)    All other components within normal limits  BASIC METABOLIC PANEL - Abnormal; Notable for the following components:   Sodium 122 (*)    Chloride 83 (*)    Glucose, Bld 170 (*)    BUN 32 (*)    Creatinine, Ser 1.59 (*)    GFR, Estimated 33 (*)    All other components within normal limits  RESP PANEL BY RT-PCR (FLU A&B, COVID) ARPGX2  URINALYSIS, COMPLETE (UACMP) WITH MICROSCOPIC  ____________________________________________  PROCEDURES  Dilaudid 1 mg IM Zofran 4 mg ODT Prednisone 60 mg PO Cyclobenzaprine 5 mg PO NS 1000 bolus  Procedures  __________________________________________  RADIOLOGY  CT LUMBAR SPINE w/o CM  IMPRESSION: 1. Subacute bilateral sacral insufficiency fractures, relatively similar in appearance to previous MRI. 2. No other new acute abnormality within the lumbar spine. 3. 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 Appearance is relatively stable from previous. Please see above report for a full description of these findings. 4. Aortic Atherosclerosis (ICD10-I70.0). ____________________________________________  INITIAL IMPRESSION / ASSESSMENT AND PLAN / ED COURSE  ----------------------------------------- 10:40 PM on 11/17/2020 ----------------------------------------- Discussed the option of admission for pain control with the patient and her adult son. She is agreeable to admission due to her pain and disability at this time. Will order appropriate labs/imagaing and discuss with hospitalist.    ----------------------------------------- 12:37 AM on 11/18/2020 ----------------------------------------- S/W Dr. Prudy Feeler: she will evaluate and admit the patient for intractable pain.   Linda Bradley was evaluated in Emergency Department on 11/18/2020 for the symptoms described in the history of present illness. She was evaluated in the context of the global COVID-19 pandemic, which necessitated consideration that the patient might be at risk for infection with the SARS-CoV-2 virus that causes COVID-19. Institutional protocols and algorithms that pertain to the evaluation of patients at risk for COVID-19 are in a state of rapid change based on information released by regulatory bodies including the CDC and federal and state organizations. These policies and algorithms were followed during the patient's care in the ED.  I reviewed the patient's prescription history over the last 12 months in the multi-state  controlled substances database(s) that includes [place], [place], [place], [place], [place], [place], [place], [place], [place], [place], [place], [place], [place], and [place].  Results were notable for BZD prn RX.  ____________________________________________  FINAL CLINICAL IMPRESSION(S) / ED DIAGNOSES  Final diagnoses:  Acute left-sided low back pain without sciatica  Radicular leg pain  Acute hyponatremia  Intractable neuropathic pain of lower extremity, left      Kiko Ripp, Dannielle Karvonen, PA-C 11/18/20 0038    Arta Silence, MD 11/22/20 1226

## 2020-11-17 NOTE — ED Triage Notes (Signed)
See first RN Note; pt states hx of surgery for a pinched nerve. Pt A&O x4. Pt states the back pain has progressively worsened today. Pt states was ambulatory earlier today but now feels like she can't walk due to the pain.  Pt states approx 2 hrs ago took an oxycodone without relief.

## 2020-11-17 NOTE — ED Triage Notes (Signed)
Pt comes into ED via EMS from home with c/o left lower back pain radiating into the leg, hx of lumbar surgery. Here for pain control

## 2020-11-18 ENCOUNTER — Observation Stay: Payer: Medicare HMO | Admitting: Anesthesiology

## 2020-11-18 ENCOUNTER — Observation Stay: Payer: Medicare HMO

## 2020-11-18 ENCOUNTER — Encounter
Admission: EM | Disposition: A | Discharge status: Skilled Nursing Facility | Payer: Self-pay | Source: Home / Self Care | Attending: Family Medicine

## 2020-11-18 ENCOUNTER — Inpatient Hospital Stay: Payer: Medicare HMO

## 2020-11-18 DIAGNOSIS — M80052A Age-related osteoporosis with current pathological fracture, left femur, initial encounter for fracture: Secondary | ICD-10-CM | POA: Diagnosis present

## 2020-11-18 DIAGNOSIS — E871 Hypo-osmolality and hyponatremia: Secondary | ICD-10-CM | POA: Diagnosis present

## 2020-11-18 DIAGNOSIS — Z7952 Long term (current) use of systemic steroids: Secondary | ICD-10-CM

## 2020-11-18 DIAGNOSIS — I129 Hypertensive chronic kidney disease with stage 1 through stage 4 chronic kidney disease, or unspecified chronic kidney disease: Secondary | ICD-10-CM | POA: Diagnosis present

## 2020-11-18 DIAGNOSIS — N1832 Chronic kidney disease, stage 3b: Secondary | ICD-10-CM

## 2020-11-18 DIAGNOSIS — D72829 Elevated white blood cell count, unspecified: Secondary | ICD-10-CM

## 2020-11-18 DIAGNOSIS — M5416 Radiculopathy, lumbar region: Secondary | ICD-10-CM | POA: Diagnosis present

## 2020-11-18 DIAGNOSIS — E7800 Pure hypercholesterolemia, unspecified: Secondary | ICD-10-CM | POA: Diagnosis present

## 2020-11-18 DIAGNOSIS — N059 Unspecified nephritic syndrome with unspecified morphologic changes: Secondary | ICD-10-CM | POA: Diagnosis present

## 2020-11-18 DIAGNOSIS — R509 Fever, unspecified: Secondary | ICD-10-CM | POA: Diagnosis not present

## 2020-11-18 DIAGNOSIS — Y92009 Unspecified place in unspecified non-institutional (private) residence as the place of occurrence of the external cause: Secondary | ICD-10-CM | POA: Diagnosis not present

## 2020-11-18 DIAGNOSIS — S72141A Displaced intertrochanteric fracture of right femur, initial encounter for closed fracture: Secondary | ICD-10-CM

## 2020-11-18 DIAGNOSIS — Z85828 Personal history of other malignant neoplasm of skin: Secondary | ICD-10-CM | POA: Diagnosis not present

## 2020-11-18 DIAGNOSIS — Z20822 Contact with and (suspected) exposure to covid-19: Secondary | ICD-10-CM | POA: Diagnosis present

## 2020-11-18 DIAGNOSIS — Z8249 Family history of ischemic heart disease and other diseases of the circulatory system: Secondary | ICD-10-CM | POA: Diagnosis not present

## 2020-11-18 DIAGNOSIS — S72002A Fracture of unspecified part of neck of left femur, initial encounter for closed fracture: Secondary | ICD-10-CM | POA: Diagnosis not present

## 2020-11-18 DIAGNOSIS — M3130 Wegener's granulomatosis without renal involvement: Secondary | ICD-10-CM | POA: Diagnosis present

## 2020-11-18 DIAGNOSIS — E222 Syndrome of inappropriate secretion of antidiuretic hormone: Secondary | ICD-10-CM | POA: Diagnosis present

## 2020-11-18 DIAGNOSIS — F419 Anxiety disorder, unspecified: Secondary | ICD-10-CM | POA: Diagnosis present

## 2020-11-18 DIAGNOSIS — S32592A Other specified fracture of left pubis, initial encounter for closed fracture: Secondary | ICD-10-CM

## 2020-11-18 DIAGNOSIS — I10 Essential (primary) hypertension: Secondary | ICD-10-CM | POA: Diagnosis not present

## 2020-11-18 DIAGNOSIS — Z888 Allergy status to other drugs, medicaments and biological substances status: Secondary | ICD-10-CM | POA: Diagnosis not present

## 2020-11-18 DIAGNOSIS — T380X5A Adverse effect of glucocorticoids and synthetic analogues, initial encounter: Secondary | ICD-10-CM | POA: Diagnosis present

## 2020-11-18 DIAGNOSIS — M549 Dorsalgia, unspecified: Secondary | ICD-10-CM

## 2020-11-18 DIAGNOSIS — E875 Hyperkalemia: Secondary | ICD-10-CM | POA: Diagnosis not present

## 2020-11-18 DIAGNOSIS — R000 Tachycardia, unspecified: Secondary | ICD-10-CM | POA: Diagnosis not present

## 2020-11-18 DIAGNOSIS — Z79899 Other long term (current) drug therapy: Secondary | ICD-10-CM | POA: Diagnosis not present

## 2020-11-18 DIAGNOSIS — I350 Nonrheumatic aortic (valve) stenosis: Secondary | ICD-10-CM | POA: Diagnosis present

## 2020-11-18 DIAGNOSIS — G8929 Other chronic pain: Secondary | ICD-10-CM | POA: Diagnosis present

## 2020-11-18 DIAGNOSIS — Z8616 Personal history of COVID-19: Secondary | ICD-10-CM | POA: Diagnosis not present

## 2020-11-18 DIAGNOSIS — S72142A Displaced intertrochanteric fracture of left femur, initial encounter for closed fracture: Secondary | ICD-10-CM | POA: Diagnosis present

## 2020-11-18 DIAGNOSIS — M5116 Intervertebral disc disorders with radiculopathy, lumbar region: Secondary | ICD-10-CM | POA: Diagnosis present

## 2020-11-18 HISTORY — PX: HIP ARTHROPLASTY: SHX981

## 2020-11-18 LAB — CBC
HCT: 41.8 % (ref 36.0–46.0)
Hemoglobin: 15.4 g/dL — ABNORMAL HIGH (ref 12.0–15.0)
MCH: 31.1 pg (ref 26.0–34.0)
MCHC: 36.8 g/dL — ABNORMAL HIGH (ref 30.0–36.0)
MCV: 84.4 fL (ref 80.0–100.0)
Platelets: 328 10*3/uL (ref 150–400)
RBC: 4.95 MIL/uL (ref 3.87–5.11)
RDW: 13.1 % (ref 11.5–15.5)
WBC: 24.6 10*3/uL — ABNORMAL HIGH (ref 4.0–10.5)
nRBC: 0 % (ref 0.0–0.2)

## 2020-11-18 LAB — URINALYSIS, COMPLETE (UACMP) WITH MICROSCOPIC
Bilirubin Urine: NEGATIVE
Glucose, UA: 50 mg/dL — AB
Ketones, ur: NEGATIVE mg/dL
Leukocytes,Ua: NEGATIVE
Nitrite: NEGATIVE
Protein, ur: 100 mg/dL — AB
Specific Gravity, Urine: 1.011 (ref 1.005–1.030)
Squamous Epithelial / HPF: NONE SEEN (ref 0–5)
pH: 5 (ref 5.0–8.0)

## 2020-11-18 LAB — BASIC METABOLIC PANEL
Anion gap: 11 (ref 5–15)
BUN: 24 mg/dL — ABNORMAL HIGH (ref 8–23)
CO2: 22 mmol/L (ref 22–32)
Calcium: 9.3 mg/dL (ref 8.9–10.3)
Chloride: 88 mmol/L — ABNORMAL LOW (ref 98–111)
Creatinine, Ser: 1.2 mg/dL — ABNORMAL HIGH (ref 0.44–1.00)
GFR, Estimated: 46 mL/min — ABNORMAL LOW (ref >60)
Glucose, Bld: 168 mg/dL — ABNORMAL HIGH (ref 70–99)
Potassium: 5.1 mmol/L (ref 3.5–5.1)
Sodium: 121 mmol/L — ABNORMAL LOW (ref 135–145)

## 2020-11-18 LAB — TYPE AND SCREEN
ABO/RH(D): A POS
Antibody Screen: NEGATIVE

## 2020-11-18 LAB — SODIUM, URINE, RANDOM: Sodium, Ur: 67 mmol/L

## 2020-11-18 LAB — OSMOLALITY: Osmolality: 265 mOsm/kg — ABNORMAL LOW (ref 275–295)

## 2020-11-18 LAB — OSMOLALITY, URINE: Osmolality, Ur: 383 mOsm/kg (ref 300–900)

## 2020-11-18 SURGERY — HEMIARTHROPLASTY, HIP, DIRECT ANTERIOR APPROACH, FOR FRACTURE
Anesthesia: General | Site: Hip | Laterality: Left

## 2020-11-18 MED ORDER — MORPHINE SULFATE (PF) 2 MG/ML IV SOLN: 2.0000 mg | INTRAVENOUS | Status: DC | PRN

## 2020-11-18 MED ORDER — PHENYLEPHRINE HCL (PRESSORS) 10 MG/ML IV SOLN
INTRAVENOUS | Status: AC
  Filled 2020-11-18: qty 1

## 2020-11-18 MED ORDER — ACETAMINOPHEN 325 MG PO TABS: 650.0000 mg | ORAL_TABLET | Freq: Four times a day (QID) | ORAL | Status: DC | PRN

## 2020-11-18 MED ORDER — SODIUM CHLORIDE 0.9 % IV SOLN: INTRAVENOUS | Status: DC

## 2020-11-18 MED ORDER — ACETAMINOPHEN 10 MG/ML IV SOLN: 1000.0000 mg | Freq: Once | INTRAVENOUS | Status: DC | PRN

## 2020-11-18 MED ORDER — MORPHINE SULFATE (PF) 2 MG/ML IV SOLN
0.5000 mg | INTRAVENOUS | Status: DC | PRN
  Administered 2020-11-18: 0.5 mg via INTRAVENOUS
  Filled 2020-11-18: qty 1

## 2020-11-18 MED ORDER — ONDANSETRON HCL 4 MG/2ML IJ SOLN
INTRAMUSCULAR | Status: DC | PRN
  Administered 2020-11-18: 4 mg via INTRAVENOUS

## 2020-11-18 MED ORDER — TRANEXAMIC ACID 1000 MG/10ML IV SOLN
INTRAVENOUS | Status: DC | PRN
  Administered 2020-11-18: 1000 mg via TOPICAL

## 2020-11-18 MED ORDER — OXYCODONE HCL 5 MG PO TABS: 5.0000 mg | ORAL_TABLET | Freq: Once | ORAL | Status: DC | PRN

## 2020-11-18 MED ORDER — TRANEXAMIC ACID 1000 MG/10ML IV SOLN
INTRAVENOUS | Status: AC
  Filled 2020-11-18: qty 10

## 2020-11-18 MED ORDER — ONDANSETRON HCL 4 MG/2ML IJ SOLN
INTRAMUSCULAR | Status: AC
  Filled 2020-11-18: qty 2

## 2020-11-18 MED ORDER — CEFAZOLIN SODIUM-DEXTROSE 2-4 GM/100ML-% IV SOLN
2.0000 g | Freq: Four times a day (QID) | INTRAVENOUS | Status: AC
  Administered 2020-11-18 – 2020-11-19 (×2): 2 g via INTRAVENOUS
  Filled 2020-11-18 (×2): qty 100

## 2020-11-18 MED ORDER — PREDNISONE 10 MG (21) PO TBPK: 10.0000 mg | ORAL_TABLET | ORAL | Status: DC

## 2020-11-18 MED ORDER — CEFAZOLIN SODIUM-DEXTROSE 2-4 GM/100ML-% IV SOLN
INTRAVENOUS | Status: AC
  Filled 2020-11-18: qty 100

## 2020-11-18 MED ORDER — SUGAMMADEX SODIUM 200 MG/2ML IV SOLN
INTRAVENOUS | Status: DC | PRN
  Administered 2020-11-18: 200 mg via INTRAVENOUS

## 2020-11-18 MED ORDER — FENTANYL CITRATE (PF) 100 MCG/2ML IJ SOLN
INTRAMUSCULAR | Status: DC | PRN
  Administered 2020-11-18 (×2): 50 ug via INTRAVENOUS
  Administered 2020-11-18: 100 ug via INTRAVENOUS

## 2020-11-18 MED ORDER — PREDNISONE 10 MG (21) PO TBPK: 20.0000 mg | ORAL_TABLET | Freq: Every morning | ORAL | Status: DC

## 2020-11-18 MED ORDER — SODIUM CHLORIDE FLUSH 0.9 % IV SOLN
INTRAVENOUS | Status: AC
  Filled 2020-11-18: qty 40

## 2020-11-18 MED ORDER — HYDROMORPHONE HCL 1 MG/ML IJ SOLN
INTRAMUSCULAR | Status: AC
  Filled 2020-11-18: qty 1

## 2020-11-18 MED ORDER — DEXAMETHASONE SODIUM PHOSPHATE 10 MG/ML IJ SOLN
INTRAMUSCULAR | Status: AC
  Filled 2020-11-18: qty 1

## 2020-11-18 MED ORDER — LACTATED RINGERS IV SOLN: INTRAVENOUS | Status: DC | PRN

## 2020-11-18 MED ORDER — OXYCODONE HCL 5 MG/5ML PO SOLN: 5.0000 mg | Freq: Once | ORAL | Status: DC | PRN

## 2020-11-18 MED ORDER — CEFAZOLIN SODIUM-DEXTROSE 2-4 GM/100ML-% IV SOLN
INTRAVENOUS | Status: AC
  Administered 2020-11-18: 2 g via INTRAVENOUS
  Filled 2020-11-18: qty 100

## 2020-11-18 MED ORDER — PREDNISONE 10 MG (21) PO TBPK: 10.0000 mg | ORAL_TABLET | Freq: Four times a day (QID) | ORAL | Status: DC

## 2020-11-18 MED ORDER — DEXAMETHASONE SODIUM PHOSPHATE 10 MG/ML IJ SOLN
INTRAMUSCULAR | Status: DC | PRN
  Administered 2020-11-18: 10 mg via INTRAVENOUS

## 2020-11-18 MED ORDER — HYDROMORPHONE HCL 1 MG/ML IJ SOLN
INTRAMUSCULAR | Status: DC | PRN
Start: 2020-11-18 — End: 2020-11-18
  Administered 2020-11-18: .5 mg via INTRAVENOUS

## 2020-11-18 MED ORDER — HYDROCODONE-ACETAMINOPHEN 5-325 MG PO TABS
1.0000 | ORAL_TABLET | Freq: Four times a day (QID) | ORAL | Status: DC | PRN
  Administered 2020-11-20: 1 via ORAL
  Filled 2020-11-18 (×2): qty 1

## 2020-11-18 MED ORDER — LIDOCAINE HCL (PF) 2 % IJ SOLN
INTRAMUSCULAR | Status: AC
  Filled 2020-11-18: qty 5

## 2020-11-18 MED ORDER — FENTANYL CITRATE (PF) 100 MCG/2ML IJ SOLN: 25.0000 ug | INTRAMUSCULAR | Status: DC | PRN

## 2020-11-18 MED ORDER — CLONAZEPAM 0.5 MG PO TABS
0.5000 mg | ORAL_TABLET | Freq: Three times a day (TID) | ORAL | Status: DC | PRN
  Administered 2020-11-20 – 2020-11-23 (×4): 0.5 mg via ORAL
  Filled 2020-11-18 (×5): qty 1

## 2020-11-18 MED ORDER — EPHEDRINE SULFATE 50 MG/ML IJ SOLN
INTRAMUSCULAR | Status: DC | PRN
  Administered 2020-11-18 (×2): 10 mg via INTRAVENOUS

## 2020-11-18 MED ORDER — PREDNISONE 10 MG (21) PO TBPK: 20.0000 mg | ORAL_TABLET | Freq: Every evening | ORAL | Status: DC

## 2020-11-18 MED ORDER — BUPIVACAINE LIPOSOME 1.3 % IJ SUSP
INTRAMUSCULAR | Status: AC
  Filled 2020-11-18: qty 20

## 2020-11-18 MED ORDER — CEFAZOLIN SODIUM-DEXTROSE 2-4 GM/100ML-% IV SOLN
2.0000 g | INTRAVENOUS | Status: AC
  Administered 2020-11-18: 2 g via INTRAVENOUS

## 2020-11-18 MED ORDER — PHENYLEPHRINE HCL (PRESSORS) 10 MG/ML IV SOLN
INTRAVENOUS | Status: DC | PRN
  Administered 2020-11-18: 100 ug via INTRAVENOUS

## 2020-11-18 MED ORDER — ACETAMINOPHEN 10 MG/ML IV SOLN
INTRAVENOUS | Status: AC
  Administered 2020-11-18: 1000 mg via INTRAVENOUS
  Filled 2020-11-18: qty 100

## 2020-11-18 MED ORDER — BUPIVACAINE LIPOSOME 1.3 % IJ SUSP
INTRAMUSCULAR | Status: DC | PRN
  Administered 2020-11-18: 20 mL

## 2020-11-18 MED ORDER — PROPOFOL 500 MG/50ML IV EMUL
INTRAVENOUS | Status: AC
  Filled 2020-11-18: qty 50

## 2020-11-18 MED ORDER — FENTANYL CITRATE (PF) 100 MCG/2ML IJ SOLN
INTRAMUSCULAR | Status: AC
  Filled 2020-11-18: qty 2

## 2020-11-18 MED ORDER — PREDNISONE 10 MG (21) PO TBPK: 10.0000 mg | ORAL_TABLET | Freq: Three times a day (TID) | ORAL | Status: DC

## 2020-11-18 MED ORDER — LIDOCAINE HCL (CARDIAC) PF 100 MG/5ML IV SOSY
PREFILLED_SYRINGE | INTRAVENOUS | Status: DC | PRN
  Administered 2020-11-18: 80 mg via INTRAVENOUS

## 2020-11-18 MED ORDER — FENTANYL CITRATE (PF) 250 MCG/5ML IJ SOLN
INTRAMUSCULAR | Status: AC
  Filled 2020-11-18: qty 5

## 2020-11-18 MED ORDER — SODIUM CHLORIDE 0.9 % IV SOLN
INTRAVENOUS | Status: DC | PRN
  Administered 2020-11-18: 50 ug/min via INTRAVENOUS

## 2020-11-18 MED ORDER — HYDROCODONE-ACETAMINOPHEN 5-325 MG PO TABS: 1.0000 | ORAL_TABLET | ORAL | Status: DC | PRN

## 2020-11-18 MED ORDER — ONDANSETRON HCL 4 MG/2ML IJ SOLN: 4.0000 mg | Freq: Once | INTRAMUSCULAR | Status: DC | PRN

## 2020-11-18 MED ORDER — BUPIVACAINE-EPINEPHRINE (PF) 0.5% -1:200000 IJ SOLN
INTRAMUSCULAR | Status: DC | PRN
  Administered 2020-11-18: 30 mL via PERINEURAL

## 2020-11-18 MED ORDER — ACETAMINOPHEN 650 MG RE SUPP: 650.0000 mg | Freq: Four times a day (QID) | RECTAL | Status: DC | PRN

## 2020-11-18 MED ORDER — METHOCARBAMOL 500 MG PO TABS
500.0000 mg | ORAL_TABLET | Freq: Four times a day (QID) | ORAL | Status: DC | PRN
  Filled 2020-11-18: qty 1

## 2020-11-18 MED ORDER — ONDANSETRON HCL 4 MG/2ML IJ SOLN: 4.0000 mg | Freq: Four times a day (QID) | INTRAMUSCULAR | Status: DC | PRN

## 2020-11-18 MED ORDER — ENOXAPARIN SODIUM 40 MG/0.4ML ~~LOC~~ SOLN: 40.0000 mg | SUBCUTANEOUS | Status: DC

## 2020-11-18 MED ORDER — ROCURONIUM BROMIDE 100 MG/10ML IV SOLN
INTRAVENOUS | Status: DC | PRN
  Administered 2020-11-18: 50 mg via INTRAVENOUS

## 2020-11-18 MED ORDER — PROPOFOL 10 MG/ML IV BOLUS
INTRAVENOUS | Status: DC | PRN
  Administered 2020-11-18: 50 mg via INTRAVENOUS

## 2020-11-18 MED ORDER — ONDANSETRON HCL 4 MG PO TABS: 4.0000 mg | ORAL_TABLET | Freq: Four times a day (QID) | ORAL | Status: DC | PRN

## 2020-11-18 MED ORDER — ROCURONIUM BROMIDE 10 MG/ML (PF) SYRINGE
PREFILLED_SYRINGE | INTRAVENOUS | Status: AC
  Filled 2020-11-18: qty 10

## 2020-11-18 MED ORDER — BUPIVACAINE-EPINEPHRINE (PF) 0.5% -1:200000 IJ SOLN
INTRAMUSCULAR | Status: AC
  Filled 2020-11-18: qty 30

## 2020-11-18 SURGICAL SUPPLY — 69 items
BAG DECANTER FOR FLEXI CONT (MISCELLANEOUS) IMPLANT
BLADE SAGITTAL WIDE XTHICK NO (BLADE) ×3 IMPLANT
BLADE SURG SZ20 CARB STEEL (BLADE) ×3 IMPLANT
BNDG COHESIVE 6X5 TAN STRL LF (GAUZE/BANDAGES/DRESSINGS) ×3 IMPLANT
BOWL CEMENT MIXING ADV NOZZLE (MISCELLANEOUS) IMPLANT
CANISTER SUCT 1200ML W/VALVE (MISCELLANEOUS) IMPLANT
CANISTER SUCT 3000ML PPV (MISCELLANEOUS) ×3 IMPLANT
CHLORAPREP W/TINT 26 (MISCELLANEOUS) ×6 IMPLANT
COVER WAND RF STERILE (DRAPES) IMPLANT
DECANTER SPIKE VIAL GLASS SM (MISCELLANEOUS) IMPLANT
DRAPE 3/4 80X56 (DRAPES) ×3 IMPLANT
DRAPE IMP U-DRAPE 54X76 (DRAPES) ×3 IMPLANT
DRAPE INCISE IOBAN 66X60 STRL (DRAPES) ×3 IMPLANT
DRAPE SPLIT 6X30 W/TAPE (DRAPES) ×6 IMPLANT
DRAPE SURG 17X11 SM STRL (DRAPES) ×3 IMPLANT
DRAPE SURG 17X23 STRL (DRAPES) ×3 IMPLANT
DRSG OPSITE POSTOP 4X12 (GAUZE/BANDAGES/DRESSINGS) IMPLANT
DRSG OPSITE POSTOP 4X14 (GAUZE/BANDAGES/DRESSINGS) IMPLANT
DRSG OPSITE POSTOP 4X8 (GAUZE/BANDAGES/DRESSINGS) ×3 IMPLANT
ELECT BLADE 6.5 EXT (BLADE) ×3 IMPLANT
ELECT CAUTERY BLADE 6.4 (BLADE) ×3 IMPLANT
ELECT REM PT RETURN 9FT ADLT (ELECTROSURGICAL) ×3
ELECTRODE REM PT RTRN 9FT ADLT (ELECTROSURGICAL) ×1 IMPLANT
GAUZE PACK 2X3YD (PACKING) IMPLANT
GAUZE XEROFORM 1X8 LF (GAUZE/BANDAGES/DRESSINGS) ×3 IMPLANT
GLOVE BIO SURGEON STRL SZ8 (GLOVE) ×9 IMPLANT
GLOVE BIOGEL M STRL SZ7.5 (GLOVE) IMPLANT
GLOVE BIOGEL PI IND STRL 8 (GLOVE) ×1 IMPLANT
GLOVE BIOGEL PI INDICATOR 8 (GLOVE) ×2
GLOVE INDICATOR 8.0 STRL GRN (GLOVE) ×3 IMPLANT
GOWN STRL REUS W/ TWL LRG LVL3 (GOWN DISPOSABLE) ×1 IMPLANT
GOWN STRL REUS W/ TWL XL LVL3 (GOWN DISPOSABLE) ×1 IMPLANT
GOWN STRL REUS W/TWL LRG LVL3 (GOWN DISPOSABLE) ×2
GOWN STRL REUS W/TWL XL LVL3 (GOWN DISPOSABLE) ×2
HANDLE YANKAUER SUCT BULB TIP (MISCELLANEOUS) ×3 IMPLANT
HEAD ENDO II MOD SZ 41 (Orthopedic Implant) ×3 IMPLANT
HOOD PEEL AWAY FLYTE STAYCOOL (MISCELLANEOUS) ×6 IMPLANT
INSERT TAPER ENDO II -6 (Orthopedic Implant) ×3 IMPLANT
IV NS 100ML SINGLE PACK (IV SOLUTION) IMPLANT
LABEL OR SOLS (LABEL) ×3 IMPLANT
MANIFOLD NEPTUNE II (INSTRUMENTS) ×3 IMPLANT
NDL SAFETY ECLIPSE 18X1.5 (NEEDLE) ×1 IMPLANT
NEEDLE FILTER BLUNT 18X 1/2SAF (NEEDLE)
NEEDLE FILTER BLUNT 18X1 1/2 (NEEDLE) IMPLANT
NEEDLE HYPO 18GX1.5 SHARP (NEEDLE) ×2
NEEDLE SPNL 20GX3.5 QUINCKE YW (NEEDLE) ×3 IMPLANT
NS IRRIG 1000ML POUR BTL (IV SOLUTION) IMPLANT
PACK HIP PROSTHESIS (MISCELLANEOUS) ×3 IMPLANT
PILLOW ABDUCTION FOAM SM (MISCELLANEOUS) ×3 IMPLANT
PULSAVAC PLUS IRRIG FAN TIP (DISPOSABLE) ×3
SOL .9 NS 3000ML IRR  AL (IV SOLUTION) ×2
SOL .9 NS 3000ML IRR UROMATIC (IV SOLUTION) ×1 IMPLANT
STAPLER SKIN PROX 35W (STAPLE) ×3 IMPLANT
STEM COLLARLESS RED 11X135MM (Stem) ×3 IMPLANT
STRAP SAFETY 5IN WIDE (MISCELLANEOUS) ×3 IMPLANT
SUT ETHIBOND 2 V 37 (SUTURE) ×3 IMPLANT
SUT VIC AB 1 CT1 36 (SUTURE) IMPLANT
SUT VIC AB 2-0 CT1 (SUTURE) ×12 IMPLANT
SUT VIC AB 2-0 CT1 27 (SUTURE)
SUT VIC AB 2-0 CT1 TAPERPNT 27 (SUTURE) IMPLANT
SUT VICRYL 1-0 27IN ABS (SUTURE) ×6
SUTURE VICRYL 1-0 27IN ABS (SUTURE) ×2 IMPLANT
SYR 10ML LL (SYRINGE) ×3 IMPLANT
SYR 30ML LL (SYRINGE) ×9 IMPLANT
SYR TB 1ML 27GX1/2 LL (SYRINGE) IMPLANT
TAPE TRANSPORE STRL 2 31045 (GAUZE/BANDAGES/DRESSINGS) ×3 IMPLANT
TIP BRUSH PULSAVAC PLUS 24.33 (MISCELLANEOUS) IMPLANT
TIP FAN IRRIG PULSAVAC PLUS (DISPOSABLE) ×1 IMPLANT
WATER STERILE IRR 1000ML POUR (IV SOLUTION) ×6 IMPLANT

## 2020-11-18 NOTE — ED Notes (Signed)
Pt undressed for OR. Hospitalist at bedside assessing pt.

## 2020-11-18 NOTE — Anesthesia Procedure Notes (Signed)
Procedure Name: Intubation Date/Time: 11/18/2020 9:38 AM Performed by: Orion Crook, CRNA Pre-anesthesia Checklist: Patient identified, Emergency Drugs available, Suction available, Patient being monitored and Timeout performed Patient Re-evaluated:Patient Re-evaluated prior to induction Oxygen Delivery Method: Circle system utilized Preoxygenation: Pre-oxygenation with 100% oxygen Induction Type: IV induction Ventilation: Mask ventilation without difficulty Laryngoscope Size: Mac and 3 Grade View: Grade I Tube type: Oral Tube size: 7.0 mm Number of attempts: 1 Placement Confirmation: ETT inserted through vocal cords under direct vision,  positive ETCO2 and breath sounds checked- equal and bilateral Secured at: 21 cm

## 2020-11-18 NOTE — ED Notes (Signed)
OR nurse called. Gave report to OR nurse. She stated that surgeon will come talk to pt soon and that surgery is scheduled for 0900.

## 2020-11-18 NOTE — Anesthesia Preprocedure Evaluation (Signed)
Anesthesia Evaluation  Patient identified by MRN, date of birth, ID band Patient awake  General Assessment Comment:Presented with hip fracture after fall. Hyponatremic to 121, thought to be hypovolemic hyponatremia, No mental status changes.  Reviewed: Allergy & Precautions, NPO status , Patient's Chart, lab work & pertinent test results  History of Anesthesia Complications Negative for: history of anesthetic complications  Airway Mallampati: II  TM Distance: >3 FB Neck ROM: Full    Dental  (+) Implants, Caps, Teeth Intact   Pulmonary shortness of breath, neg sleep apnea, neg COPD,  Was on home O2 after covid, weaned off a few weeks ago   Pulmonary exam normal breath sounds clear to auscultation- rhonchi (-) wheezing      Cardiovascular hypertension, Pt. on medications +CHF (preserved EF)  (-) CAD, (-) Past MI, (-) Cardiac Stents and (-) CABG + Valvular Problems/Murmurs (mod/severe AS) AS  Rhythm:Regular Rate:Normal - Systolic murmurs and - Diastolic murmurs Echo 05/28/29: 1. Left ventricular ejection fraction, by estimation, is 55 to 60%. The  left ventricle has normal function. The left ventricle has no regional  wall motion abnormalities. Left ventricular diastolic parameters are  consistent with Grade I diastolic  dysfunction (impaired relaxation).  2. Right ventricular systolic function is normal. The right ventricular  size is normal.  3. The mitral valve is normal in structure. Mild mitral valve  regurgitation. No evidence of mitral stenosis.  4. The aortic valve is normal in structure. Aortic valve regurgitation is  mild to moderate. 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  5. The inferior vena cava is normal in size with greater than 50%  respiratory variability, suggesting right atrial pressure of 3 mmHg.    Neuro/Psych neg Seizures PSYCHIATRIC DISORDERS Anxiety negative neurological ROS      GI/Hepatic negative GI ROS, Neg liver ROS,   Endo/Other  negative endocrine ROS  Renal/GU CRFRenal disease     Musculoskeletal negative musculoskeletal ROS (+)   Abdominal (+) - obese,   Peds  Hematology  (+) anemia ,   Anesthesia Other Findings Past Medical History: No date: Anxiety disorder No date: Dermatitis 03/28/2020: Granulomatosis with polyangiitis (HCC) No date: Heart murmur No date: Hypercholesterolemia No date: Hypertension No date: Lymphadenitis No date: Skin cancer No date: Spontaneous ecchymoses No date: Vitamin D deficiency   Reproductive/Obstetrics                             Anesthesia Physical  Anesthesia Plan  ASA: III  Anesthesia Plan: General   Post-op Pain Management:    Induction: Intravenous  PONV Risk Score and Plan: 3 and Ondansetron, Dexamethasone and Treatment may vary due to age or medical condition  Airway Management Planned: Oral ETT  Additional Equipment: None  Intra-op Plan:   Post-operative Plan: Extubation in OR  Informed Consent: I have reviewed the patients History and Physical, chart, labs and discussed the procedure including the risks, benefits and alternatives for the proposed anesthesia with the patient or authorized representative who has indicated his/her understanding and acceptance.     Dental advisory given  Plan Discussed with: CRNA and Anesthesiologist  Anesthesia Plan Comments: (  Discussed risks of anesthesia with patient, including PONV, sore throat, lip/dental damage. Rare risks discussed as well, such as cardiorespiratory and neurological sequelae. Patient understands.)        Anesthesia Quick Evaluation

## 2020-11-18 NOTE — Transfer of Care (Signed)
Immediate Anesthesia Transfer of Care Note  Patient: Linda Bradley  Procedure(s) Performed: ARTHROPLASTY BIPOLAR HIP (HEMIARTHROPLASTY) (Left Hip)  Patient Location: PACU  Anesthesia Type:General  Level of Consciousness: awake  Airway & Oxygen Therapy: Patient Spontanous Breathing and Patient connected to nasal cannula oxygen  Post-op Assessment: Report given to RN and Post -op Vital signs reviewed and stable  Post vital signs: Reviewed  Last Vitals:  Vitals Value Taken Time  BP 149/90 11/18/20 1128  Temp    Pulse 113 11/18/20 1134  Resp 20 11/18/20 1134  SpO2 100 % 11/18/20 1134  Vitals shown include unvalidated device data.  Last Pain:  Vitals:   11/18/20 0743  TempSrc:   PainSc: Asleep      Patients Stated Pain Goal: 3 (18/48/59 2763)  Complications: No complications documented.

## 2020-11-18 NOTE — Progress Notes (Signed)
PROGRESS NOTE    ADASIA HOAR  PJA:[id number] DOB: 06/14/1941 DOA: 11/17/2020 PCP: Lynnell Jude, MD   Brief Narrative:  ROSALEEN Bradley is a 79 y.o. 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   She denied falling or making any sudden movement that may have precipitated the pain. States prior to the onset of the pain she was in her usual state of health and felt like she was doing fairly well.  She denies recent illness.  Patient had been on oxygen since she got Covid in January but was gradually weaned off and turned in her oxygen tank a few weeks prior.  She denies chest pain, shortness of breath, palpitations, fever or chills.  Denies dysuria, change in bowel habits, no vomiting.  Has nausea but this has been the same for several months.  Denies headache, visual disturbance, weakness in face or extremities. ED Course: On arrival she was afebrile, BP 168/78 heart rate 98 O2 sat 96% on room air.  She had a CT lumbar spine that showed subacute bilateral sacral insufficiency fractures similar in appearance to previous MRI with no other acute abnormality.  She does have moderate to advanced multilevel degenerative spondylosis and facet arthrosis relatively stable from prior CTs.  Blood work, significant for leukocytosis of 36,000 with hemoglobin 16.  Sodium 122, BUN/creatinine of 32/1.59.  Creatinine was 1.36 three months prior.  Urinalysis showed proteinuria large hemoglobin  and rare bacteria.  Covid and flu test negative.  Patient was treated with prednisone and narcotics in the emergency room without significant improvement.  Hospitalist consulted for admission. EKG: Sinus tachycardia at 100 with no acute ST-T wave changes  Following admission, hip x-ray was done showing " transverse subcapital fracture of the left femoral neck with varus angulation with fractures of the left superior and inferior pubic rami possibly demonstrating healing"  Assessment & Plan:   Principal Problem:   Closed left hip fracture, initial encounter [facility] Active Problems:   Essential hypertension   Hyponatremia   Pauci-immune Glomerulonephritis   On long term prednisone therapy   Stage 3b chronic kidney disease (HCC)   Chronic back pain   Leukocytosis   Closed fracture of multiple pubic rami, left, initial encounter ([place])   Closed left hip fracture, suspect pathologic fracture/Left superior and inferior pubic rami fractures: No fall involved.  Likely pathological fracture most likely due to being on steroids chronically.  Orthopedic on board.  She is down for surgery now.  Management deferred to orthopedics.  Acute hyponatremia -Sodium 122 and is stable.  Patient completely asymptomatic.  Will order serum osmolarity.  Monitor daily.  History of Covid: -Patient had Covid in January 2021 has been weaned off of chronic oxygen within the past month -Saturating in the high 90s on room air  History of lumbar radiculopathy requiring epidural steroid injection Chronic DDD -CT LS spine showing stable findings from prior imaging including insufficiency fractures, moderate to advanced DDD and facet arthropathy   Leukocytosis: Patient does seem to have chronic  leukocytosis going on the low back from May 2018.  However, numbers were not that high. -WBC of 36,000 without stigmata of infection upon presentation.  Leukocytosis improving.  Rare to see such high amount of white blood  cells just because of low-dose steroids.    Essential hypertension: Controlled. -Continue home amlodipine and atenolol    Pauci-immune Glomerulonephritis, April 2021   On long term prednisone therapy   Stage 3b chronic kidney disease ([place]) -Patient followed by nephrology, last seen 11/13/2020 -Continue prednisone 10 mg daily   DVT prophylaxis: SCDs Start: 11/18/20 1114 Place TED hose Start: 11/18/20 1114 SCDs Start: 11/18/20 0345 Place and maintain sequential compression device Start: 11/18/20 0304   Code Status: Full Code  Family Communication:  None present at bedside.  Plan of care discussed with patient in length and he verbalized understanding and agreed with it.  Status is: Observation  The patient will require care spanning > 2 midnights and should be moved to inpatient because: Inpatient level of care appropriate due to severity of illness  Dispo: The patient is from: Home              Anticipated d/c is to: SNF              Anticipated d/c date is: 3 days              Patient currently is not medically stable to d/c.        Estimated body mass index is 28.72 kg/m as calculated from the following:   Height as of this encounter: 5' (1.524 m).   Weight as of this encounter: 66.7 kg.      Nutritional status:               Consultants:   Orthopedics  Procedures:   None  Antimicrobials:  Anti-infectives (From admission, onward)   Start     Dose/Rate Route Frequency Ordered Stop   11/18/20 0910  ceFAZolin (ANCEF) 2-4 GM/100ML-% IVPB       Note to Pharmacy: Register, Karen   : cabinet override      11/18/20 0910 11/18/20 0945   11/18/20 0312  [MAR Hold]  ceFAZolin (ANCEF) IVPB 2g/100 mL premix        (MAR Hold since Sat 11/18/2020 at 0924.Hold Reason: Transfer to a Procedural area.)   2 g 200 mL/hr over 30 Minutes Intravenous 30 min pre-op 11/18/20 0314 11/18/20 0944         Subjective: Patient seen and examined in the ED.  She still  complains of left hip pain.  No other complaint.  Objective: Vitals:   11/18/20 0645 11/18/20 0700 11/18/20 0715 11/18/20 0800  BP:  (!) 149/77  (!) 149/80  Pulse: (!) 109 (!) 111    Resp: 15 14 18 14   Temp:      TempSrc:      SpO2: 94% 94%    Weight:      Height:        Intake/Output Summary (Last 24 hours) at 11/18/2020 1114 Last data filed at 11/18/2020 0956 Gross per 24 hour  Intake 1000 ml  Output 1500 ml  Net -500 ml   Filed Weights   11/17/20 1814  Weight: 66.7 kg    Examination:  General exam: Appears calm and comfortable  Respiratory system: Clear to auscultation. Respiratory effort normal. Cardiovascular system: S1 & S2 heard, RRR. No JVD, murmurs, rubs, gallops or clicks. No pedal edema. Gastrointestinal system: Abdomen is nondistended, soft and  nontender. No organomegaly or masses felt. Normal bowel sounds heard. Central nervous system: Alert and oriented. No focal neurological deficits. Extremities: Left lower extremity shortened.  With slightly decreased strength due to pain. Skin: No rashes, lesions or ulcers Psychiatry: Judgement and insight appear normal. Mood & affect appropriate.    Data Reviewed: I have personally reviewed following labs and imaging studies  CBC: Recent Labs  Lab 11/17/20 2217 11/18/20 0415  WBC 36.0* 24.6*  NEUTROABS 31.0*  --   HGB 16.0* 15.4*  HCT 44.3 41.8  MCV 85.4 84.4  PLT 397 156   Basic Metabolic Panel: Recent Labs  Lab 11/17/20 2217 11/18/20 0415  NA 122* 121*  K 4.2 5.1  CL 83* 88*  CO2 25 22  GLUCOSE 170* 168*  BUN 32* 24*  CREATININE 1.59* 1.20*  CALCIUM 9.9 9.3   GFR: Estimated Creatinine Clearance: 32.9 mL/min (A) (by C-G formula based on SCr of 1.2 mg/dL (H)). Liver Function Tests: No results for input(s): AST, ALT, ALKPHOS, BILITOT, PROT, ALBUMIN in the last 168 hours. No results for input(s): LIPASE, AMYLASE in the last 168 hours. No results for input(s): AMMONIA in the last 168  hours. Coagulation Profile: No results for input(s): INR, PROTIME in the last 168 hours. Cardiac Enzymes: No results for input(s): CKTOTAL, CKMB, CKMBINDEX, TROPONINI in the last 168 hours. BNP (last 3 results) No results for input(s): PROBNP in the last 8760 hours. HbA1C: No results for input(s): HGBA1C in the last 72 hours. CBG: No results for input(s): GLUCAP in the last 168 hours. Lipid Profile: No results for input(s): CHOL, HDL, LDLCALC, TRIG, CHOLHDL, LDLDIRECT in the last 72 hours. Thyroid Function Tests: No results for input(s): TSH, T4TOTAL, FREET4, T3FREE, THYROIDAB in the last 72 hours. Anemia Panel: No results for input(s): VITAMINB12, FOLATE, FERRITIN, TIBC, IRON, RETICCTPCT in the last 72 hours. Sepsis Labs: No results for input(s): PROCALCITON, LATICACIDVEN in the last 168 hours.  Recent Results (from the past 240 hour(s))  Resp Panel by RT-PCR (Flu A&B, Covid) Nasopharyngeal Swab     Status: None   Collection Time: 11/17/20 10:17 PM   Specimen: Nasopharyngeal Swab; Nasopharyngeal(NP) swabs in vial transport medium  Result Value Ref Range Status   SARS Coronavirus 2 by RT PCR NEGATIVE NEGATIVE Final    Comment: (NOTE) SARS-CoV-2 target nucleic acids are NOT DETECTED.  The SARS-CoV-2 RNA is generally detectable in upper respiratory specimens during the acute phase of infection. The lowest concentration of SARS-CoV-2 viral copies this assay can detect is 138 copies/mL. A negative result does not preclude SARS-Cov-2 infection and should not be used as the sole basis for treatment or other patient management decisions. A negative result may occur with  improper specimen collection/handling, submission of specimen other than nasopharyngeal swab, presence of viral mutation(s) within the areas targeted by this assay, and inadequate number of viral copies(<138 copies/mL). A negative result must be combined with clinical observations, patient history, and  epidemiological information. The expected result is Negative.  Fact Sheet for Patients:  EntrepreneurPulse.com.au  Fact Sheet for Healthcare Providers:  IncredibleEmployment.be  This test is no t yet approved or cleared by the [place] FDA and  has been authorized for detection and/or diagnosis of SARS-CoV-2 by FDA under an Emergency Use Authorization (EUA). This EUA will remain  in effect (meaning this test can be used) for the duration of the COVID-19 declaration under Section 564(b)(1) of the Act, 21 U.S.C.section 360bbb-3(b)(1), unless the authorization is terminated  or revoked sooner.  Influenza A by PCR NEGATIVE NEGATIVE Final   Influenza B by PCR NEGATIVE NEGATIVE Final    Comment: (NOTE) The Xpert Xpress SARS-CoV-2/FLU/RSV plus assay is intended as an aid in the diagnosis of influenza from Nasopharyngeal swab specimens and should not be used as a sole basis for treatment. Nasal washings and aspirates are unacceptable for Xpert Xpress SARS-CoV-2/FLU/RSV testing.  Fact Sheet for Patients: EntrepreneurPulse.com.au  Fact Sheet for Healthcare Providers: IncredibleEmployment.be  This test is not yet approved or cleared by the [place] FDA and has been authorized for detection and/or diagnosis of SARS-CoV-2 by FDA under an Emergency Use Authorization (EUA). This EUA will remain in effect (meaning this test can be used) for the duration of the COVID-19 declaration under Section 564(b)(1) of the Act, 21 U.S.C. section 360bbb-3(b)(1), unless the authorization is terminated or revoked.  Performed at [facility], [address], [place], [place] [address]       Radiology Studies: CT Lumbar Spine Wo Contrast  Result Date: 11/18/2020 CLINICAL DATA:  Initial evaluation for left lower back pain radiating into the left lower extremity. EXAM: CT LUMBAR SPINE WITHOUT CONTRAST  TECHNIQUE: Multidetector CT imaging of the lumbar spine was performed without intravenous contrast administration. Multiplanar CT image reconstructions were also generated. COMPARISON:  Prior MRI from 08/02/2020. FINDINGS: Segmentation: Standard. Lowest well-formed disc space labeled the L5-S1 level. Alignment: Severe levoscoliosis.  With apex at L1-2. No listhesis. Vertebrae: Vertebral body height maintained without acute or interval fracture. Subacute bilateral sacral insufficiency fractures again seen, with fracture line extending through the S2 segment, relatively similar to previous MRI. No other new or interval fracture. No discrete lytic or blastic osseous lesions. Paraspinal and other soft tissues: Paraspinous soft tissues demonstrate no acute finding. Moderate aorto bi-iliac atherosclerotic disease. Moderate distension of the partially visualized stomach. Disc levels: T11-12: Disc desiccation without significant disc bulge. Right-sided reactive endplate change. Moderate right with mild left facet hypertrophy. No spinal stenosis. Mild to moderate bilateral foraminal narrowing. T12-L1: Right-sided reactive endplate spurring without significant disc bulge. Mild right-sided facet hypertrophy. No significant spinal stenosis. Foramina appear patent. L1-2: Mild diffuse disc bulge, eccentric to the left. Superimposed shallow central disc protrusion mildly indents the ventral thecal sac. Moderate right with mild left facet hypertrophy. No significant spinal stenosis. Foramina remain patent. Appearance is stable. L2-3: Diffuse disc bulge with disc desiccation. Right-sided reactive endplate spurring. Moderate right worse than left facet arthrosis. Mild spinal stenosis with mild to moderate right lateral recess narrowing. Moderate right foraminal narrowing. Left neural foramina remains patent. L3-4: Mild diffuse disc bulge with reactive endplate spurring, greater on the left. Moderate facet hypertrophy. Resultant  moderate left lateral recess stenosis. Moderate left with mild right L3 foraminal narrowing. L4-5: Mild disc bulge. Severe left with moderate right facet hypertrophy. Mild narrowing of the left lateral recess. Mild to moderate left foraminal narrowing. Right neural foramen remains patent. L5-S1: Minimal annular disc bulge. Severe left with moderate right facet hypertrophy. No spinal stenosis. Foramina remain patent. IMPRESSION: 1. Subacute bilateral sacral insufficiency fractures, relatively similar in appearance to previous MRI. 2. No other new acute abnormality within the lumbar spine. 3. 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 Appearance is relatively stable from previous. Please see above report for a full description of these findings. 4. Aortic Atherosclerosis (ICD10-I70.0). Electronically Signed   By: Jeannine Boga M.D.   On: 11/18/2020 00:12   DG Chest Port 1 View  Result Date: 11/18/2020  CLINICAL DATA:  Progressive worsening of back pain. EXAM: PORTABLE CHEST 1 VIEW COMPARISON:  04/11/2020 FINDINGS: Heart size and pulmonary vascularity are normal. Lungs are clear. Thoracic scoliosis convex towards the right with degenerative changes. No pleural effusions. No pneumothorax. Calcification of the aorta. IMPRESSION: No active disease. Electronically Signed   By: Lucienne Capers M.D.   On: 11/18/2020 02:20   DG HIP UNILAT WITH PELVIS 2-3 VIEWS LEFT  Result Date: 11/18/2020 CLINICAL DATA:  Worsening of back pain today. EXAM: DG HIP (WITH OR WITHOUT PELVIS) 2-3V LEFT COMPARISON:  None. FINDINGS: Transverse subcapital fracture of the left femoral neck with varus angulation. Fractures are also present of the left superior and inferior pubic rami, possibly with healing. Degenerative changes in the lower lumbar spine and both hips. IMPRESSION: Transverse subcapital fracture of the left femoral neck with varus angulation.  Fractures of the left superior and inferior pubic rami possibly demonstrating healing. Electronically Signed   By: Lucienne Capers M.D.   On: 11/18/2020 02:22    Scheduled Meds: Continuous Infusions: . sodium chloride 125 mL/hr at 11/18/20 0924  . acetaminophen       LOS: 0 days   Time spent: 35 minutes   Darliss Cheney, MD [facility]  11/18/2020, 11:14 AM   To contact the attending provider between 7A-7P or the covering provider during after hours 7P-7A, please log into the web site [email or web address].

## 2020-11-18 NOTE — ED Notes (Signed)
Assisting primary RN. Pt in and out cathed by Claris Gladden RN with this RN assisting. Sterility remained throughout procedure, pt tolerated well. 459mL urine obtained. Sample sent to lab.

## 2020-11-18 NOTE — H&P (Addendum)
History and Physical    Linda Bradley JSE:[id number] DOB: [birth year] DOA: 11/17/2020  PCP: Lynnell Jude, MD   Patient coming from: Home  I have personally briefly reviewed patient's old medical records in [place]  Chief Complaint: Left leg pain  HPI: Linda Bradley is a 79 y.o. 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   She denied falling or making any sudden movement that may have precipitated the pain. States prior to the onset of the pain she was in her usual state of health and felt like she was doing fairly well.  She denies recent illness.  Patient had been on oxygen since she got Covid in January but was gradually weaned off and turned in her oxygen tank a few weeks prior.  She denies chest pain, shortness of breath, palpitations, fever or chills.  Denies dysuria, change in bowel habits, no vomiting.  Has nausea but this has been the same for several months.  Denies headache, visual disturbance, weakness in face or extremities. ED Course: On arrival she was afebrile, BP 168/78 heart rate 98 O2 sat 96% on room air.  She had a CT lumbar spine that showed subacute bilateral sacral insufficiency fractures similar in appearance to previous MRI with no other acute abnormality.  She does have moderate to advanced multilevel degenerative spondylosis and facet arthrosis relatively stable from prior CTs.  Blood work, significant for leukocytosis of 36,000 with  hemoglobin 16.  Sodium 122, BUN/creatinine of 32/1.59.  Creatinine was 1.36 three months prior.  Urinalysis showed proteinuria large hemoglobin and rare bacteria.  Covid and flu test negative.  Patient was treated with prednisone and narcotics in the emergency room without significant improvement.  Hospitalist consulted for admission. EKG: Sinus tachycardia at 100 with no acute ST-T wave changes  Following admission, hip x-ray was done showing " transverse subcapital fracture of the left femoral neck with varus angulation with fractures of the left superior and inferior pubic rami possibly demonstrating healing" Discussed with orthopedist, Dr. Roland Rack who would like to take patient to the OR in the a.m.   Review of Systems: As per HPI otherwise all other systems on review of systems negative.    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 Abnormal relaxation. Mild aortic stenosis (peak gradient 19 mmHg).  reports that she has never smoked. She has never used smokeless tobacco. She reports that she does not drink alcohol and does not use drugs.  Allergies  Allergen Reactions   Amlodipine Swelling   Coreg [Carvedilol] Nausea Only and Other (See Comments)    Syncope   Statins Other (See Comments)    Other Reaction: CNS DISORDER   Chlorthalidone Diarrhea      Family History  Problem Relation Age of Onset   Heart disease Father    Heart failure Father       Prior to Admission medications   Medication Sig Start Date End Date Taking? Authorizing Provider  atenolol (TENORMIN) 25 MG tablet Take 1 tablet (25 mg total) by mouth daily. 03/29/20   Fritzi Mandes, MD  clonazePAM (KLONOPIN) 0.5 MG tablet Take 0.5 mg by mouth 3 (three) times daily as needed for anxiety.     [provider]  cloNIDine (CATAPRES) 0.1 MG tablet Take 1 tablet (0.1 mg total) by mouth 3 (three) times daily. Patient taking differently: Take 0.1 mg by mouth 2 (two) times daily. Take and extra one if >160 if needed 05/01/17 03/16/20  Delman Kitten, MD  HYDROcodone-acetaminophen (NORCO/VICODIN) 5-325 MG tablet Take 1 tablet by mouth every 4 (four) hours as needed for moderate pain. 07/17/20   Cuthriell, Charline Bills, PA-C  iron polysaccharides (NIFEREX) 150 MG capsule Take 1 capsule (150 mg total) by mouth daily. 03/28/20   Fritzi Mandes, MD  losartan (COZAAR) 100 MG tablet Take 1 tablet (100 mg total) by mouth daily. 04/15/20   Lorella Nimrod, MD  methocarbamol (ROBAXIN) 500 MG tablet Take 1 tablet (500 mg total) by mouth 4 (four) times daily. 07/17/20   Cuthriell, Charline Bills, PA-C  Nutritional Supplements (FEEDING SUPPLEMENT, NEPRO CARB STEADY,) LIQD Take 237 mLs by mouth daily. 03/29/20   Fritzi Mandes, MD  pantoprazole (PROTONIX) 40 MG tablet Take 1 tablet (40 mg total) by mouth daily. 04/14/20 04/14/21  Lorella Nimrod, MD  predniSONE (DELTASONE) 10 MG tablet Take 1 tablet (10 mg total) by mouth daily. 07/17/20   Cuthriell, Charline Bills, PA-C  predniSONE (DELTASONE) 50 MG tablet Take 1 tablet (50 mg total) by mouth daily with breakfast. 04/14/20   Lorella Nimrod, MD  sulfamethoxazole-trimethoprim (BACTRIM) 400-80 MG tablet Take 1 tablet by mouth 3 (three) times a week. 04/14/20   Lorella Nimrod, MD  vitamin B-12 1000 MCG tablet Take 1 tablet (1,000 mcg total) by mouth daily. 03/28/20   Fritzi Mandes, MD     Physical Exam: Vitals:   11/18/20 0030 11/18/20 0045 11/18/20 0100 11/18/20 0115  BP:      Pulse: 86 96  (!) 102  Resp: 16 17  14   Temp:      TempSrc:      SpO2: 92% 95% 95% 94%  Weight:      Height:         Vitals:   11/18/20 0030 11/18/20 0045 11/18/20 0100 11/18/20 0115  BP:      Pulse: 86 96  (!) 102  Resp: 16 17  14   Temp:      TempSrc:      SpO2: 92% 95% 95% 94%  Weight:      Height:          Constitutional: Alert and oriented x 3 .  In pain discomfort especially with movement HEENT:      Head: Normocephalic and atraumatic.         Eyes: PERLA, EOMI, Conjunctivae are normal. Sclera  is non-icteric.       Mouth/Throat: Mucous membranes are moist.       Neck: Supple with no signs of meningismus. Cardiovascular: Regular rate and rhythm. No murmurs, gallops, or rubs. 2+ symmetrical distal pulses are present . No JVD. No LE edema Respiratory: Respiratory effort normal .Lungs sounds clear bilaterally. No wheezes, crackles, or rhonchi.  Gastrointestinal: Soft, non tender, and non distended with positive bowel sounds. No rebound or guarding. Genitourinary: No CVA tenderness. Musculoskeletal:  Pain with minimal range of motion of left hip. No cyanosis, or erythema of extremities. Neurologic:  Face is symmetric. Moving all extremities except when limited by pain. No gross focal neurologic deficits . Skin: Skin is warm, dry.  No rash or ulcers Psychiatric: Mood and affect are normal    Labs on Admission: I have personally reviewed following labs and imaging studies  CBC: Recent Labs  Lab 11/17/20 2217  WBC 36.0*  NEUTROABS 31.0*  HGB 16.0*  HCT 44.3  MCV 85.4  PLT 144   Basic Metabolic Panel: Recent Labs  Lab 11/17/20 2217  NA 122*  K 4.2  CL 83*  CO2 25  GLUCOSE 170*  BUN 32*  CREATININE 1.59*  CALCIUM 9.9   GFR: Estimated Creatinine Clearance: 24.9 mL/min (A) (by C-G formula based on SCr of 1.59 mg/dL (H)). Liver Function Tests: No results  for input(s): AST, ALT, ALKPHOS, BILITOT, PROT, ALBUMIN in the last 168 hours. No results for input(s): LIPASE, AMYLASE in the last 168 hours. No results for input(s): AMMONIA in the last 168 hours. Coagulation Profile: No results for input(s): INR, PROTIME in the last 168 hours. Cardiac Enzymes: No results for input(s): CKTOTAL, CKMB, CKMBINDEX, TROPONINI in the last 168 hours. BNP (last 3 results) No results for input(s): PROBNP in the last 8760 hours. HbA1C: No results for input(s): HGBA1C in the last 72 hours. CBG: No results for input(s): GLUCAP in the last 168 hours. Lipid Profile: No results for input(s): CHOL, HDL, LDLCALC, TRIG, CHOLHDL, LDLDIRECT in the last 72 hours. Thyroid Function Tests: No results for input(s): TSH, T4TOTAL, FREET4, T3FREE, THYROIDAB in the last 72 hours. Anemia Panel: No results for input(s): VITAMINB12, FOLATE, FERRITIN, TIBC, IRON, RETICCTPCT in the last 72 hours. Urine analysis:    Component Value Date/Time   COLORURINE YELLOW (A) 11/18/2020 0104   APPEARANCEUR CLEAR (A) 11/18/2020 0104   LABSPEC 1.011 11/18/2020 0104   PHURINE 5.0 11/18/2020 0104   GLUCOSEU 50 (A) 11/18/2020 0104   HGBUR LARGE (A) 11/18/2020 0104   BILIRUBINUR NEGATIVE 11/18/2020 0104   KETONESUR NEGATIVE 11/18/2020 0104   PROTEINUR 100 (A) 11/18/2020 0104   NITRITE NEGATIVE 11/18/2020 0104   LEUKOCYTESUR NEGATIVE 11/18/2020 0104    Radiological Exams on Admission: CT Lumbar Spine Wo Contrast  Result Date: 11/18/2020 CLINICAL DATA:  Initial evaluation for left lower back pain radiating into the left lower extremity. EXAM: CT LUMBAR SPINE WITHOUT CONTRAST TECHNIQUE: Multidetector CT imaging of the lumbar spine was performed without intravenous contrast administration. Multiplanar CT image reconstructions were also generated. COMPARISON:  Prior MRI from 08/02/2020. FINDINGS: Segmentation: Standard. Lowest well-formed disc space labeled the L5-S1 level. Alignment: Severe  levoscoliosis.  With apex at L1-2. No listhesis. Vertebrae: Vertebral body height maintained without acute or interval fracture. Subacute bilateral sacral insufficiency fractures again seen, with fracture line extending through the S2 segment, relatively similar to previous MRI. No other new or interval fracture. No discrete lytic or blastic osseous lesions. Paraspinal and other soft tissues: Paraspinous soft tissues  demonstrate no acute finding. Moderate aorto bi-iliac atherosclerotic disease. Moderate distension of the partially visualized stomach. Disc levels: T11-12: Disc desiccation without significant disc bulge. Right-sided reactive endplate change. Moderate right with mild left facet hypertrophy. No spinal stenosis. Mild to moderate bilateral foraminal narrowing. T12-L1: Right-sided reactive endplate spurring without significant disc bulge. Mild right-sided facet hypertrophy. No significant spinal stenosis. Foramina appear patent. L1-2: Mild diffuse disc bulge, eccentric to the left. Superimposed shallow central disc protrusion mildly indents the ventral thecal sac. Moderate right with mild left facet hypertrophy. No significant spinal stenosis. Foramina remain patent. Appearance is stable. L2-3: Diffuse disc bulge with disc desiccation. Right-sided reactive endplate spurring. Moderate right worse than left facet arthrosis. Mild spinal stenosis with mild to moderate right lateral recess narrowing. Moderate right foraminal narrowing. Left neural foramina remains patent. L3-4: Mild diffuse disc bulge with reactive endplate spurring, greater on the left. Moderate facet hypertrophy. Resultant moderate left lateral recess stenosis. Moderate left with mild right L3 foraminal narrowing. L4-5: Mild disc bulge. Severe left with moderate right facet hypertrophy. Mild narrowing of the left lateral recess. Mild to moderate left foraminal narrowing. Right neural foramen remains patent. L5-S1: Minimal annular disc bulge.  Severe left with moderate right facet hypertrophy. No spinal stenosis. Foramina remain patent. IMPRESSION: 1. Subacute bilateral sacral insufficiency fractures, relatively similar in appearance to previous MRI. 2. No other new acute abnormality within the lumbar spine. 3. 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 Appearance is relatively stable from previous. Please see above report for a full description of these findings. 4. Aortic Atherosclerosis (ICD10-I70.0). Electronically Signed   By: Jeannine Boga M.D.   On: 11/18/2020 00:12     Assessment/Plan 79 year old female with history of HTN, anxiety, CKD 3b, pauci-immune glomerulonephritis on prednisone 10 mg daily, followed by nephrology, as well as chronic back pain presenting with acute left hip pain with inability to bear weight.  No history of fall.  X-ray showing transverse subcapital fracture left hip  Closed left hip fracture, suspect pathologic fracture Left superior and inferior pubic rami fractures Preoperative clearance -Patient with sudden onset left hip pain without preceding trauma or sudden movement -Hip x-ray done following admission shows" transverse subcapital fracture of the left femoral neck with varus angulation with fractures of the left superior and inferior pubic rami possibly demonstrating healing" -Orthopedic consult. Discussed with Dr Roland Rack who will take patient to the OR in the am -Patient is at low risk for perioperative cardiopulmonary complications -Can proceed with surgery, once sodium over 126, given low-dose of home prednisone, low risk of adrenal insufficiency so stress dose IV steroid may not be needed -Pain control   Hyponatremia -Sodium 122, suspecting hypovolemic -Follow urine and sodium osmolarity and urine sodium -We will empirically start normal saline infusion -Monitor serum sodium  History of Covid requiring  chronic oxygen use -Patient had Covid in January 2021 has been weaned off of chronic oxygen within the past month -Saturating in the high 90s on room air  History of lumbar radiculopathy requiring epidural steroid injection Chronic DDD -CT LS spine showing stable findings from prior imaging including insufficiency fractures, moderate to advanced DDD and facet arthropathy     Leukocytosis -WBC of 36,000 without stigmata of infection -Possibly related to chronic prednisone therapy and acute fracture -Urine with only rare bacteria.  Chest x-ray with no active disease - monitor WBC with hydration    Essential hypertension -Continue home amlodipine and atenolol  Pauci-immune Glomerulonephritis, April 2021   On long term prednisone therapy   Stage 3b chronic kidney disease ([place]) -Patient followed by nephrology, last seen 11/13/2020 -Continue prednisone 10 mg daily    DVT prophylaxis: SCDs Code Status: full code  Family Communication:  none  Disposition Plan: Back to previous home environment Consults called: Orthopedics Status: At the time of admission, it appears that the appropriate admission status for this patient is INPATIENT. This is judged to be reasonable and necessary in order to provide the required intensity of service to ensure the patient's safety given the presenting symptoms, physical exam findings, and initial radiographic and laboratory data in the context of their  Comorbid conditions.   Patient requires inpatient status due to high intensity of service, high risk for further deterioration and high frequency of surveillance required.   I certify that at the point of admission it is my clinical judgment that the patient will require inpatient hospital care spanning beyond [place] MD [facility]     11/18/2020, 1:32 AM

## 2020-11-18 NOTE — Anesthesia Postprocedure Evaluation (Signed)
Anesthesia Post Note  Patient: Linda Bradley  Procedure(s) Performed: ARTHROPLASTY BIPOLAR HIP (HEMIARTHROPLASTY) (Left Hip)  Patient location during evaluation: PACU Anesthesia Type: General Level of consciousness: awake and alert Pain management: pain level controlled Vital Signs Assessment: post-procedure vital signs reviewed and stable Respiratory status: spontaneous breathing, nonlabored ventilation, respiratory function stable and patient connected to nasal cannula oxygen Cardiovascular status: blood pressure returned to baseline, stable and tachycardic (tachycardic preoperatively as well) Postop Assessment: no apparent nausea or vomiting Anesthetic complications: no   No complications documented.   Last Vitals:  Vitals:   11/18/20 1128 11/18/20 1215  BP: (!) 149/90 (!) 160/92  Pulse: (!) 108 (!) 125  Resp: (!) 9 17  Temp: 36.6 C   SpO2: 100% 96%    Last Pain:  Vitals:   11/18/20 1215  TempSrc:   PainSc: 4                  Arita Miss

## 2020-11-18 NOTE — Op Note (Signed)
11/17/2020 - 11/18/2020  11:12 AM  Patient:   Linda Bradley  Pre-Op Diagnosis:   Displaced femoral neck fracture, left hip.  Post-Op Diagnosis:   Same.  Procedure:   Left hip unipolar hemiarthroplasty.  Surgeon:   Pascal Lux, MD  Assistant:   Cameron Proud, PA-C  Anesthesia:   GET  Findings:   As above.  Complications:   None  EBL:   100 cc  Fluids:   2000 cc crystalloid  UOP:   None  TT:   None  Drains:   None  Closure:   Staples  Implants:   Biomet press-fit system with a #11 standard offset reduced proximal profile Echo femoral stem, a 41 mm outer diameter shell, and a -6 mm neck adapter.  Brief Clinical Note:   The patient is a 79 year old female who sustained the above-noted injury several days ago. Apparently, she did not actually fall, but merely began to notice increased pain in her hip. She began to ambulate with a walker but her symptoms worsened, prompting her to come to the emergency room last evening where x-rays demonstrated the above-noted injury. The patient has been cleared medically and presents at this time for definitive management of the injury.  Procedure:   The patient was brought into the operating room. After adequate general endotracheal intubation and anesthesia was obtained, the patient was repositioned in the right lateral decubitus position and secured using a lateral hip positioner. The left hip and lower extremity were prepped with ChloroPrep solution before being draped sterilely. Preoperative antibiotics were administered. A timeout was performed to verify the appropriate surgical site before a standard posterior approach to the hip was made through an approximately 4-5 inch incision. The incision was carried down through the subcutaneous tissues to expose the gluteal fascia and proximal end of the iliotibial band. These structures were split the length of the incision and the Charnley self-retaining hip retractor placed. The bursal  tissues were swept posteriorly to expose the short external rotators. The anterior border of the piriformis tendon was identified and this plane developed down through the capsule to enter the joint. Abundant fracture hematoma was suctioned. A flap of tissue was elevated off the posterior aspect of the femoral neck and greater trochanter and retracted posteriorly. This flap included the piriformis tendon, the short external rotators, and the posterior capsule. The femoral head was removed in its entirety, then taken to the back table where it was measured and found to be optimally replicated by a 41 mm head. The appropriate trial head was inserted and found to demonstrate an excellent suction fit.   Attention was directed to the femoral side. The femoral neck was recut 10-12 mm above the lesser trochanter using an oscillating saw. The piriformis fossa was debrided of soft tissues before the intramedullary canal was accessed through this point using a triple step reamer. The canal broached sequentially beginning with a #7 broach and progressing to a #11 broach. This was left in place and several trial reductions performed. The permanent #11 reduced proximal profile femoral stem was impacted into place. A repeat trial reduction was performed using the -6 mm neck length. The -6 mm neck length demonstrated excellent stability both in extension and external rotation as well as with flexion to 90 and internal rotation beyond 70. It also was stable in the position of sleep. The 41 mm outer diameter shell with the -6 mm neck adapter construct was put together on the back table before being  impacted onto the stem of the femoral component. The Morse taper locking mechanism was verified using manual distraction before the head was relocated and the hip placed through a range of motion with the findings as described above.  The wound was copiously irrigated with bacitracin saline solution via the jet lavage system before  the peri-incisional and pericapsular tissues were injected with 30 cc of 0.5% Sensorcaine with epinephrine and 20 cc of Exparel diluted out to 60 cc with normal saline to help with postoperative analgesia. The posterior flap was reapproximated to the posterior aspect of the greater trochanter using #2 Tycron interrupted sutures placed through drill holes. The iliotibial band was reapproximated using #1 Vicryl interrupted sutures before the gluteal fascia was closed using a running #1 Vicryl suture. At this point, 1 g of transexemic acid in 10 cc of normal saline was injected into the joint to help reduce postoperative bleeding. The subcutaneous tissues were closed in several layers using 2-0 Vicryl interrupted sutures before the skin was closed using staples. A sterile occlusive dressing was applied to the wound . The patient then was rolled back into the supine position on the hospital bed before being awakened, extubated, and returned to the recovery room in satisfactory condition after tolerating the procedure well.

## 2020-11-18 NOTE — Consult Note (Signed)
ORTHOPAEDIC CONSULTATION  REQUESTING PHYSICIAN: Darliss Cheney, MD  Chief Complaint:   Left hip pain.  History of Present Illness: Linda Bradley is a 79 y.o. female with multiple medical problems including hypertension, a heart murmur secondary to aortic stenosis, hypercholesterolemia, chronic kidney disease, anxiety disorder, osteoporosis, and granulomatosis with polyangiitis for which she has been on a slow prednisone taper who normally lives independently.  The patient noted the onset of left hip pain radiating down the left thigh to her knee about 3 days ago.  The symptoms developed without any specific injury.  Since the symptoms developed, the patient has been ambulating with a walker.  However, her symptoms progressively worsened, prompting her to go to the emergency room where x-rays demonstrated a displaced left femoral neck fracture.  The patient denies any numbness or paresthesias down her leg to her foot, and denies any bowel or bladder complaints.  However, she recently completed an antifungal treatment for a vaginal yeast infection.  The patient lives independently with her son normally, and normally does not use any assistive devices.  However, she has been on home O2 since contracting Covid.  Past Medical History:  Diagnosis Date  . Anxiety disorder   . Dermatitis   . Granulomatosis with polyangiitis ([place]) 03/28/2020  . Heart murmur   . Hypercholesterolemia   . Hypertension   . Lymphadenitis   . Renal disorder   . Skin cancer   . Spontaneous ecchymoses   . Vitamin D deficiency    Past Surgical History:  Procedure Laterality Date  . CARPAL TUNNEL RELEASE     [facility]  . ESOPHAGOGASTRODUODENOSCOPY (EGD) WITH PROPOFOL N/A 04/14/2020   Procedure: ESOPHAGOGASTRODUODENOSCOPY (EGD) WITH PROPOFOL;  Surgeon: Lucilla Lame, MD;  Location: [facility] ENDOSCOPY;  Service: Endoscopy;  Laterality: N/A;  . TONSILLECTOMY    .  TRANSTHORACIC ECHOCARDIOGRAM  05/01/2015    EF 60-65%. Abnormal relaxation. Mild aortic stenosis (peak gradient 19 mmHg).   Social History   Socioeconomic History  . Marital status: Widowed    Spouse name: Not on file  . Number of children: Not on file  . Years of education: Not on file  . Highest education level: Not on file  Occupational History  . Not on file  Tobacco Use  . Smoking status: Never Smoker  . Smokeless tobacco: Never Used  Vaping Use  . Vaping Use: Never used  Substance and Sexual Activity  . Alcohol use: No  . Drug use: No  . Sexual activity: Not on file  Other Topics Concern  . Not on file  Social History Narrative  . Not on file   Social Determinants of Health   Financial Resource Strain:   . Difficulty of Paying Living Expenses: Not on file  Food Insecurity:   . Worried About Charity fundraiser in the Last Year: Not on file  . Ran Out of Food in the Last Year: Not on file  Transportation Needs:   . Lack of Transportation (Medical): Not on file  . Lack of Transportation (Non-Medical): Not on file  Physical Activity:   . Days of Exercise per Week: Not on file  . Minutes of Exercise per Session: Not on file  Stress:   . Feeling of Stress : Not on file  Social Connections:   . Frequency of Communication with Friends and Family: Not on file  . Frequency of Social Gatherings with Friends and Family: Not on file  . Attends Religious Services: Not on file  . Active  Member of Clubs or Organizations: Not on file  . Attends Archivist Meetings: Not on file  . Marital Status: Not on file   Family History  Problem Relation Age of Onset  . Heart disease Father   . Heart failure Father    Allergies  Allergen Reactions  . Amlodipine Swelling  . Coreg [Carvedilol] Nausea Only and Other (See Comments)    Syncope  . Statins Other (See Comments)    Other Reaction: CNS DISORDER  . Chlorthalidone Diarrhea   Prior to Admission medications    Medication Sig Start Date End Date Taking? Authorizing Provider  amLODipine (NORVASC) 5 MG tablet Take 5 mg by mouth daily. 08/29/20  Yes [provider]  atenolol (TENORMIN) 25 MG tablet Take 1 tablet (25 mg total) by mouth daily. Patient taking differently: Take 12.5 mg by mouth daily.  03/29/20  Yes Fritzi Mandes, MD  clonazePAM (KLONOPIN) 0.5 MG tablet Take 0.5 mg by mouth 3 (three) times daily as needed for anxiety.    Yes [provider]  cloNIDine (CATAPRES) 0.2 MG tablet Take 0.2 mg by mouth 3 (three) times daily. 11/13/20  Yes [provider]  Ferrous Sulfate (SLOW FE) 142 (45 Fe) MG TBCR Take 45 mg of iron by mouth daily.   Yes [provider]  furosemide (LASIX) 20 MG tablet Take 20 mg by mouth every other day. Take 3 times a week on Tuesday, Thursday and Saturday 07/21/20 11/13/21 Yes [provider]  losartan (COZAAR) 100 MG tablet Take 1 tablet (100 mg total) by mouth daily. 04/15/20  Yes Lorella Nimrod, MD  oxyCODONE (OXY IR/ROXICODONE) 5 MG immediate release tablet Take 2.5 mg by mouth 3 (three) times daily as needed for severe pain.  08/10/20  Yes [provider]  pantoprazole (PROTONIX) 40 MG tablet Take 1 tablet (40 mg total) by mouth daily. Patient taking differently: Take 40 mg by mouth every other day.  04/14/20 04/14/21 Yes Lorella Nimrod, MD  predniSONE (DELTASONE) 10 MG tablet Take 1 tablet (10 mg total) by mouth daily. 07/17/20  Yes Cuthriell, Charline Bills, PA-C  spironolactone (ALDACTONE) 25 MG tablet Take 25 mg by mouth daily. 11/08/20  Yes [provider]  vitamin B-12 1000 MCG tablet Take 1 tablet (1,000 mcg total) by mouth daily. 03/28/20  Yes Fritzi Mandes, MD  Nutritional Supplements (FEEDING SUPPLEMENT, NEPRO CARB STEADY,) LIQD Take 237 mLs by mouth daily. 03/29/20   Fritzi Mandes, MD   CT Lumbar Spine Wo Contrast  Result Date: 11/18/2020 CLINICAL DATA:  Initial evaluation for left lower back pain radiating into the left  lower extremity. EXAM: CT LUMBAR SPINE WITHOUT CONTRAST TECHNIQUE: Multidetector CT imaging of the lumbar spine was performed without intravenous contrast administration. Multiplanar CT image reconstructions were also generated. COMPARISON:  Prior MRI from 08/02/2020. FINDINGS: Segmentation: Standard. Lowest well-formed disc space labeled the L5-S1 level. Alignment: Severe levoscoliosis.  With apex at L1-2. No listhesis. Vertebrae: Vertebral body height maintained without acute or interval fracture. Subacute bilateral sacral insufficiency fractures again seen, with fracture line extending through the S2 segment, relatively similar to previous MRI. No other new or interval fracture. No discrete lytic or blastic osseous lesions. Paraspinal and other soft tissues: Paraspinous soft tissues demonstrate no acute finding. Moderate aorto bi-iliac atherosclerotic disease. Moderate distension of the partially visualized stomach. Disc levels: T11-12: Disc desiccation without significant disc bulge. Right-sided reactive endplate change. Moderate right with mild left facet hypertrophy. No spinal stenosis. Mild to moderate bilateral foraminal narrowing.  T12-L1: Right-sided reactive endplate spurring without significant disc bulge. Mild right-sided facet hypertrophy. No significant spinal stenosis. Foramina appear patent. L1-2: Mild diffuse disc bulge, eccentric to the left. Superimposed shallow central disc protrusion mildly indents the ventral thecal sac. Moderate right with mild left facet hypertrophy. No significant spinal stenosis. Foramina remain patent. Appearance is stable. L2-3: Diffuse disc bulge with disc desiccation. Right-sided reactive endplate spurring. Moderate right worse than left facet arthrosis. Mild spinal stenosis with mild to moderate right lateral recess narrowing. Moderate right foraminal narrowing. Left neural foramina remains patent. L3-4: Mild diffuse disc bulge with reactive endplate spurring, greater  on the left. Moderate facet hypertrophy. Resultant moderate left lateral recess stenosis. Moderate left with mild right L3 foraminal narrowing. L4-5: Mild disc bulge. Severe left with moderate right facet hypertrophy. Mild narrowing of the left lateral recess. Mild to moderate left foraminal narrowing. Right neural foramen remains patent. L5-S1: Minimal annular disc bulge. Severe left with moderate right facet hypertrophy. No spinal stenosis. Foramina remain patent. IMPRESSION: 1. Subacute bilateral sacral insufficiency fractures, relatively similar in appearance to previous MRI. 2. No other new acute abnormality within the lumbar spine. 3. 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 Appearance is relatively stable from previous. Please see above report for a full description of these findings. 4. Aortic Atherosclerosis (ICD10-I70.0). Electronically Signed   By: Jeannine Boga M.D.   On: 11/18/2020 00:12   DG Chest Port 1 View  Result Date: 11/18/2020 CLINICAL DATA:  Progressive worsening of back pain. EXAM: PORTABLE CHEST 1 VIEW COMPARISON:  04/11/2020 FINDINGS: Heart size and pulmonary vascularity are normal. Lungs are clear. Thoracic scoliosis convex towards the right with degenerative changes. No pleural effusions. No pneumothorax. Calcification of the aorta. IMPRESSION: No active disease. Electronically Signed   By: Lucienne Capers M.D.   On: 11/18/2020 02:20   DG HIP UNILAT WITH PELVIS 2-3 VIEWS LEFT  Result Date: 11/18/2020 CLINICAL DATA:  Worsening of back pain today. EXAM: DG HIP (WITH OR WITHOUT PELVIS) 2-3V LEFT COMPARISON:  None. FINDINGS: Transverse subcapital fracture of the left femoral neck with varus angulation. Fractures are also present of the left superior and inferior pubic rami, possibly with healing. Degenerative changes in the lower lumbar spine and both hips. IMPRESSION: Transverse subcapital  fracture of the left femoral neck with varus angulation. Fractures of the left superior and inferior pubic rami possibly demonstrating healing. Electronically Signed   By: Lucienne Capers M.D.   On: 11/18/2020 02:22    Positive ROS: All other systems have been reviewed and were otherwise negative with the exception of those mentioned in the HPI and as above.  Physical Exam: General:  Alert, no acute distress Psychiatric:  Patient is competent for consent with normal mood and affect   Cardiovascular:  No pedal edema Respiratory:  No wheezing, non-labored breathing GI:  Abdomen is soft and non-tender Skin:  No lesions in the area of chief complaint Neurologic:  Sensation intact distally Lymphatic:  No axillary or cervical lymphadenopathy  Orthopedic Exam:  Orthopedic examination is limited to the left hip and lower extremity.  The left lower extremity somewhat shortened and externally rotated as compared to the right.  Skin inspection around the left hip is unremarkable.  No swelling, erythema, ecchymosis, abrasions, or other skin abnormalities are identified.  She has mild tenderness to palpation of the lateral aspect of the left hip.  She has more severe pain with any attempted active or passive  motion of the hip.  She is neurovascularly intact to the left lower extremity and foot, demonstrating the ability to actively dorsiflex and plantarflex her toes and ankle.  Sensation is intact to light touch to all distributions.  She has good capillary refill to her left foot.  X-rays:  X-rays of the pelvis and left hip are available for review and have been reviewed by myself.  These films demonstrate a varus displaced subacute fracture of the left femoral neck.  There is evidence of healing left superior and inferior pubic rami fractures, but no significant degenerative changes of the left hip are identified.  No lytic lesions or other acute bony processes are noted.  Assessment: Displaced left  femoral neck fracture.  Plan: The treatment options, including both surgical and nonsurgical choices, have been discussed in detail with the patient. She would like to proceed with surgical intervention to include a left hip hemiarthroplasty. The risks (including bleeding, infection, nerve and/or blood vessel injury, persistent or recurrent pain, loosening or failure of the components, leg length inequality, dislocation, need for further surgery, blood clots, strokes, heart attacks or arrhythmias, pneumonia, etc.) and benefits of the surgical procedure were discussed. The patient states her understanding and agrees to proceed. She agrees to a blood transfusion if necessary. A formal written consent will be obtained by the nursing staff.  Thank you for asked me to participate in the care of this most delightful woman.  I will be happy to follow her with you.   Pascal Lux, MD  Beeper #:  [phone]  11/18/2020 9:10 AM

## 2020-11-18 NOTE — ED Notes (Signed)
Pt taken to OR by [place].

## 2020-11-19 DIAGNOSIS — S72002A Fracture of unspecified part of neck of left femur, initial encounter for closed fracture: Secondary | ICD-10-CM | POA: Diagnosis not present

## 2020-11-19 LAB — CBC
HCT: 37.8 % (ref 36.0–46.0)
Hemoglobin: 13.6 g/dL (ref 12.0–15.0)
MCH: 31.1 pg (ref 26.0–34.0)
MCHC: 36 g/dL (ref 30.0–36.0)
MCV: 86.3 fL (ref 80.0–100.0)
Platelets: 293 10*3/uL (ref 150–400)
RBC: 4.38 MIL/uL (ref 3.87–5.11)
RDW: 13.4 % (ref 11.5–15.5)
WBC: 25.9 10*3/uL — ABNORMAL HIGH (ref 4.0–10.5)
nRBC: 0 % (ref 0.0–0.2)

## 2020-11-19 LAB — BASIC METABOLIC PANEL
Anion gap: 10 (ref 5–15)
BUN: 21 mg/dL (ref 8–23)
CO2: 21 mmol/L — ABNORMAL LOW (ref 22–32)
Calcium: 8.9 mg/dL (ref 8.9–10.3)
Chloride: 95 mmol/L — ABNORMAL LOW (ref 98–111)
Creatinine, Ser: 1.56 mg/dL — ABNORMAL HIGH (ref 0.44–1.00)
GFR, Estimated: 34 mL/min — ABNORMAL LOW (ref >60)
Glucose, Bld: 148 mg/dL — ABNORMAL HIGH (ref 70–99)
Potassium: 4.3 mmol/L (ref 3.5–5.1)
Sodium: 126 mmol/L — ABNORMAL LOW (ref 135–145)

## 2020-11-19 MED ORDER — METOCLOPRAMIDE HCL 5 MG/ML IJ SOLN: 5.0000 mg | Freq: Three times a day (TID) | INTRAMUSCULAR | Status: DC | PRN

## 2020-11-19 MED ORDER — ONDANSETRON HCL 4 MG PO TABS: 4.0000 mg | ORAL_TABLET | Freq: Four times a day (QID) | ORAL | Status: DC | PRN

## 2020-11-19 MED ORDER — MAGNESIUM HYDROXIDE 400 MG/5ML PO SUSP: 30.0000 mL | Freq: Every day | ORAL | Status: DC | PRN

## 2020-11-19 MED ORDER — METOCLOPRAMIDE HCL 10 MG PO TABS: 5.0000 mg | ORAL_TABLET | Freq: Three times a day (TID) | ORAL | Status: DC | PRN

## 2020-11-19 MED ORDER — ATENOLOL 25 MG PO TABS
12.5000 mg | ORAL_TABLET | Freq: Every day | ORAL | Status: DC
  Administered 2020-11-19 – 2020-11-20 (×2): 12.5 mg via ORAL
  Filled 2020-11-19 (×2): qty 1

## 2020-11-19 MED ORDER — DOCUSATE SODIUM 100 MG PO CAPS
100.0000 mg | ORAL_CAPSULE | Freq: Two times a day (BID) | ORAL | Status: DC
  Administered 2020-11-19 – 2020-11-21 (×7): 100 mg via ORAL
  Filled 2020-11-19 (×12): qty 1

## 2020-11-19 MED ORDER — ENOXAPARIN SODIUM 40 MG/0.4ML ~~LOC~~ SOLN: 40.0000 mg | SUBCUTANEOUS | 0 refills | Status: DC

## 2020-11-19 MED ORDER — TRAMADOL HCL 50 MG PO TABS: 50.0000 mg | ORAL_TABLET | Freq: Four times a day (QID) | ORAL | 0 refills | Status: DC | PRN

## 2020-11-19 MED ORDER — ONDANSETRON HCL 4 MG/2ML IJ SOLN: 4.0000 mg | Freq: Four times a day (QID) | INTRAMUSCULAR | Status: DC | PRN

## 2020-11-19 MED ORDER — DIPHENHYDRAMINE HCL 12.5 MG/5ML PO ELIX: 12.5000 mg | ORAL_SOLUTION | ORAL | Status: DC | PRN

## 2020-11-19 MED ORDER — LOSARTAN POTASSIUM 50 MG PO TABS
100.0000 mg | ORAL_TABLET | Freq: Every day | ORAL | Status: DC
  Administered 2020-11-19 – 2020-11-24 (×6): 100 mg via ORAL
  Filled 2020-11-19 (×6): qty 2

## 2020-11-19 MED ORDER — PREDNISONE 10 MG PO TABS
10.0000 mg | ORAL_TABLET | Freq: Every day | ORAL | Status: DC
  Administered 2020-11-19 – 2020-11-24 (×6): 10 mg via ORAL
  Filled 2020-11-19 (×6): qty 1

## 2020-11-19 MED ORDER — SPIRONOLACTONE 25 MG PO TABS
25.0000 mg | ORAL_TABLET | Freq: Every day | ORAL | Status: DC
  Administered 2020-11-20 – 2020-11-24 (×5): 25 mg via ORAL
  Filled 2020-11-19 (×5): qty 1

## 2020-11-19 MED ORDER — AMLODIPINE BESYLATE 5 MG PO TABS
5.0000 mg | ORAL_TABLET | Freq: Every day | ORAL | Status: DC
  Administered 2020-11-19 – 2020-11-23 (×5): 5 mg via ORAL
  Filled 2020-11-19 (×5): qty 1

## 2020-11-19 MED ORDER — SODIUM CHLORIDE 1 G PO TABS
1.0000 g | ORAL_TABLET | Freq: Two times a day (BID) | ORAL | Status: DC
  Administered 2020-11-19 – 2020-11-23 (×9): 1 g via ORAL
  Filled 2020-11-19 (×11): qty 1

## 2020-11-19 MED ORDER — HYDROCODONE-ACETAMINOPHEN 5-325 MG PO TABS
1.0000 | ORAL_TABLET | Freq: Four times a day (QID) | ORAL | 0 refills | Status: DC | PRN
Start: 2020-11-19 — End: 2021-04-18

## 2020-11-19 MED ORDER — TRAMADOL HCL 50 MG PO TABS: 50.0000 mg | ORAL_TABLET | Freq: Four times a day (QID) | ORAL | Status: DC | PRN

## 2020-11-19 MED ORDER — CLONIDINE HCL 0.1 MG PO TABS
0.2000 mg | ORAL_TABLET | Freq: Three times a day (TID) | ORAL | Status: DC
  Administered 2020-11-19 – 2020-11-24 (×13): 0.2 mg via ORAL
  Filled 2020-11-19 (×15): qty 2

## 2020-11-19 MED ORDER — MORPHINE SULFATE (PF) 2 MG/ML IV SOLN: 0.5000 mg | INTRAVENOUS | Status: DC | PRN

## 2020-11-19 MED ORDER — ENOXAPARIN SODIUM 40 MG/0.4ML ~~LOC~~ SOLN
40.0000 mg | SUBCUTANEOUS | Status: DC
  Administered 2020-11-19 – 2020-11-24 (×6): 40 mg via SUBCUTANEOUS
  Filled 2020-11-19 (×6): qty 0.4

## 2020-11-19 MED ORDER — ACETAMINOPHEN 500 MG PO TABS
500.0000 mg | ORAL_TABLET | Freq: Four times a day (QID) | ORAL | Status: AC
  Administered 2020-11-19 (×4): 500 mg via ORAL
  Filled 2020-11-19 (×4): qty 1

## 2020-11-19 MED ORDER — FUROSEMIDE 20 MG PO TABS
20.0000 mg | ORAL_TABLET | ORAL | Status: DC
  Administered 2020-11-21 – 2020-11-23 (×2): 20 mg via ORAL
  Filled 2020-11-19 (×2): qty 1

## 2020-11-19 MED ORDER — FLEET ENEMA 7-19 GM/118ML RE ENEM: 1.0000 | ENEMA | Freq: Once | RECTAL | Status: DC | PRN

## 2020-11-19 MED ORDER — ACETAMINOPHEN 325 MG PO TABS
325.0000 mg | ORAL_TABLET | Freq: Four times a day (QID) | ORAL | Status: DC | PRN
  Administered 2020-11-21 – 2020-11-23 (×2): 650 mg via ORAL
  Filled 2020-11-19 (×2): qty 2

## 2020-11-19 MED ORDER — ONDANSETRON HCL 4 MG PO TABS: 4.0000 mg | ORAL_TABLET | Freq: Four times a day (QID) | ORAL | 0 refills | Status: DC | PRN

## 2020-11-19 MED ORDER — VITAMIN B-12 1000 MCG PO TABS
1000.0000 ug | ORAL_TABLET | Freq: Every day | ORAL | Status: DC
  Administered 2020-11-19 – 2020-11-24 (×6): 1000 ug via ORAL
  Filled 2020-11-19 (×6): qty 1

## 2020-11-19 MED ORDER — BISACODYL 10 MG RE SUPP: 10.0000 mg | Freq: Every day | RECTAL | Status: DC | PRN

## 2020-11-19 NOTE — Progress Notes (Signed)
  Subjective: 1 Day Post-Op Procedure(s) (LRB): ARTHROPLASTY BIPOLAR HIP (HEMIARTHROPLASTY) (Left) Patient reports pain as mild.   Patient is well but is tachycardic and flushed, has not received home BP medications yet PT and care management to assist with discharge planning. Negative for chest pain and shortness of breath Fever: no Gastrointestinal:Negative for nausea and vomiting  Objective: Vital signs in last 24 hours: Temp:  [97.8 F (36.6 C)-99.5 F (37.5 C)] 99.5 F (37.5 C) (11/28 0800) Pulse Rate:  [99-128] 120 (11/28 0800) Resp:  [9-25] 20 (11/28 0800) BP: (122-179)/(76-109) 155/99 (11/28 0800) SpO2:  [94 %-100 %] 97 % (11/28 0800)  Intake/Output from previous day:  Intake/Output Summary (Last 24 hours) at 11/19/2020 0908 Last data filed at 11/19/2020 0314 Gross per 24 hour  Intake 3818.65 ml  Output 1175 ml  Net 2643.65 ml    Intake/Output this shift: No intake/output data recorded.  Labs: Recent Labs    11/17/20 2217 11/18/20 0415 11/19/20 0605  HGB 16.0* 15.4* 13.6   Recent Labs    11/18/20 0415 11/19/20 0605  WBC 24.6* 25.9*  RBC 4.95 4.38  HCT 41.8 37.8  PLT 328 293   Recent Labs    11/18/20 0415 11/19/20 0605  NA 121* 126*  K 5.1 4.3  CL 88* 95*  CO2 22 21*  BUN 24* 21  CREATININE 1.20* 1.56*  GLUCOSE 168* 148*  CALCIUM 9.3 8.9   No results for input(s): LABPT, INR in the last 72 hours.   EXAM General - Patient is Alert, Appropriate, Oriented and flushed Extremity - ABD soft Sensation intact distally Intact pulses distally Dorsiflexion/Plantar flexion intact Incision: dressing C/D/I No cellulitis present Dressing/Incision - clean, dry, no drainage Motor Function - intact, moving foot and toes well on exam.  Hip abduction pillow was adjusted for comfort  Past Medical History:  Diagnosis Date  . Anxiety disorder   . Dermatitis   . Granulomatosis with polyangiitis ([place]) 03/28/2020  . Heart murmur   . Hypercholesterolemia    . Hypertension   . Lymphadenitis   . Renal disorder   . Skin cancer   . Spontaneous ecchymoses   . Vitamin D deficiency     Assessment/Plan: 1 Day Post-Op Procedure(s) (LRB): ARTHROPLASTY BIPOLAR HIP (HEMIARTHROPLASTY) (Left) Principal Problem:   Closed left hip fracture, initial encounter [facility]) Active Problems:   Essential hypertension   Hyponatremia   Pauci-immune Glomerulonephritis   On long term prednisone therapy   Stage 3b chronic kidney disease ([facility])   Chronic back pain   Leukocytosis   Closed fracture of multiple pubic rami, left, initial encounter ([place])   Closed intertrochanteric fracture of left femur ([facility])  Estimated body mass index is 28.72 kg/m as calculated from the following:   Height as of this encounter: 5' (1.524 m).   Weight as of this encounter: 66.7 kg. Advance diet Up with therapy D/C IV fluids when tolerating po intake.  Labs reviewed this AM, WBC up to 25.9 this AM.  Continue to monitor, trending down from 11/17/20. HR 120, patient just received her home BP medications.  Monitor for improvement. Up with therapy today.   Patient is passing gas, continue to work on a BM.  DVT Prophylaxis - Lovenox, Foot Pumps and TED hose Weight-Bearing as tolerated to Left leg  J. Cameron Proud, PA-C [facility] Orthopaedic Surgery 11/19/2020, 9:08 AM

## 2020-11-19 NOTE — Progress Notes (Signed)
PROGRESS NOTE    Linda Bradley  FOY:[id number] DOB: [birth year] DOA: 11/17/2020 PCP: Lynnell Jude, MD   Brief Narrative:  Linda Bradley is a 79 y.o. 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   She denied falling or making any sudden movement that may have precipitated the pain. States prior to the onset of the pain she was in her usual state of health and felt like she was doing fairly well.  She denies recent illness.  Patient had been on oxygen since she got Covid in January but was gradually weaned off and turned in her oxygen tank a few weeks prior.  She denies chest pain, shortness of breath, palpitations, fever or chills.  Denies dysuria, change in bowel habits, no vomiting.  Has nausea but this has been the same for several months.  Denies headache, visual disturbance, weakness in face or extremities. ED Course: On arrival she was afebrile, BP 168/78 heart rate 98 O2 sat 96% on room air.  She had a CT lumbar spine that showed subacute bilateral sacral insufficiency fractures similar in appearance to previous MRI with no other acute abnormality.  She does have moderate to advanced multilevel degenerative spondylosis and facet arthrosis relatively stable from prior CTs.  Blood work, significant for leukocytosis of 36,000 with hemoglobin 16.  Sodium 122, BUN/creatinine of 32/1.59.  Creatinine was 1.36 three months prior.  Urinalysis showed proteinuria large hemoglobin  and rare bacteria.  Covid and flu test negative.  Patient was treated with prednisone and narcotics in the emergency room without significant improvement.  Hospitalist consulted for admission. EKG: Sinus tachycardia at 100 with no acute ST-T wave changes  Following admission, hip x-ray was done showing " transverse subcapital fracture of the left femoral neck with varus angulation with fractures of the left superior and inferior pubic rami possibly demonstrating healing"  Assessment & Plan:   Principal Problem:   Closed left hip fracture, initial encounter [facility]) Active Problems:   Essential hypertension   Hyponatremia   Pauci-immune Glomerulonephritis   On long term prednisone therapy   Stage 3b chronic kidney disease (HCC)   Chronic back pain   Leukocytosis   Closed fracture of multiple pubic rami, left, initial encounter ([place])   Closed intertrochanteric fracture of left femur (HCC)   Closed left hip fracture, suspect pathologic fracture/Left superior and inferior pubic rami fractures: No fall involved.  Likely pathological fracture most likely due to being on steroids chronically.  Orthopedic on board.  Postoperative day #1 status post left hip unipolar hemiarthroplasty.  Pain controlled.  Management per orthopedics.  PT recommends CIR.  Acute hyponatremia -Sodium 122 upon presentation but improving in 126 today.  She is asymptomatic.  History of Covid: -Patient had Covid in January 2021 has been weaned off of chronic oxygen within the past month -Saturating in the high 90s on room air  Essential hypertension with tachycardia: Patient had some elevated blood pressure after the surgery and overnight as well as some intermittent tachycardia.  Patient takes  multiple medications which include amlodipine, atenolol, clonidine and losartan.  Resume all of them.  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  However, numbers were not that high. -WBC of 36,000 without stigmata of infection upon presentation.  Leukocytosis improving.  Rare to see such high amount of white blood cells just because of low-dose steroids.  Fortunately, her leukocytosis is improving.    Essential hypertension: Controlled. -Continue home amlodipine and atenolol    Pauci-immune Glomerulonephritis, April 2021   On long term prednisone therapy   Stage 3b chronic kidney disease ([place]) -Patient followed by nephrology, last seen 11/13/2020 -Continue prednisone 10 mg daily   DVT prophylaxis: enoxaparin (LOVENOX) injection 40 mg Start: 11/19/20 0800 SCDs Start: 11/18/20 1114 Place TED hose Start: 11/18/20 1114   Code Status: Full Code  Family Communication:  None present at bedside.  Plan of care discussed with patient in length and he verbalized understanding and agreed with it.  Status is: Inpatient  Remains inpatient appropriate because:Inpatient level of care appropriate due to severity of illness   Dispo: The patient is from: Home              Anticipated d/c is to: CIR              Anticipated d/c date is: 1 day              Patient currently is not medically stable to d/c.    Estimated body mass index is 28.72 kg/m as calculated from the following:   Height as of this encounter: 5' (1.524 m).   Weight as of this encounter: 66.7 kg.      Nutritional status:               Consultants:   Orthopedics  Procedures:    as above  Antimicrobials:  Anti-infectives (From admission, onward)   Start     Dose/Rate Route Frequency Ordered Stop   11/18/20 1630  ceFAZolin (ANCEF) IVPB 2g/100 mL premix        2 g 200 mL/hr over 30 Minutes Intravenous Every 6 hours 11/18/20 1629 11/19/20 0436   11/18/20  0910  ceFAZolin (ANCEF) 2-4 GM/100ML-% IVPB       Note to Pharmacy: Register, Karen   : cabinet override      11/18/20 0910 11/18/20 1706   11/18/20 0312  ceFAZolin (ANCEF) IVPB 2g/100 mL premix        2 g 200 mL/hr over 30 Minutes Intravenous 30 min pre-op 11/18/20 0314 11/18/20 0944         Subjective: Patient seen and examined.  She tells me that she could not sleep well last night due to elevated blood pressure.  Now she is feeling better.  No other complaint.  Objective: Vitals:   11/19/20 0410 11/19/20 0800 11/19/20 1010 11/19/20 1108  BP: (!) 163/103 (!) 155/99 (!) 148/91 133/71  Pulse: (!) 125 (!) 120 (!) 107 98  Resp: 20 20    Temp: 98.4 F (36.9 C) 99.5 F (37.5 C)    TempSrc:  Oral    SpO2: 97% 97% 98%   Weight:      Height:        Intake/Output Summary (Last 24 hours) at 11/19/2020 1113 Last data filed  at 11/19/2020 1100 Gross per 24 hour  Intake 2818.65 ml  Output 1575 ml  Net 1243.65 ml   Filed Weights   11/17/20 1814  Weight: 66.7 kg    Examination:  General exam: Appears calm and comfortable  Respiratory system: Clear to auscultation. Respiratory effort normal. Cardiovascular system: S1 & S2 heard, RRR. No JVD, murmurs, rubs, gallops or clicks. No pedal edema. Gastrointestinal system: Abdomen is nondistended, soft and nontender. No organomegaly or masses felt. Normal bowel sounds heard. Central nervous system: Alert and oriented. No focal neurological deficits. Skin: No rashes, lesions or ulcers.  Psychiatry: Judgement and insight appear normal. Mood & affect appropriate.   Data Reviewed: I have personally reviewed following labs and imaging studies  CBC: Recent Labs  Lab 11/17/20 2217 11/18/20 0415 11/19/20 0605  WBC 36.0* 24.6* 25.9*  NEUTROABS 31.0*  --   --   HGB 16.0* 15.4* 13.6  HCT 44.3 41.8 37.8  MCV 85.4 84.4 86.3  PLT 397 328 756   Basic Metabolic Panel: Recent Labs  Lab 11/17/20 2217 11/18/20 0415 11/19/20 0605  NA  122* 121* 126*  K 4.2 5.1 4.3  CL 83* 88* 95*  CO2 25 22 21*  GLUCOSE 170* 168* 148*  BUN 32* 24* 21  CREATININE 1.59* 1.20* 1.56*  CALCIUM 9.9 9.3 8.9   GFR: Estimated Creatinine Clearance: 25.3 mL/min (A) (by C-G formula based on SCr of 1.56 mg/dL (H)). Liver Function Tests: No results for input(s): AST, ALT, ALKPHOS, BILITOT, PROT, ALBUMIN in the last 168 hours. No results for input(s): LIPASE, AMYLASE in the last 168 hours. No results for input(s): AMMONIA in the last 168 hours. Coagulation Profile: No results for input(s): INR, PROTIME in the last 168 hours. Cardiac Enzymes: No results for input(s): CKTOTAL, CKMB, CKMBINDEX, TROPONINI in the last 168 hours. BNP (last 3 results) No results for input(s): PROBNP in the last 8760 hours. HbA1C: No results for input(s): HGBA1C in the last 72 hours. CBG: No results for input(s): GLUCAP in the last 168 hours. Lipid Profile: No results for input(s): CHOL, HDL, LDLCALC, TRIG, CHOLHDL, LDLDIRECT in the last 72 hours. Thyroid Function Tests: No results for input(s): TSH, T4TOTAL, FREET4, T3FREE, THYROIDAB in the last 72 hours. Anemia Panel: No results for input(s): VITAMINB12, FOLATE, FERRITIN, TIBC, IRON, RETICCTPCT in the last 72 hours. Sepsis Labs: No results for input(s): PROCALCITON, LATICACIDVEN in the last 168 hours.  Recent Results (from the past 240 hour(s))  Resp Panel by RT-PCR (Flu A&B, Covid) Nasopharyngeal Swab     Status: None   Collection Time: 11/17/20 10:17 PM   Specimen: Nasopharyngeal Swab; Nasopharyngeal(NP) swabs in vial transport medium  Result Value Ref Range Status   SARS Coronavirus 2 by RT PCR NEGATIVE NEGATIVE Final    Comment: (NOTE) SARS-CoV-2 target nucleic acids are NOT DETECTED.  The SARS-CoV-2 RNA is generally detectable in upper respiratory specimens during the acute phase of infection. The lowest concentration of SARS-CoV-2 viral copies this assay can detect is 138 copies/mL. A negative  result does not preclude SARS-Cov-2 infection and should not be used as the sole basis for treatment or other patient management decisions. A negative result may occur with  improper specimen collection/handling, submission of specimen other than nasopharyngeal swab, presence of viral mutation(s) within the areas targeted by this assay, and inadequate number of viral copies(<138 copies/mL). A negative result must be combined with clinical observations, patient history, and epidemiological information. The expected result is Negative.  Fact Sheet for Patients:  EntrepreneurPulse.com.au  Fact Sheet for Healthcare Providers:  IncredibleEmployment.be  This test is no t yet approved or cleared by the [place] FDA and  has been authorized for detection and/or diagnosis of SARS-CoV-2 by FDA under an Emergency Use Authorization (EUA). This EUA will remain  in effect (meaning this test can be used) for the duration of the COVID-19 declaration under Section 564(b)(1) of the Act, 21 U.S.C.section 360bbb-3(b)(1), unless the authorization is terminated  or revoked sooner.       Influenza A by PCR NEGATIVE NEGATIVE Final   Influenza B by PCR NEGATIVE NEGATIVE Final    Comment: (NOTE) The Xpert Xpress SARS-CoV-2/FLU/RSV plus assay is intended as an aid in the diagnosis of influenza from Nasopharyngeal swab specimens and should not be used as a sole basis for treatment. Nasal washings and aspirates are unacceptable for Xpert Xpress SARS-CoV-2/FLU/RSV testing.  Fact Sheet for Patients: EntrepreneurPulse.com.au  Fact Sheet for Healthcare Providers: IncredibleEmployment.be  This test is not yet approved or cleared by the [place] FDA and has been authorized for detection and/or diagnosis of SARS-CoV-2 by FDA under an Emergency Use Authorization (EUA). This EUA will remain in effect (meaning this test can be used)  for the duration of the COVID-19 declaration under Section 564(b)(1) of the Act, 21 U.S.C. section 360bbb-3(b)(1), unless the authorization is terminated or revoked.  Performed at [facility], [address], [place], [place] [address]       Radiology Studies: CT Lumbar Spine Wo Contrast  Result Date: 11/18/2020 CLINICAL DATA:  Initial evaluation for left lower back pain radiating into the left lower extremity. EXAM: CT LUMBAR SPINE WITHOUT CONTRAST TECHNIQUE: Multidetector CT imaging of the lumbar spine was performed without intravenous contrast administration. Multiplanar CT image reconstructions were also generated. COMPARISON:  Prior MRI from 08/02/2020. FINDINGS: Segmentation: Standard. Lowest well-formed disc space labeled the L5-S1 level. Alignment: Severe levoscoliosis.  With apex at L1-2. No listhesis. Vertebrae: Vertebral body height maintained without acute or interval fracture. Subacute bilateral sacral insufficiency fractures again seen, with fracture line extending through the S2 segment, relatively similar to previous MRI. No other new or interval fracture. No discrete lytic or blastic osseous lesions. Paraspinal and other soft tissues: Paraspinous soft tissues demonstrate no acute finding. Moderate aorto bi-iliac atherosclerotic disease. Moderate distension of the partially visualized stomach. Disc levels: T11-12: Disc desiccation without significant disc bulge. Right-sided reactive endplate change. Moderate right with mild left facet hypertrophy. No spinal stenosis. Mild to moderate bilateral foraminal narrowing. T12-L1: Right-sided reactive endplate spurring without significant disc bulge. Mild right-sided facet hypertrophy. No significant spinal stenosis. Foramina appear patent. L1-2: Mild diffuse disc bulge, eccentric to the left. Superimposed shallow central disc protrusion mildly indents the ventral thecal sac. Moderate right with mild left facet hypertrophy. No  significant spinal stenosis. Foramina remain patent. Appearance is stable. L2-3: Diffuse disc bulge with disc desiccation. Right-sided reactive endplate spurring. Moderate right worse than left facet arthrosis. Mild spinal stenosis with mild to moderate right lateral recess narrowing. Moderate right foraminal narrowing. Left neural foramina remains patent. L3-4: Mild diffuse disc bulge with reactive endplate spurring, greater on the left. Moderate facet hypertrophy. Resultant moderate left lateral recess stenosis. Moderate left with mild right L3 foraminal narrowing. L4-5: Mild disc bulge. Severe left with moderate right facet hypertrophy. Mild narrowing of the left lateral recess. Mild to moderate left foraminal narrowing. Right neural foramen remains patent. L5-S1: Minimal annular disc bulge. Severe left with moderate right facet hypertrophy. No spinal stenosis. Foramina remain  patent. IMPRESSION: 1. Subacute bilateral sacral insufficiency fractures, relatively similar in appearance to previous MRI. 2. No other new acute abnormality within the lumbar spine. 3. 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 Appearance is relatively stable from previous. Please see above report for a full description of these findings. 4. Aortic Atherosclerosis (ICD10-I70.0). Electronically Signed   By: Jeannine Boga M.D.   On: 11/18/2020 00:12   DG Chest Port 1 View  Result Date: 11/18/2020 CLINICAL DATA:  Progressive worsening of back pain. EXAM: PORTABLE CHEST 1 VIEW COMPARISON:  04/11/2020 FINDINGS: Heart size and pulmonary vascularity are normal. Lungs are clear. Thoracic scoliosis convex towards the right with degenerative changes. No pleural effusions. No pneumothorax. Calcification of the aorta. IMPRESSION: No active disease. Electronically Signed   By: Lucienne Capers M.D.   On: 11/18/2020 02:20   DG HIP UNILAT W OR W/O PELVIS 2-3  VIEWS LEFT  Result Date: 11/18/2020 CLINICAL DATA:  Status post total hip replacement EXAM: DG HIP (WITH OR WITHOUT PELVIS) 2-3V LEFT COMPARISON:  November 18, 2020 study obtained earlier in the day FINDINGS: Frontal mid to lower pelvis and hips as well as lateral left hip images were obtained. There is a total hip replacement on the left with prosthetic components well-seated. No fracture or dislocation in the area of surgery. Fractures of the left superior pubic ramus and ischium again noted without change. There is mild narrowing of the right hip joint. IMPRESSION: Total hip replacement on the left with prosthetic components well-seated. No fracture or dislocation in the area of hip replacement. Fractures of the left ischium and left superior pubic ramus are unchanged compared to earlier in the day. Mild narrowing right hip joint. No new fracture appreciable. Electronically Signed   By: Lowella Grip III M.D.   On: 11/18/2020 12:42   DG HIP UNILAT WITH PELVIS 2-3 VIEWS LEFT  Result Date: 11/18/2020 CLINICAL DATA:  Worsening of back pain today. EXAM: DG HIP (WITH OR WITHOUT PELVIS) 2-3V LEFT COMPARISON:  None. FINDINGS: Transverse subcapital fracture of the left femoral neck with varus angulation. Fractures are also present of the left superior and inferior pubic rami, possibly with healing. Degenerative changes in the lower lumbar spine and both hips. IMPRESSION: Transverse subcapital fracture of the left femoral neck with varus angulation. Fractures of the left superior and inferior pubic rami possibly demonstrating healing. Electronically Signed   By: Lucienne Capers M.D.   On: 11/18/2020 02:22    Scheduled Meds: . acetaminophen  500 mg Oral Q6H  . amLODipine  5 mg Oral Daily  . atenolol  12.5 mg Oral Daily  . cloNIDine  0.2 mg Oral TID  . docusate sodium  100 mg Oral BID  . enoxaparin (LOVENOX) injection  40 mg Subcutaneous Q24H  . [START ON 11/21/2020] furosemide  20 mg Oral Once per  day on Tue Thu Sat  . losartan  100 mg Oral Daily  . predniSONE  10 mg Oral Daily  . sodium chloride  1 g Oral BID WC  . spironolactone  25 mg Oral Daily  . cyanocobalamin  1,000 mcg Oral Daily   Continuous Infusions: . sodium chloride 75 mL/hr at 11/18/20 2204     LOS: 1 day   Time spent: 30 minutes   Darliss Cheney, MD [facility]  11/19/2020, 11:13 AM   To contact the attending provider between 7A-7P or the covering provider during after hours 7P-7A, please log into the  web site [email or web address].

## 2020-11-19 NOTE — Progress Notes (Signed)
Pts BP and HR elevated. MD notified. Pt states she takes BP meds at home that have not been started here. MD also notified.

## 2020-11-19 NOTE — Progress Notes (Signed)
   11/18/20 2354  Assess: MEWS Score  Temp 99.1 F (37.3 C)  BP (!) 153/95  Pulse Rate (!) 120  Resp 17  SpO2 97 %  Assess: MEWS Score  MEWS Temp 0  MEWS Systolic 0  MEWS Pulse 2  MEWS RR 0  MEWS LOC 0  MEWS Score 2  MEWS Score Color Yellow  Assess: if the MEWS score is Yellow or Red  Were vital signs taken at a resting state? Yes  Focused Assessment No change from prior assessment  Early Detection of Sepsis Score *See Row Information* Medium  MEWS guidelines implemented *See Row Information* Yes  Treat  MEWS Interventions Administered scheduled meds/treatments  Pain Scale 0-10  Pain Score 0  Take Vital Signs  Increase Vital Sign Frequency  Yellow: Q 2hr X 2 then Q 4hr X 2, if remains yellow, continue Q 4hrs  Escalate  MEWS: Escalate Yellow: discuss with charge nurse/RN and consider discussing with provider and RRT  Notify: Charge Nurse/RN  Name of Charge Nurse/RN Notified Deborah, RN  Date Charge Nurse/RN Notified 11/18/20  Time Charge Nurse/RN Notified 0000  Document  Patient Outcome Other (Comment) (pt stable )

## 2020-11-19 NOTE — Progress Notes (Signed)
Inpatient Rehab Admissions Coordinator Note:   Per therapy recommendations, pt was screened for CIR candidacy by Clemens Catholic, [place] CCC-SLP. At this time, Pt. Appears to have functional decline and is a good candidate for CIR. Will place order for rehab consult per protocol. Pt.'s payor source may not approve CIR stay and often takes 3-4 days to give a decision. Recommend CM also pursue SNF placements as a back-up. Please contact me with questions.   Clemens Catholic, [place], [place] Admissions Coordinator  [phone] ([place]) [phone] (office)

## 2020-11-19 NOTE — Evaluation (Signed)
Physical Therapy Evaluation Patient Details Name: Linda [place] MRN: [id number] DOB: [birth year] Today's Date: 11/19/2020   History of Present Illness  Pt is a 79yo F admitted to [facility] on 11/17/20 for acute pain and disability. Pt began to experience increasing LLE pain that became dibilitating to the point where she was unable to stand or ambulate with AD. Pt with significant PMH including: anxiety, Pauci-immune Glomerulonephritis, HTN, CKD (stage IIIb), DDD, formainal stenosis, hx back sx, and cLBP. Imaging significant for: severe levoscoliosis with mod-advanced multilevel degenerative spondylosis and facet arthrosis, subacute bil sacral insufficiency fx, transverse subcapital fx of the L femoral neck with varus angulation, with fx of the L superior/inferior pubic rami possibly demonstrating healing. Pt s/p L hip unipolar hemiarthroplasty on 11/18/20 by Dr. Roland Rack and is WBAT on LLE.    Clinical Impression  Pt is a 79 year old F admitted to hospital on 11/17/20 for L hip fx; pt s/p L hip unipolar hemiarthroplasty on 11/18/20 by Dr. Roland Rack and is WBAT on LLE. PT implemented posterior hip precautions for pain management. Pt recently finished HHPT services prior to hospital admission. At baseline, pt was mod I with ADL's and household ambulation, but required assistance for most IADL's from son/grandson. Pt presents with generalized weakness and pain due to acuity of sx, decreased balance, decreased activity tolerance, decreased cardiopulmonary tolerance to exercise, and impaired insight into deficits, resulting in impaired functional mobility from baseline. Due to deficits, pt required max assist for bed mobility, mod assist +2 for STS transfers from elevated bed height with RW, and min assist +2 for short distance ambulation at EOB with RW. Further mobility limited due to increased pain and tachycardia, with HR ranging from 107-121 bpm during session. Pt unable to clear BLE from ground to take steps during  mobility which increase her risk of falls. Deficits limit the pt's ability to safely and independently perform ADL's, transfer, and ambulate. Pt will benefit from acute skilled PT services to address deficits for return to baseline function. Pt will also benefit from OT consult to address ADL deficits. At this time, PT recommends CIR at DC to address deficits and improve overall safety with functional mobility prior to return home. Pt disheartened with PT recommendation, as she states she wants to go home and will do better at home. However, pt requires increased assistance for limited mobility and would require 24/7 care, but has decreased caregiver support during the day as her son works full time. Therefore, pt unsafe to DC home at this time.     Follow Up Recommendations CIR;Supervision/Assistance - 24 hour    Equipment Recommendations  3in1 (PT)    Recommendations for Other Services Rehab consult;OT consult     Precautions / Restrictions Precautions Precautions: Posterior Hip;Fall Precaution Booklet Issued: No Precaution Comments: posterior hip precautions implemented for pain management. Restrictions Weight Bearing Restrictions: Yes LLE Weight Bearing: Weight bearing as tolerated Other Position/Activity Restrictions: hip abd brace donned in bed      Mobility  Bed Mobility Overal bed mobility: Needs Assistance Bed Mobility: Supine to Sit;Sit to Supine     Supine to sit: Max assist;+2 for physical assistance;HOB elevated Sit to supine: Max assist;+2 for physical assistance   General bed mobility comments: Max assist +2 for trunk/BLE facilitation to perform supine<>sit transfer with max cues for sequencing and pain management techniques. Increased time/effort required due to pt guarding and increased pain.    Transfers Overall transfer level: Needs assistance Equipment used: Rolling walker (2 wheeled) Transfers:  Sit to/from Stand Sit to Stand: Mod assist;+2 physical  assistance;From elevated surface         General transfer comment: Mod assist +2 to stand from elevated bed height with RW. Max multimodal cues provided for hand/LLE placement to ensure safety with mobility and for hip ext to acheive full standing. Increased time/effort required due to guarding and increased pain.  Ambulation/Gait Ambulation/Gait assistance: Min assist;+2 physical assistance Gait Distance (Feet): 2 Feet (6 short steps at EOB towards [facility]) Assistive device: Rolling walker (2 wheeled)   Gait velocity: decreased   General Gait Details: Pt required min assist +2 to take 6 small steps at EOB towards HOB with RW. Pt demonstrated decreased step length, narrow BOS, tremoring, and guarded movement. Pt unable to clear BLE from ground to take steps, demonstrating lateral scooting of feet across the ground. Increased time/effort required due to guarding and increased pain. Pt required max multimodal cues for sequencing and RW negotiation.  Stairs            Wheelchair Mobility    Modified Rankin (Stroke Patients Only)       Balance Overall balance assessment: Needs assistance Sitting-balance support: Bilateral upper extremity supported;Feet supported Sitting balance-Leahy Scale: Fair Sitting balance - Comments: Fair seated balance at EOB with BUE/BLE support; pt with c/o dizziness, vitals WNL   Standing balance support: Bilateral upper extremity supported;During functional activity   Standing balance comment: Poor standing balance in RW with BUE support, requiring min assist +2 for maintenance of balance                             Pertinent Vitals/Pain Pain Assessment: 0-10 Pain Score: 7  Pain Location: L hip Pain Descriptors / Indicators: Crying;Grimacing;Guarding;Moaning Pain Intervention(s): Limited activity within patient's tolerance;Monitored during session;Repositioned    Home Living Family/patient expects to be discharged to:: Private  residence Living Arrangements: Children (Son and grandson (18yo)) Available Help at Discharge: Family;Available PRN/intermittently (Son works 8-4 during the week and works night job 3x/wk as Chief Operating Officer. Yolanda Bonine is in high school and gone during the day.) Type of Home: House Home Access: Stairs to enter Entrance Stairs-Rails: Right Entrance Stairs-Number of Steps: 5 Home Layout: One level Home Equipment: Walker - 2 wheels;Cane - single point;Shower seat      Prior Function Level of Independence: Needs assistance   Gait / Transfers Assistance Needed: Pt previously active with HHPT after back sx 29mo ago. Pt had just finished HHPT and had been ambulating without RW for ~1 week prior to hospitalization.  ADL's / Homemaking Assistance Needed: Pt reports being mod I/Ind with ADL's, but splits IADL's with son/grandson. Pt reports that her son will make her a sandwich while he's gone at work, and she "just needs to walk to go get it."        Hand Dominance        Extremity/Trunk Assessment   Upper Extremity Assessment Upper Extremity Assessment: Generalized weakness    Lower Extremity Assessment Lower Extremity Assessment: Generalized weakness    Cervical / Trunk Assessment Cervical / Trunk Assessment: Normal  Communication   Communication: No difficulties  Cognition Arousal/Alertness: Awake/alert Behavior During Therapy: WFL for tasks assessed/performed Overall Cognitive Status: Within Functional Limits for tasks assessed                                 General Comments: A&O x4  General Comments      Exercises Other Exercises Other Exercises: Pt educated regarding: pain management (post hip precautions, PLB, relaxation), LLE WBAT precautions, wear time of hip abd pillow, and DC recommendations based on pt current presentation. Other Exercises: Pt able to perform supine<>sit transfer with max assist +2, STS with mod assist +2 with RW, and take steps at EOB  with min assist +2 in RW.   Assessment/Plan    PT Assessment Patient needs continued PT services  PT Problem List Decreased strength;Decreased range of motion;Decreased activity tolerance;Decreased balance;Decreased mobility;Decreased safety awareness;Decreased knowledge of precautions;Cardiopulmonary status limiting activity;Pain       PT Treatment Interventions DME instruction;Gait training;Stair training;Functional mobility training;Therapeutic activities;Therapeutic exercise;Balance training;Neuromuscular re-education;Patient/family education    PT Goals (Current goals can be found in the Care Plan section)  Acute Rehab PT Goals Patient Stated Goal: to go home PT Goal Formulation: With patient Time For Goal Achievement: 12/03/20 Potential to Achieve Goals: Fair    Frequency BID   Barriers to discharge Decreased caregiver support      Co-evaluation               AM-PAC PT "6 Clicks" Mobility  Outcome Measure Help needed turning from your back to your side while in a flat bed without using bedrails?: A Lot Help needed moving from lying on your back to sitting on the side of a flat bed without using bedrails?: A Lot Help needed moving to and from a bed to a chair (including a wheelchair)?: A Little Help needed standing up from a chair using your arms (e.g., wheelchair or bedside chair)?: A Lot Help needed to walk in hospital room?: A Little Help needed climbing 3-5 steps with a railing? : Total 6 Click Score: 13    End of Session Equipment Utilized During Treatment: Gait belt Activity Tolerance: Patient limited by pain Patient left: in bed;with call bell/phone within reach;with bed alarm set (hip abd pillow in place) Nurse Communication: Mobility status PT Visit Diagnosis: Unsteadiness on feet (R26.81);Difficulty in walking, not elsewhere classified (R26.2);Muscle weakness (generalized) (M62.81);Pain Pain - Right/Left: Left Pain - part of body: Hip    Time:  9326-7124 PT Time Calculation (min) (ACUTE ONLY): 40 min   Charges:   PT Evaluation $PT Eval Moderate Complexity: 1 Mod PT Treatments $Therapeutic Activity: 8-22 mins        Herminio Commons, PT, DPT 12:29 PM,11/19/20

## 2020-11-19 NOTE — Plan of Care (Signed)

## 2020-11-20 ENCOUNTER — Encounter: Payer: Self-pay | Admitting: Surgery

## 2020-11-20 DIAGNOSIS — S72002A Fracture of unspecified part of neck of left femur, initial encounter for closed fracture: Secondary | ICD-10-CM | POA: Diagnosis not present

## 2020-11-20 LAB — BASIC METABOLIC PANEL
Anion gap: 7 (ref 5–15)
BUN: 24 mg/dL — ABNORMAL HIGH (ref 8–23)
CO2: 26 mmol/L (ref 22–32)
Calcium: 8.9 mg/dL (ref 8.9–10.3)
Chloride: 102 mmol/L (ref 98–111)
Creatinine, Ser: 1.35 mg/dL — ABNORMAL HIGH (ref 0.44–1.00)
GFR, Estimated: 40 mL/min — ABNORMAL LOW (ref >60)
Glucose, Bld: 101 mg/dL — ABNORMAL HIGH (ref 70–99)
Potassium: 5.3 mmol/L — ABNORMAL HIGH (ref 3.5–5.1)
Sodium: 135 mmol/L (ref 135–145)

## 2020-11-20 LAB — CBC
HCT: 35.3 % — ABNORMAL LOW (ref 36.0–46.0)
Hemoglobin: 12.1 g/dL (ref 12.0–15.0)
MCH: 31.3 pg (ref 26.0–34.0)
MCHC: 34.3 g/dL (ref 30.0–36.0)
MCV: 91.5 fL (ref 80.0–100.0)
Platelets: 263 10*3/uL (ref 150–400)
RBC: 3.86 MIL/uL — ABNORMAL LOW (ref 3.87–5.11)
RDW: 13.9 % (ref 11.5–15.5)
WBC: 18.1 10*3/uL — ABNORMAL HIGH (ref 4.0–10.5)
nRBC: 0 % (ref 0.0–0.2)

## 2020-11-20 MED ORDER — ATENOLOL 25 MG PO TABS
12.5000 mg | ORAL_TABLET | Freq: Two times a day (BID) | ORAL | Status: DC
  Administered 2020-11-20 – 2020-11-24 (×8): 12.5 mg via ORAL
  Filled 2020-11-20 (×8): qty 1

## 2020-11-20 NOTE — Progress Notes (Signed)
  Subjective: 2 Days Post-Op Procedure(s) (LRB): ARTHROPLASTY BIPOLAR HIP (HEMIARTHROPLASTY) (Left) Patient reports pain as mild.   Patient is well, tachycardia has resolved with home meds, HR currently 92 PT and care management to assist with discharge planning.  Current recommendation is for CIR. Negative for chest pain and shortness of breath Fever: no Gastrointestinal:Negative for nausea and vomiting  Objective: Vital signs in last 24 hours: Temp:  [97.8 F (36.6 C)-99.5 F (37.5 C)] 98.6 F (37 C) (11/29 0742) Pulse Rate:  [84-120] 103 (11/29 0742) Resp:  [16-20] 16 (11/29 0742) BP: (124-157)/(67-99) 157/85 (11/29 0742) SpO2:  [97 %-99 %] 98 % (11/29 0742)  Intake/Output from previous day:  Intake/Output Summary (Last 24 hours) at 11/20/2020 0744 Last data filed at 11/20/2020 0516 Gross per 24 hour  Intake --  Output 1700 ml  Net -1700 ml    Intake/Output this shift: No intake/output data recorded.  Labs: Recent Labs    11/17/20 2217 11/18/20 0415 11/19/20 0605 11/20/20 0546  HGB 16.0* 15.4* 13.6 12.1   Recent Labs    11/19/20 0605 11/20/20 0546  WBC 25.9* 18.1*  RBC 4.38 3.86*  HCT 37.8 35.3*  PLT 293 263   Recent Labs    11/19/20 0605 11/20/20 0546  NA 126* 135  K 4.3 5.3*  CL 95* 102  CO2 21* 26  BUN 21 24*  CREATININE 1.56* 1.35*  GLUCOSE 148* 101*  CALCIUM 8.9 8.9   No results for input(s): LABPT, INR in the last 72 hours.   EXAM General - Patient is Alert, Appropriate and Oriented Extremity - ABD soft Sensation intact distally Intact pulses distally Dorsiflexion/Plantar flexion intact Incision: scant drainage No cellulitis present Dressing/Incision - Minimal blood tinged drainage this morning. Motor Function - intact, moving foot and toes well on exam.    Past Medical History:  Diagnosis Date  . Anxiety disorder   . Dermatitis   . 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 Heart murmur   . Hypercholesterolemia    . Hypertension   . Lymphadenitis   . Renal disorder   . Skin cancer   . Spontaneous ecchymoses   . Vitamin D deficiency     Assessment/Plan: 2 Days Post-Op Procedure(s) (LRB): ARTHROPLASTY BIPOLAR HIP (HEMIARTHROPLASTY) (Left) Principal Problem:   Closed left hip fracture, initial encounter [facility]) Active Problems:   Essential hypertension   Hyponatremia   Pauci-immune Glomerulonephritis   On long term prednisone therapy   Stage 3b chronic kidney disease (HCC)   Chronic back pain   Leukocytosis   Closed fracture of multiple pubic rami, left, initial encounter ([place])   Closed intertrochanteric fracture of left femur (HCC)  Estimated body mass index is 28.72 kg/m as calculated from the following:   Height as of this encounter: 5' (1.524 m).   Weight as of this encounter: 66.7 kg. Up with therapy   Labs reviewed this AM, WBC continues to trend down, 18.1 this AM. Low grade fever of 99.5 yesterday. HR improved with home meds, continue to monitor. Up with therapy today.  Current recommendation is for CIR.  May take a few days to get approval, also discussed possible rehab options. Patient is passing gas, continue to work on a BM.  DVT Prophylaxis - Lovenox, Foot Pumps and TED hose Weight-Bearing as tolerated to Left leg  J. Cameron Proud, PA-C [facility] 11/20/2020, 7:44 AM

## 2020-11-20 NOTE — Progress Notes (Signed)
Inpatient Rehabilitation Admissions Coordinator  Inpatient rehab consult received to assess for candidacy for Cir admit at [facility] in [place]. I spoke with patient by phone and she prefers local SNF rehab in [place] or [place] area to be closer to family . I have notified TOC, Sandra, SW and Acute team. We will sign off at this time.  Danne Baxter, RN, MSN Rehab Admissions Coordinator [phone] 11/20/2020 12:23 PM

## 2020-11-20 NOTE — NC FL2 (Signed)
[place] LEVEL OF CARE SCREENING TOOL     IDENTIFICATION  Patient Name: Linda [place] Birthdate: [birth year] Sex: female Admission Date (Current Location): 11/17/2020  [place] and [place] Number:  Engineering geologist and Address:  [facility], [address], [place], [place] [address]      Provider Number:    Attending Physician Name and Address:  Darliss Cheney, MD  Relative Name and Phone Number:  Curry, [place] [facility]) [phone]    Current Level of Care: Hospital Recommended Level of Care: [place] Prior Approval Number:    Date Approved/Denied:   PASRR Number: pending  Discharge Plan: SNF    Current Diagnoses: Patient Active Problem List   Diagnosis Date Noted  . On long term prednisone therapy 11/18/2020  . Stage 3b chronic kidney disease ([place]) 11/18/2020  . Chronic back pain 11/18/2020  . Leukocytosis 11/18/2020  . Closed left hip fracture, initial encounter ([place]) 11/18/2020  . Closed fracture of multiple pubic rami, left, initial encounter ([place]) 11/18/2020  . Closed intertrochanteric fracture of left femur ([place]) 11/18/2020  . Melena   . Anemia, posthemorrhagic, acute   . Acute on chronic respiratory failure with hypoxia ([place]) 04/11/2020  . CKD stage 4 secondary to hypertension ([place]) 04/11/2020  . Interstitial lung disease ([place]) 04/11/2020  . History of anemia due to chronic kidney disease 04/11/2020  . Hypertensive urgency 04/11/2020  . 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 Pauci-immune Glomerulonephritis   . Acute congestive heart failure ([place])   . AKI (acute kidney injury) ([place])   . Anemia of chronic disease   . Dyspnea 03/16/2020  . Hypokalemia 03/14/2016  . Hyponatremia 03/14/2016  . Aortic stenosis, mild 05/26/2015  . Abnormal EKG 04/14/2015  . Dyspnea on exertion 04/14/2015  . Essential hypertension 04/14/2015    Orientation RESPIRATION BLADDER Height & Weight      Self, Time, Place    Continent, External catheter Weight: 147 lb 0.8 oz (66.7 kg) Height:  5' (152.4 cm)  BEHAVIORAL SYMPTOMS/MOOD NEUROLOGICAL BOWEL NUTRITION STATUS      Continent    AMBULATORY STATUS COMMUNICATION OF NEEDS Skin   Extensive Assist Verbally Surgical wounds                       Personal Care Assistance Level of Assistance  Bathing, Feeding, Dressing, Total care Bathing Assistance: Limited assistance Feeding assistance: Independent Dressing Assistance: Limited assistance Total Care Assistance: Maximum assistance   Functional Limitations Info             SPECIAL CARE FACTORS FREQUENCY  PT (By licensed PT), OT (By licensed OT)     PT Frequency: 5 X weekly OT Frequency: 5 X Weekly            Contractures      Additional Factors Info  Code Status, Allergies Code Status Info: Full Allergies Info: coreg, statins, chlorthalidone, amlodipine           Current Medications (11/20/2020):  This is the current hospital active medication list Current Facility-Administered Medications  Medication Dose Route Frequency Provider Last Rate Last Admin  . 0.9 %  sodium chloride infusion   Intravenous Continuous Poggi, Marshall Cork, MD 75 mL/hr at 11/18/20 2204 New Bag at 11/18/20 2204  . acetaminophen (TYLENOL) tablet 325-650 mg  325-650 mg Oral Q6H PRN Poggi, Marshall Cork, MD      . amLODipine (NORVASC) tablet 5 mg  5 mg Oral Daily Pahwani, Ravi,  MD   5 mg at 11/19/20 2041  . atenolol (TENORMIN) tablet 12.5 mg  12.5 mg Oral BID Darliss Cheney, MD      . bisacodyl (DULCOLAX) suppository 10 mg  10 mg Rectal Daily PRN Poggi, Marshall Cork, MD      . clonazePAM Bobbye [place]) tablet 0.5 mg  0.5 mg Oral TID PRN Corky Mull, MD   0.5 mg at 11/20/20 1811  . cloNIDine (CATAPRES) tablet 0.2 mg  0.2 mg Oral TID Darliss Cheney, MD   0.2 mg at 11/20/20 1707  . diphenhydrAMINE (BENADRYL) 12.5 MG/5ML elixir 12.5-25 mg  12.5-25 mg Oral Q4H PRN Poggi, Marshall Cork, MD      . docusate sodium (COLACE)  capsule 100 mg  100 mg Oral BID Corky Mull, MD   100 mg at 11/20/20 0943  . enoxaparin (LOVENOX) injection 40 mg  40 mg Subcutaneous Q24H Poggi, Marshall Cork, MD   40 mg at 11/20/20 0948  . [START ON 11/21/2020] furosemide (LASIX) tablet 20 mg  20 mg Oral Once per day on Tue Thu Sat Darliss Cheney, MD      . HYDROcodone-acetaminophen (NORCO/VICODIN) 5-325 MG per tablet 1-2 tablet  1-2 tablet Oral Q6H PRN Poggi, Marshall Cork, MD   1 tablet at 11/20/20 0943  . losartan (COZAAR) tablet 100 mg  100 mg Oral Daily Darliss Cheney, MD   100 mg at 11/20/20 0943  . magnesium hydroxide (MILK OF MAGNESIA) suspension 30 mL  30 mL Oral Daily PRN Poggi, Marshall Cork, MD      . methocarbamol (ROBAXIN) tablet 500 mg  500 mg Oral Q6H PRN Poggi, Marshall Cork, MD      . metoCLOPramide (REGLAN) tablet 5-10 mg  5-10 mg Oral Q8H PRN Poggi, Marshall Cork, MD       Or  . metoCLOPramide (REGLAN) injection 5-10 mg  5-10 mg Intravenous Q8H PRN Poggi, Marshall Cork, MD      . morphine 2 MG/ML injection 0.5-1 mg  0.5-1 mg Intravenous Q2H PRN Poggi, Marshall Cork, MD      . ondansetron (ZOFRAN) tablet 4 mg  4 mg Oral Q6H PRN Poggi, Marshall Cork, MD       Or  . ondansetron (ZOFRAN) injection 4 mg  4 mg Intravenous Q6H PRN Poggi, Marshall Cork, MD      . predniSONE (DELTASONE) tablet 10 mg  10 mg Oral Daily Darliss Cheney, MD   10 mg at 11/20/20 0943  . sodium chloride tablet 1 g  1 g Oral BID WC Poggi, Marshall Cork, MD   1 g at 11/20/20 1707  . sodium phosphate (FLEET) 7-19 GM/118ML enema 1 enema  1 enema Rectal Once PRN Poggi, Marshall Cork, MD      . spironolactone (ALDACTONE) tablet 25 mg  25 mg Oral Daily Darliss Cheney, MD   25 mg at 11/20/20 0943  . traMADol (ULTRAM) tablet 50 mg  50 mg Oral Q6H PRN Poggi, Marshall Cork, MD      . vitamin B-12 (CYANOCOBALAMIN) tablet 1,000 mcg  1,000 mcg Oral Daily Darliss Cheney, MD   1,000 mcg at 11/20/20 5916     Discharge Medications: Please see discharge summary for a list of discharge medications.  Relevant Imaging Results:  Relevant Lab  Results:   Additional Information SS # [id number]  Meriel Flavors, LCSW

## 2020-11-20 NOTE — Progress Notes (Signed)
OT Cancellation Note  Patient Details Name: Linda [place] MRN: [id number] DOB: 1941-07-01   Cancelled Treatment:    Reason Eval/Treat Not Completed: Medical issues which prohibited therapy. Consult received, chart reviewed, pt noted with K+ of 5.3, outside guidelines for participation in therapy. Will re-attempt at later date/time as medically appropriate.   Jeni Salles, MPH, MS, OTR/L ascom [phone] 11/20/20, 9:22 AM

## 2020-11-20 NOTE — Progress Notes (Signed)
PT Cancellation Note  Patient Details Name: Linda [place] MRN: [id number] DOB: 06-22-1941   Cancelled Treatment:    Reason Eval/Treat Not Completed: Other (comment).  Pt currently receiving medication from nursing and would prefer to wait for medicine to work. Will check back in before lunch as appropriate.     Gwenlyn Saran, PT, DPT 11/20/20, 9:49 AM

## 2020-11-20 NOTE — Progress Notes (Signed)
PROGRESS NOTE    Linda Bradley  HBZ:[id number] DOB: 1941/10/15 DOA: 11/17/2020 PCP: Lynnell Jude, MD   Brief Narrative:  Linda Bradley is a 79 y.o. 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   She denied falling or making any sudden movement that may have precipitated the pain. States prior to the onset of the pain she was in her usual state of health and felt like she was doing fairly well.  On arrival she was afebrile, BP 168/78 heart rate 98 O2 sat 96% on room air.  She had a CT lumbar spine that showed subacute bilateral sacral insufficiency fractures similar in appearance to previous MRI with no other acute abnormality.  She does have moderate to advanced multilevel degenerative spondylosis and facet arthrosis relatively stable from prior CTs.  Blood work, significant for leukocytosis of 36,000 with hemoglobin 16.  Sodium 122. Covid and flu test negative.  Patient was treated with prednisone and narcotics in the emergency room without significant improvement.  Hospitalist consulted for admission. Following admission, hip x-ray was done showing " transverse subcapital fracture of the left femoral neck with varus angulation with fractures of the left superior and inferior pubic rami possibly demonstrating healing.  Orthopedics were consulted.  Patient underwent left hip unipolar hemiarthroplasty on 11/18/2020.  Did well postop.  Seen by PT OT who recommended  CIR.  Assessment & Plan:   Principal Problem:   Closed left hip fracture, initial encounter [facility]) Active Problems:   Essential hypertension   Hyponatremia   Pauci-immune Glomerulonephritis   On long term prednisone therapy   Stage 3b chronic kidney disease (HCC)   Chronic back pain   Leukocytosis   Closed fracture of multiple pubic rami, left, initial encounter ([place])   Closed intertrochanteric fracture of left femur (HCC)   Closed left hip fracture, suspect pathologic fracture/Left superior and inferior pubic rami fractures: No fall involved. fracture most likely due to being on steroids chronically.  Orthopedic on board.  Postoperative day #2 status post left hip unipolar hemiarthroplasty.  Pain controlled.  Management per orthopedics.  PT recommends CIR but according to notes, her insurance company might not approve that and takes 3 to 4 days.  CIR recommends pursuing SNF as a backup.  TOC on board.  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  She is asymptomatic.  CKD stage IIIb: At baseline.  Monitor.  History of Covid: -Patient had Covid in January 2021 has been weaned off of chronic oxygen within the past month -Saturating in the high 90s on room air  Essential hypertension with tachycardia: Controlled.  Continue amlodipine, atenolol, clonidine and losartan.  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  However, numbers were not that high. -WBC of 36,000  without stigmata of infection upon presentation.  Leukocytosis improving.    Pauci-immune Glomerulonephritis, April 2021   On long term prednisone therapy   Stage 3b chronic kidney disease  ([place]) -Patient followed by nephrology, last seen 11/13/2020 -Continue prednisone 10 mg daily  Hyper kalemia: 5.3.  Hopefully will resolve on its own.  Repeat labs in the morning.   DVT prophylaxis: enoxaparin (LOVENOX) injection 40 mg Start: 11/19/20 0800 SCDs Start: 11/18/20 1114 Place TED hose Start: 11/18/20 1114   Code Status: Full Code  Family Communication:  None present at bedside.  Plan of care discussed with patient in length and he verbalized understanding and agreed with it.  Status is: Inpatient  Remains inpatient appropriate because:Inpatient level of care appropriate due to severity of illness   Dispo: The patient is from: Home              Anticipated d/c is to: CIR vs SNF              Anticipated d/c date is: 1 day              Patient currently is not medically stable to d/c.    Estimated body mass index is 28.72 kg/m as calculated from the following:   Height as of this encounter: 5' (1.524 m).   Weight as of this encounter: 66.7 kg.      Nutritional status:               Consultants:   Orthopedics  Procedures:    as above  Antimicrobials:  Anti-infectives (From admission, onward)   Start     Dose/Rate Route Frequency Ordered Stop   11/18/20 1630  ceFAZolin (ANCEF) IVPB 2g/100 mL premix        2 g 200 mL/hr over 30 Minutes Intravenous Every 6 hours 11/18/20 1629 11/19/20 0436   11/18/20 0910  ceFAZolin (ANCEF) 2-4 GM/100ML-% IVPB       Note to Pharmacy: Register, Karen   : cabinet override      11/18/20 0910 11/18/20 1706   11/18/20 0312  ceFAZolin (ANCEF) IVPB 2g/100 mL premix        2 g 200 mL/hr over 30 Minutes Intravenous 30 min pre-op 11/18/20 0314 11/18/20 0944         Subjective: Seen and examined.  Patient anxious and panicked and was complaining of pain under the left breast calling it " heart pain" because she had not received her " heart medications" today.  She was referring to her antihypertensives.  She tells me  that she gets this heartburn every now and then when there is some delay in getting her medications.  Objective: Vitals:   11/19/20 2009 11/19/20 2335 11/20/20 0514 11/20/20 0742  BP: 134/73 124/67 (!) 154/81 (!) 157/85  Pulse: (!) 105 84 92 (!) 103  Resp: 16 16 16 16   Temp: 97.9 F (36.6 C) 97.8 F (36.6 C) 98 F (36.7 C) 98.6 F (37 C)  TempSrc:  Oral    SpO2: 97% 99% 99% 98%  Weight:      Height:        Intake/Output Summary (Last 24 hours) at 11/20/2020 0943 Last data filed at 11/20/2020 0516 Gross per 24 hour  Intake --  Output 1700 ml  Net -1700 ml   Filed Weights   11/17/20 1814  Weight: 66.7 kg    Examination:  General exam: Appears calm and comfortable  Respiratory system: Clear to auscultation. Respiratory effort normal.,  Point  tenderness at left chest Cardiovascular system: S1 & S2 heard, RRR. No JVD, murmurs, rubs, gallops or clicks. No pedal edema. Gastrointestinal system: Abdomen is nondistended, soft and nontender. No organomegaly or masses felt. Normal bowel sounds heard. Central nervous system: Alert and oriented. No focal neurological deficits. Skin: No rashes, lesions or ulcers.  Psychiatry: Judgement and insight appear normal. Mood & affect very anxious  Data Reviewed: I have personally reviewed following labs and imaging studies  CBC: Recent Labs  Lab 11/17/20 2217 11/18/20 0415 11/19/20 0605 11/20/20 0546  WBC 36.0* 24.6* 25.9* 18.1*  NEUTROABS 31.0*  --   --   --   HGB 16.0* 15.4* 13.6 12.1  HCT 44.3 41.8 37.8 35.3*  MCV 85.4 84.4 86.3 91.5  PLT 397 328 293 893   Basic Metabolic Panel: Recent Labs  Lab 11/17/20 2217 11/18/20 0415 11/19/20 0605 11/20/20 0546  NA 122* 121* 126* 135  K 4.2 5.1 4.3 5.3*  CL 83* 88* 95* 102  CO2 25 22 21* 26  GLUCOSE 170* 168* 148* 101*  BUN 32* 24* 21 24*  CREATININE 1.59* 1.20* 1.56* 1.35*  CALCIUM 9.9 9.3 8.9 8.9   GFR: Estimated Creatinine Clearance: 29.3 mL/min (A) (by C-G formula based  on SCr of 1.35 mg/dL (H)). Liver Function Tests: No results for input(s): AST, ALT, ALKPHOS, BILITOT, PROT, ALBUMIN in the last 168 hours. No results for input(s): LIPASE, AMYLASE in the last 168 hours. No results for input(s): AMMONIA in the last 168 hours. Coagulation Profile: No results for input(s): INR, PROTIME in the last 168 hours. Cardiac Enzymes: No results for input(s): CKTOTAL, CKMB, CKMBINDEX, TROPONINI in the last 168 hours. BNP (last 3 results) No results for input(s): PROBNP in the last 8760 hours. HbA1C: No results for input(s): HGBA1C in the last 72 hours. CBG: No results for input(s): GLUCAP in the last 168 hours. Lipid Profile: No results for input(s): CHOL, HDL, LDLCALC, TRIG, CHOLHDL, LDLDIRECT in the last 72 hours. Thyroid Function Tests: No results for input(s): TSH, T4TOTAL, FREET4, T3FREE, THYROIDAB in the last 72 hours. Anemia Panel: No results for input(s): VITAMINB12, FOLATE, FERRITIN, TIBC, IRON, RETICCTPCT in the last 72 hours. Sepsis Labs: No results for input(s): PROCALCITON, LATICACIDVEN in the last 168 hours.  Recent Results (from the past 240 hour(s))  Resp Panel by RT-PCR (Flu A&B, Covid) Nasopharyngeal Swab     Status: None   Collection Time: 11/17/20 10:17 PM   Specimen: Nasopharyngeal Swab; Nasopharyngeal(NP) swabs in vial transport medium  Result Value Ref Range Status   SARS Coronavirus 2 by RT PCR NEGATIVE NEGATIVE Final    Comment: (NOTE) SARS-CoV-2 target nucleic acids are NOT DETECTED.  The SARS-CoV-2 RNA is generally detectable in upper respiratory specimens during the acute phase of infection. The lowest concentration of SARS-CoV-2 viral copies this assay can detect is 138 copies/mL. A negative result does not preclude SARS-Cov-2 infection and should not be used as the sole basis for treatment or other patient management decisions. A negative result may occur with  improper specimen collection/handling, submission of specimen  other than nasopharyngeal swab, presence of viral mutation(s) within the areas targeted by this assay, and inadequate number of viral copies(<138 copies/mL). A negative result must be combined with clinical observations, patient history, and epidemiological information. The expected result is Negative.  Fact Sheet for Patients:  EntrepreneurPulse.com.au  Fact Sheet for Healthcare Providers:  IncredibleEmployment.be  This test is no t yet approved or cleared by the [place] FDA and  has  been authorized for detection and/or diagnosis of SARS-CoV-2 by FDA under an Emergency Use Authorization (EUA). This EUA will remain  in effect (meaning this test can be used) for the duration of the COVID-19 declaration under Section 564(b)(1) of the Act, 21 U.S.C.section 360bbb-3(b)(1), unless the authorization is terminated  or revoked sooner.       Influenza A by PCR NEGATIVE NEGATIVE Final   Influenza B by PCR NEGATIVE NEGATIVE Final    Comment: (NOTE) The Xpert Xpress SARS-CoV-2/FLU/RSV plus assay is intended as an aid in the diagnosis of influenza from Nasopharyngeal swab specimens and should not be used as a sole basis for treatment. Nasal washings and aspirates are unacceptable for Xpert Xpress SARS-CoV-2/FLU/RSV testing.  Fact Sheet for Patients: EntrepreneurPulse.com.au  Fact Sheet for Healthcare Providers: IncredibleEmployment.be  This test is not yet approved or cleared by the [place] FDA and has been authorized for detection and/or diagnosis of SARS-CoV-2 by FDA under an Emergency Use Authorization (EUA). This EUA will remain in effect (meaning this test can be used) for the duration of the COVID-19 declaration under Section 564(b)(1) of the Act, 21 U.S.C. section 360bbb-3(b)(1), unless the authorization is terminated or revoked.  Performed at [facility], [address]  [place], [place] [address]       Radiology Studies: DG HIP [facility] OR W/O PELVIS 2-3 VIEWS LEFT  Result Date: 11/18/2020 CLINICAL DATA:  Status post total hip replacement EXAM: DG HIP (WITH OR WITHOUT PELVIS) 2-3V LEFT COMPARISON:  November 18, 2020 study obtained earlier in the day FINDINGS: Frontal mid to lower pelvis and hips as well as lateral left hip images were obtained. There is a total hip replacement on the left with prosthetic components well-seated. No fracture or dislocation in the area of surgery. Fractures of the left superior pubic ramus and ischium again noted without change. There is mild narrowing of the right hip joint. IMPRESSION: Total hip replacement on the left with prosthetic components well-seated. No fracture or dislocation in the area of hip replacement. Fractures of the left ischium and left superior pubic ramus are unchanged compared to earlier in the day. Mild narrowing right hip joint. No new fracture appreciable. Electronically Signed   By: Lowella Grip III M.D.   On: 11/18/2020 12:42    Scheduled Meds: . amLODipine  5 mg Oral Daily  . atenolol  12.5 mg Oral Daily  . cloNIDine  0.2 mg Oral TID  . docusate sodium  100 mg Oral BID  . enoxaparin (LOVENOX) injection  40 mg Subcutaneous Q24H  . [START ON 11/21/2020] furosemide  20 mg Oral Once per day on Tue Thu Sat  . losartan  100 mg Oral Daily  . predniSONE  10 mg Oral Daily  . sodium chloride  1 g Oral BID WC  . spironolactone  25 mg Oral Daily  . cyanocobalamin  1,000 mcg Oral Daily   Continuous Infusions: . sodium chloride 75 mL/hr at 11/18/20 2204     LOS: 2 days   Time spent: 33 minutes   Darliss Cheney, MD [facility]  11/20/2020, 9:43 AM   To contact the attending provider between 7A-7P or the covering provider during after hours 7P-7A, please log into the web site [email or web address].

## 2020-11-20 NOTE — Progress Notes (Signed)
Initial Nutrition Assessment  DOCUMENTATION CODES:   Not applicable  INTERVENTION:  Provide Nepro Shake po once daily, each supplement provides 425 kcal and 19 grams protein.  NUTRITION DIAGNOSIS:   Increased nutrient needs related to post-op healing as evidenced by estimated needs.  GOAL:   Patient will meet greater than or equal to 90% of their needs  MONITOR:   PO intake, Supplement acceptance, Labs, Weight trends, I & O's  REASON FOR ASSESSMENT:   Consult Assessment of nutrition requirement/status  ASSESSMENT:   79 year old female with PMHx of HTN, anxiety disorder, vitamin D deficiency, hx COVID-19 12/2019, CKD stage III, pauci-immune glomerulonephritis diagnosed 03/2020 on long-term prednisone therapy admitted with closed left hip fracture s/p left hip unipolar hemiarthroplasty 11/27, acute hyponatremia secondary to SIADH.  Met with patient at bedside. She reports she has a good appetite at baseline. It has been decreased since her surgery but she is still trying to eat well at meals. She reports she is currently eating about 75% of her meals. She reports at home she eats 3 well-balanced meals daily with adequate protein. Discussed increased nutrient needs for post-operative healing. Patient is amenable to drinking ONS. Noted patient currently with hyperkalemia so will order Nepro at this time.   Patient denies any weight loss and reports she is weight-stable at around 142 lbs usually. Patient currently documented to be 66.7 kg (147.05 lbs).   Medications reviewed and include: Colace 100 mg BID, Lasix 20 mg once daily on Tuesday/Thursday/Saturday, prednisone 10 mg daily, spironolactone, vitamin B12 1000 micrograms daily, NS at 75 mL/hr.  Labs reviewed: Potassium 5.3, BUN 24, Creatinine 1.35.  Patient does not meet criteria for malnutrition at this time.   NUTRITION - FOCUSED PHYSICAL EXAM:    Most Recent Value  Orbital Region No depletion  Upper Arm Region No depletion   Thoracic and Lumbar Region No depletion  Buccal Region No depletion  Temple Region No depletion  Clavicle Bone Region No depletion  Clavicle and Acromion Bone Region No depletion  Scapular Bone Region No depletion  Dorsal Hand No depletion  Patellar Region No depletion  Anterior Thigh Region No depletion  Posterior Calf Region No depletion  Edema (RD Assessment) None  Hair Reviewed  Eyes Reviewed  Mouth Reviewed  Skin Reviewed  Nails Reviewed     Diet Order:   Diet Order            Diet regular Room service appropriate? Yes; Fluid consistency: Thin  Diet effective now                EDUCATION NEEDS:   No education needs have been identified at this time  Skin:  Skin Assessment: Skin Integrity Issues: (closed incision left hip)  Last BM:  11/17/2200 per chart  Height:   Ht Readings from Last 1 Encounters:  11/17/20 5' (1.524 m)   Weight:   Wt Readings from Last 1 Encounters:  11/17/20 66.7 kg   BMI:  Body mass index is 28.72 kg/m.  Estimated Nutritional Needs:   Kcal:  1700-1900  Protein:  85-95 grams  Fluid:  1.7 L/day  Jacklynn Barnacle, MS, RD, LDN Pager number available on Amion

## 2020-11-20 NOTE — Plan of Care (Signed)
  Problem: Activity: Goal: Ability to ambulate and perform ADLs will improve Outcome: Progressing   Problem: Clinical Measurements: Goal: Postoperative complications will be avoided or minimized Outcome: Progressing   Problem: Self-Concept: Goal: Ability to maintain and perform role responsibilities to the fullest extent possible will improve Outcome: Progressing   Problem: Pain Management: Goal: Pain level will decrease Outcome: Progressing   

## 2020-11-20 NOTE — Progress Notes (Signed)
Physical Therapy Treatment Patient Details Name: Linda Bradley MRN: [id number] DOB: [birth year] Today's Date: 11/20/2020    History of Present Illness Pt is a 79yo F admitted to [facility] on 11/17/20 for acute pain and disability. Pt began to experience increasing LLE pain that became dibilitating to the point where she was unable to stand or ambulate with AD. Pt with significant PMH including: anxiety, Pauci-immune Glomerulonephritis, HTN, CKD (stage IIIb), DDD, formainal stenosis, hx back sx, and cLBP. Imaging significant for: severe levoscoliosis with mod-advanced multilevel degenerative spondylosis and facet arthrosis, subacute bil sacral insufficiency fx, transverse subcapital fx of the L femoral neck with varus angulation, with fx of the L superior/inferior pubic rami possibly demonstrating healing. Pt s/p L hip unipolar hemiarthroplasty on 11/18/20 by Dr. Roland Rack and is WBAT on LLE.    PT Comments    Pt received in seated position in the recliner upon arrival and agreeable to therapy.  Pt was able to perform seated level exercises including LAQ's and therapist resisted hamstring curls.  Pt then proceeded to perform transfer training with good carryover from AM session.  Pt with good understanding of hand placement and able to come upright without much difficulty.  Pt then proceeded to shuffle feet over the the bed where she was able to transfer to seated EOB.  Pt educated on how to assist the LLE with the RLE hooking movement pattern in order to bring LLE up to the bed.  Pt did require some assistance from therapist in getting the LLE back into bed.  Pt was encouraged to continue to perform bed-level exercises throughout the day/night to remain active and be stronger for therapy session tomorrow.  Current discharge plans to SNF remain appropriate at this time.  Pt will continue to benefit from skilled therapy in order to address deficits listed below.    Follow Up Recommendations   Supervision/Assistance - 24 hour;SNF     Equipment Recommendations  3in1 (PT)    Recommendations for Other Services Rehab consult;OT consult     Precautions / Restrictions Precautions Precautions: Posterior Hip;Fall Precaution Booklet Issued: No Precaution Comments: posterior hip precautions implemented for pain management. Restrictions Weight Bearing Restrictions: Yes LLE Weight Bearing: Weight bearing as tolerated Other Position/Activity Restrictions: hip abd brace donned in bed    Mobility  Bed Mobility Overal bed mobility: Needs Assistance Bed Mobility: Sit to Supine     Supine to sit: Mod assist Sit to supine: Mod assist   General bed mobility comments: Pt required modA for assisting the LLE into the bed.  Transfers Overall transfer level: Needs assistance Equipment used: Rolling walker (2 wheeled) Transfers: Sit to/from Stand Sit to Stand: Min assist         General transfer comment: Pt able to come upright with minA utilizing verbal cues for hand placement on chair and RW.  Ambulation/Gait Ambulation/Gait assistance: Min assist Gait Distance (Feet): 3 Feet Assistive device: Rolling walker (2 wheeled)   Gait velocity: decreased       Stairs             Wheelchair Mobility    Modified Rankin (Stroke Patients Only)       Balance Overall balance assessment: Needs assistance Sitting-balance support: Feet supported Sitting balance-Leahy Scale: Good Sitting balance - Comments: Pt with good sitting balance at EOB with LE supported.   Standing balance support: Bilateral upper extremity supported;During functional activity Standing balance-Leahy Scale: Fair Standing balance comment: Pt required minA for standing and with mobility once in  standing position.                            Cognition Arousal/Alertness: Awake/alert Behavior During Therapy: WFL for tasks assessed/performed Overall Cognitive Status: Within Functional Limits  for tasks assessed                                        Exercises Total Joint Exercises Ankle Circles/Pumps: AROM;Strengthening;Both;10 reps;Supine Quad Sets: AROM;Strengthening;Both;10 reps;Supine Gluteal Sets: AROM;Strengthening;Both;10 reps;Supine Hip ABduction/ADduction: AROM;Strengthening;Both;10 reps;Supine Straight Leg Raises: AROM;Strengthening;Right;AAROM;Left;10 reps;Supine Marching in Standing: AROM;Strengthening;Both;10 reps;Standing    General Comments        Pertinent Vitals/Pain Pain Assessment: Faces Faces Pain Scale: Hurts even more Pain Location: L hip Pain Descriptors / Indicators: Grimacing;Guarding Pain Intervention(s): Limited activity within patient's tolerance;Monitored during session;Repositioned    Home Living                      Prior Function            PT Goals (current goals can now be found in the care plan section) Acute Rehab PT Goals Patient Stated Goal: to go home PT Goal Formulation: With patient Time For Goal Achievement: 12/03/20 Potential to Achieve Goals: Fair Progress towards PT goals: Progressing toward goals    Frequency    BID      PT Plan Current plan remains appropriate    Co-evaluation              AM-PAC PT "6 Clicks" Mobility   Outcome Measure  Help needed turning from your back to your side while in a flat bed without using bedrails?: A Lot Help needed moving from lying on your back to sitting on the side of a flat bed without using bedrails?: A Lot Help needed moving to and from a bed to a chair (including a wheelchair)?: A Little Help needed standing up from a chair using your arms (e.g., wheelchair or bedside chair)?: A Little Help needed to walk in hospital room?: A Little Help needed climbing 3-5 steps with a railing? : Total 6 Click Score: 14    End of Session Equipment Utilized During Treatment: Gait belt Activity Tolerance: Patient limited by pain Patient left:  with call bell/phone within reach;with SCD's reapplied;in bed;with bed alarm set (hip abd pillow in place) Nurse Communication: Mobility status PT Visit Diagnosis: Unsteadiness on feet (R26.81);Difficulty in walking, not elsewhere classified (R26.2);Muscle weakness (generalized) (M62.81);Pain Pain - Right/Left: Left Pain - part of body: Hip     Time: 7591-6384 PT Time Calculation (min) (ACUTE ONLY): 25 min  Charges:  $Gait Training: 8-22 mins $Therapeutic Exercise: 8-22 mins                     Gwenlyn Saran, PT, DPT 11/20/20, 3:49 PM

## 2020-11-20 NOTE — TOC Progression Note (Signed)
Transition of Care [facility]) - Progression Note    Patient Details  Name: Linda Bradley MRN: [id number] Date of Birth: 1941-02-12  [facility] CM/SW Contact  Meriel Flavors, LCSW Phone Number: 11/20/2020, 6:46 PM  Clinical Narrative:    Patient was scheduled to go to CIR today, when ask patient refused stating she wants to be closer to home. Patient is now being recommended for SNF placement. CSW completed workup, obtained pasrr# and sent out for bed offers. Due to patient's insurance when offer is made, SNF will have to obtain insurance authorization        Expected Discharge Plan and Services                                                 Social Determinants of Health (SDOH) Interventions    Readmission Risk Interventions No flowsheet data found.

## 2020-11-20 NOTE — Progress Notes (Signed)
Physical Therapy Treatment Patient Details Name: Linda Bradley MRN: [id number] DOB: 06-13-1941 Today's Date: 11/20/2020    History of Present Illness Pt is a 79yo F admitted to [facility] on 11/17/20 for acute pain and disability. Pt began to experience increasing LLE pain that became dibilitating to the point where she was unable to stand or ambulate with AD. Pt with significant PMH including: anxiety, Pauci-immune Glomerulonephritis, HTN, CKD (stage IIIb), DDD, formainal stenosis, hx back sx, and cLBP. Imaging significant for: severe levoscoliosis with mod-advanced multilevel degenerative spondylosis and facet arthrosis, subacute bil sacral insufficiency fx, transverse subcapital fx of the L femoral neck with varus angulation, with fx of the L superior/inferior pubic rami possibly demonstrating healing. Pt s/p L hip unipolar hemiarthroplasty on 11/18/20 by Dr. Roland Rack and is WBAT on LLE.    PT Comments    Pt received in supine position upon arrival and agreeable to therapy.  Pt notes that she is in much better state of mind/feeling better than when attempted therapy this AM.  Pt was able to perform bed-level exercises with mild difficulty in the LLE.  Pt required assistance for SLR and Hip abduction while in supine position, however was making improvements as therapy progressed requiring less assistance from therapist.  Pt then proceeded to perform bed mobility with modA for LE assistance in navigating bed.  Pt required verbal cuing throughout process for hand placement, which was essential for coming upright with use of FWW.  Pt then proceeded to march and weight shift in place before attempting to transfer to chair.  Pt did not have ability to lift RLE completely off the floor due to pain in the LLE.  Pt able to shuffle feet over to the recliner and transfer to sitting.  Pt left with call bell, phone and all other needs being met upon leaving the room.  Current recommendation updated to SNF due to  progress made during session.       Follow Up Recommendations  Supervision/Assistance - 24 hour;SNF     Equipment Recommendations  3in1 (PT)    Recommendations for Other Services Rehab consult;OT consult     Precautions / Restrictions Precautions Precautions: Posterior Hip;Fall Precaution Booklet Issued: No Precaution Comments: posterior hip precautions implemented for pain management. Restrictions Weight Bearing Restrictions: Yes LLE Weight Bearing: Weight bearing as tolerated Other Position/Activity Restrictions: hip abd brace donned in bed    Mobility  Bed Mobility Overal bed mobility: Needs Assistance Bed Mobility: Supine to Sit     Supine to sit: Mod assist     General bed mobility comments: Pt required verbal cuing for hand placement and modA for LE mobility in the bed.  Pt also utilized hand held assist for bringing trunk upright to be seated at EOB.  Transfers Overall transfer level: Needs assistance Equipment used: Rolling walker (2 wheeled) Transfers: Sit to/from Stand Sit to Stand: Min assist         General transfer comment: Pt able to come upright with minA utilizing verbal cues for hand placement on bed and RW.  Ambulation/Gait Ambulation/Gait assistance: Min assist Gait Distance (Feet): 3 Feet (Shorts steps over the recliner.)             Stairs             Wheelchair Mobility    Modified Rankin (Stroke Patients Only)       Balance Overall balance assessment: Needs assistance Sitting-balance support: Feet supported Sitting balance-Leahy Scale: Good Sitting balance - Comments: Pt with  good sitting balance at EOB with LE supported.   Standing balance support: Bilateral upper extremity supported;During functional activity Standing balance-Leahy Scale: Fair Standing balance comment: Pt required minA for standing and with mobility once in standing position.                            Cognition Arousal/Alertness:  Awake/alert Behavior During Therapy: WFL for tasks assessed/performed Overall Cognitive Status: Within Functional Limits for tasks assessed                                        Exercises Total Joint Exercises Ankle Circles/Pumps: AROM;Strengthening;Both;10 reps;Supine Quad Sets: AROM;Strengthening;Both;10 reps;Supine Gluteal Sets: AROM;Strengthening;Both;10 reps;Supine Hip ABduction/ADduction: AROM;Strengthening;Both;10 reps;Supine Straight Leg Raises: AROM;Strengthening;Right;AAROM;Left;10 reps;Supine Marching in Standing: AROM;Strengthening;Both;10 reps;Standing    General Comments        Pertinent Vitals/Pain Pain Assessment: Faces Faces Pain Scale: Hurts even more Pain Location: L hip Pain Descriptors / Indicators: Grimacing;Guarding Pain Intervention(s): Limited activity within patient's tolerance;Monitored during session;Premedicated before session;Repositioned    Home Living                      Prior Function            PT Goals (current goals can now be found in the care plan section) Acute Rehab PT Goals Patient Stated Goal: to go home PT Goal Formulation: With patient Time For Goal Achievement: 12/03/20 Potential to Achieve Goals: Fair Progress towards PT goals: Progressing toward goals    Frequency    BID      PT Plan Current plan remains appropriate    Co-evaluation              AM-PAC PT "6 Clicks" Mobility   Outcome Measure  Help needed turning from your back to your side while in a flat bed without using bedrails?: A Lot Help needed moving from lying on your back to sitting on the side of a flat bed without using bedrails?: A Lot Help needed moving to and from a bed to a chair (including a wheelchair)?: A Little Help needed standing up from a chair using your arms (e.g., wheelchair or bedside chair)?: A Lot Help needed to walk in hospital room?: A Little Help needed climbing 3-5 steps with a railing? :  Total 6 Click Score: 13    End of Session Equipment Utilized During Treatment: Gait belt Activity Tolerance: Patient limited by pain Patient left: with call bell/phone within reach;in chair;with chair alarm set;with SCD's reapplied (hip abd pillow in place) Nurse Communication: Mobility status PT Visit Diagnosis: Unsteadiness on feet (R26.81);Difficulty in walking, not elsewhere classified (R26.2);Muscle weakness (generalized) (M62.81);Pain Pain - Right/Left: Left Pain - part of body: Hip     Time: 1127-1205 PT Time Calculation (min) (ACUTE ONLY): 38 min  Charges:  $Gait Training: 8-22 mins $Therapeutic Exercise: 23-37 mins                     Gwenlyn Saran, PT, DPT 11/20/20, 1:05 PM

## 2020-11-21 DIAGNOSIS — S72002A Fracture of unspecified part of neck of left femur, initial encounter for closed fracture: Secondary | ICD-10-CM | POA: Diagnosis not present

## 2020-11-21 LAB — BASIC METABOLIC PANEL
Anion gap: 8 (ref 5–15)
BUN: 30 mg/dL — ABNORMAL HIGH (ref 8–23)
CO2: 26 mmol/L (ref 22–32)
Calcium: 8.9 mg/dL (ref 8.9–10.3)
Chloride: 100 mmol/L (ref 98–111)
Creatinine, Ser: 1.19 mg/dL — ABNORMAL HIGH (ref 0.44–1.00)
GFR, Estimated: 47 mL/min — ABNORMAL LOW (ref >60)
Glucose, Bld: 90 mg/dL (ref 70–99)
Potassium: 4.3 mmol/L (ref 3.5–5.1)
Sodium: 134 mmol/L — ABNORMAL LOW (ref 135–145)

## 2020-11-21 LAB — CBC WITH DIFFERENTIAL/PLATELET
Abs Immature Granulocytes: 0.17 10*3/uL — ABNORMAL HIGH (ref 0.00–0.07)
Basophils Absolute: 0 10*3/uL (ref 0.0–0.1)
Basophils Relative: 0 %
Eosinophils Absolute: 0.4 10*3/uL (ref 0.0–0.5)
Eosinophils Relative: 2 %
HCT: 34.7 % — ABNORMAL LOW (ref 36.0–46.0)
Hemoglobin: 12.1 g/dL (ref 12.0–15.0)
Immature Granulocytes: 1 %
Lymphocytes Relative: 19 %
Lymphs Abs: 3.1 10*3/uL (ref 0.7–4.0)
MCH: 31.2 pg (ref 26.0–34.0)
MCHC: 34.9 g/dL (ref 30.0–36.0)
MCV: 89.4 fL (ref 80.0–100.0)
Monocytes Absolute: 1.8 10*3/uL — ABNORMAL HIGH (ref 0.1–1.0)
Monocytes Relative: 11 %
Neutro Abs: 11.1 10*3/uL — ABNORMAL HIGH (ref 1.7–7.7)
Neutrophils Relative %: 67 %
Platelets: 278 10*3/uL (ref 150–400)
RBC: 3.88 MIL/uL (ref 3.87–5.11)
RDW: 14 % (ref 11.5–15.5)
WBC: 16.5 10*3/uL — ABNORMAL HIGH (ref 4.0–10.5)
nRBC: 0 % (ref 0.0–0.2)

## 2020-11-21 LAB — SURGICAL PATHOLOGY

## 2020-11-21 NOTE — Progress Notes (Signed)
  Subjective: 3 Days Post-Op Procedure(s) (LRB): ARTHROPLASTY BIPOLAR HIP (HEMIARTHROPLASTY) (Left) Patient reports pain as mild.   Patient is well with no complaints. Negative for chest pain and shortness of breath Fever: no Gastrointestinal:Negative for nausea and vomiting.  Still working on bowel movement.  Tolerating p.o. well.  Objective: Vital signs in last 24 hours: Temp:  [98 F (36.7 C)-99.2 F (37.3 C)] 98.6 F (37 C) (11/30 1123) Pulse Rate:  [86-111] 89 (11/30 1123) Resp:  [15-17] 16 (11/30 1123) BP: (114-152)/(70-91) 114/72 (11/30 1123) SpO2:  [95 %-98 %] 97 % (11/30 1123)  Intake/Output from previous day:  Intake/Output Summary (Last 24 hours) at 11/21/2020 1210 Last data filed at 11/21/2020 1010 Gross per 24 hour  Intake 240 ml  Output 1225 ml  Net -985 ml    Intake/Output this shift: Total I/O In: 240 [P.O.:240] Out: -   Labs: Recent Labs    11/19/20 0605 11/20/20 0546 11/21/20 0633  HGB 13.6 12.1 12.1   Recent Labs    11/20/20 0546 11/21/20 0633  WBC 18.1* 16.5*  RBC 3.86* 3.88  HCT 35.3* 34.7*  PLT 263 278   Recent Labs    11/20/20 0546 11/21/20 0633  NA 135 134*  K 5.3* 4.3  CL 102 100  CO2 26 26  BUN 24* 30*  CREATININE 1.35* 1.19*  GLUCOSE 101* 90  CALCIUM 8.9 8.9   No results for input(s): LABPT, INR in the last 72 hours.   EXAM General - Patient is Alert, Appropriate and Oriented Extremity - ABD soft Sensation intact distally Intact pulses distally Dorsiflexion/Plantar flexion intact Incision: scant drainage No cellulitis present Dressing/Incision -scant drainage.  Thigh is soft. Motor Function - intact, moving foot and toes well on exam.    Past Medical History:  Diagnosis Date  . Anxiety disorder   . Dermatitis   . 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 Heart murmur   . Hypercholesterolemia   . Hypertension   . Lymphadenitis   . Renal disorder   . Skin cancer   . Spontaneous ecchymoses   .  Vitamin D deficiency     Assessment/Plan: 3 Days Post-Op Procedure(s) (LRB): ARTHROPLASTY BIPOLAR HIP (HEMIARTHROPLASTY) (Left) Principal Problem:   Closed left hip fracture, initial encounter [facility]) Active Problems:   Essential hypertension   Hyponatremia   Pauci-immune Glomerulonephritis   On long term prednisone therapy   Stage 3b chronic kidney disease (HCC)   Chronic back pain   Leukocytosis   Closed fracture of multiple pubic rami, left, initial encounter ([place])   Closed intertrochanteric fracture of left femur (HCC)  Estimated body mass index is 28.72 kg/m as calculated from the following:   Height as of this encounter: 5' (1.524 m).   Weight as of this encounter: 66.7 kg. Up with therapy  Continue working on bowel movement Labs stable Vital signs are stable Care management to assist with discharge.  Follow-up with [facility] orthopedics in 2 weeks Lovenox 40 mg subcu daily x14 days at discharge TED hose bilateral lower extremity x6 weeks  DVT Prophylaxis - Lovenox, Foot Pumps and TED hose Weight-Bearing as tolerated to Left leg  T. Rachelle Hora, PA-C [facility] Orthopaedic Surgery 11/21/2020, 12:10 PM

## 2020-11-21 NOTE — Progress Notes (Signed)
PT Cancellation Note  Patient Details Name: Linda Bradley MRN: [id number] DOB: 1941-05-31   Cancelled Treatment:    Reason Eval/Treat Not Completed: Patient declined, no reason specified.  Pt notes that her purewick is no longer working and she is waiting for the nursing staff to change her bedding before she will work with therapy.  Will attempt as appropriate.   Gwenlyn Saran, PT, DPT 11/21/20, 11:12 AM

## 2020-11-21 NOTE — Progress Notes (Signed)
PROGRESS NOTE    DEETTA SIEGMANN  GBT:[id number] DOB: 1941/01/24 DOA: 11/17/2020 PCP: Lynnell Jude, MD   Brief Narrative:  Linda Bradley is a 79 y.o. 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   She denied falling or making any sudden movement that may have precipitated the pain. States prior to the onset of the pain she was in her usual state of health and felt like she was doing fairly well.  On arrival she was afebrile, BP 168/78 heart rate 98 O2 sat 96% on room air.  She had a CT lumbar spine that showed subacute bilateral sacral insufficiency fractures similar in appearance to previous MRI with no other acute abnormality.  She does have moderate to advanced multilevel degenerative spondylosis and facet arthrosis relatively stable from prior CTs.  Blood work, significant for leukocytosis of 36,000 with hemoglobin 16.  Sodium 122. Covid and flu test negative.  Patient was treated with prednisone and narcotics in the emergency room without significant improvement.  Hospitalist consulted for admission. Following admission, hip x-ray was done showing " transverse subcapital fracture of the left femoral neck with varus angulation with fractures of the left superior and inferior pubic rami possibly demonstrating healing.  Orthopedics were consulted.  Patient underwent left hip unipolar hemiarthroplasty on 11/18/2020.  Did well postop.  Seen by PT OT who recommended CIR. but  patient refused to go to CIR and now we are pursuing SNF option.  Assessment & Plan:   Principal Problem:   Closed left hip fracture, initial encounter [facility]) Active Problems:   Essential hypertension   Hyponatremia   Pauci-immune Glomerulonephritis   On long term prednisone therapy   Stage 3b chronic kidney disease (HCC)   Chronic back pain   Leukocytosis   Closed fracture of multiple pubic rami, left, initial encounter ([place])   Closed intertrochanteric fracture of left femur (HCC)   Closed left hip fracture, suspect pathologic fracture/Left superior and inferior pubic rami fractures: No fall involved. fracture most likely due to being on steroids chronically.  Orthopedic on board.  Postoperative day #3 status post left hip unipolar hemiarthroplasty.  Pain controlled.  Management per orthopedics.  PT recommends CIR but patient declined that.  Now we are pursuing SNF.  TOC on board.  According to the information provided to me, due to the type of insurance that she has, it may take 3 to 4 days to get insurance authorization.   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  CKD stage IIIb: At baseline.  Monitor.  History of Covid: -Patient had Covid in January 2021 has been weaned off of chronic oxygen within the past month -Saturating in the high 90s on room air  Essential hypertension with tachycardia: Controlled.  Continue amlodipine, atenolol, clonidine and losartan.  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  However, numbers were not that high. -WBC of 36,000 without stigmata of infection upon presentation.  Leukocytosis improving.     Pauci-immune Glomerulonephritis, April 2021   On long term prednisone therapy   Stage 3b chronic kidney disease ([place]) -Patient followed by nephrology, last seen 11/13/2020 -Continue prednisone 10 mg daily  Hyper kalemia: Resolved.   DVT prophylaxis: enoxaparin (LOVENOX) injection 40 mg Start: 11/19/20 0800 SCDs Start: 11/18/20 1114 Place TED hose Start: 11/18/20 1114   Code Status: Full Code  Family Communication:  None present at bedside.  Plan of care discussed with patient in length and he verbalized understanding and agreed with it.  Status is: Inpatient  Remains inpatient appropriate because:Inpatient level of care appropriate due to severity of illness   Dispo: The patient is from: Home              Anticipated d/c is to: CIR vs SNF              Anticipated d/c date is: 1 to 2 days              Patient currently is medically stable to d/c.    Estimated body mass index is 28.72 kg/m as calculated from the following:   Height as of this encounter: 5' (1.524 m).   Weight as of this encounter: 66.7 kg.      Nutritional status:  Nutrition Problem: Increased nutrient needs Etiology: post-op healing   Signs/Symptoms: estimated needs   Interventions: Nepro shake    Consultants:   Orthopedics  Procedures:    as above  Antimicrobials:  Anti-infectives (From admission, onward)   Start     Dose/Rate Route Frequency Ordered Stop   11/18/20 1630  ceFAZolin (ANCEF) IVPB 2g/100 mL premix        2 g 200 mL/hr over 30 Minutes Intravenous Every 6 hours 11/18/20 1629 11/19/20 0436   11/18/20 0910  ceFAZolin (ANCEF) 2-4 GM/100ML-% IVPB       Note to Pharmacy: Register, Karen   : cabinet override      11/18/20 0910 11/18/20 1706   11/18/20 0312  ceFAZolin (ANCEF) IVPB 2g/100 mL premix        2 g 200 mL/hr over 30 Minutes Intravenous 30 min pre-op 11/18/20 0314 11/18/20 0944         Subjective: Seen and examined.  Feels much better.  Pain  controlled.  Objective: Vitals:   11/20/20 2348 11/20/20 2349 11/21/20 0359 11/21/20 0721  BP: (!) 150/91 (!) 150/91 (!) 149/85 (!) 152/82  Pulse: 88 88 86 89  Resp: 16 16 15 16   Temp: 98.3 F (36.8 C) 98.3 F (36.8 C) 98.3 F (36.8 C) 98.6 F (37 C)  TempSrc: Oral Oral  Oral  SpO2: 97% 97% 97% 97%  Weight:      Height:        Intake/Output Summary (Last 24 hours) at 11/21/2020 0844 Last data filed at 11/21/2020 0531 Gross per 24 hour  Intake --  Output 1225 ml  Net -1225 ml   Filed Weights   11/17/20 1814  Weight: 66.7 kg    Examination:  General exam: Appears calm and comfortable  Respiratory system: Clear to auscultation. Respiratory effort normal. Cardiovascular system: S1 & S2 heard, RRR. No JVD, murmurs, rubs, gallops or clicks. No pedal edema. Gastrointestinal system: Abdomen is nondistended, soft and nontender. No organomegaly or masses felt. Normal  bowel sounds heard. Central nervous system: Alert and oriented. No focal neurological deficits. Extremities: Symmetric 5 x 5 power. Skin: No rashes, lesions or ulcers.  Psychiatry: Judgement and insight appear normal. Mood & affect appropriate.   Data Reviewed: I have personally reviewed following labs and imaging studies  CBC: Recent Labs  Lab 11/17/20 2217 11/18/20 0415 11/19/20 0605 11/20/20 0546 11/21/20 0633  WBC 36.0* 24.6* 25.9* 18.1* 16.5*  NEUTROABS 31.0*  --   --   --  11.1*  HGB 16.0* 15.4* 13.6 12.1 12.1  HCT 44.3 41.8 37.8 35.3* 34.7*  MCV 85.4 84.4 86.3 91.5 89.4  PLT 397 328 293 263 409   Basic Metabolic Panel: Recent Labs  Lab 11/17/20 2217 11/18/20 0415 11/19/20 0605 11/20/20 0546 11/21/20 0633  NA 122* 121* 126* 135 134*  K 4.2 5.1 4.3 5.3* 4.3  CL 83* 88* 95* 102 100  CO2 25 22 21* 26 26  GLUCOSE 170* 168* 148* 101* 90  BUN 32* 24* 21 24* 30*  CREATININE 1.59* 1.20* 1.56* 1.35* 1.19*  CALCIUM 9.9 9.3 8.9 8.9 8.9   GFR: Estimated Creatinine Clearance: 33.2 mL/min (A)  (by C-G formula based on SCr of 1.19 mg/dL (H)). Liver Function Tests: No results for input(s): AST, ALT, ALKPHOS, BILITOT, PROT, ALBUMIN in the last 168 hours. No results for input(s): LIPASE, AMYLASE in the last 168 hours. No results for input(s): AMMONIA in the last 168 hours. Coagulation Profile: No results for input(s): INR, PROTIME in the last 168 hours. Cardiac Enzymes: No results for input(s): CKTOTAL, CKMB, CKMBINDEX, TROPONINI in the last 168 hours. BNP (last 3 results) No results for input(s): PROBNP in the last 8760 hours. HbA1C: No results for input(s): HGBA1C in the last 72 hours. CBG: No results for input(s): GLUCAP in the last 168 hours. Lipid Profile: No results for input(s): CHOL, HDL, LDLCALC, TRIG, CHOLHDL, LDLDIRECT in the last 72 hours. Thyroid Function Tests: No results for input(s): TSH, T4TOTAL, FREET4, T3FREE, THYROIDAB in the last 72 hours. Anemia Panel: No results for input(s): VITAMINB12, FOLATE, FERRITIN, TIBC, IRON, RETICCTPCT in the last 72 hours. Sepsis Labs: No results for input(s): PROCALCITON, LATICACIDVEN in the last 168 hours.  Recent Results (from the past 240 hour(s))  Resp Panel by RT-PCR (Flu A&B, Covid) Nasopharyngeal Swab     Status: None   Collection Time: 11/17/20 10:17 PM   Specimen: Nasopharyngeal Swab; Nasopharyngeal(NP) swabs in vial transport medium  Result Value Ref Range Status   SARS Coronavirus 2 by RT PCR NEGATIVE NEGATIVE Final    Comment: (NOTE) SARS-CoV-2 target nucleic acids are NOT DETECTED.  The SARS-CoV-2 RNA is generally detectable in upper respiratory specimens during the acute phase of infection. The lowest concentration of SARS-CoV-2 viral copies this assay can detect is 138 copies/mL. A negative result does not preclude SARS-Cov-2 infection and should not be used as the sole basis for treatment or other patient management decisions. A negative result may occur with  improper specimen collection/handling,  submission of specimen other than nasopharyngeal swab, presence of viral mutation(s) within the areas targeted by this assay, and inadequate number of viral copies(<138 copies/mL). A negative result must be combined with clinical observations, patient history, and epidemiological information. The expected result is Negative.  Fact Sheet for Patients:  EntrepreneurPulse.com.au  Fact Sheet for Healthcare Providers:  IncredibleEmployment.be  This test is no t yet approved or cleared by the [place] FDA and  has been authorized for detection and/or diagnosis of SARS-CoV-2 by FDA under  an Emergency Use Authorization (EUA). This EUA will remain  in effect (meaning this test can be used) for the duration of the COVID-19 declaration under Section 564(b)(1) of the Act, 21 U.S.C.section 360bbb-3(b)(1), unless the authorization is terminated  or revoked sooner.       Influenza A by PCR NEGATIVE NEGATIVE Final   Influenza B by PCR NEGATIVE NEGATIVE Final    Comment: (NOTE) The Xpert Xpress SARS-CoV-2/FLU/RSV plus assay is intended as an aid in the diagnosis of influenza from Nasopharyngeal swab specimens and should not be used as a sole basis for treatment. Nasal washings and aspirates are unacceptable for Xpert Xpress SARS-CoV-2/FLU/RSV testing.  Fact Sheet for Patients: EntrepreneurPulse.com.au  Fact Sheet for Healthcare Providers: IncredibleEmployment.be  This test is not yet approved or cleared by the [place] FDA and has been authorized for detection and/or diagnosis of SARS-CoV-2 by FDA under an Emergency Use Authorization (EUA). This EUA will remain in effect (meaning this test can be used) for the duration of the COVID-19 declaration under Section 564(b)(1) of the Act, 21 U.S.C. section 360bbb-3(b)(1), unless the authorization is terminated or revoked.  Performed at [facility], [address], [place], [place] [address]       Radiology Studies: No results found.  Scheduled Meds: . amLODipine  5 mg Oral Daily  . atenolol  12.5 mg Oral BID  . cloNIDine  0.2 mg Oral TID  . docusate sodium  100 mg Oral BID  . enoxaparin (LOVENOX) injection  40 mg Subcutaneous Q24H  . furosemide  20 mg Oral Once per day on Tue Thu Sat  . losartan  100 mg Oral Daily  . predniSONE  10 mg Oral Daily  . sodium chloride  1 g Oral BID WC  . spironolactone  25 mg Oral Daily  . cyanocobalamin  1,000 mcg Oral Daily   Continuous Infusions: . sodium chloride 75 mL/hr at 11/18/20 2204     LOS: 3 days   Time spent: 28 minutes   Darliss Cheney, MD [facility]  11/21/2020, 8:44 AM   To contact the attending provider between 7A-7P or the covering provider during after hours 7P-7A, please log into the web site [email or web address].

## 2020-11-21 NOTE — Progress Notes (Signed)
PT Cancellation Note  Patient Details Name: AASHA DINA MRN: [id number] DOB: 02-23-1941   Cancelled Treatment:    Reason Eval/Treat Not Completed: Patient declined.  Pt just completed session with OT and asked to be seen later this AM.  Pt will be seen as appropriate.     Gwenlyn Saran, PT, DPT 11/21/20, 10:36 AM

## 2020-11-21 NOTE — Progress Notes (Signed)
Physical Therapy Treatment Patient Details Name: Linda Bradley MRN: [id number] DOB: [birth year] Today's Date: 11/21/2020    History of Present Illness Pt is a 80yo F admitted to [facility] on 11/17/20 for acute pain and disability. Pt began to experience increasing LLE pain that became dibilitating to the point where she was unable to stand or ambulate with AD. Pt with significant PMH including: anxiety, Pauci-immune Glomerulonephritis, HTN, CKD (stage IIIb), DDD, formainal stenosis, hx back sx, and cLBP. Imaging significant for: severe levoscoliosis with mod-advanced multilevel degenerative spondylosis and facet arthrosis, subacute bil sacral insufficiency fx, transverse subcapital fx of the L femoral neck with varus angulation, with fx of the L superior/inferior pubic rami possibly demonstrating healing. Pt s/p L hip unipolar hemiarthroplasty on 11/18/20 by Dr. Roland Rack and is WBAT on LLE.    PT Comments    Pt received in Semi-Fowler's position and agreeable to therapy.  Pt reports that she is wanting to attempt walking today since we were unable to do much this AM due to fatigue.  Pt showed good improvement with bed transfers and seems to be moving much better than previous sessions.  Pt proceeded to stand from seated EOB position and only require minA.  Pt then used FWW to ambulate in room to the door with verbal cuing for hand placement and posture during ambulation.  Pt verbalized it being easier to walk as she pursued the door.  Pt ambulates very slow with deliberate movement of her feet.  Pt would likely benefit from a shorted walker than what was present in room at time of therapy.  Pt continued to do well with gait training and had good carryover on the return to the bed.  Pt did not require assistance for LEs when returning to supine from seated position.  Pt did utilize verbal cuing for hand placement, but is performing much better.  Current discharge plans to SNF remain appropriate at this time.   Pt will continue to benefit from skilled therapy in order to address deficits listed below.     Follow Up Recommendations  Supervision/Assistance - 24 hour;SNF     Equipment Recommendations  3in1 (PT)    Recommendations for Other Services Rehab consult;OT consult     Precautions / Restrictions Precautions Precautions: Posterior Hip;Fall Precaution Booklet Issued: No Precaution Comments: posterior hip precautions implemented for pain management. Restrictions Other Position/Activity Restrictions: hip abd brace donned in bed    Mobility  Bed Mobility Overal bed mobility: Needs Assistance Bed Mobility: Supine to Sit;Sit to Supine     Supine to sit: Min assist Sit to supine: Min assist   General bed mobility comments: Pt performing much better with transfers and only needs verbal cuing for placement of hands and LE's..  Transfers Overall transfer level: Needs assistance Equipment used: Rolling walker (2 wheeled) Transfers: Sit to/from Stand Sit to Stand: Min assist         General transfer comment: Pt able to come upright with minA utilizing verbal cues for hand placement on chair and RW.  Ambulation/Gait Ambulation/Gait assistance: Min guard Gait Distance (Feet): 35 Feet Assistive device: Rolling walker (2 wheeled) Gait Pattern/deviations: Step-to pattern;Decreased step length - right;Decreased stance time - left;Decreased stride length Gait velocity: decreased   General Gait Details: Pt able to ambulate to the door of the room today and back to the bed.   Stairs             Wheelchair Mobility    Modified Rankin (Stroke Patients Only)  Balance Overall balance assessment: Needs assistance Sitting-balance support: Feet supported Sitting balance-Leahy Scale: Good Sitting balance - Comments: Not assessed as pt declined out of bed activities.   Standing balance support: Bilateral upper extremity supported;During functional activity Standing  balance-Leahy Scale: Fair Standing balance comment: Pt requires heavy use of UEs for support during upright activities.                            Cognition Arousal/Alertness: Awake/alert Behavior During Therapy: WFL for tasks assessed/performed Overall Cognitive Status: Within Functional Limits for tasks assessed                                        Exercises Total Joint Exercises Ankle Circles/Pumps: AROM;Strengthening;Both;10 reps;Supine Quad Sets: AROM;Strengthening;Both;10 reps;Supine Gluteal Sets: AROM;Strengthening;Both;10 reps;Supine Heel Slides: AROM;Strengthening;Both;10 reps;Supine Hip ABduction/ADduction: AROM;Strengthening;Both;10 reps;Supine Straight Leg Raises: AROM;Strengthening;Right;AAROM;Left;10 reps;Supine    General Comments        Pertinent Vitals/Pain Pain Assessment: Faces Faces Pain Scale: Hurts even more Pain Location: Pt reports no pain when lying in bed, but grimacing face when performing movement in/out of bed. Pain Descriptors / Indicators: Grimacing;Guarding Pain Intervention(s): Limited activity within patient's tolerance;Monitored during session;Repositioned    Home Living                      Prior Function            PT Goals (current goals can now be found in the care plan section) Acute Rehab PT Goals Patient Stated Goal: to go home PT Goal Formulation: With patient Time For Goal Achievement: 12/03/20 Potential to Achieve Goals: Fair Progress towards PT goals: Progressing toward goals    Frequency    BID      PT Plan Current plan remains appropriate    Co-evaluation              AM-PAC PT "6 Clicks" Mobility   Outcome Measure  Help needed turning from your back to your side while in a flat bed without using bedrails?: A Lot Help needed moving from lying on your back to sitting on the side of a flat bed without using bedrails?: A Lot Help needed moving to and from a bed to a  chair (including a wheelchair)?: A Little Help needed standing up from a chair using your arms (e.g., wheelchair or bedside chair)?: A Little Help needed to walk in hospital room?: A Little Help needed climbing 3-5 steps with a railing? : Total 6 Click Score: 14    End of Session Equipment Utilized During Treatment: Gait belt Activity Tolerance: Patient limited by pain Patient left: with call bell/phone within reach;with SCD's reapplied;in bed;with bed alarm set (hip abd pillow in place) Nurse Communication: Mobility status PT Visit Diagnosis: Unsteadiness on feet (R26.81);Difficulty in walking, not elsewhere classified (R26.2);Muscle weakness (generalized) (M62.81);Pain Pain - Right/Left: Left Pain - part of body: Hip     Time: 7628-3151 PT Time Calculation (min) (ACUTE ONLY): 39 min  Charges:  $Gait Training: 38-52 mins $Therapeutic Exercise: 8-22 mins                     Gwenlyn Saran, PT, DPT 11/21/20, 5:08 PM

## 2020-11-21 NOTE — Evaluation (Signed)
Occupational Therapy Evaluation Patient Details Name: Linda Bradley MRN: [id number] DOB: [birth year] Today's Date: 11/21/2020    History of Present Illness Pt is a 79yo F admitted to [facility] on 11/17/20 for acute pain and disability. Pt began to experience increasing LLE pain that became dibilitating to the point where she was unable to stand or ambulate with AD. Pt with significant PMH including: anxiety, Pauci-immune Glomerulonephritis, HTN, CKD (stage IIIb), DDD, formainal stenosis, hx back sx, and cLBP. Imaging significant for: severe levoscoliosis with mod-advanced multilevel degenerative spondylosis and facet arthrosis, subacute bil sacral insufficiency fx, transverse subcapital fx of the L femoral neck with varus angulation, with fx of the L superior/inferior pubic rami possibly demonstrating healing. Pt s/p L hip unipolar hemiarthroplasty on 11/18/20 by Dr. Roland Rack and is WBAT on LLE.   Clinical Impression   Pt seen for OT evaluation this date, POD#3 from above surgery. Pt was Mod I in all ADLs prior to surgery, however occasionally using RW for mobility due to back pain; pt and son share IADL tasks. Pt reports significant decline in her health following a COVID dx in Jan 2021. Pt is eager to return to PLOF with improved safety and independence. Pt currently requires Min A for supine<sit and Mod A for sit<supine, with therapist lifting LLE back into bed. Pt reports no pain while lying in bed, some pain with movement. Pt requires increased time for bed mobility and transfers. Mod A for transfer to/from [facility]. Provided educ re: hip and WB precautions, use of AE for LB dressing, DME for mobility. Pt would benefit from additional instruction in self care skills and techniques to help maintain precautions with or without assistive devices to support recall and carryover prior to discharge. Recommend SNF upon discharge.      Follow Up Recommendations  SNF    Equipment Recommendations  3 in 1 bedside  commode    Recommendations for Other Services       Precautions / Restrictions Precautions Precautions: Posterior Hip;Fall Precaution Booklet Issued: Yes (comment) Restrictions Weight Bearing Restrictions: Yes      Mobility Bed Mobility Overal bed mobility: Needs Assistance Bed Mobility: Supine to Sit;Sit to Supine     Supine to sit: Min assist Sit to supine: Mod assist   General bed mobility comments: Pt required modA for assisting the LLE into the bed.    Transfers Overall transfer level: Needs assistance Equipment used: Rolling walker (2 wheeled) Transfers: Sit to/from Stand Sit to Stand: Min assist         General transfer comment: Pt able to come upright with minA utilizing verbal cues for hand placement on chair and RW.    Balance Overall balance assessment: Needs assistance Sitting-balance support: Feet supported Sitting balance-Leahy Scale: Good Sitting balance - Comments: Pt with good sitting balance at EOB with LE supported.   Standing balance support: Bilateral upper extremity supported;During functional activity Standing balance-Leahy Scale: Fair Standing balance comment: Pt required minA for standing and with mobility once in standing position.                           ADL either performed or assessed with clinical judgement   ADL Overall ADL's : Needs assistance/impaired                     Lower Body Dressing: Moderate assistance;With adaptive equipment;Adhering to hip precautions   Toilet Transfer: Moderate assistance;BSC;RW;Cueing for sequencing  Functional mobility during ADLs: Moderate assistance General ADL Comments: Requires extended time for all movements     Vision Baseline Vision/History: Wears glasses Wears Glasses: Reading only Patient Visual Report: No change from baseline       Perception     Praxis      Pertinent Vitals/Pain Pain Assessment: No/denies pain Faces Pain Scale: No  hurt Pain Location: pt reports no pain while lying in bed, "slight" pain with movement Pain Intervention(s): Limited activity within patient's tolerance;Repositioned;Monitored during session     Hand Dominance     Extremity/Trunk Assessment Upper Extremity Assessment Upper Extremity Assessment: Overall WFL for tasks assessed   Lower Extremity Assessment Lower Extremity Assessment: Generalized weakness   Cervical / Trunk Assessment Cervical / Trunk Assessment: Kyphotic   Communication Communication Communication: No difficulties   Cognition Arousal/Alertness: Awake/alert Behavior During Therapy: WFL for tasks assessed/performed Overall Cognitive Status: Within Functional Limits for tasks assessed                                 General Comments: A&O x4   General Comments       Exercises Other Exercises Other Exercises: Pt educ re: hip precautions, AE, AD, WBAT precautions. Bed mobility, balance, toilet transfer (to [facility]), LB dressing with AE   Shoulder Instructions      Home Living Family/patient expects to be discharged to:: Private residence Living Arrangements: Children Available Help at Discharge: Family;Available PRN/intermittently Type of Home: House Home Access: Stairs to enter [employer] of Steps: 5 Entrance Stairs-Rails: Right Home Layout: One level     Bathroom Shower/Tub: Walk-in shower;Door   Bathroom Toilet: Standard Bathroom Accessibility: No   Home Equipment: Environmental consultant - 2 wheels;Cane - single point;Shower seat   Additional Comments: lives with son, he works full-time days + 3 nights per week. Has a large dog at home who sometimes jumps on people      Prior Functioning/Environment Level of Independence: Needs assistance  Gait / Transfers Assistance Needed: Pt previously active with HHPT after back sx 89mo ago. Pt had just finished HHPT and had been ambulating without RW for ~1 week prior to hospitalization. ADL's /  Homemaking Assistance Needed: Pt reports being mod I/Ind with ADL's, but splits IADL's with son/grandson. Pt reports that her son will make her a sandwich while he's gone at work, and she "just needs to walk to go get it."   Comments: Ind amb community distances without an AD, no fall history, Ind with ADLs, walks daily for exercise        OT Problem List: Decreased strength;Decreased range of motion;Decreased activity tolerance;Decreased coordination;Impaired balance (sitting and/or standing);Decreased knowledge of use of DME or AE;Pain      OT Treatment/Interventions: Self-care/ADL training;DME and/or AE instruction;Therapeutic activities;Balance training;Therapeutic exercise;Energy conservation;Patient/family education    OT Goals(Current goals can be found in the care plan section) Acute Rehab OT Goals Patient Stated Goal: to go home OT Goal Formulation: With patient Time For Goal Achievement: 12/05/20 Potential to Achieve Goals: Good ADL Goals Pt Will Perform Lower Body Dressing: with modified independence;with adaptive equipment;sit to/from stand Pt Will Transfer to Toilet: stand pivot transfer;with set-up;with supervision (using LRAD) Pt Will Perform Toileting - Clothing Manipulation and hygiene: with supervision;sit to/from stand (using LRAD)  OT Frequency: Min 1X/week   Barriers to D/C: Inaccessible home environment;Decreased caregiver support  5 STE       Co-evaluation  AM-PAC OT "6 Clicks" Daily Activity     Outcome Measure Help from another person eating meals?: None Help from another person taking care of personal grooming?: None Help from another person toileting, which includes using toliet, bedpan, or urinal?: A Lot Help from another person bathing (including washing, rinsing, drying)?: A Little Help from another person to put on and taking off regular upper body clothing?: None Help from another person to put on and taking off regular lower body  clothing?: A Lot 6 Click Score: 19   End of Session Equipment Utilized During Treatment: Rolling walker  Activity Tolerance: Patient tolerated treatment well Patient left: in bed;with call bell/phone within reach  OT Visit Diagnosis: Unsteadiness on feet (R26.81);Muscle weakness (generalized) (M62.81);Pain Pain - Right/Left: Left Pain - part of body: Hip                Time: 0156-1537 OT Time Calculation (min): 47 min Charges:  OT General Charges $OT Visit: 1 Visit OT Evaluation $OT Eval Low Complexity: 1 Low OT Treatments $Self Care/Home Management : 38-52 mins  Josiah Lobo, PhD, MS, OTR/L ascom [phone] 11/21/20, 11:05 AM

## 2020-11-21 NOTE — Progress Notes (Signed)
Physical Therapy Treatment Patient Details Name: Linda Bradley MRN: [id number] DOB: 08-23-1941 Today's Date: 11/21/2020    History of Present Illness Pt is a 79yo F admitted to [facility] on 11/17/20 for acute pain and disability. Pt began to experience increasing LLE pain that became dibilitating to the point where she was unable to stand or ambulate with AD. Pt with significant PMH including: anxiety, Pauci-immune Glomerulonephritis, HTN, CKD (stage IIIb), DDD, formainal stenosis, hx back sx, and cLBP. Imaging significant for: severe levoscoliosis with mod-advanced multilevel degenerative spondylosis and facet arthrosis, subacute bil sacral insufficiency fx, transverse subcapital fx of the L femoral neck with varus angulation, with fx of the L superior/inferior pubic rami possibly demonstrating healing. Pt s/p L hip unipolar hemiarthroplasty on 11/18/20 by Dr. Roland Rack and is WBAT on LLE.    PT Comments    Pt received in supine position and agreeable to bed-level exercises only.  Pt able to perform bed-level exercises without much difficulty, requiring less assistance from therapist for SLR and hip abduction of the LLE.  Pt it making strength improvements, and will be assessed this afternoon for ambulation.  Current discharge plans to SNF remain appropriate at this time.  Pt will continue to benefit from skilled therapy in order to address deficits listed below.    Follow Up Recommendations  Supervision/Assistance - 24 hour;SNF     Equipment Recommendations  3in1 (PT)    Recommendations for Other Services Rehab consult;OT consult     Precautions / Restrictions Precautions Precautions: Posterior Hip;Fall Precaution Booklet Issued: No Precaution Comments: posterior hip precautions implemented for pain management. Restrictions Weight Bearing Restrictions: Yes Other Position/Activity Restrictions: hip abd brace donned in bed    Mobility  Bed Mobility Overal bed mobility: Needs  Assistance Bed Mobility: Supine to Sit;Sit to Supine     Supine to sit: Min assist Sit to supine: Mod assist   General bed mobility comments: Pt received supine in bed and declined getting out of bed for AM session.  Transfers Overall transfer level: Needs assistance Equipment used: Rolling walker (2 wheeled) Transfers: Sit to/from Stand Sit to Stand: Min assist         General transfer comment: Pt able to come upright with minA utilizing verbal cues for hand placement on chair and RW.  Ambulation/Gait                 Stairs             Wheelchair Mobility    Modified Rankin (Stroke Patients Only)       Balance Overall balance assessment: Needs assistance Sitting-balance support: Feet supported Sitting balance-Leahy Scale: Good Sitting balance - Comments: Not assessed as pt declined out of bed activities.   Standing balance support: Bilateral upper extremity supported;During functional activity Standing balance-Leahy Scale: Fair Standing balance comment: Not assessed as pt declined out of bed activities.                            Cognition Arousal/Alertness: Awake/alert Behavior During Therapy: WFL for tasks assessed/performed Overall Cognitive Status: Within Functional Limits for tasks assessed                                 General Comments: A&O x4      Exercises Total Joint Exercises Ankle Circles/Pumps: AROM;Strengthening;Both;10 reps;Supine Quad Sets: AROM;Strengthening;Both;10 reps;Supine Gluteal Sets: AROM;Strengthening;Both;10 reps;Supine Heel Slides: AROM;Strengthening;Both;10 reps;Supine  Hip ABduction/ADduction: AROM;Strengthening;Both;10 reps;Supine Straight Leg Raises: AROM;Strengthening;Right;AAROM;Left;10 reps;Supine Other Exercises Other Exercises: Pt educ re: hip precautions, AE, AD, WBAT precautions. Bed mobility, balance, toilet transfer (to [facility]), LB dressing with AE    General Comments         Pertinent Vitals/Pain Pain Assessment: Faces Faces Pain Scale: Hurts even more Pain Location: Pt reports no pain when lying in bed, but grimacing face when performing movement in bed. Pain Descriptors / Indicators: Grimacing;Guarding Pain Intervention(s): Limited activity within patient's tolerance;Monitored during session;Repositioned    Home Living Family/patient expects to be discharged to:: Private residence Living Arrangements: Children Available Help at Discharge: Family;Available PRN/intermittently Type of Home: House Home Access: Stairs to enter Entrance Stairs-Rails: Right Home Layout: One level Home Equipment: Walker - 2 wheels;Cane - single point;Shower seat Additional Comments: lives with son, he works full-time days + 3 nights per week. Has a large dog at home who sometimes jumps on people    Prior Function Level of Independence: Needs assistance  Gait / Transfers Assistance Needed: Pt previously active with HHPT after back sx 24mo ago. Pt had just finished HHPT and had been ambulating without RW for ~1 week prior to hospitalization. ADL's / Homemaking Assistance Needed: Pt reports being mod I/Ind with ADL's, but splits IADL's with son/grandson. Pt reports that her son will make her a sandwich while he's gone at work, and she "just needs to walk to go get it." Comments: Ind amb community distances without an AD, no fall history, Ind with ADLs, walks daily for exercise   PT Goals (current goals can now be found in the care plan section) Acute Rehab PT Goals Patient Stated Goal: to go home PT Goal Formulation: With patient Time For Goal Achievement: 12/03/20 Potential to Achieve Goals: Fair Progress towards PT goals: Progressing toward goals    Frequency    BID      PT Plan Current plan remains appropriate    Co-evaluation              AM-PAC PT "6 Clicks" Mobility   Outcome Measure  Help needed turning from your back to your side while in a flat bed  without using bedrails?: A Lot Help needed moving from lying on your back to sitting on the side of a flat bed without using bedrails?: A Lot Help needed moving to and from a bed to a chair (including a wheelchair)?: A Little Help needed standing up from a chair using your arms (e.g., wheelchair or bedside chair)?: A Little Help needed to walk in hospital room?: A Little Help needed climbing 3-5 steps with a railing? : Total 6 Click Score: 14    End of Session   Activity Tolerance: Patient limited by pain Patient left: with call bell/phone within reach;with SCD's reapplied;in bed;with bed alarm set (hip abd pillow in place) Nurse Communication: Mobility status PT Visit Diagnosis: Unsteadiness on feet (R26.81);Difficulty in walking, not elsewhere classified (R26.2);Muscle weakness (generalized) (M62.81);Pain Pain - Right/Left: Left Pain - part of body: Hip     Time: 1131-1146 PT Time Calculation (min) (ACUTE ONLY): 15 min  Charges:  $Therapeutic Exercise: 8-22 mins                     Gwenlyn Saran, PT, DPT 11/21/20, 1:56 PM

## 2020-11-22 DIAGNOSIS — S32592A Other specified fracture of left pubis, initial encounter for closed fracture: Secondary | ICD-10-CM | POA: Diagnosis not present

## 2020-11-22 DIAGNOSIS — S72002A Fracture of unspecified part of neck of left femur, initial encounter for closed fracture: Secondary | ICD-10-CM | POA: Diagnosis not present

## 2020-11-22 DIAGNOSIS — I10 Essential (primary) hypertension: Secondary | ICD-10-CM | POA: Diagnosis not present

## 2020-11-22 LAB — RESP PANEL BY RT-PCR (FLU A&B, COVID) ARPGX2
Influenza A by PCR: NEGATIVE
Influenza B by PCR: NEGATIVE
SARS Coronavirus 2 by RT PCR: NEGATIVE

## 2020-11-22 LAB — BASIC METABOLIC PANEL
Anion gap: 9 (ref 5–15)
BUN: 29 mg/dL — ABNORMAL HIGH (ref 8–23)
CO2: 26 mmol/L (ref 22–32)
Calcium: 8.7 mg/dL — ABNORMAL LOW (ref 8.9–10.3)
Chloride: 100 mmol/L (ref 98–111)
Creatinine, Ser: 1.13 mg/dL — ABNORMAL HIGH (ref 0.44–1.00)
GFR, Estimated: 50 mL/min — ABNORMAL LOW (ref >60)
Glucose, Bld: 92 mg/dL (ref 70–99)
Potassium: 4.7 mmol/L (ref 3.5–5.1)
Sodium: 135 mmol/L (ref 135–145)

## 2020-11-22 NOTE — Progress Notes (Signed)
Physical Therapy Treatment Patient Details Name: Linda Bradley MRN: [id number] DOB: [birth year] Today's Date: 11/22/2020    History of Present Illness Pt is a 79yo F admitted to [facility] on 11/17/20 for acute pain and disability. Pt began to experience increasing LLE pain that became dibilitating to the point where she was unable to stand or ambulate with AD. Pt with significant PMH including: anxiety, Pauci-immune Glomerulonephritis, HTN, CKD (stage IIIb), DDD, formainal stenosis, hx back sx, and cLBP. Imaging significant for: severe levoscoliosis with mod-advanced multilevel degenerative spondylosis and facet arthrosis, subacute bil sacral insufficiency fx, transverse subcapital fx of the L femoral neck with varus angulation, with fx of the L superior/inferior pubic rami possibly demonstrating healing. Pt s/p L hip unipolar hemiarthroplasty on 11/18/20 by Dr. Roland Rack and is WBAT on LLE.    PT Comments    Pt was long sitting in bed upon arriving. She agrees to PT session and is cooperative and pleasant throughout. Was able to perform exercises listed below. Demonstrated improved abilities with getting OOB. Able to stand to RW with min assist only. Upon standing pt requested to have a BM. BSC placed behind pt. RN staff aware pt was on BSC. Therapist to return at later time to progress ambulation. Pt had call bell in hand at conclusion of session.    Follow Up Recommendations  SNF     Equipment Recommendations  3in1 (PT)    Recommendations for Other Services       Precautions / Restrictions Precautions Precautions: Posterior Hip;Fall Precaution Booklet Issued: No Restrictions Weight Bearing Restrictions: Yes LLE Weight Bearing: Weight bearing as tolerated    Mobility  Bed Mobility Overal bed mobility: Needs Assistance Bed Mobility: Supine to Sit     Supine to sit: Min assist        Transfers Overall transfer level: Needs assistance Equipment used: Rolling walker (2  wheeled) Transfers: Sit to/from Stand Sit to Stand: Min assist            Ambulation/Gait Ambulation/Gait assistance: Min guard Gait Distance (Feet): 50 Feet Assistive device: Rolling walker (2 wheeled) Gait Pattern/deviations: Step-to pattern;Decreased step length - right;Decreased stance time - left;Decreased stride length Gait velocity: decreased   General Gait Details: Pt stood and took steps but then requested to have BM. BSC placed behind pt and RN staff notified.         Balance Overall balance assessment: Needs assistance Sitting-balance support: Feet supported Sitting balance-Leahy Scale: Good     Standing balance support: Bilateral upper extremity supported;During functional activity Standing balance-Leahy Scale: Good Standing balance comment: reliant on BUE support on RW       Cognition Arousal/Alertness: Awake/alert Behavior During Therapy: WFL for tasks assessed/performed Overall Cognitive Status: Within Functional Limits for tasks assessed        General Comments: A&O x4      Exercises Total Joint Exercises Ankle Circles/Pumps: AROM;Strengthening;Both;10 reps;Supine Quad Sets: AROM;Strengthening;Both;10 reps;Supine Gluteal Sets: AROM;Strengthening;Both;10 reps;Supine Heel Slides: AROM;Strengthening;Both;10 reps;Supine Hip ABduction/ADduction: AROM;Strengthening;Both;10 reps;Supine Straight Leg Raises: AAROM;5 reps    General Comments        Pertinent Vitals/Pain Pain Assessment: No/denies pain Pain Score: 0-No pain Faces Pain Scale: No hurt Pain Intervention(s): Monitored during session;Premedicated before session;Limited activity within patient's tolerance;Repositioned           PT Goals (current goals can now be found in the care plan section) Acute Rehab PT Goals Patient Stated Goal: go home after rehab Progress towards PT goals: Progressing toward goals  Frequency    BID      PT Plan Current plan remains appropriate     Co-evaluation              AM-PAC PT "6 Clicks" Mobility   Outcome Measure  Help needed turning from your back to your side while in a flat bed without using bedrails?: A Lot Help needed moving from lying on your back to sitting on the side of a flat bed without using bedrails?: A Lot Help needed moving to and from a bed to a chair (including a wheelchair)?: A Little Help needed standing up from a chair using your arms (e.g., wheelchair or bedside chair)?: A Little Help needed to walk in hospital room?: A Little Help needed climbing 3-5 steps with a railing? : A Lot 6 Click Score: 15    End of Session Equipment Utilized During Treatment: Gait belt Activity Tolerance: Patient tolerated treatment well Patient left: Other (comment) (on [facility] with RN staff aware and call bell in reach) Nurse Communication: Mobility status PT Visit Diagnosis: Unsteadiness on feet (R26.81);Difficulty in walking, not elsewhere classified (R26.2);Muscle weakness (generalized) (M62.81);Pain Pain - Right/Left: Left Pain - part of body: Hip     Time: 7510-2585 PT Time Calculation (min) (ACUTE ONLY): 25 min  Charges:  $Therapeutic Exercise: 8-22 mins $Therapeutic Activity: 8-22 mins                     Julaine Fusi PTA 11/22/20, 11:27 AM

## 2020-11-22 NOTE — Progress Notes (Signed)
Physical Therapy Treatment Patient Details Name: Linda Bradley MRN: [id number] DOB: 1941/03/01 Today's Date: 11/22/2020    History of Present Illness Pt is a 79yo F admitted to [facility] on 11/17/20 for acute pain and disability. Pt began to experience increasing LLE pain that became dibilitating to the point where she was unable to stand or ambulate with AD. Pt with significant PMH including: anxiety, Pauci-immune Glomerulonephritis, HTN, CKD (stage IIIb), DDD, formainal stenosis, hx back sx, and cLBP. Imaging significant for: severe levoscoliosis with mod-advanced multilevel degenerative spondylosis and facet arthrosis, subacute bil sacral insufficiency fx, transverse subcapital fx of the L femoral neck with varus angulation, with fx of the L superior/inferior pubic rami possibly demonstrating healing. Pt s/p L hip unipolar hemiarthroplasty on 11/18/20 by Dr. Roland Rack and is WBAT on LLE.    PT Comments    Pt was sitting in recliner upon arriving. She is eager to get back to bed. Requested not to ambulate but was willing to performing there ex. She was able to perform HEP exercises in bed with minimal assistance. Overall pt is progressing towards all acute PT goals. Will benefit from SNF at [place] to address deficits and improve safety+ independence.    Follow Up Recommendations  SNF     Equipment Recommendations  Other (comment);3in1 (PT);Rolling walker with 5" wheels (pediatric RW)    Recommendations for Other Services       Precautions / Restrictions Precautions Precautions: Posterior Hip;Fall Precaution Booklet Issued: Yes (comment) Precaution Comments: posterior hip precautions implemented for pain management. Restrictions Weight Bearing Restrictions: Yes LLE Weight Bearing: Weight bearing as tolerated    Mobility  Bed Mobility Overal bed mobility: Needs Assistance Bed Mobility: Sit to Supine       Sit to supine: Min assist   General bed mobility comments: Min assist required to  return to supine from EOB short sit. Vcs throughout for technique improvments  Transfers Overall transfer level: Needs assistance Equipment used: Rolling walker (2 wheeled) Transfers: Sit to/from Stand Sit to Stand: Min guard         General transfer comment: CGA for safety with vcs for technique improvements  Ambulation/Gait Ambulation/Gait assistance: Min guard Gait Distance (Feet): 5 Feet Assistive device: Rolling walker (2 wheeled) Gait Pattern/deviations: Step-to pattern;Ataxic Gait velocity: decreased   General Gait Details: pt requested not to ambulate far distances 2/2 to fatigue. only from recliner back to bed        Balance Overall balance assessment: Needs assistance Sitting-balance support: Feet supported Sitting balance-Leahy Scale: Good     Standing balance support: Bilateral upper extremity supported;During functional activity Standing balance-Leahy Scale: Good Standing balance comment: reliant on BUE support on RW       Cognition Arousal/Alertness: Awake/alert Behavior During Therapy: [facility] for tasks assessed/performed Overall Cognitive Status: Within Functional Limits for tasks assessed    General Comments: A&O x4      Exercises Total Joint Exercises Ankle Circles/Pumps: AROM;Strengthening;Both;10 reps;Supine Quad Sets: AROM;Strengthening;Both;10 reps;Supine Gluteal Sets: AROM;Strengthening;Both;10 reps;Supine Towel Squeeze: AROM;10 reps Heel Slides: AROM;10 reps Hip ABduction/ADduction: AAROM;10 reps Straight Leg Raises: AAROM;10 reps        Pertinent Vitals/Pain Pain Assessment: No/denies pain Pain Score: 0-No pain           PT Goals (current goals can now be found in the care plan section) Acute Rehab PT Goals Patient Stated Goal: go home after rehab Progress towards PT goals: Progressing toward goals    Frequency    BID  PT Plan Current plan remains appropriate       AM-PAC PT "6 Clicks" Mobility   Outcome  Measure  Help needed turning from your back to your side while in a flat bed without using bedrails?: A Little Help needed moving from lying on your back to sitting on the side of a flat bed without using bedrails?: A Little Help needed moving to and from a bed to a chair (including a wheelchair)?: A Lot Help needed standing up from a chair using your arms (e.g., wheelchair or bedside chair)?: A Little Help needed to walk in hospital room?: A Little Help needed climbing 3-5 steps with a railing? : A Lot 6 Click Score: 16    End of Session Equipment Utilized During Treatment: Gait belt Activity Tolerance: Patient tolerated treatment well Patient left: in bed;with call bell/phone within reach;with bed alarm set;with nursing/sitter in room Nurse Communication: Mobility status PT Visit Diagnosis: Unsteadiness on feet (R26.81);Difficulty in walking, not elsewhere classified (R26.2);Muscle weakness (generalized) (M62.81);Pain Pain - Right/Left: Left Pain - part of body: Hip     Time: 1346-1405 PT Time Calculation (min) (ACUTE ONLY): 19 min  Charges:  $Therapeutic Exercise: 8-22 mins                     Julaine Fusi PTA 11/22/20, 2:25 PM

## 2020-11-22 NOTE — Progress Notes (Addendum)
Progress Note    Linda Bradley  ZLD:[id number] DOB: [birth year]  DOA: 11/17/2020 PCP: Lynnell Jude, MD      Brief Narrative:    Medical records reviewed and are as summarized below:  Linda Bradley is a 79 y.o. 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  There was no history of fall or trauma.  She was found to have left superior and inferior pubic rami fractures and closed left hip fracture suspected to be due to pathologic fracture.  She was seen in consultation by orthopedic surgeon and she underwent left hip unipolar hemiarthroplasty on 11/18/2020.    Assessment/Plan:   Principal Problem:   Closed left hip fracture, initial encounter [facility]) Active Problems:   Essential hypertension   Hyponatremia   Pauci-immune Glomerulonephritis   On long term prednisone therapy   Stage 3b chronic kidney disease (HCC)   Chronic back pain   Leukocytosis   Closed fracture of multiple pubic rami, left, initial encounter ([place])   Closed intertrochanteric fracture of left femur (HCC)   Nutrition Problem: Increased nutrient needs Etiology: post-op healing  Signs/Symptoms: estimated needs   Body mass index is 28.72 kg/m.    Closed left hip fracture and left superior and inferior pubic rami fracture suspected to be due to pathologic fracture: s/p left hip arthroplasty on 11/18/2020.  Analgesics as needed for pain.  Continue PT and OT.  PT recommends discharge to SNF.  Hyponatremia: Improved  CKD stage IIIb, pauci-immune glomerulonephritis from April 2021: Creatinine is at baseline.  Continue  long-term prednisone.  Leukocytosis: Improving.  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  This may be partly due to steroids.  Hypertension: Continue antihypertensives  History of lumbar radiculopathy requiring epidural steroid injection/chronic degenerative disc disease: Analgesics as needed for pain.   Diet Order            Diet regular Room service appropriate? Yes; Fluid consistency: Thin  Diet effective now                    Consultants:  Orthopedic surgeon  Procedures:  Left hip arthroplasty 11/18/2020    Medications:   . amLODipine  5 mg Oral Daily  . atenolol  12.5 mg Oral BID  . cloNIDine  0.2 mg Oral TID  . docusate sodium  100 mg Oral BID  . enoxaparin (LOVENOX) injection  40 mg Subcutaneous Q24H  . furosemide  20 mg Oral Once per day on Tue Thu Sat  . losartan  100 mg Oral Daily  . predniSONE  10 mg Oral Daily  . sodium chloride  1 g Oral BID WC  . spironolactone  25 mg Oral Daily  . cyanocobalamin  1,000 mcg Oral Daily   Continuous Infusions: . sodium chloride Stopped (11/21/20 2032)     Anti-infectives (From admission, onward)   Start     Dose/Rate Route Frequency Ordered Stop   11/18/20 1630  ceFAZolin (ANCEF) IVPB 2g/100 mL premix        2 g 200 mL/hr over 30 Minutes Intravenous Every 6 hours 11/18/20 1629 11/19/20 0436   11/18/20 0910  ceFAZolin (ANCEF) 2-4  GM/100ML-% IVPB       Note to Pharmacy: Register, Santiago Glad   : cabinet override      11/18/20 0910 11/18/20 1706   11/18/20 0312  ceFAZolin (ANCEF) IVPB 2g/100 mL premix        2 g 200 mL/hr over 30 Minutes Intravenous 30 min pre-op 11/18/20 0314 11/18/20 0944             Family Communication/Anticipated D/C date and plan/Code Status   DVT prophylaxis: enoxaparin (LOVENOX) injection 40 mg Start: 11/19/20 0800 SCDs Start: 11/18/20 1114 Place TED hose Start: 11/18/20 1114     Code Status: Full Code  Family Communication: None Disposition Plan:    Status is:  Inpatient  Remains inpatient appropriate because:Unsafe d/c plan   Dispo: The patient is from: Home              Anticipated d/c is to: SNF              Anticipated d/c date is: 1 day              Patient currently is medically stable to d/c.           Subjective:   Interval events noted.  She was able to work with physical therapist today.  Left hip pain is tolerable.  Objective:    Vitals:   11/22/20 0457 11/22/20 0749 11/22/20 0751 11/22/20 1203  BP: (!) 148/81 (!) 165/87 136/78 124/67  Pulse: 85 94 93 90  Resp: 16 18 18 16   Temp: 97.9 F (36.6 C) 98.3 F (36.8 C) 98.3 F (36.8 C) 98.2 F (36.8 C)  TempSrc: Oral   Oral  SpO2: 97% 98% 98% 100%  Weight:      Height:       No data found.   Intake/Output Summary (Last 24 hours) at 11/22/2020 1417 Last data filed at 11/22/2020 1006 Gross per 24 hour  Intake 240 ml  Output 1950 ml  Net -1710 ml   Filed Weights   11/17/20 1814  Weight: 66.7 kg    Exam:  GEN: NAD SKIN: Warm and dry EYES: EOMI ENT: MMM CV: RRR PULM: CTA B ABD: soft, ND, NT, +BS CNS: AAO x 3, non focal EXT: Mild left hip swelling and tenderness.  Dressing on left hip surgical wound is intact.   Data Reviewed:   I have personally reviewed following labs and imaging studies:  Labs: Labs show the following:   Basic Metabolic Panel: Recent Labs  Lab 11/18/20 0415 11/18/20 0415 11/19/20 0605 11/19/20 0605 11/20/20 0546 11/20/20 0546 11/21/20 0633 11/22/20 0449  NA 121*  --  126*  --  135  --  134* 135  K 5.1   < > 4.3   < > 5.3*   < > 4.3 4.7  CL 88*  --  95*  --  102  --  100 100  CO2 22  --  21*  --  26  --  26 26  GLUCOSE 168*  --  148*  --  101*  --  90 92  BUN 24*  --  21  --  24*  --  30* 29*  CREATININE 1.20*  --  1.56*  --  1.35*  --  1.19* 1.13*  CALCIUM 9.3  --  8.9  --  8.9  --  8.9 8.7*   < > = values in this interval not displayed.   GFR Estimated Creatinine Clearance: 35 mL/min (A) (by C-G formula  based  on SCr of 1.13 mg/dL (H)). Liver Function Tests: No results for input(s): AST, ALT, ALKPHOS, BILITOT, PROT, ALBUMIN in the last 168 hours. No results for input(s): LIPASE, AMYLASE in the last 168 hours. No results for input(s): AMMONIA in the last 168 hours. Coagulation profile No results for input(s): INR, PROTIME in the last 168 hours.  CBC: Recent Labs  Lab 11/17/20 2217 11/18/20 0415 11/19/20 0605 11/20/20 0546 11/21/20 0633  WBC 36.0* 24.6* 25.9* 18.1* 16.5*  NEUTROABS 31.0*  --   --   --  11.1*  HGB 16.0* 15.4* 13.6 12.1 12.1  HCT 44.3 41.8 37.8 35.3* 34.7*  MCV 85.4 84.4 86.3 91.5 89.4  PLT 397 328 293 263 278   Cardiac Enzymes: No results for input(s): CKTOTAL, CKMB, CKMBINDEX, TROPONINI in the last 168 hours. BNP (last 3 results) No results for input(s): PROBNP in the last 8760 hours. CBG: No results for input(s): GLUCAP in the last 168 hours. D-Dimer: No results for input(s): DDIMER in the last 72 hours. Hgb A1c: No results for input(s): HGBA1C in the last 72 hours. Lipid Profile: No results for input(s): CHOL, HDL, LDLCALC, TRIG, CHOLHDL, LDLDIRECT in the last 72 hours. Thyroid function studies: No results for input(s): TSH, T4TOTAL, T3FREE, THYROIDAB in the last 72 hours.  Invalid input(s): FREET3 Anemia work up: No results for input(s): VITAMINB12, FOLATE, FERRITIN, TIBC, IRON, RETICCTPCT in the last 72 hours. Sepsis Labs: Recent Labs  Lab 11/18/20 0415 11/19/20 0605 11/20/20 0546 11/21/20 0633  WBC 24.6* 25.9* 18.1* 16.5*    Microbiology Recent Results (from the past 240 hour(s))  Resp Panel by RT-PCR (Flu A&B, Covid) Nasopharyngeal Swab     Status: None   Collection Time: 11/17/20 10:17 PM   Specimen: Nasopharyngeal Swab; Nasopharyngeal(NP) swabs in vial transport medium  Result Value Ref Range Status   SARS Coronavirus 2 by RT PCR NEGATIVE NEGATIVE Final    Comment: (NOTE) SARS-CoV-2 target nucleic acids are NOT DETECTED.  The SARS-CoV-2  RNA is generally detectable in upper respiratory specimens during the acute phase of infection. The lowest concentration of SARS-CoV-2 viral copies this assay can detect is 138 copies/mL. A negative result does not preclude SARS-Cov-2 infection and should not be used as the sole basis for treatment or other patient management decisions. A negative result may occur with  improper specimen collection/handling, submission of specimen other than nasopharyngeal swab, presence of viral mutation(s) within the areas targeted by this assay, and inadequate number of viral copies(<138 copies/mL). A negative result must be combined with clinical observations, patient history, and epidemiological information. The expected result is Negative.  Fact Sheet for Patients:  EntrepreneurPulse.com.au  Fact Sheet for Healthcare Providers:  IncredibleEmployment.be  This test is no t yet approved or cleared by the [place] FDA and  has been authorized for detection and/or diagnosis of SARS-CoV-2 by FDA under an Emergency Use Authorization (EUA). This EUA will remain  in effect (meaning this test can be used) for the duration of the COVID-19 declaration under Section 564(b)(1) of the Act, 21 U.S.C.section 360bbb-3(b)(1), unless the authorization is terminated  or revoked sooner.       Influenza A by PCR NEGATIVE NEGATIVE Final   Influenza B by PCR NEGATIVE NEGATIVE Final    Comment: (NOTE) The Xpert Xpress SARS-CoV-2/FLU/RSV plus assay is intended as an aid in the diagnosis of influenza from Nasopharyngeal swab specimens and should not be used as a sole basis for treatment. Nasal washings and aspirates are unacceptable for  Xpert Xpress SARS-CoV-2/FLU/RSV testing.  Fact Sheet for Patients: EntrepreneurPulse.com.au  Fact Sheet for Healthcare Providers: IncredibleEmployment.be  This test is not yet approved or cleared by the  [place] FDA and has been authorized for detection and/or diagnosis of SARS-CoV-2 by FDA under an Emergency Use Authorization (EUA). This EUA will remain in effect (meaning this test can be used) for the duration of the COVID-19 declaration under Section 564(b)(1) of the Act, 21 U.S.C. section 360bbb-3(b)(1), unless the authorization is terminated or revoked.  Performed at [facility], [address] [place], [place] [address]   Resp Panel by RT-PCR (Flu A&B, Covid) Nasopharyngeal Swab     Status: None   Collection Time: 11/22/20  9:38 AM   Specimen: Nasopharyngeal Swab; Nasopharyngeal(NP) swabs in vial transport medium  Result Value Ref Range Status   SARS Coronavirus 2 by RT PCR NEGATIVE NEGATIVE Final    Comment: (NOTE) SARS-CoV-2 target nucleic acids are NOT DETECTED.  The SARS-CoV-2 RNA is generally detectable in upper respiratory specimens during the acute phase of infection. The lowest concentration of SARS-CoV-2 viral copies this assay can detect is 138 copies/mL. A negative result does not preclude SARS-Cov-2 infection and should not be used as the sole basis for treatment or other patient management decisions. A negative result may occur with  improper specimen collection/handling, submission of specimen other than nasopharyngeal swab, presence of viral mutation(s) within the areas targeted by this assay, and inadequate number of viral copies(<138 copies/mL). A negative result must be combined with clinical observations, patient history, and epidemiological information. The expected result is Negative.  Fact Sheet for Patients:  EntrepreneurPulse.com.au  Fact Sheet for Healthcare Providers:  IncredibleEmployment.be  This test is no t yet approved or cleared by the [place] FDA and  has been authorized for detection and/or diagnosis of SARS-CoV-2 by FDA under an Emergency Use Authorization (EUA). This EUA will  remain  in effect (meaning this test can be used) for the duration of the COVID-19 declaration under Section 564(b)(1) of the Act, 21 U.S.C.section 360bbb-3(b)(1), unless the authorization is terminated  or revoked sooner.       Influenza A by PCR NEGATIVE NEGATIVE Final   Influenza B by PCR NEGATIVE NEGATIVE Final    Comment: (NOTE) The Xpert Xpress SARS-CoV-2/FLU/RSV plus assay is intended as an aid in the diagnosis of influenza from Nasopharyngeal swab specimens and should not be used as a sole basis for treatment. Nasal washings and aspirates are unacceptable for Xpert Xpress SARS-CoV-2/FLU/RSV testing.  Fact Sheet for Patients: EntrepreneurPulse.com.au  Fact Sheet for Healthcare Providers: IncredibleEmployment.be  This test is not yet approved or cleared by the [place] FDA and has been authorized for detection and/or diagnosis of SARS-CoV-2 by FDA under an Emergency Use Authorization (EUA). This EUA will remain in effect (meaning this test can be used) for the duration of the COVID-19 declaration under Section 564(b)(1) of the Act, 21 U.S.C. section 360bbb-3(b)(1), unless the authorization is terminated or revoked.  Performed at [facility], [address] [place], [place] [address]     Procedures and diagnostic studies:  No results found.             LOS: 4 days   Kamisha Ell  [facility]   Pager on [email or web address]. If 7PM-7AM, please contact night-coverage at [email or web address]     11/22/2020, 2:17 PM

## 2020-11-22 NOTE — Care Management Important Message (Signed)
Important Message  Patient Details  Name: Linda Bradley MRN: [id number] Date of Birth: August 23, 1941   Medicare Important Message Given:  Yes     Juliann Pulse A Barclay Lennox 11/22/2020, 11:54 AM

## 2020-11-22 NOTE — Progress Notes (Signed)
Subjective: 4 Days Post-Op Procedure(s) (LRB): ARTHROPLASTY BIPOLAR HIP (HEMIARTHROPLASTY) (Left) Patient reports pain as mild.   Patient is well with no complaints. Negative for chest pain and shortness of breath Fever: no Gastrointestinal:Negative for nausea and vomiting.  Patient had a BM last night.  Objective: Vital signs in last 24 hours: Temp:  [97.5 F (36.4 C)-98.8 F (37.1 C)] 97.9 F (36.6 C) (12/01 0457) Pulse Rate:  [80-100] 85 (12/01 0457) Resp:  [16] 16 (12/01 0457) BP: (114-148)/(71-81) 148/81 (12/01 0457) SpO2:  [97 %-99 %] 97 % (12/01 0457)  Intake/Output from previous day:  Intake/Output Summary (Last 24 hours) at 11/22/2020 0740 Last data filed at 11/22/2020 0459 Gross per 24 hour  Intake 240 ml  Output 1950 ml  Net -1710 ml    Intake/Output this shift: No intake/output data recorded.  Labs: Recent Labs    11/20/20 0546 11/21/20 0633  HGB 12.1 12.1   Recent Labs    11/20/20 0546 11/21/20 0633  WBC 18.1* 16.5*  RBC 3.86* 3.88  HCT 35.3* 34.7*  PLT 263 278   Recent Labs    11/21/20 0633 11/22/20 0449  NA 134* 135  K 4.3 4.7  CL 100 100  CO2 26 26  BUN 30* 29*  CREATININE 1.19* 1.13*  GLUCOSE 90 92  CALCIUM 8.9 8.7*   No results for input(s): LABPT, INR in the last 72 hours.   EXAM General - Patient is Alert, Appropriate and Oriented Extremity - ABD soft Sensation intact distally Intact pulses distally Dorsiflexion/Plantar flexion intact Incision: scant drainage No cellulitis present Dressing/Incision -scant drainage.  Thigh is soft. No erythema. Motor Function - intact, moving foot and toes well on exam.   Past Medical History:  Diagnosis Date  . Anxiety disorder   . Dermatitis   . 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 Heart murmur   . Hypercholesterolemia   . Hypertension   . Lymphadenitis   . Renal disorder   . Skin cancer   . Spontaneous ecchymoses   . Vitamin D deficiency     Assessment/Plan: 4  Days Post-Op Procedure(s) (LRB): ARTHROPLASTY BIPOLAR HIP (HEMIARTHROPLASTY) (Left) Principal Problem:   Closed left hip fracture, initial encounter [facility]) Active Problems:   Essential hypertension   Hyponatremia   Pauci-immune Glomerulonephritis   On long term prednisone therapy   Stage 3b chronic kidney disease (HCC)   Chronic back pain   Leukocytosis   Closed fracture of multiple pubic rami, left, initial encounter ([place])   Closed intertrochanteric fracture of left femur (HCC)  Estimated body mass index is 28.72 kg/m as calculated from the following:   Height as of this encounter: 5' (1.524 m).   Weight as of this encounter: 66.7 kg. Up with therapy   Vitals stable this AM. Patient has had a BM. Plan for discharge to SNF when able.  Follow-up with [facility] orthopedics in 2 weeks Lovenox 40 mg subcu daily x14 days at discharge TED hose bilateral lower extremity x6 weeks  DVT Prophylaxis - Lovenox, Foot Pumps and TED hose Weight-Bearing as tolerated to Left leg  J. Cameron Proud, PA-C [facility] Orthopaedic Surgery 11/22/2020, 7:40 AM

## 2020-11-22 NOTE — TOC Progression Note (Signed)
Transition of Care [facility]) - Progression Note    Patient Details  Name: KAMDYN COVEL MRN: [id number] Date of Birth: [birth year]  Transition [facility]) CM/SW [employer], LCSW Phone Number: 11/22/2020, 4:04 PM  Clinical Narrative:    CSW met with patient at bedside to discuss discharge plan, as patient had been offered a bed at [facility] but refused stating she doesn't want to be in [place]. CSW offered list of SNF choices, but also explained there are only a select few that are in network with her insurance. Patient stated her preference is [facility] if they are in network and have a bed. CSW agreed to try there first. Bed offers came in this afternoon from the only two SNF's in network with [employer]. Patient had said she didn't think she wanted [facility], but not sure. [facility] agreed to start [place]. Patient will have to make a choice between these two. When Josem Kaufmann is approved for whatever her choice is, she should be ready for discharge.        Expected Discharge Plan and Services                                                 Social Determinants of Health (SDOH) Interventions    Readmission Risk Interventions No flowsheet data found.

## 2020-11-22 NOTE — Progress Notes (Signed)
Physical Therapy Treatment Patient Details Name: Linda Bradley MRN: [id number] DOB: Jun 26, 1941 Today's Date: 11/22/2020    History of Present Illness Pt is a 79yo F admitted to [facility] on 11/17/20 for acute pain and disability. Pt began to experience increasing LLE pain that became dibilitating to the point where she was unable to stand or ambulate with AD. Pt with significant PMH including: anxiety, Pauci-immune Glomerulonephritis, HTN, CKD (stage IIIb), DDD, formainal stenosis, hx back sx, and cLBP. Imaging significant for: severe levoscoliosis with mod-advanced multilevel degenerative spondylosis and facet arthrosis, subacute bil sacral insufficiency fx, transverse subcapital fx of the L femoral neck with varus angulation, with fx of the L superior/inferior pubic rami possibly demonstrating healing. Pt s/p L hip unipolar hemiarthroplasty on 11/18/20 by Dr. Roland Rack and is WBAT on LLE.    PT Comments    Pt had return to bed after BM with RN staff. Agreeable to OOB and ambulation currently. Pt is very pleasant and motivated. Was able to ambulate out to RN station and return with fatigue noted. HR and O 2 stable but pt profusely sweating. Overall tolerated ambulation well but will continue to benefit form SNF at DC to address deficits with strength, mobility, while assisting pt to PLOF. Acute PT will continue to follow per POC progressing as able.    Follow Up Recommendations  SNF     Equipment Recommendations  3in1 (PT)    Recommendations for Other Services       Precautions / Restrictions Precautions Precautions: Posterior Hip;Fall Precaution Booklet Issued: No Restrictions Weight Bearing Restrictions: Yes LLE Weight Bearing: Weight bearing as tolerated    Mobility  Bed Mobility Overal bed mobility: Needs Assistance Bed Mobility: Supine to Sit     Supine to sit: Min assist     General bed mobility comments: Pt was able to exit bed with incresaed time and min  assist  Transfers Overall transfer level: Needs assistance Equipment used: Rolling walker (2 wheeled) Transfers: Sit to/from Stand Sit to Stand: Min assist         General transfer comment: min assist for safety with vcs for hand placement and technique  Ambulation/Gait Ambulation/Gait assistance: Min guard Gait Distance (Feet): 50 Feet Assistive device: Rolling walker (2 wheeled) Gait Pattern/deviations: Step-to pattern;Decreased step length - right;Decreased stance time - left;Decreased stride length Gait velocity: decreased   General Gait Details: pt was able to ambulate to RN station and return with slow step to antalgic gait pattern. no LOB however pt does endorse fatigue with limited distances       Balance Overall balance assessment: Needs assistance Sitting-balance support: Feet supported Sitting balance-Leahy Scale: Good     Standing balance support: Bilateral upper extremity supported;During functional activity Standing balance-Leahy Scale: Good Standing balance comment: reliant on BUE support on RW      Cognition Arousal/Alertness: Awake/alert Behavior During Therapy: WFL for tasks assessed/performed Overall Cognitive Status: Within Functional Limits for tasks assessed      General Comments: A&O x4      Exercises Total Joint Exercises Ankle Circles/Pumps: AROM;Strengthening;Both;10 reps;Supine Quad Sets: AROM;Strengthening;Both;10 reps;Supine Gluteal Sets: AROM;Strengthening;Both;10 reps;Supine Heel Slides: AROM;Strengthening;Both;10 reps;Supine Hip ABduction/ADduction: AROM;Strengthening;Both;10 reps;Supine Straight Leg Raises: AAROM;5 reps    General Comments        Pertinent Vitals/Pain Pain Assessment: No/denies pain Pain Score: 0-No pain Faces Pain Scale: No hurt Pain Intervention(s): Limited activity within patient's tolerance;Monitored during session;Premedicated before session           PT Goals (current goals can  now be found in  the care plan section) Acute Rehab PT Goals Patient Stated Goal: go home after rehab Progress towards PT goals: Progressing toward goals    Frequency    BID      PT Plan Current plan remains appropriate       AM-PAC PT "6 Clicks" Mobility   Outcome Measure  Help needed turning from your back to your side while in a flat bed without using bedrails?: A Lot Help needed moving from lying on your back to sitting on the side of a flat bed without using bedrails?: A Lot Help needed moving to and from a bed to a chair (including a wheelchair)?: A Lot Help needed standing up from a chair using your arms (e.g., wheelchair or bedside chair)?: A Little Help needed to walk in hospital room?: A Little Help needed climbing 3-5 steps with a railing? : A Lot 6 Click Score: 14    End of Session Equipment Utilized During Treatment: Gait belt Activity Tolerance: Patient tolerated treatment well Patient left: in chair;with call bell/phone within reach;with chair alarm set;with SCD's reapplied Nurse Communication: Mobility status PT Visit Diagnosis: Unsteadiness on feet (R26.81);Difficulty in walking, not elsewhere classified (R26.2);Muscle weakness (generalized) (M62.81);Pain Pain - Right/Left: Left Pain - part of body: Hip     Time: 6979-4801 PT Time Calculation (min) (ACUTE ONLY): 20 min  Charges:  $Gait Training: 8-22 mins $Therapeutic Exercise: 8-22 mins $Therapeutic Activity: 8-22 mins                     Julaine Fusi PTA 11/22/20, 11:35 AM

## 2020-11-23 DIAGNOSIS — I10 Essential (primary) hypertension: Secondary | ICD-10-CM | POA: Diagnosis not present

## 2020-11-23 DIAGNOSIS — S72002A Fracture of unspecified part of neck of left femur, initial encounter for closed fracture: Secondary | ICD-10-CM | POA: Diagnosis not present

## 2020-11-23 LAB — CBC WITH DIFFERENTIAL/PLATELET
Abs Immature Granulocytes: 0.29 10*3/uL — ABNORMAL HIGH (ref 0.00–0.07)
Basophils Absolute: 0.1 10*3/uL (ref 0.0–0.1)
Basophils Relative: 0 %
Eosinophils Absolute: 0.5 10*3/uL (ref 0.0–0.5)
Eosinophils Relative: 4 %
HCT: 34.9 % — ABNORMAL LOW (ref 36.0–46.0)
Hemoglobin: 12.1 g/dL (ref 12.0–15.0)
Immature Granulocytes: 2 %
Lymphocytes Relative: 28 %
Lymphs Abs: 4 10*3/uL (ref 0.7–4.0)
MCH: 31.2 pg (ref 26.0–34.0)
MCHC: 34.7 g/dL (ref 30.0–36.0)
MCV: 89.9 fL (ref 80.0–100.0)
Monocytes Absolute: 1.5 10*3/uL — ABNORMAL HIGH (ref 0.1–1.0)
Monocytes Relative: 11 %
Neutro Abs: 7.7 10*3/uL (ref 1.7–7.7)
Neutrophils Relative %: 55 %
Platelets: 308 10*3/uL (ref 150–400)
RBC: 3.88 MIL/uL (ref 3.87–5.11)
RDW: 13.8 % (ref 11.5–15.5)
WBC: 14 10*3/uL — ABNORMAL HIGH (ref 4.0–10.5)
nRBC: 0 % (ref 0.0–0.2)

## 2020-11-23 LAB — BASIC METABOLIC PANEL
Anion gap: 8 (ref 5–15)
BUN: 32 mg/dL — ABNORMAL HIGH (ref 8–23)
CO2: 27 mmol/L (ref 22–32)
Calcium: 8.6 mg/dL — ABNORMAL LOW (ref 8.9–10.3)
Chloride: 100 mmol/L (ref 98–111)
Creatinine, Ser: 1.13 mg/dL — ABNORMAL HIGH (ref 0.44–1.00)
GFR, Estimated: 50 mL/min — ABNORMAL LOW (ref >60)
Glucose, Bld: 98 mg/dL (ref 70–99)
Potassium: 4.1 mmol/L (ref 3.5–5.1)
Sodium: 135 mmol/L (ref 135–145)

## 2020-11-23 MED ORDER — NEPRO/CARBSTEADY PO LIQD
237.0000 mL | ORAL | Status: DC
  Administered 2020-11-23: 237 mL via ORAL

## 2020-11-23 NOTE — Progress Notes (Signed)
Occupational Therapy Treatment Patient Details Name: Linda Bradley MRN: [id number] DOB: 1941-02-26 Today's Date: 11/23/2020    History of present illness Pt is a 79yo F admitted to [facility] on 11/17/20 for acute pain and disability. Pt began to experience increasing LLE pain that became dibilitating to the point where she was unable to stand or ambulate with AD. Pt with significant PMH including: anxiety, Pauci-immune Glomerulonephritis, HTN, CKD (stage IIIb), DDD, formainal stenosis, hx back sx, and cLBP. Imaging significant for: severe levoscoliosis with mod-advanced multilevel degenerative spondylosis and facet arthrosis, subacute bil sacral insufficiency fx, transverse subcapital fx of the L femoral neck with varus angulation, with fx of the L superior/inferior pubic rami possibly demonstrating healing. Pt s/p L hip unipolar hemiarthroplasty on 11/18/20 by Dr. Roland Rack and is WBAT on LLE.   OT comments  Pt seen for OT treatment this date to f/u re: ADLs/ADL mobility. Extensive time spent performing multiple aspects of self care ADLs this date. OT facilitates pt participation in sup to sit with CGA and pt demos G sitting balance. OT engages pt in UB bathing and dressing wtih SETUP and LB bathing/dressing with MOD A with AE, pt with good carryover of AE use, but still limtied/requiring assistance d/t pain. Pt requires CGA with sit to stand from bed with (youth) RW and requires CGA/MIN A for SPS transfers to/from [facility]. Pt requires MIN A for peri care. Pt returned to bed and requires MIN A for sit to sup to manage L LE. Pt faituged at end of session, but overall tolerated well. Pt progressed with fxl activity tolerance. Education ongoing re: THPs in relation to ADL tasks (use of AE PRN, ability to utilize R LE for dressing tasks-same precautions do not apply to R LE). Continue to anticipate pt will require SNF upon d/c.   Follow Up Recommendations  SNF    Equipment Recommendations  3 in 1 bedside commode     Recommendations for Other Services      Precautions / Restrictions Precautions Precautions: Posterior Hip;Fall Precaution Booklet Issued: Yes (comment) Precaution Comments: posterior hip precautions implemented for pain management. Restrictions Weight Bearing Restrictions: Yes LLE Weight Bearing: Weight bearing as tolerated Other Position/Activity Restrictions: hip abd brace donned in bed       Mobility Bed Mobility Overal bed mobility: Needs Assistance Bed Mobility: Sit to Supine     Supine to sit: Min guard;HOB elevated Sit to supine: Min assist   General bed mobility comments: MIN A to manage L LE back to bed  Transfers Overall transfer level: Needs assistance Equipment used: Rolling walker (2 wheeled) Transfers: Sit to/from [facility] Sit to Stand: Min guard Stand pivot transfers: Min guard;Min assist       General transfer comment: MIN verbal cues for safe hand placement    Balance Overall balance assessment: Needs assistance Sitting-balance support: Feet supported Sitting balance-Leahy Scale: Good     Standing balance support: Bilateral upper extremity supported;During functional activity Standing balance-Leahy Scale: Fair Standing balance comment: B UE support on RW                           ADL either performed or assessed with clinical judgement   ADL Overall ADL's : Needs assistance/impaired     Grooming: Set up;Sitting   Upper Body Bathing: Set up;Sitting   Lower Body Bathing: Moderate assistance;Sit to/from stand Lower Body Bathing Details (indicate cue type and reason): Pt able to perfrom anterior LB  bathing in standing with CGA with RW and SETUP Upper Body Dressing : Set up;Sitting   Lower Body Dressing: Moderate assistance;Sitting/lateral leans;With adaptive equipment Lower Body Dressing Details (indicate cue type and reason): OT educates pt re: hip precautions and how to utilize non-op LE. Pt with some  concerns r/t precautions and extensive time spent on this discussion. Pt requires AE for R LE and still requires MOD A. Toilet Transfer: Minimal assistance;Stand-pivot;BSC   Toileting- Clothing Manipulation and Hygiene: Minimal assistance;Moderate assistance;Sit to/from stand       Functional mobility during ADLs: Moderate assistance;Rolling walker (to take 3-4 small steps from [facility] to bed with RW)       Vision Baseline Vision/History: Wears glasses Wears Glasses: Reading only Patient Visual Report: No change from baseline     Perception     Praxis      Cognition Arousal/Alertness: Awake/alert Behavior During Therapy: WFL for tasks assessed/performed Overall Cognitive Status: Within Functional Limits for tasks assessed                                 General Comments: A&O x4        Exercises Other Exercises Other Exercises: OT facilitates pt participation in sup to sit with CGA and pt demos G sitting balance. OT engages pt in UB bathing and dressing wtih SETUP and LB bathing/dressing with MOD A with AE, pt with good carryover of AE use, but still limtied/requiring assistance d/t pain. Pt requires CGA with sit to stand from bed with (youth) RW and requires CGA/MIN A for SPS transfers to/from [facility]. Pt requires MIN A for peri care. Pt returned to bed and requires MIN A for sit to sup to manage L LE. Pt faituged at end of session, but overall tolerated well. Pt progressed with fxl activity tolerance. Education ongoing re: THPs in relation to ADL tasks. Continue to anticipate pt will require SNF upon d/c.   Shoulder Instructions       General Comments      Pertinent Vitals/ Pain       Pain Assessment: Faces Faces Pain Scale: Hurts a little bit Pain Location: some ligght grimacing with mobilization Pain Descriptors / Indicators: Grimacing;Guarding Pain Intervention(s): Limited activity within patient's tolerance;Monitored during session  Home Living                                           Prior Functioning/Environment              Frequency  Min 1X/week        Progress Toward Goals  OT Goals(current goals can now be found in the care plan section)  Progress towards OT goals: Progressing toward goals  Acute Rehab OT Goals Patient Stated Goal: go home after rehab OT Goal Formulation: With patient Time For Goal Achievement: 12/05/20 Potential to Achieve Goals: Good  Plan Discharge plan remains appropriate    Co-evaluation                 AM-PAC OT "6 Clicks" Daily Activity     Outcome Measure   Help from another person eating meals?: None Help from another person taking care of personal grooming?: None Help from another person toileting, which includes using toliet, bedpan, or urinal?: A Lot Help from another person bathing (including washing, rinsing, drying)?: A  Little Help from another person to put on and taking off regular upper body clothing?: None Help from another person to put on and taking off regular lower body clothing?: A Lot 6 Click Score: 19    End of Session Equipment Utilized During Treatment: Rolling walker  OT Visit Diagnosis: Unsteadiness on feet (R26.81);Muscle weakness (generalized) (M62.81);Pain Pain - Right/Left: Left Pain - part of body: Hip   Activity Tolerance Patient tolerated treatment well   Patient Left in bed;with call bell/phone within reach;with bed alarm set   Nurse Communication Mobility status        Time: 5486-8852 OT Time Calculation (min): 68 min  Charges: OT General Charges $OT Visit: 1 Visit OT Treatments $Self Care/Home Management : 38-52 mins $Therapeutic Activity: 23-37 mins  Gerrianne Scale, MS, OTR/L ascom [phone] 11/23/20, 4:00 PM

## 2020-11-23 NOTE — Progress Notes (Signed)
Subjective: 5 Days Post-Op Procedure(s) (LRB): ARTHROPLASTY BIPOLAR HIP (HEMIARTHROPLASTY) (Left) Patient reports pain as mild.   Patient is well with no complaints. Did have increased BP and tachycardia this AM, improved with meds. Negative for chest pain and shortness of breath Fever: no Gastrointestinal:Negative for nausea and vomiting. Patient has had a BM.  Objective: Vital signs in last 24 hours: Temp:  [97.9 F (36.6 C)-98.1 F (36.7 C)] 98.1 F (36.7 C) (12/02 0811) Pulse Rate:  [79-97] 94 (12/02 0811) Resp:  [16] 16 (12/02 0811) BP: (135-157)/(78-83) 150/83 (12/02 0811) SpO2:  [96 %-99 %] 99 % (12/02 0811)  Intake/Output from previous day:  Intake/Output Summary (Last 24 hours) at 11/23/2020 1258 Last data filed at 11/23/2020 1023 Gross per 24 hour  Intake 480 ml  Output 401 ml  Net 79 ml    Intake/Output this shift: Total I/O In: 240 [P.O.:240] Out: 1 [Stool:1]  Labs: Recent Labs    11/21/20 0633 11/23/20 0504  HGB 12.1 12.1   Recent Labs    11/21/20 0633 11/23/20 0504  WBC 16.5* 14.0*  RBC 3.88 3.88  HCT 34.7* 34.9*  PLT 278 308   Recent Labs    11/22/20 0449 11/23/20 0504  NA 135 135  K 4.7 4.1  CL 100 100  CO2 26 27  BUN 29* 32*  CREATININE 1.13* 1.13*  GLUCOSE 92 98  CALCIUM 8.7* 8.6*   No results for input(s): LABPT, INR in the last 72 hours.   EXAM General - Patient is Alert, Appropriate and Oriented Extremity - ABD soft Sensation intact distally Intact pulses distally Dorsiflexion/Plantar flexion intact Incision: moderate drainage No cellulitis present Dressing/Incision -Moderate serosanguinous drainage. Motor Function - intact, moving foot and toes well on exam.   Past Medical History:  Diagnosis Date  . Anxiety disorder   . Dermatitis   . 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 Heart murmur   . Hypercholesterolemia   . Hypertension   . Lymphadenitis   . Renal disorder   . Skin cancer   . Spontaneous  ecchymoses   . Vitamin D deficiency     Assessment/Plan: 5 Days Post-Op Procedure(s) (LRB): ARTHROPLASTY BIPOLAR HIP (HEMIARTHROPLASTY) (Left) Principal Problem:   Closed left hip fracture, initial encounter [facility]) Active Problems:   Essential hypertension   Hyponatremia   Pauci-immune Glomerulonephritis   On long term prednisone therapy   Stage 3b chronic kidney disease (HCC)   Chronic back pain   Leukocytosis   Closed fracture of multiple pubic rami, left, initial encounter ([place])   Closed intertrochanteric fracture of left femur (HCC)  Estimated body mass index is 28.72 kg/m as calculated from the following:   Height as of this encounter: 5' (1.524 m).   Weight as of this encounter: 66.7 kg. Up with therapy   Vitals stable, continue to monitor BP and HR. WBC continues to trend down, 14.0 today. Patient has had a BM. Plan for discharge to SNF when able.  Follow-up with [facility] orthopedics in 2 weeks Lovenox 40 mg subcu daily x14 days at discharge TED hose bilateral lower extremity x6 weeks  DVT Prophylaxis - Lovenox, Foot Pumps and TED hose Weight-Bearing as tolerated to Left leg  J. Cameron Proud, PA-C [facility] Orthopaedic Surgery 11/23/2020, 12:58 PM

## 2020-11-23 NOTE — TOC Progression Note (Signed)
Transition of Care [facility]) - Progression Note    Patient Details  Name: Linda Bradley MRN: [id number] Date of Birth: 1941-01-19  Transition of Care [facility]) CM/SW [place], LCSW Phone Number: 11/23/2020, 12:50 PM  Clinical Narrative:  CSW met with patient and presented bed offers. She accepted offer from [facility] because her son works in [place]. [facility] started insurance authorization yesterday and have approval today. They should have a bed for her tomorrow if stable. COVID test was done yesterday so she will not need another as long as she discharges by tomorrow. Patient has been vaccinated. Patient will update her children.   Expected Discharge Plan and Services                                                 Social Determinants of Health (SDOH) Interventions    Readmission Risk Interventions No flowsheet data found.

## 2020-11-23 NOTE — Progress Notes (Signed)
Physical Therapy Treatment Patient Details Name: Linda Bradley MRN: [id number] DOB: 1941/05/12 Today's Date: 11/23/2020    History of Present Illness Pt is a 79yo F admitted to [facility] on 11/17/20 for acute pain and disability. Pt began to experience increasing LLE pain that became dibilitating to the point where she was unable to stand or ambulate with AD. Pt with significant PMH including: anxiety, Pauci-immune Glomerulonephritis, HTN, CKD (stage IIIb), DDD, formainal stenosis, hx back sx, and cLBP. Imaging significant for: severe levoscoliosis with mod-advanced multilevel degenerative spondylosis and facet arthrosis, subacute bil sacral insufficiency fx, transverse subcapital fx of the L femoral neck with varus angulation, with fx of the L superior/inferior pubic rami possibly demonstrating healing. Pt s/p L hip unipolar hemiarthroplasty on 11/18/20 by Dr. Roland Rack and is WBAT on LLE.    PT Comments    Pt was long sitting in bed upon arriving. She agrees to PT session but does endorse fatigue this afternoon. BP slightly elevated 147/92. Was able to exit bed with CGA + vcs. Stood to [employer] and ambulated ~ 50 ft with RW + CGA. Pt did fatigue quicker this afternoon versus previously observed day prior. Was repositioned in recliner, performed exercise handout prior to conclusion of session. Pt has call bell in reach and RN aware of her abilities when therapist left the room. Continue to recommend DC to SNF to improve balance, strength, and safety    Follow Up Recommendations  SNF     Equipment Recommendations  3in1 (PT);Rolling walker with 5" wheels;Other (comment) (pediatric RW)    Recommendations for Other Services       Precautions / Restrictions Precautions Precautions: Posterior Hip;Fall Precaution Booklet Issued: Yes (comment) Precaution Comments: posterior hip precautions implemented for pain management. Restrictions Weight Bearing Restrictions: Yes LLE Weight Bearing: Weight bearing as  tolerated    Mobility  Bed Mobility Overal bed mobility: Needs Assistance Bed Mobility: Supine to Sit     Supine to sit: Min guard;HOB elevated Sit to supine: Min assist   General bed mobility comments: Increased time to perform with vcs for technique improvements. HOB was elevated with minimal use of bedrail  Transfers Overall transfer level: Needs assistance Equipment used: Rolling walker (2 wheeled) Transfers: Sit to/from Stand Sit to Stand: Min guard Stand pivot transfers: Min guard;Min assist       General transfer comment: CGA for safety  Ambulation/Gait Ambulation/Gait assistance: Min guard Gait Distance (Feet): 20 Feet Assistive device: Rolling walker (2 wheeled) Gait Pattern/deviations: Antalgic;Step-to pattern Gait velocity: decreased   General Gait Details: Pt did not want to ambulate further distances 2/2 to soreness and fatigue. BP 147/92       Balance Overall balance assessment: Needs assistance Sitting-balance support: Feet supported Sitting balance-Leahy Scale: Good     Standing balance support: Bilateral upper extremity supported;During functional activity Standing balance-Leahy Scale: Good Standing balance comment: reliant on BUE support on RW         Cognition Arousal/Alertness: Awake/alert Behavior During Therapy: WFL for tasks assessed/performed Overall Cognitive Status: Within Functional Limits for tasks assessed      General Comments: A&O x4      Exercises Other Exercises Other Exercises: OT facilitates pt participation in sup to sit with CGA and pt demos G sitting balance. OT engages pt in UB bathing and dressing wtih SETUP and LB bathing/dressing with MOD A with AE, pt with good carryover of AE use, but still limtied/requiring assistance d/t pain. Pt requires CGA with sit to stand from bed with (  youth) RW and requires CGA/MIN A for SPS transfers to/from [facility]. Pt requires MIN A for peri care. Pt returned to bed and requires MIN A for  sit to sup to manage L LE. Pt faituged at end of session, but overall tolerated well. Pt progressed with fxl activity tolerance. Education ongoing re: THPs in relation to ADL tasks. Continue to anticipate pt will require SNF upon d/c.        Pertinent Vitals/Pain Pain Assessment: No/denies pain Pain Score: 0-No pain Faces Pain Scale:  ("sore") Pain Location: some ligght grimacing with mobilization Pain Descriptors / Indicators: Grimacing;Guarding Pain Intervention(s): Limited activity within patient's tolerance;Premedicated before session;Repositioned;Monitored during session           PT Goals (current goals can now be found in the care plan section) Acute Rehab PT Goals Patient Stated Goal: go home after rehab Progress towards PT goals: Progressing toward goals    Frequency    BID      PT Plan Current plan remains appropriate       AM-PAC PT "6 Clicks" Mobility   Outcome Measure  Help needed turning from your back to your side while in a flat bed without using bedrails?: A Little Help needed moving from lying on your back to sitting on the side of a flat bed without using bedrails?: A Little Help needed moving to and from a bed to a chair (including a wheelchair)?: A Little Help needed standing up from a chair using your arms (e.g., wheelchair or bedside chair)?: A Little Help needed to walk in hospital room?: A Little Help needed climbing 3-5 steps with a railing? : A Lot 6 Click Score: 17    End of Session Equipment Utilized During Treatment: Gait belt Activity Tolerance: Patient tolerated treatment well Patient left: in chair;with call bell/phone within reach;with chair alarm set Nurse Communication: Mobility status PT Visit Diagnosis: Unsteadiness on feet (R26.81);Difficulty in walking, not elsewhere classified (R26.2);Muscle weakness (generalized) (M62.81);Pain Pain - Right/Left: Left Pain - part of body: Hip     Time: 0045-9977 PT Time Calculation (min)  (ACUTE ONLY): 17 min  Charges:  $Therapeutic Activity: 8-22 mins                     Julaine Fusi PTA 11/23/20, 5:45 PM

## 2020-11-23 NOTE — Progress Notes (Signed)
Progress Note    [name]  DSK:[id number] DOB: 03-27-1941  DOA: 11/17/2020 PCP: Lynnell Jude, MD      Brief Narrative:    Medical records reviewed and are as summarized below:  Linda Bradley is a 79 y.o. 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  There was no history of fall or trauma.  She was found to have left superior and inferior pubic rami fractures and closed left hip fracture suspected to be due to pathologic fracture.  She was seen in consultation by orthopedic surgeon and she underwent left hip unipolar hemiarthroplasty on 11/18/2020.    Assessment/Plan:   Principal Problem:   Closed left hip fracture, initial encounter [facility]) Active Problems:   Essential hypertension   Hyponatremia   Pauci-immune Glomerulonephritis   On long term prednisone therapy   Stage 3b chronic kidney disease (HCC)   Chronic back pain   Leukocytosis   Closed fracture of multiple pubic rami, left, initial encounter ([place])   Closed intertrochanteric fracture of left femur (HCC)   Nutrition Problem: Increased nutrient needs Etiology: post-op healing  Signs/Symptoms: estimated needs   Body mass index is 28.72 kg/m.    Closed left hip fracture and left superior and inferior pubic rami fracture suspected to be due to pathologic fracture: s/p left hip arthroplasty on 11/18/2020.  Continue analgesics as needed for pain.  Continue PT and OT.  Plan to discharge to SNF.  Awaiting placement to SNF.  Hyponatremia: Improved  CKD stage IIIb, pauci-immune glomerulonephritis from April 2021: Creatinine  is at baseline.  Continue long-term prednisone.  Leukocytosis: Improving.  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  This may be partly due to steroids.  Hypertension: Continue antihypertensives  History of lumbar radiculopathy requiring epidural steroid injection/chronic degenerative disc disease: Analgesics as needed for pain.   Diet Order            Diet regular Room service appropriate? Yes; Fluid consistency: Thin  Diet effective now                    Consultants:  Orthopedic surgeon  Procedures:  Left hip arthroplasty 11/18/2020    Medications:   . amLODipine  5 mg Oral Daily  . atenolol  12.5 mg Oral BID  . cloNIDine  0.2 mg Oral TID  . docusate sodium  100 mg Oral BID  . enoxaparin (LOVENOX) injection  40 mg Subcutaneous Q24H  . feeding supplement (NEPRO CARB STEADY)  237 mL Oral Q24H  . furosemide  20 mg Oral Once per day on Tue Thu Sat  . losartan  100 mg Oral Daily  . predniSONE  10 mg Oral Daily  . sodium chloride  1 g Oral BID WC  . spironolactone  25 mg Oral Daily  . cyanocobalamin  1,000 mcg Oral Daily   Continuous Infusions: . sodium chloride Stopped (11/21/20 2032)     Anti-infectives (From admission, onward)   Start     Dose/Rate Route Frequency Ordered Stop   11/18/20 1630  ceFAZolin (ANCEF) IVPB 2g/100 mL premix        2 g 200 mL/hr over  30 Minutes Intravenous Every 6 hours 11/18/20 1629 11/19/20 0436   11/18/20 0910  ceFAZolin (ANCEF) 2-4 GM/100ML-% IVPB       Note to Pharmacy: Register, Karen   : cabinet override      11/18/20 0910 11/18/20 1706   11/18/20 0312  ceFAZolin (ANCEF) IVPB 2g/100 mL premix        2 g 200 mL/hr over 30 Minutes Intravenous 30 min pre-op 11/18/20 0314 11/18/20 0944             Family Communication/Anticipated D/C date and plan/Code Status   DVT prophylaxis: enoxaparin (LOVENOX) injection 40 mg Start: 11/19/20 0800 SCDs Start: 11/18/20 1114 Place TED hose Start: 11/18/20 1114     Code  Status: Full Code  Family Communication: None Disposition Plan:    Status is: Inpatient  Remains inpatient appropriate because:Unsafe d/c plan   Dispo: The patient is from: Home              Anticipated d/c is to: SNF              Anticipated d/c date is: 1 day              Patient currently is medically stable to d/c.           Subjective:   Interval events noted.  No new complaints.  Pain in left hip is not too bad.  Objective:    Vitals:   11/23/20 0002 11/23/20 0421 11/23/20 0811 11/23/20 1258  BP: (!) 157/78 (!) 145/79 (!) 150/83 116/66  Pulse: 79 80 94 89  Resp: 16 16 16 17   Temp: 98 F (36.7 C) 98.1 F (36.7 C) 98.1 F (36.7 C) 97.9 F (36.6 C)  TempSrc: Oral Oral Oral Oral  SpO2: 97% 96% 99% 96%  Weight:      Height:       No data found.   Intake/Output Summary (Last 24 hours) at 11/23/2020 1411 Last data filed at 11/23/2020 1023 Gross per 24 hour  Intake 480 ml  Output 401 ml  Net 79 ml   Filed Weights   11/17/20 1814  Weight: 66.7 kg    Exam:  GEN: NAD SKIN: Warm and dry EYES: No pallor no icterus ENT: MMM CV: RRR PULM: CTA B ABD: soft, ND, NT, +BS CNS: AAO x 3, non focal EXT: Mild left hip swelling and tenderness.  Dressing on left hip surgical wound is intact     Data Reviewed:   I have personally reviewed following labs and imaging studies:  Labs: Labs show the following:   Basic Metabolic Panel: Recent Labs  Lab 11/19/20 0605 11/19/20 0605 11/20/20 0546 11/20/20 0546 11/21/20 0633 11/21/20 0633 11/22/20 0449 11/23/20 0504  NA 126*  --  135  --  134*  --  135 135  K 4.3   < > 5.3*   < > 4.3   < > 4.7 4.1  CL 95*  --  102  --  100  --  100 100  CO2 21*  --  26  --  26  --  26 27  GLUCOSE 148*  --  101*  --  90  --  92 98  BUN 21  --  24*  --  30*  --  29* 32*  CREATININE 1.56*  --  1.35*  --  1.19*  --  1.13* 1.13*  CALCIUM 8.9  --  8.9  --  8.9  --  8.7* 8.6*   < > =  values in this interval not  displayed.   GFR Estimated Creatinine Clearance: 35 mL/min (A) (by C-G formula based on SCr of 1.13 mg/dL (H)). Liver Function Tests: No results for input(s): AST, ALT, ALKPHOS, BILITOT, PROT, ALBUMIN in the last 168 hours. No results for input(s): LIPASE, AMYLASE in the last 168 hours. No results for input(s): AMMONIA in the last 168 hours. Coagulation profile No results for input(s): INR, PROTIME in the last 168 hours.  CBC: Recent Labs  Lab 11/17/20 2217 11/17/20 2217 11/18/20 0415 11/19/20 0605 11/20/20 0546 11/21/20 0633 11/23/20 0504  WBC 36.0*   < > 24.6* 25.9* 18.1* 16.5* 14.0*  NEUTROABS 31.0*  --   --   --   --  11.1* 7.7  HGB 16.0*   < > 15.4* 13.6 12.1 12.1 12.1  HCT 44.3   < > 41.8 37.8 35.3* 34.7* 34.9*  MCV 85.4   < > 84.4 86.3 91.5 89.4 89.9  PLT 397   < > 328 293 263 278 308   < > = values in this interval not displayed.   Cardiac Enzymes: No results for input(s): CKTOTAL, CKMB, CKMBINDEX, TROPONINI in the last 168 hours. BNP (last 3 results) No results for input(s): PROBNP in the last 8760 hours. CBG: No results for input(s): GLUCAP in the last 168 hours. D-Dimer: No results for input(s): DDIMER in the last 72 hours. Hgb A1c: No results for input(s): HGBA1C in the last 72 hours. Lipid Profile: No results for input(s): CHOL, HDL, LDLCALC, TRIG, CHOLHDL, LDLDIRECT in the last 72 hours. Thyroid function studies: No results for input(s): TSH, T4TOTAL, T3FREE, THYROIDAB in the last 72 hours.  Invalid input(s): FREET3 Anemia work up: No results for input(s): VITAMINB12, FOLATE, FERRITIN, TIBC, IRON, RETICCTPCT in the last 72 hours. Sepsis Labs: Recent Labs  Lab 11/19/20 0605 11/20/20 0546 11/21/20 0633 11/23/20 0504  WBC 25.9* 18.1* 16.5* 14.0*    Microbiology Recent Results (from the past 240 hour(s))  Resp Panel by RT-PCR (Flu A&B, Covid) Nasopharyngeal Swab     Status: None   Collection Time: 11/17/20 10:17 PM   Specimen: Nasopharyngeal  Swab; Nasopharyngeal(NP) swabs in vial transport medium  Result Value Ref Range Status   SARS Coronavirus 2 by RT PCR NEGATIVE NEGATIVE Final    Comment: (NOTE) SARS-CoV-2 target nucleic acids are NOT DETECTED.  The SARS-CoV-2 RNA is generally detectable in upper respiratory specimens during the acute phase of infection. The lowest concentration of SARS-CoV-2 viral copies this assay can detect is 138 copies/mL. A negative result does not preclude SARS-Cov-2 infection and should not be used as the sole basis for treatment or other patient management decisions. A negative result may occur with  improper specimen collection/handling, submission of specimen other than nasopharyngeal swab, presence of viral mutation(s) within the areas targeted by this assay, and inadequate number of viral copies(<138 copies/mL). A negative result must be combined with clinical observations, patient history, and epidemiological information. The expected result is Negative.  Fact Sheet for Patients:  EntrepreneurPulse.com.au  Fact Sheet for Healthcare Providers:  IncredibleEmployment.be  This test is no t yet approved or cleared by the [place] FDA and  has been authorized for detection and/or diagnosis of SARS-CoV-2 by FDA under an Emergency Use Authorization (EUA). This EUA will remain  in effect (meaning this test can be used) for the duration of the COVID-19 declaration under Section 564(b)(1) of the Act, 21 U.S.C.section 360bbb-3(b)(1), unless the authorization is terminated  or revoked sooner.  Influenza A by PCR NEGATIVE NEGATIVE Final   Influenza B by PCR NEGATIVE NEGATIVE Final    Comment: (NOTE) The Xpert Xpress SARS-CoV-2/FLU/RSV plus assay is intended as an aid in the diagnosis of influenza from Nasopharyngeal swab specimens and should not be used as a sole basis for treatment. Nasal washings and aspirates are unacceptable for Xpert Xpress  SARS-CoV-2/FLU/RSV testing.  Fact Sheet for Patients: EntrepreneurPulse.com.au  Fact Sheet for Healthcare Providers: IncredibleEmployment.be  This test is not yet approved or cleared by the [place] FDA and has been authorized for detection and/or diagnosis of SARS-CoV-2 by FDA under an Emergency Use Authorization (EUA). This EUA will remain in effect (meaning this test can be used) for the duration of the COVID-19 declaration under Section 564(b)(1) of the Act, 21 U.S.C. section 360bbb-3(b)(1), unless the authorization is terminated or revoked.  Performed at [facility], [address] [place], [place] [address]   Resp Panel by RT-PCR (Flu A&B, Covid) Nasopharyngeal Swab     Status: None   Collection Time: 11/22/20  9:38 AM   Specimen: Nasopharyngeal Swab; Nasopharyngeal(NP) swabs in vial transport medium  Result Value Ref Range Status   SARS Coronavirus 2 by RT PCR NEGATIVE NEGATIVE Final    Comment: (NOTE) SARS-CoV-2 target nucleic acids are NOT DETECTED.  The SARS-CoV-2 RNA is generally detectable in upper respiratory specimens during the acute phase of infection. The lowest concentration of SARS-CoV-2 viral copies this assay can detect is 138 copies/mL. A negative result does not preclude SARS-Cov-2 infection and should not be used as the sole basis for treatment or other patient management decisions. A negative result may occur with  improper specimen collection/handling, submission of specimen other than nasopharyngeal swab, presence of viral mutation(s) within the areas targeted by this assay, and inadequate number of viral copies(<138 copies/mL). A negative result must be combined with clinical observations, patient history, and epidemiological information. The expected result is Negative.  Fact Sheet for Patients:  EntrepreneurPulse.com.au  Fact Sheet for Healthcare Providers:    IncredibleEmployment.be  This test is no t yet approved or cleared by the [place] FDA and  has been authorized for detection and/or diagnosis of SARS-CoV-2 by FDA under an Emergency Use Authorization (EUA). This EUA will remain  in effect (meaning this test can be used) for the duration of the COVID-19 declaration under Section 564(b)(1) of the Act, 21 U.S.C.section 360bbb-3(b)(1), unless the authorization is terminated  or revoked sooner.       Influenza A by PCR NEGATIVE NEGATIVE Final   Influenza B by PCR NEGATIVE NEGATIVE Final    Comment: (NOTE) The Xpert Xpress SARS-CoV-2/FLU/RSV plus assay is intended as an aid in the diagnosis of influenza from Nasopharyngeal swab specimens and should not be used as a sole basis for treatment. Nasal washings and aspirates are unacceptable for Xpert Xpress SARS-CoV-2/FLU/RSV testing.  Fact Sheet for Patients: EntrepreneurPulse.com.au  Fact Sheet for Healthcare Providers: IncredibleEmployment.be  This test is not yet approved or cleared by the [place] FDA and has been authorized for detection and/or diagnosis of SARS-CoV-2 by FDA under an Emergency Use Authorization (EUA). This EUA will remain in effect (meaning this test can be used) for the duration of the COVID-19 declaration under Section 564(b)(1) of the Act, 21 U.S.C. section 360bbb-3(b)(1), unless the authorization is terminated or revoked.  Performed at [facility], [address] [place], [place] [address]     Procedures and diagnostic studies:  No results found.  LOS: 5 days   Mia Winthrop  [facility]   Pager on [email or web address]. If 7PM-7AM, please contact night-coverage at [email or web address]     11/23/2020, 2:11 PM

## 2020-11-23 NOTE — Progress Notes (Signed)
PT Cancellation Note  Patient Details Name: Linda Bradley MRN: [id number] DOB: August 25, 1941   Cancelled Treatment:     PT attempt. Pt just finished working with OT and requested therapist return after lunch. PT will continue to follow per POC progressing as able.    Willette Pa 11/23/2020, 1:18 PM

## 2020-11-24 DIAGNOSIS — S72002A Fracture of unspecified part of neck of left femur, initial encounter for closed fracture: Secondary | ICD-10-CM | POA: Diagnosis not present

## 2020-11-24 LAB — CBC WITH DIFFERENTIAL/PLATELET
Abs Immature Granulocytes: 0.39 10*3/uL — ABNORMAL HIGH (ref 0.00–0.07)
Basophils Absolute: 0.1 10*3/uL (ref 0.0–0.1)
Basophils Relative: 0 %
Eosinophils Absolute: 0.6 10*3/uL — ABNORMAL HIGH (ref 0.0–0.5)
Eosinophils Relative: 4 %
HCT: 35.9 % — ABNORMAL LOW (ref 36.0–46.0)
Hemoglobin: 12.1 g/dL (ref 12.0–15.0)
Immature Granulocytes: 3 %
Lymphocytes Relative: 27 %
Lymphs Abs: 4.3 10*3/uL — ABNORMAL HIGH (ref 0.7–4.0)
MCH: 30.8 pg (ref 26.0–34.0)
MCHC: 33.7 g/dL (ref 30.0–36.0)
MCV: 91.3 fL (ref 80.0–100.0)
Monocytes Absolute: 1.5 10*3/uL — ABNORMAL HIGH (ref 0.1–1.0)
Monocytes Relative: 10 %
Neutro Abs: 9 10*3/uL — ABNORMAL HIGH (ref 1.7–7.7)
Neutrophils Relative %: 56 %
Platelets: 328 10*3/uL (ref 150–400)
RBC: 3.93 MIL/uL (ref 3.87–5.11)
RDW: 13.6 % (ref 11.5–15.5)
WBC: 15.8 10*3/uL — ABNORMAL HIGH (ref 4.0–10.5)
nRBC: 0 % (ref 0.0–0.2)

## 2020-11-24 MED ORDER — ATENOLOL 25 MG PO TABS
12.5000 mg | ORAL_TABLET | Freq: Every day | ORAL | Status: AC
Start: 2020-11-24 — End: ?

## 2020-11-24 MED ORDER — CLONAZEPAM 0.5 MG PO TABS
0.5000 mg | ORAL_TABLET | Freq: Two times a day (BID) | ORAL | 0 refills | Status: DC | PRN
Start: 2020-11-24 — End: 2023-09-29

## 2020-11-24 MED ORDER — PANTOPRAZOLE SODIUM 40 MG PO TBEC: 40.0000 mg | DELAYED_RELEASE_TABLET | ORAL | Status: DC

## 2020-11-24 NOTE — Progress Notes (Signed)
Subjective: 6 Days Post-Op Procedure(s) (LRB): ARTHROPLASTY BIPOLAR HIP (HEMIARTHROPLASTY) (Left) Patient reports pain as mild.   Patient is well with no complaints. Negative for chest pain and shortness of breath Fever: no Gastrointestinal:Negative for nausea and vomiting. Patient has had a BM.  Objective: Vital signs in last 24 hours: Temp:  [97.5 F (36.4 C)-98.5 F (36.9 C)] 97.9 F (36.6 C) (12/03 0734) Pulse Rate:  [70-92] 88 (12/03 0734) Resp:  [15-18] 17 (12/03 0734) BP: (102-151)/(66-79) 145/78 (12/03 0734) SpO2:  [96 %-99 %] 99 % (12/03 0734)  Intake/Output from previous day:  Intake/Output Summary (Last 24 hours) at 11/24/2020 0842 Last data filed at 11/24/2020 0503 Gross per 24 hour  Intake 720 ml  Output 400 ml  Net 320 ml    Intake/Output this shift: No intake/output data recorded.  Labs: Recent Labs    11/23/20 0504 11/24/20 0549  HGB 12.1 12.1   Recent Labs    11/23/20 0504 11/24/20 0549  WBC 14.0* 15.8*  RBC 3.88 3.93  HCT 34.9* 35.9*  PLT 308 328   Recent Labs    11/22/20 0449 11/23/20 0504  NA 135 135  K 4.7 4.1  CL 100 100  CO2 26 27  BUN 29* 32*  CREATININE 1.13* 1.13*  GLUCOSE 92 98  CALCIUM 8.7* 8.6*   No results for input(s): LABPT, INR in the last 72 hours.   EXAM General - Patient is Alert, Appropriate and Oriented Extremity - ABD soft Sensation intact distally Intact pulses distally Dorsiflexion/Plantar flexion intact Incision: moderate drainage No cellulitis present Dressing/Incision -Moderate serosanguinous drainage.  Honeycomb dressing changed this AM. Motor Function - intact, moving foot and toes well on exam.   Past Medical History:  Diagnosis Date  . Anxiety disorder   . Dermatitis   . 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 Heart murmur   . Hypercholesterolemia   . Hypertension   . Lymphadenitis   . Renal disorder   . Skin cancer   . Spontaneous ecchymoses   . Vitamin D deficiency      Assessment/Plan: 6 Days Post-Op Procedure(s) (LRB): ARTHROPLASTY BIPOLAR HIP (HEMIARTHROPLASTY) (Left) Principal Problem:   Closed left hip fracture, initial encounter [facility]) Active Problems:   Essential hypertension   Hyponatremia   Pauci-immune Glomerulonephritis   On long term prednisone therapy   Stage 3b chronic kidney disease (HCC)   Chronic back pain   Leukocytosis   Closed fracture of multiple pubic rami, left, initial encounter ([place])   Closed intertrochanteric fracture of left femur (HCC)  Estimated body mass index is 28.72 kg/m as calculated from the following:   Height as of this encounter: 5' (1.524 m).   Weight as of this encounter: 66.7 kg. Up with therapy   Vitals stable this morning. Patient has had a BM. Plan for discharge to SNF when able.  Staples can be removed by SNF on 01/03/20.  Follow-up with [facility] orthopaedics in 6 weeks. Lovenox 40 mg subcu daily x14 days at discharge TED hose bilateral lower extremity x6 weeks  DVT Prophylaxis - Lovenox, Foot Pumps and TED hose Weight-Bearing as tolerated to Left leg  J. Cameron Proud, PA-C [facility]opa[facility] 11/24/2020, 8:42 AM

## 2020-11-24 NOTE — Discharge Instructions (Signed)
Instructions after Hip Replacement     J. Dorien Chihuahua, M.D.  Raquel Sanam Marmo, PA-C     Dept. of [facility]  [address], [place]  [address]  Phone: [phone]   Fax: [phone]    DIET:  Drink plenty of non-alcoholic fluids.  Resume your normal diet. Include foods high in fiber.  ACTIVITY:   You may use crutches or a walker with weight-bearing as tolerated, unless instructed otherwise.  You may be weaned off of the walker or crutches by your Physical Therapist.   Do NOT reach below the level of your knees or cross your legs until allowed.     Continue doing gentle exercises. Exercising will reduce the pain and swelling, increase motion, and prevent muscle weakness.    Please continue to use the TED compression stockings for 6 weeks. You may remove the stockings at night, but should reapply them in the morning.  Do not drive or operate any equipment until instructed.  WOUND CARE:   Continue to use ice packs periodically to reduce pain and swelling.  Keep the incision clean and dry.  You may bathe or shower after the staples are removed at the first office visit following surgery.  MEDICATIONS:  You may resume your regular medications.  Please take the pain medication as prescribed on the medication.  Do not take pain medication on an empty stomach.  You have been given a prescription for a blood thinner to prevent blood clots. Please take the medication as instructed. (NOTE: After completing a 2 week course of Lovenox, take one Enteric-coated aspirin once a day.)  Pain medications and iron supplements can cause constipation. Use a stool softener (Senokot or Colace) on a daily basis and a laxative (dulcolax or miralax) as needed.  Do not drive or drink alcoholic beverages when taking pain medications.  CALL THE OFFICE FOR:  Temperature above 101 degrees  Excessive bleeding or drainage on the  dressing.  Excessive swelling, coldness, or paleness of the toes.  Persistent nausea and vomiting.  FOLLOW-UP:   You should have an appointment to return to the office in 6 weeks after surgery for x-rays.  Staples will be removed by SNF facility.  Arrangements have been made for continuation of Physical Therapy (either home therapy or outpatient therapy).

## 2020-11-24 NOTE — Care Management Important Message (Signed)
Important Message  Patient Details  Name: MARIELY MAHR MRN: [id number] Date of Birth: [birth year]   Medicare Important Message Given:  Yes     Juliann Pulse A Kanitra Purifoy 11/24/2020, 11:06 AM

## 2020-11-24 NOTE — Discharge Summary (Signed)
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  Diet recommendation:  Diet Order            Diet - low sodium heart healthy           Diet regular Room service appropriate? Yes; Fluid consistency: Thin  Diet effective now                   Code Status: Full Code     Hospital Course:   [name] is a 79 y.o. 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  There was no history of fall or trauma.  She was found to have left superior and inferior pubic rami fractures and closed left hip fracture suspected to be due to pathologic fracture.  She was seen in consultation by orthopedic surgeon and she underwent left hip unipolar hemiarthroplasty on 11/18/2020.   She was treated with analgesics.  She was evaluated by PT and OT recommended further rehabilitation at a skilled nursing facility.  Her condition has improved and she is deemed stable for discharge to SNF today.    Medical Consultants:    Orthopedic surgeon, Dr. Roland Rack   Discharge Exam:    Vitals:   11/23/20 2015 11/24/20 0003 11/24/20 0324 11/24/20 0734  BP: 122/76 102/72 (!) 142/68 (!) 145/78  Pulse: 86 70 73 88  Resp: 18 15 16 17   Temp: 98.1 F (36.7 C) (!) 97.5 F (36.4 C) (!) 97.5 F (36.4 C) 97.9 F (36.6 C)  TempSrc:    Oral  SpO2: 97% 96% 98% 99%  Weight:      Height:         GEN: NAD SKIN: Multiple bruises on bilateral upper extremities EYES: No pallor or icterus ENT: MMM CV: RRR PULM: CTA B ABD: soft, ND, NT, +BS CNS: AAO x 3, non focal EXT: Mild left hip swelling and tenderness.  Dressing on left hip surgical wound is clean and dry.   The results of significant diagnostics from this hospitalization (including imaging, microbiology, ancillary and laboratory) are listed below for reference.     Procedures and Diagnostic Studies:   CT Lumbar Spine Wo Contrast  Result Date: 11/18/2020 CLINICAL DATA:  Initial evaluation for left lower back pain radiating into the left lower extremity. EXAM: CT LUMBAR SPINE WITHOUT CONTRAST TECHNIQUE: Multidetector CT imaging of the  lumbar spine was performed without intravenous contrast administration. Multiplanar CT image reconstructions were also generated. COMPARISON:  Prior MRI from 08/02/2020. FINDINGS: Segmentation: Standard. Lowest well-formed disc space labeled the L5-S1 level. Alignment: Severe levoscoliosis.  With apex at L1-2. No listhesis. Vertebrae: Vertebral body height maintained without acute or interval fracture. Subacute bilateral sacral insufficiency fractures again seen, with fracture line extending through the S2 segment, relatively similar to previous MRI. No other new or interval fracture. No discrete lytic  or blastic osseous lesions. Paraspinal and other soft tissues: Paraspinous soft tissues demonstrate no acute finding. Moderate aorto bi-iliac atherosclerotic disease. Moderate distension of the partially visualized stomach. Disc levels: T11-12: Disc desiccation without significant disc bulge. Right-sided reactive endplate change. Moderate right with mild left facet hypertrophy. No spinal stenosis. Mild to moderate bilateral foraminal narrowing. T12-L1: Right-sided reactive endplate spurring without significant disc bulge. Mild right-sided facet hypertrophy. No significant spinal stenosis. Foramina appear patent. L1-2: Mild diffuse disc bulge, eccentric to the left. Superimposed shallow central disc protrusion mildly indents the ventral thecal sac. Moderate right with mild left facet hypertrophy. No significant spinal stenosis. Foramina remain patent. Appearance is stable. L2-3: Diffuse disc bulge with disc desiccation. Right-sided reactive endplate spurring. Moderate right worse than left facet arthrosis. Mild spinal stenosis with mild to moderate right lateral recess narrowing. Moderate right foraminal narrowing. Left neural foramina remains patent. L3-4: Mild diffuse disc bulge with reactive endplate spurring, greater on the left. Moderate facet hypertrophy. Resultant moderate left lateral recess stenosis. Moderate left with mild right L3 foraminal narrowing. L4-5: Mild disc bulge. Severe left with moderate right facet hypertrophy. Mild narrowing of the left lateral recess. Mild to moderate left foraminal narrowing. Right neural foramen remains patent. L5-S1: Minimal annular disc bulge. Severe left with moderate right facet hypertrophy. No spinal stenosis. Foramina remain patent. IMPRESSION: 1. Subacute bilateral sacral insufficiency fractures, relatively similar in appearance to previous MRI. 2. No other new acute abnormality within the lumbar spine. 3. Severe levoscoliosis with moderate to advanced multilevel  degenerative spondylosis and facet arthrosis as detailed above. Appearance is relatively stable from previous. Please see above report for a full description of these findings. 4. Aortic Atherosclerosis (ICD10-I70.0). Electronically Signed   By: Jeannine Boga M.D.   On: 11/18/2020 00:12   DG Chest Port 1 View  Result Date: 11/18/2020 CLINICAL DATA:  Progressive worsening of back pain. EXAM: PORTABLE CHEST 1 VIEW COMPARISON:  04/11/2020 FINDINGS: Heart size and pulmonary vascularity are normal. Lungs are clear. Thoracic scoliosis convex towards the right with degenerative changes. No pleural effusions. No pneumothorax. Calcification of the aorta. IMPRESSION: No active disease. Electronically Signed   By: Lucienne Capers M.D.   On: 11/18/2020 02:20   DG HIP UNILAT W OR W/O PELVIS 2-3 VIEWS LEFT  Result Date: 11/18/2020 CLINICAL DATA:  Status post total hip replacement EXAM: DG HIP (WITH OR WITHOUT PELVIS) 2-3V LEFT COMPARISON:  November 18, 2020 study obtained earlier in the day FINDINGS: Frontal mid to lower pelvis and hips as well as lateral left hip images were obtained. There is a total hip replacement on the left with prosthetic components well-seated. No fracture or dislocation in the area of surgery. Fractures of the left superior pubic ramus and ischium again noted without change. There is mild narrowing of the right hip joint. IMPRESSION: Total hip replacement on the left with prosthetic components well-seated. No fracture or dislocation in the area of hip replacement. Fractures of the left ischium and left superior pubic ramus are unchanged compared  to earlier in the day. Mild narrowing right hip joint. No new fracture appreciable. Electronically Signed   By: Lowella Grip III M.D.   On: 11/18/2020 12:42   DG HIP UNILAT WITH PELVIS 2-3 VIEWS LEFT  Result Date: 11/18/2020 CLINICAL DATA:  Worsening of back pain today. EXAM: DG HIP (WITH OR WITHOUT PELVIS) 2-3V LEFT COMPARISON:  None.  FINDINGS: Transverse subcapital fracture of the left femoral neck with varus angulation. Fractures are also present of the left superior and inferior pubic rami, possibly with healing. Degenerative changes in the lower lumbar spine and both hips. IMPRESSION: Transverse subcapital fracture of the left femoral neck with varus angulation. Fractures of the left superior and inferior pubic rami possibly demonstrating healing. Electronically Signed   By: Lucienne Capers M.D.   On: 11/18/2020 02:22     Labs:   Basic Metabolic Panel: Recent Labs  Lab 11/19/20 0605 11/19/20 0605 11/20/20 0546 11/20/20 0546 11/21/20 0633 11/21/20 0633 11/22/20 0449 11/23/20 0504  NA 126*  --  135  --  134*  --  135 135  K 4.3   < > 5.3*   < > 4.3   < > 4.7 4.1  CL 95*  --  102  --  100  --  100 100  CO2 21*  --  26  --  26  --  26 27  GLUCOSE 148*  --  101*  --  90  --  92 98  BUN 21  --  24*  --  30*  --  29* 32*  CREATININE 1.56*  --  1.35*  --  1.19*  --  1.13* 1.13*  CALCIUM 8.9  --  8.9  --  8.9  --  8.7* 8.6*   < > = values in this interval not displayed.   GFR Estimated Creatinine Clearance: 35 mL/min (A) (by C-G formula based on SCr of 1.13 mg/dL (H)). Liver Function Tests: No results for input(s): AST, ALT, ALKPHOS, BILITOT, PROT, ALBUMIN in the last 168 hours. No results for input(s): LIPASE, AMYLASE in the last 168 hours. No results for input(s): AMMONIA in the last 168 hours. Coagulation profile No results for input(s): INR, PROTIME in the last 168 hours.  CBC: Recent Labs  Lab 11/17/20 2217 11/18/20 0415 11/19/20 0605 11/20/20 0546 11/21/20 0633 11/23/20 0504 11/24/20 0549  WBC 36.0*   < > 25.9* 18.1* 16.5* 14.0* 15.8*  NEUTROABS 31.0*  --   --   --  11.1* 7.7 9.0*  HGB 16.0*   < > 13.6 12.1 12.1 12.1 12.1  HCT 44.3   < > 37.8 35.3* 34.7* 34.9* 35.9*  MCV 85.4   < > 86.3 91.5 89.4 89.9 91.3  PLT 397   < > 293 263 278 308 328   < > = values in this interval not displayed.    Cardiac Enzymes: No results for input(s): CKTOTAL, CKMB, CKMBINDEX, TROPONINI in the last 168 hours. BNP: Invalid input(s): POCBNP CBG: No results for input(s): GLUCAP in the last 168 hours. D-Dimer No results for input(s): DDIMER in the last 72 hours. Hgb A1c No results for input(s): HGBA1C in the last 72 hours. Lipid Profile No results for input(s): CHOL, HDL, LDLCALC, TRIG, CHOLHDL, LDLDIRECT in the last 72 hours. Thyroid function studies No results for input(s): TSH, T4TOTAL, T3FREE, THYROIDAB in the last 72 hours.  Invalid input(s): FREET3 Anemia work up No results for input(s): VITAMINB12, FOLATE, FERRITIN, TIBC, IRON, RETICCTPCT in the last 72 hours. Microbiology  Recent Results (from the past 240 hour(s))  Resp Panel by RT-PCR (Flu A&B, Covid) Nasopharyngeal Swab     Status: None   Collection Time: 11/17/20 10:17 PM   Specimen: Nasopharyngeal Swab; Nasopharyngeal(NP) swabs in vial transport medium  Result Value Ref Range Status   SARS Coronavirus 2 by RT PCR NEGATIVE NEGATIVE Final    Comment: (NOTE) SARS-CoV-2 target nucleic acids are NOT DETECTED.  The SARS-CoV-2 RNA is generally detectable in upper respiratory specimens during the acute phase of infection. The lowest concentration of SARS-CoV-2 viral copies this assay can detect is 138 copies/mL. A negative result does not preclude SARS-Cov-2 infection and should not be used as the sole basis for treatment or other patient management decisions. A negative result may occur with  improper specimen collection/handling, submission of specimen other than nasopharyngeal swab, presence of viral mutation(s) within the areas targeted by this assay, and inadequate number of viral copies(<138 copies/mL). A negative result must be combined with clinical observations, patient history, and epidemiological information. The expected result is Negative.  Fact Sheet for Patients:   EntrepreneurPulse.com.au  Fact Sheet for Healthcare Providers:  IncredibleEmployment.be  This test is no t yet approved or cleared by the [place] FDA and  has been authorized for detection and/or diagnosis of SARS-CoV-2 by FDA under an Emergency Use Authorization (EUA). This EUA will remain  in effect (meaning this test can be used) for the duration of the COVID-19 declaration under Section 564(b)(1) of the Act, 21 U.S.C.section 360bbb-3(b)(1), unless the authorization is terminated  or revoked sooner.       Influenza A by PCR NEGATIVE NEGATIVE Final   Influenza B by PCR NEGATIVE NEGATIVE Final    Comment: (NOTE) The Xpert Xpress SARS-CoV-2/FLU/RSV plus assay is intended as an aid in the diagnosis of influenza from Nasopharyngeal swab specimens and should not be used as a sole basis for treatment. Nasal washings and aspirates are unacceptable for Xpert Xpress SARS-CoV-2/FLU/RSV testing.  Fact Sheet for Patients: EntrepreneurPulse.com.au  Fact Sheet for Healthcare Providers: IncredibleEmployment.be  This test is not yet approved or cleared by the [place] FDA and has been authorized for detection and/or diagnosis of SARS-CoV-2 by FDA under an Emergency Use Authorization (EUA). This EUA will remain in effect (meaning this test can be used) for the duration of the COVID-19 declaration under Section 564(b)(1) of the Act, 21 U.S.C. section 360bbb-3(b)(1), unless the authorization is terminated or revoked.  Performed at [facility], [address] [place], [place] [address]   Resp Panel by RT-PCR (Flu A&B, Covid) Nasopharyngeal Swab     Status: None   Collection Time: 11/22/20  9:38 AM   Specimen: Nasopharyngeal Swab; Nasopharyngeal(NP) swabs in vial transport medium  Result Value Ref Range Status   SARS Coronavirus 2 by RT PCR NEGATIVE NEGATIVE Final    Comment: (NOTE) SARS-CoV-2  target nucleic acids are NOT DETECTED.  The SARS-CoV-2 RNA is generally detectable in upper respiratory specimens during the acute phase of infection. The lowest concentration of SARS-CoV-2 viral copies this assay can detect is 138 copies/mL. A negative result does not preclude SARS-Cov-2 infection and should not be used as the sole basis for treatment or other patient management decisions. A negative result may occur with  improper specimen collection/handling, submission of specimen other than nasopharyngeal swab, presence of viral mutation(s) within the areas targeted by this assay, and inadequate number of viral copies(<138 copies/mL). A negative result must be combined with clinical observations, patient history, and epidemiological information.  The expected result is Negative.  Fact Sheet for Patients:  EntrepreneurPulse.com.au  Fact Sheet for Healthcare Providers:  IncredibleEmployment.be  This test is no t yet approved or cleared by the [place] FDA and  has been authorized for detection and/or diagnosis of SARS-CoV-2 by FDA under an Emergency Use Authorization (EUA). This EUA will remain  in effect (meaning this test can be used) for the duration of the COVID-19 declaration under Section 564(b)(1) of the Act, 21 U.S.C.section 360bbb-3(b)(1), unless the authorization is terminated  or revoked sooner.       Influenza A by PCR NEGATIVE NEGATIVE Final   Influenza B by PCR NEGATIVE NEGATIVE Final    Comment: (NOTE) The Xpert Xpress SARS-CoV-2/FLU/RSV plus assay is intended as an aid in the diagnosis of influenza from Nasopharyngeal swab specimens and should not be used as a sole basis for treatment. Nasal washings and aspirates are unacceptable for Xpert Xpress SARS-CoV-2/FLU/RSV testing.  Fact Sheet for Patients: EntrepreneurPulse.com.au  Fact Sheet for Healthcare  Providers: IncredibleEmployment.be  This test is not yet approved or cleared by the [place] FDA and has been authorized for detection and/or diagnosis of SARS-CoV-2 by FDA under an Emergency Use Authorization (EUA). This EUA will remain in effect (meaning this test can be used) for the duration of the COVID-19 declaration under Section 564(b)(1) of the Act, 21 U.S.C. section 360bbb-3(b)(1), unless the authorization is terminated or revoked.  Performed at [facility], [address] [place], [place] [address]      Discharge Instructions:   Discharge Instructions    Diet - low sodium heart healthy   Complete by: As directed    Discharge wound care:   Complete by: As directed    Keep wound clean and dry   Increase activity slowly   Complete by: As directed    Place TED hose   Complete by: As directed    Apply TED hose to bilateral lower extremities for 6 weeks     Allergies as of 11/24/2020      Reactions   Amlodipine Swelling   Coreg [carvedilol] Nausea Only, Other (See Comments)   Syncope   Statins Other (See Comments)   Other Reaction: CNS DISORDER   Chlorthalidone Diarrhea      Medication List    STOP taking these medications   cloNIDine 0.2 MG tablet Commonly known as: CATAPRES   losartan 100 MG tablet Commonly known as: COZAAR   oxyCODONE 5 MG immediate release tablet Commonly known as: Oxy IR/ROXICODONE     TAKE these medications   amLODipine 5 MG tablet Commonly known as: NORVASC Take 5 mg by mouth daily.   atenolol 25 MG tablet Commonly known as: TENORMIN Take 0.5 tablets (12.5 mg total) by mouth daily.   clonazePAM 0.5 MG tablet Commonly known as: KLONOPIN Take 1 tablet (0.5 mg total) by mouth 2 (two) times daily as needed for anxiety. What changed: when to take this   cyanocobalamin 1000 MCG tablet Take 1 tablet (1,000 mcg total) by mouth daily.   enoxaparin 40 MG/0.4ML injection Commonly known as:  LOVENOX Inject 0.4 mLs (40 mg total) into the skin daily.   feeding supplement (NEPRO CARB STEADY) Liqd Take 237 mLs by mouth daily.   furosemide 20 MG tablet Commonly known as: LASIX Take 20 mg by mouth every other day. Take 3 times a week on Tuesday, Thursday and Saturday   HYDROcodone-acetaminophen 5-325 MG tablet Commonly known as: NORCO/VICODIN Take 1-2 tablets by mouth every 6 (  six) hours as needed for moderate pain.   ondansetron 4 MG tablet Commonly known as: ZOFRAN Take 1 tablet (4 mg total) by mouth every 6 (six) hours as needed for nausea.   pantoprazole 40 MG tablet Commonly known as: Protonix Take 1 tablet (40 mg total) by mouth every other day.   predniSONE 10 MG tablet Commonly known as: DELTASONE Take 1 tablet (10 mg total) by mouth daily.   Slow Fe 142 (45 Fe) MG Tbcr Generic drug: Ferrous Sulfate Take 45 mg of iron by mouth daily. Notes to patient: Not given in hospital   spironolactone 25 MG tablet Commonly known as: ALDACTONE Take 25 mg by mouth daily.   traMADol 50 MG tablet Commonly known as: ULTRAM Take 1 tablet (50 mg total) by mouth every 6 (six) hours as needed for moderate pain.            Discharge Care Instructions  (From admission, onward)         Start     Ordered   11/24/20 0000  Discharge wound care:       Comments: Keep wound clean and dry   11/24/20 1044          Contact information for follow-up providers    Lattie Corns, PA-C Follow up in 6 week(s).   Specialty: Physician Assistant Why: Jodell Cipro can be removed by SNF on 01/03/20 Contact information: [address] [place] [address] [phone]            Contact information for after-discharge care    Destination    [facility] Preferred SNF .   Service: Skilled Nursing Contact information: [address] [place] [phone]                   Time coordinating  discharge: 34 minutes  Signed:  Jennye Boroughs  [facility] 11/24/2020, 10:50 AM   Pager on [email or web address]. If 7PM-7AM, please contact night-coverage at [email or web address]

## 2020-11-24 NOTE — TOC Transition Note (Addendum)
Transition of Care [facility]) - CM/SW Discharge Note   Patient Details  Name: Linda Bradley MRN: [id number] Date of Birth: [birth year]  [facility] CM/SW Contact:  Magnus Ivan, LCSW Phone Number: 11/24/2020, 11:03 AM   Clinical Narrative:   Patient to discharge to [facility] today, Room 706. Confirmed with [facility] at [facility]. Gerald Stabs reported COVID test from 12/1 is fine, no new rapid test needed. CSW updated MD, RN, and patient at bedside. Asked RN to call report and MD to submit DC Summary. Medical Necessity Form and Face Sheet placed in Discharge Packet by patient chart. EMS transport arranged for 2:00 pick up through [facility]. No other needs identified prior to discharge.   Final next level of care: Skilled Nursing Facility Barriers to Discharge: Barriers Resolved   Patient Goals and CMS Choice Patient states their goals for this hospitalization and ongoing recovery are:: SNF rehab CMS Medicare.gov Compare Post Acute Care list provided to:: Patient Choice offered to / list presented to : Patient  Discharge Placement              Patient chooses bed at: [facility] Patient to be transferred to facility by: [facility] Name of family member notified: Patient notified, stated she will tell her son. Patient and family notified of of transfer: 11/24/20  Discharge Plan and Services                                     Social Determinants of Health (SDOH) Interventions     Readmission Risk Interventions No flowsheet data found.

## 2020-11-24 NOTE — Progress Notes (Signed)
°  Patient is stable and ready for discharge going to [facility]. Patient's IV removed. Writer called report and spoke with Maudie Mercury, RN receiving patient and verbalized understanding of report given. Patient's hard script prescriptions placed in discharge packet. All discharge paperwork given to [facility]. Patient's belongings packed by NT. Patient's family aware she is discharging to [facility].

## 2020-11-29 ENCOUNTER — Other Ambulatory Visit: Payer: Self-pay | Admitting: Oncology

## 2020-12-04 ENCOUNTER — Ambulatory Visit
Admission: RE | Admit: 2020-12-04 | Discharge: 2020-12-04 | Disposition: A | Discharge status: Home / Self Care | Payer: Medicare HMO | Source: Ambulatory Visit | Attending: Student | Admitting: Student

## 2020-12-04 ENCOUNTER — Other Ambulatory Visit: Payer: Self-pay | Admitting: Student

## 2020-12-04 ENCOUNTER — Other Ambulatory Visit: Payer: Self-pay

## 2020-12-04 DIAGNOSIS — M7989 Other specified soft tissue disorders: Secondary | ICD-10-CM

## 2021-03-02 ENCOUNTER — Ambulatory Visit
Admission: EM | Admit: 2021-03-02 | Discharge: 2021-03-02 | Disposition: A | Discharge status: Home / Self Care | Payer: Medicare HMO | Attending: Family Medicine | Admitting: Family Medicine

## 2021-03-02 ENCOUNTER — Other Ambulatory Visit: Payer: Self-pay

## 2021-03-02 ENCOUNTER — Encounter: Payer: Self-pay | Admitting: Emergency Medicine

## 2021-03-02 DIAGNOSIS — D72829 Elevated white blood cell count, unspecified: Secondary | ICD-10-CM | POA: Insufficient documentation

## 2021-03-02 DIAGNOSIS — N179 Acute kidney failure, unspecified: Secondary | ICD-10-CM | POA: Diagnosis not present

## 2021-03-02 DIAGNOSIS — K625 Hemorrhage of anus and rectum: Secondary | ICD-10-CM | POA: Diagnosis present

## 2021-03-02 LAB — CBC WITH DIFFERENTIAL/PLATELET
Abs Immature Granulocytes: 0.27 10*3/uL — ABNORMAL HIGH (ref 0.00–0.07)
Basophils Absolute: 0.1 10*3/uL (ref 0.0–0.1)
Basophils Relative: 0 %
Eosinophils Absolute: 0.1 10*3/uL (ref 0.0–0.5)
Eosinophils Relative: 0 %
HCT: 42.9 % (ref 36.0–46.0)
Hemoglobin: 14.9 g/dL (ref 12.0–15.0)
Immature Granulocytes: 1 %
Lymphocytes Relative: 5 %
Lymphs Abs: 1.2 10*3/uL (ref 0.7–4.0)
MCH: 31.1 pg (ref 26.0–34.0)
MCHC: 34.7 g/dL (ref 30.0–36.0)
MCV: 89.6 fL (ref 80.0–100.0)
Monocytes Absolute: 1.3 10*3/uL — ABNORMAL HIGH (ref 0.1–1.0)
Monocytes Relative: 6 %
Neutro Abs: 19.5 10*3/uL — ABNORMAL HIGH (ref 1.7–7.7)
Neutrophils Relative %: 88 %
Platelets: 330 10*3/uL (ref 150–400)
RBC: 4.79 MIL/uL (ref 3.87–5.11)
RDW: 12.9 % (ref 11.5–15.5)
WBC: 22.4 10*3/uL — ABNORMAL HIGH (ref 4.0–10.5)
nRBC: 0 % (ref 0.0–0.2)

## 2021-03-02 LAB — BASIC METABOLIC PANEL
Anion gap: 13 (ref 5–15)
BUN: 27 mg/dL — ABNORMAL HIGH (ref 8–23)
CO2: 25 mmol/L (ref 22–32)
Calcium: 9.6 mg/dL (ref 8.9–10.3)
Chloride: 92 mmol/L — ABNORMAL LOW (ref 98–111)
Creatinine, Ser: 1.45 mg/dL — ABNORMAL HIGH (ref 0.44–1.00)
GFR, Estimated: 37 mL/min — ABNORMAL LOW (ref >60)
Glucose, Bld: 158 mg/dL — ABNORMAL HIGH (ref 70–99)
Potassium: 5.1 mmol/L (ref 3.5–5.1)
Sodium: 130 mmol/L — ABNORMAL LOW (ref 135–145)

## 2021-03-02 NOTE — ED Triage Notes (Signed)
Pt states that she has been having rectal bleeding for over a week. Pt states that yesterday she noticed that the bleeding was getting worse. Pt states that the bleeding is bright red. Pt states that she is experiencing some light headedness and dizziness  with the rectal bleeding Pt states that she is taking xarelto 20mg 

## 2021-03-02 NOTE — ED Provider Notes (Signed)
[facility]    CSN: [id number] Arrival date & time: 03/02/21  0950      History   Chief Complaint Chief Complaint  Patient presents with  . Rectal Bleeding   HPI  80 year old female presents with rectal bleeding. Patient reports that she has had intermittent rectal bleeding with bowel movements over the past week.  She states that she initially had some with wiping and a small amount on the toilet.  She states that it has been quite heavy as of yesterday.  Blood is bright red.  She is on Xarelto for DVT.  She states that she has had some lower abdominal discomfort but it is mild.  She reports that she has had some lightheadedness and dizziness today.  Denies fever.  She has a history of GI bleed and has had endoscopy previously by Dr. Allen Norris.  No other reported symptoms.  No other complaints.  Past Medical History:  Diagnosis Date  . Anxiety disorder   . Dermatitis   . 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 Heart murmur   . Hypercholesterolemia   . Hypertension   . Lymphadenitis   . Renal disorder   . Skin cancer   . Spontaneous ecchymoses   . Vitamin D deficiency     Patient Active Problem List   Diagnosis Date Noted  . On long term prednisone therapy 11/18/2020  . 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 Chronic back pain 11/18/2020  . Leukocytosis 11/18/2020  . Closed left hip fracture, initial encounter ([place]) 11/18/2020  . Closed fracture of multiple pubic rami, left, initial encounter ([place]) 11/18/2020  . Closed intertrochanteric fracture of left femur ([place]) 11/18/2020  . Melena   . Anemia, posthemorrhagic, acute   . Acute on chronic respiratory failure with hypoxia ([place]) 04/11/2020  . 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 Interstitial lung disease ([place]) 04/11/2020  . History of anemia due to chronic kidney disease 04/11/2020  . Hypertensive urgency 04/11/2020  . Granulomatosis with polyangiitis ([place])  03/28/2020  . Pauci-immune Glomerulonephritis   . Acute congestive heart failure ([place])   . AKI (acute kidney injury) ([place])   . Anemia of chronic disease   . Dyspnea 03/16/2020  . Hypokalemia 03/14/2016  . Hyponatremia 03/14/2016  . Aortic stenosis, mild 05/26/2015  . Abnormal EKG 04/14/2015  . Dyspnea on exertion 04/14/2015  . Essential hypertension 04/14/2015    Past Surgical History:  Procedure Laterality Date  . CARPAL TUNNEL RELEASE     [facility]  . ESOPHAGOGASTRODUODENOSCOPY (EGD) WITH PROPOFOL N/A 04/14/2020   Procedure: ESOPHAGOGASTRODUODENOSCOPY (EGD) WITH PROPOFOL;  Surgeon: Lucilla Lame, MD;  Location: [facility] ENDOSCOPY;  Service: Endoscopy;  Laterality: N/A;  . HIP ARTHROPLASTY Left 11/18/2020   Procedure: ARTHROPLASTY BIPOLAR HIP (HEMIARTHROPLASTY);  Surgeon: Corky Mull, MD;  Location: [facility] ORS;  Service: Orthopedics;  Laterality: Left;  . TONSILLECTOMY    . TRANSTHORACIC ECHOCARDIOGRAM  05/01/2015    EF 60-65%. Abnormal relaxation. Mild aortic stenosis (peak gradient 19 mmHg).    OB History   No obstetric history on file.      Home Medications    Prior to Admission medications   Medication Sig Start Date End Date Taking? Authorizing Provider  amLODipine (NORVASC) 5 MG tablet Take 5 mg by mouth daily. 08/29/20   [provider]  atenolol (TENORMIN) 25 MG tablet Take 0.5 tablets (12.5 mg total) by mouth daily. 11/24/20   Jennye Boroughs, MD  clonazePAM (KLONOPIN) 0.5 MG tablet  Take 1 tablet (0.5 mg total) by mouth 2 (two) times daily as needed for anxiety. 11/24/20   Jennye Boroughs, MD  cloNIDine (CATAPRES) 0.2 MG tablet Take 0.2 mg by mouth 3 (three) times daily. 02/08/21   [provider]  enoxaparin (LOVENOX) 40 MG/0.4ML injection Inject 0.4 mLs (40 mg total) into the skin daily. 11/20/20   Lattie Corns, PA-C  Ferrous Sulfate (SLOW FE) 142 (45 Fe) MG TBCR Take 45 mg of iron by mouth daily.    [provider]  furosemide (LASIX) 20 MG  tablet Take 20 mg by mouth every other day. Take 3 times a week on Tuesday, Thursday and Saturday 07/21/20 11/13/21  [provider]  HYDROcodone-acetaminophen (NORCO/VICODIN) 5-325 MG tablet Take 1-2 tablets by mouth every 6 (six) hours as needed for moderate pain. 11/19/20   Lattie Corns, PA-C  losartan (COZAAR) 100 MG tablet Take 100 mg by mouth daily. 12/09/20   [provider]  Nutritional Supplements (FEEDING SUPPLEMENT, NEPRO CARB STEADY,) LIQD Take 237 mLs by mouth daily. 03/29/20   Fritzi Mandes, MD  ondansetron (ZOFRAN) 4 MG tablet Take 1 tablet (4 mg total) by mouth every 6 (six) hours as needed for nausea. 11/19/20   Lattie Corns, PA-C  pantoprazole (PROTONIX) 40 MG tablet Take 1 tablet (40 mg total) by mouth every other day. 11/24/20 11/24/21  Jennye Boroughs, MD  predniSONE (DELTASONE) 10 MG tablet Take 1 tablet (10 mg total) by mouth daily. 07/17/20   Cuthriell, Charline Bills, PA-C  spironolactone (ALDACTONE) 25 MG tablet Take 25 mg by mouth daily. 11/08/20   [provider]  traMADol (ULTRAM) 50 MG tablet Take 1 tablet (50 mg total) by mouth every 6 (six) hours as needed for moderate pain. 11/19/20   Lattie Corns, PA-C  vitamin B-12 1000 MCG tablet Take 1 tablet (1,000 mcg total) by mouth daily. 03/28/20   Fritzi Mandes, MD  XARELTO 20 MG TABS tablet Take 20 mg by mouth every morning. 02/14/21   [provider]    Family History Family History  Problem Relation Age of Onset  . Heart disease Father   . Heart failure Father     Social History Social History   Tobacco Use  . Smoking status: Never Smoker  . Smokeless tobacco: Never Used  Vaping Use  . Vaping Use: Never used  Substance Use Topics  . Alcohol use: No  . Drug use: No     Allergies   Coreg [carvedilol], Statins, and Chlorthalidone   Review of Systems Review of Systems  Constitutional: Negative for fever.  Gastrointestinal: Positive for abdominal pain and anal  bleeding.  Neurological: Positive for dizziness and light-headedness.   Physical Exam Triage Vital Signs ED Triage Vitals  Enc Vitals Group     BP 03/02/21 1010 123/74     Pulse Rate 03/02/21 1010 92     Resp 03/02/21 1010 16     Temp 03/02/21 1010 98 F (36.7 C)     Temp Source 03/02/21 1010 Oral     SpO2 03/02/21 1010 97 %     Weight --      Height --      Head Circumference --      Peak Flow --      Pain Score 03/02/21 1007 0     Pain Loc --      Pain Edu? --      Excl. in [place]? --    Updated Vital Signs BP  123/74 (BP Location: Left Arm)   Pulse 92   Temp 98 F (36.7 C) (Oral)   Resp 16   SpO2 97%   Visual Acuity Right Eye Distance:   Left Eye Distance:   Bilateral Distance:    Right Eye Near:   Left Eye Near:    Bilateral Near:     Physical Exam Vitals and nursing note reviewed. Exam conducted with a chaperone present.  Constitutional:      General: She is not in acute distress.    Appearance: She is not ill-appearing.  HENT:     Head: Normocephalic and atraumatic.  Eyes:     General:        Right eye: No discharge.        Left eye: No discharge.     Conjunctiva/sclera: Conjunctivae normal.  Cardiovascular:     Rate and Rhythm: Normal rate and regular rhythm.  Pulmonary:     Effort: Pulmonary effort is normal.     Breath sounds: Normal breath sounds. No wheezing or rales.  Abdominal:     General: There is no distension.     Palpations: Abdomen is soft.     Tenderness: There is no abdominal tenderness.  Genitourinary:    Comments: Hemorrhoid noted on exam. Neurological:     Mental Status: She is alert.    UC Treatments / Results  Labs (all labs ordered are listed, but only abnormal results are displayed) Labs Reviewed  CBC WITH DIFFERENTIAL/PLATELET - Abnormal; Notable for the following components:      Result Value   WBC 22.4 (*)    Neutro Abs 19.5 (*)    Monocytes Absolute 1.3 (*)    Abs Immature Granulocytes 0.27 (*)    All other  components within normal limits  BASIC METABOLIC PANEL - Abnormal; Notable for the following components:   Sodium 130 (*)    Chloride 92 (*)    Glucose, Bld 158 (*)    BUN 27 (*)    Creatinine, Ser 1.45 (*)    GFR, Estimated 37 (*)    All other components within normal limits    EKG   Radiology No results found.  Procedures Procedures (including critical care time)  Medications Ordered in UC Medications - No data to display  Initial Impression / Assessment and Plan / UC Course  I have reviewed the triage vital signs and the nursing notes.  Pertinent labs & imaging results that were available during my care of the patient were reviewed by me and considered in my medical decision making (see chart for details).    80 year old female presents with bright red blood per rectum.  Hemorrhoids noted on exam.  No significant abdominal tenderness on exam.  Laboratory studies obtained and revealed leukocytosis with a white count of 22.4.  Hemoglobin was 14.9.  AKI noted with a creatinine of 1.45 up from previous of 1.13.  Discussed lab findings and exam findings with patient and son.  I have advised her to go to the hospital for further evaluation.  Will need CT imaging.  She is hemodynamically stable at this time.  She will be transported by her son via private vehicle.  She is going to [facility].  Final Clinical Impressions(s) / UC Diagnoses   Final diagnoses:  Rectal bleeding  AKI (acute kidney injury) ([place])  Leukocytosis, unspecified type     Discharge Instructions     Patient presents with rectal bleeding. Currently on Xarelto.  Hemorrhoids noted on exam.  Endorsing mild lower abdominal pain. WBC count elevated at 22.4. AKI noted as well. Creatinine 1.45. Needs further evaluation with CT, IV fluids and close monitoring.  Thank you  [facility]   ED Prescriptions    None     PDMP not reviewed this encounter.   Coral Spikes, DO 03/02/21  1155

## 2021-03-02 NOTE — ED Notes (Signed)
Patient is being discharged from the Urgent Care and sent to the [facility] Emergency Department via private vehicle with her son. Per Dr. Lacinda Axon, patient is in need of higher level of care due to abnormal labs and findings. Patient is aware and verbalizes understanding of plan of care.  Vitals:   03/02/21 1010  BP: 123/74  Pulse: 92  Resp: 16  Temp: 98 F (36.7 C)  SpO2: 97%

## 2021-03-02 NOTE — Discharge Instructions (Addendum)
Patient presents with rectal bleeding. Currently on Xarelto.  Hemorrhoids noted on exam. Endorsing mild lower abdominal pain. WBC count elevated at 22.4. AKI noted as well. Creatinine 1.45. Needs further evaluation with CT, IV fluids and close monitoring.  Thank you  [facility]

## 2021-03-05 ENCOUNTER — Other Ambulatory Visit: Payer: Self-pay | Admitting: Family Medicine

## 2021-03-05 DIAGNOSIS — R93429 Abnormal radiologic findings on diagnostic imaging of unspecified kidney: Secondary | ICD-10-CM

## 2021-03-06 ENCOUNTER — Ambulatory Visit
Admission: RE | Admit: 2021-03-06 | Discharge: 2021-03-06 | Disposition: A | Discharge status: Home / Self Care | Payer: Medicare HMO | Source: Ambulatory Visit | Attending: Family Medicine | Admitting: Family Medicine

## 2021-03-06 ENCOUNTER — Other Ambulatory Visit: Payer: Self-pay

## 2021-03-06 DIAGNOSIS — R93429 Abnormal radiologic findings on diagnostic imaging of unspecified kidney: Secondary | ICD-10-CM | POA: Diagnosis not present

## 2021-04-10 ENCOUNTER — Telehealth: Payer: Self-pay

## 2021-04-13 NOTE — Progress Notes (Signed)
[facility]  [phone] [phone]  ID: Linda Bradley OB: [birth year]  MR#: [id number]  ELF#:[id number]  Patient Care Team: Lynnell Jude, MD as PCP - General (Family Medicine)  CHIEF COMPLAINT: Acute glomerulonephritis  INTERVAL HISTORY: Patient is a 80 year old female who was referred for Rituxan for treatment of acute glomerulonephritis.  She currently feels well and is asymptomatic.  She does not complain of any weakness or fatigue.  She has a good appetite and denies weight loss.  She has no neurologic complaints.  She denies any chest pain, shortness of breath, cough, or hemoptysis.  She denies any nausea, vomiting, constipation, diarrhea.  She has no urinary complaints.  Patient offers no specific complaints today.  REVIEW OF SYSTEMS:   Review of Systems  Constitutional: Negative.  Negative for fever, malaise/fatigue and weight loss.  Respiratory: Negative.  Negative for cough, hemoptysis and shortness of breath.   Cardiovascular: Negative.  Negative for chest pain and leg swelling.  Gastrointestinal: Negative.  Negative for abdominal pain.  Genitourinary: Negative.  Negative for dysuria.  Musculoskeletal: Negative.  Negative for back pain.  Skin: Negative.  Negative for rash.  Neurological: Negative.  Negative for dizziness, focal weakness, weakness and headaches.  Psychiatric/Behavioral: Negative.  The patient is not nervous/anxious.     As per HPI. Otherwise, a complete review of systems is negative.  PAST MEDICAL HISTORY: Past Medical History:  Diagnosis Date  . Anxiety disorder   . Dermatitis   . 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 Heart murmur   . Hypercholesterolemia   . Hypertension   . Lymphadenitis   . Renal disorder   . Skin cancer   . Spontaneous ecchymoses   . Vitamin D deficiency     PAST SURGICAL HISTORY: Past Surgical History:  Procedure Laterality Date  . CARPAL TUNNEL RELEASE     [facility]   . ESOPHAGOGASTRODUODENOSCOPY (EGD) WITH PROPOFOL N/A 04/14/2020   Procedure: ESOPHAGOGASTRODUODENOSCOPY (EGD) WITH PROPOFOL;  Surgeon: Lucilla Lame, MD;  Location: [facility] ENDOSCOPY;  Service: Endoscopy;  Laterality: N/A;  . HIP ARTHROPLASTY Left 11/18/2020   Procedure: ARTHROPLASTY BIPOLAR HIP (HEMIARTHROPLASTY);  Surgeon: Corky Mull, MD;  Location: [facility] ORS;  Service: Orthopedics;  Laterality: Left;  . TONSILLECTOMY    . TRANSTHORACIC ECHOCARDIOGRAM  05/01/2015    EF 60-65%. Abnormal relaxation. Mild aortic stenosis (peak gradient 19 mmHg).    FAMILY HISTORY: Family History  Problem Relation Age of Onset  . Heart disease Father   . Heart failure Father     ADVANCED DIRECTIVES (Y/N):  N  HEALTH MAINTENANCE: Social History   Tobacco Use  . Smoking status: Never Smoker  . Smokeless tobacco: Never Used  Vaping Use  . Vaping Use: Never used  Substance Use Topics  . Alcohol use: No  . Drug use: No     Colonoscopy:  PAP:  Bone density:  Lipid panel:  Allergies  Allergen Reactions  . Coreg [Carvedilol] Nausea Only and Other (See Comments)    Syncope  . Statins Other (See Comments)    Other Reaction: CNS DISORDER  . Chlorthalidone Diarrhea    Current Outpatient Medications  Medication Sig Dispense Refill  . amLODipine (NORVASC) 5 MG tablet Take 5 mg by mouth daily.    Marland Kitchen atenolol (TENORMIN) 25 MG tablet Take 0.5 tablets (12.5 mg total) by mouth daily.    . clonazePAM (KLONOPIN) 0.5 MG tablet Take 1 tablet (0.5 mg total) by mouth 2 (two) times daily as needed for anxiety.  10 tablet 0  . cloNIDine (CATAPRES) 0.2 MG tablet Take 0.2 mg by mouth 3 (three) times daily.    . Ferrous Sulfate (SLOW FE) 142 (45 Fe) MG TBCR Take 45 mg of iron by mouth daily.    . furosemide (LASIX) 20 MG tablet Take 20 mg by mouth every other day. Take 3 times a week on Tuesday, Thursday and Saturday    . losartan (COZAAR) 100 MG tablet Take 100 mg by mouth daily.    . predniSONE (DELTASONE) 10 MG  tablet Take 1 tablet (10 mg total) by mouth daily. 42 tablet 0  . vitamin B-12 1000 MCG tablet Take 1 tablet (1,000 mcg total) by mouth daily. 30 tablet 0  . XARELTO 20 MG TABS tablet Take 20 mg by mouth every morning.    . enoxaparin (LOVENOX) 40 MG/0.4ML injection Inject 0.4 mLs (40 mg total) into the skin daily. 5.6 mL 0   No current facility-administered medications for this visit.    OBJECTIVE: Vitals:   04/18/21 0843  BP: 118/76  Pulse: 75  Resp: 16  Temp: (!) 97 F (36.1 C)  SpO2: 97%     Body mass index is 29.29 kg/m.    ECOG FS:0 - Asymptomatic  General: Well-developed, well-nourished, no acute distress. Eyes: Pink conjunctiva, anicteric sclera. HEENT: Normocephalic, moist mucous membranes. Lungs: No audible wheezing or coughing. Heart: Regular rate and rhythm. Abdomen: Soft, nontender, no obvious distention. Musculoskeletal: No edema, cyanosis, or clubbing. Neuro: Alert, answering all questions appropriately. Cranial nerves grossly intact. Skin: No rashes or petechiae noted. Psych: Normal affect. Lymphatics: No cervical, calvicular, axillary or inguinal LAD.   LAB RESULTS:  Lab Results  Component Value Date   NA 130 (L) 03/02/2021   K 5.1 03/02/2021   CL 92 (L) 03/02/2021   CO2 25 03/02/2021   GLUCOSE 158 (H) 03/02/2021   BUN 27 (H) 03/02/2021   CREATININE 1.45 (H) 03/02/2021   CALCIUM 9.6 03/02/2021   PROT 5.8 (L) 03/15/2016   ALBUMIN 3.0 (L) 04/14/2020   AST 38 03/15/2016   ALT 30 03/15/2016   ALKPHOS 59 03/15/2016   BILITOT 1.3 (H) 03/15/2016   GFRNONAA 37 (L) 03/02/2021   GFRAA 43 (L) 08/15/2020    Lab Results  Component Value Date   WBC 22.4 (H) 03/02/2021   NEUTROABS 19.5 (H) 03/02/2021   HGB 14.9 03/02/2021   HCT 42.9 03/02/2021   MCV 89.6 03/02/2021   PLT 330 03/02/2021     STUDIES: No results found.  ASSESSMENT: Acute glomerulonephritis.   PLAN:    1. Acute glomerulonephritis: Proceed with 1000 mg IV Rituxan.  Patient also  receives 100 mg Solu-Medrol.  No further intervention is needed at this time.  Patient does not require second dose does not need to follow-up in the cancer center at this time.  She has been instructed to continue follow-up with nephrology as scheduled.  Please refer patient back if she requires additional treatment.  I spent a total of 30 minutes reviewing chart data, face-to-face evaluation with the patient, counseling and coordination of care as detailed above.   Patient expressed understanding and was in agreement with this plan. She also understands that She can call clinic at any time with any questions, concerns, or complaints.    Lloyd Huger, MD   04/19/2021 6:12 AM

## 2021-04-18 ENCOUNTER — Encounter: Payer: Self-pay | Admitting: Oncology

## 2021-04-18 ENCOUNTER — Inpatient Hospital Stay: Payer: Medicare HMO | Admitting: Oncology

## 2021-04-18 ENCOUNTER — Inpatient Hospital Stay: Payer: Medicare HMO | Attending: Oncology

## 2021-04-18 ENCOUNTER — Other Ambulatory Visit: Payer: Self-pay

## 2021-04-18 ENCOUNTER — Inpatient Hospital Stay: Payer: Medicare HMO

## 2021-04-18 VITALS — BP 152/89 | HR 86 | Temp 97.5°F | Resp 16

## 2021-04-18 VITALS — BP 118/76 | HR 75 | Temp 97.0°F | Resp 16 | Ht 60.0 in | Wt 150.0 lb

## 2021-04-18 DIAGNOSIS — N009 Acute nephritic syndrome with unspecified morphologic changes: Secondary | ICD-10-CM | POA: Diagnosis not present

## 2021-04-18 DIAGNOSIS — M3131 Wegener's granulomatosis with renal involvement: Secondary | ICD-10-CM | POA: Diagnosis not present

## 2021-04-18 DIAGNOSIS — N059 Unspecified nephritic syndrome with unspecified morphologic changes: Secondary | ICD-10-CM

## 2021-04-18 DIAGNOSIS — N1832 Chronic kidney disease, stage 3b: Secondary | ICD-10-CM | POA: Diagnosis present

## 2021-04-18 MED ORDER — SODIUM CHLORIDE 0.9 % IV SOLN
1000.0000 mg | Freq: Once | INTRAVENOUS | Status: AC
  Administered 2021-04-18: 1000 mg via INTRAVENOUS
  Filled 2021-04-18: qty 100

## 2021-04-18 MED ORDER — ACETAMINOPHEN 325 MG PO TABS
650.0000 mg | ORAL_TABLET | Freq: Once | ORAL | Status: AC
  Administered 2021-04-18: 650 mg via ORAL
  Filled 2021-04-18: qty 2

## 2021-04-18 MED ORDER — METHYLPREDNISOLONE SODIUM SUCC 125 MG IJ SOLR
100.0000 mg | Freq: Once | INTRAMUSCULAR | Status: AC
  Administered 2021-04-18: 100 mg via INTRAVENOUS
  Filled 2021-04-18: qty 2

## 2021-04-18 MED ORDER — DIPHENHYDRAMINE HCL 50 MG/ML IJ SOLN
25.0000 mg | Freq: Once | INTRAMUSCULAR | Status: AC
  Administered 2021-04-18: 25 mg via INTRAVENOUS
  Filled 2021-04-18: qty 1

## 2021-04-18 MED ORDER — SODIUM CHLORIDE 0.9 % IV SOLN
Freq: Once | INTRAVENOUS | Status: AC
  Filled 2021-04-18: qty 250

## 2021-04-18 NOTE — Patient Instructions (Signed)
[facility]  Discharge Instructions: Thank you for choosing [facility] to provide your oncology and hematology care.  If you have a lab appointment with the [facility], please go directly to the [facility] and check in at the registration area.  Wear comfortable clothing and clothing appropriate for easy access to any Portacath or PICC line.   We strive to give you quality time with your provider. You may need to reschedule your appointment if you arrive late (15 or more minutes).  Arriving late affects you and other patients whose appointments are after yours.  Also, if you miss three or more appointments without notifying the office, you may be dismissed from the clinic at the provider's discretion.      For prescription refill requests, have your pharmacy contact our office and allow 72 hours for refills to be completed.    Today you received the following chemotherapy and/or immunotherapy agents RituxanRituximab Injection What is this medicine? RITUXIMAB (ri TUX i mab) is a monoclonal antibody. It is used to treat certain types of cancer like non-Hodgkin lymphoma and chronic lymphocytic leukemia. It is also used to treat rheumatoid arthritis, granulomatosis with polyangiitis, microscopic polyangiitis, and pemphigus vulgaris. This medicine may be used for other purposes; ask your health care provider or pharmacist if you have questions. COMMON BRAND NAME(S): RIABNI, Rituxan, RUXIENCE What should I tell my health care provider before I take this medicine? They need to know if you have any of these conditions:  chest pain  heart disease  infection especially a viral infection such as chickenpox, cold sores, hepatitis B, or herpes  immune system problems  irregular heartbeat or rhythm  kidney disease  low blood counts (white cells, platelets, or red cells)  lung disease  recent or upcoming vaccine  an unusual or allergic  reaction to rituximab, other medicines, foods, dyes, or preservatives  pregnant or trying to get pregnant  breast-feeding How should I use this medicine? This medicine is injected into a vein. It is given by a health care provider in a hospital or clinic setting. A special MedGuide will be given to you before each treatment. Be sure to read this information carefully each time. Talk to your health care provider about the use of this medicine in children. While this drug may be prescribed for children as young as 2 years for selected conditions, precautions do apply. Overdosage: If you think you have taken too much of this medicine contact a poison control center or emergency room at once. NOTE: This medicine is only for you. Do not share this medicine with others. What if I miss a dose? Keep appointments for follow-up doses. It is important not to miss your dose. Call your health care provider if you are unable to keep an appointment. What may interact with this medicine? Do not take this medicine with any of the following medicines:  live vaccines This medicine may also interact with the following medicines:  cisplatin This list may not describe all possible interactions. Give your health care provider a list of all the medicines, herbs, non-prescription drugs, or dietary supplements you use. Also tell them if you smoke, drink alcohol, or use illegal drugs. Some items may interact with your medicine. What should I watch for while using this medicine? Your condition will be monitored carefully while you are receiving this medicine. You may need blood work done while you are taking this medicine. This medicine can cause serious infusion  reactions. To reduce the risk your health care provider may give you other medicines to take before receiving this one. Be sure to follow the directions from your health care provider. This medicine may increase your risk of getting an infection. Call your  health care provider for advice if you get a fever, chills, sore throat, or other symptoms of a cold or flu. Do not treat yourself. Try to avoid being around people who are sick. Call your health care provider if you are around anyone with measles, chickenpox, or if you develop sores or blisters that do not heal properly. Avoid taking medicines that contain aspirin, acetaminophen, ibuprofen, naproxen, or ketoprofen unless instructed by your health care provider. These medicines may hide a fever. This medicine may cause serious skin reactions. They can happen weeks to months after starting the medicine. Contact your health care provider right away if you notice fevers or flu-like symptoms with a rash. The rash may be red or purple and then turn into blisters or peeling of the skin. Or, you might notice a red rash with swelling of the face, lips or lymph nodes in your neck or under your arms. In some patients, this medicine may cause a serious brain infection that may cause death. If you have any problems seeing, thinking, speaking, walking, or standing, tell your healthcare professional right away. If you cannot reach your healthcare professional, urgently seek other source of medical care. Do not become pregnant while taking this medicine or for at least 12 months after stopping it. Women should inform their health care provider if they wish to become pregnant or think they might be pregnant. There is potential for serious harm to an unborn child. Talk to your health care provider for more information. Women should use a reliable form of birth control while taking this medicine and for 12 months after stopping it. Do not breast-feed while taking this medicine or for at least 6 months after stopping it. What side effects may I notice from receiving this medicine? Side effects that you should report to your health care provider as soon as possible:  allergic reactions (skin rash, itching or hives; swelling of  the face, lips, or tongue)  diarrhea  edema (sudden weight gain; swelling of the ankles, feet, hands or other unusual swelling; trouble breathing)  fast, irregular heartbeat  heart attack (trouble breathing; pain or tightness in the chest, neck, back or arms; unusually weak or tired)  infection (fever, chills, cough, sore throat, pain or trouble passing urine)  kidney injury (trouble passing urine or change in the amount of urine)  liver injury (dark yellow or brown urine; general ill feeling or flu-like symptoms; loss of appetite, right upper belly pain; unusually weak or tired, yellowing of the eyes or skin)  low blood pressure (dizziness; feeling faint or lightheaded, falls; unusually weak or tired)  low red blood cell counts (trouble breathing; feeling faint; lightheaded, falls; unusually weak or tired)  mouth sores  redness, blistering, peeling, or loosening of the skin, including inside the mouth  stomach pain  unusual bruising or bleeding  wheezing (trouble breathing with loud or whistling sounds)  vomiting Side effects that usually do not require medical attention (report to your health care provider if they continue or are bothersome):  headache  joint pain  muscle cramps, pain  nausea This list may not describe all possible side effects. Call your doctor for medical advice about side effects. You may report side effects to FDA at  1-800-FDA-1088. Where should I keep my medicine? This medicine is given in a hospital or clinic. It will not be stored at home. NOTE: This sheet is a summary. It may not cover all possible information. If you have questions about this medicine, talk to your doctor, pharmacist, or health care provider.  2021 Elsevier/Gold Standard (2020-09-21 21:35:50)       To help prevent nausea and vomiting after your treatment, we encourage you to take your nausea medication as directed.  BELOW ARE SYMPTOMS THAT SHOULD BE REPORTED  IMMEDIATELY: . *FEVER GREATER THAN 100.4 F (38 C) OR HIGHER . *CHILLS OR SWEATING . *NAUSEA AND VOMITING THAT IS NOT CONTROLLED WITH YOUR NAUSEA MEDICATION . *UNUSUAL SHORTNESS OF BREATH . *UNUSUAL BRUISING OR BLEEDING . *URINARY PROBLEMS (pain or burning when urinating, or frequent urination) . *BOWEL PROBLEMS (unusual diarrhea, constipation, pain near the anus) . TENDERNESS IN MOUTH AND THROAT WITH OR WITHOUT PRESENCE OF ULCERS (sore throat, sores in mouth, or a toothache) . UNUSUAL RASH, SWELLING OR PAIN  . UNUSUAL VAGINAL DISCHARGE OR ITCHING   Items with * indicate a potential emergency and should be followed up as soon as possible or go to the Emergency Department if any problems should occur.  Please show the CHEMOTHERAPY ALERT CARD or IMMUNOTHERAPY ALERT CARD at check-in to the Emergency Department and triage nurse.  Should you have questions after your visit or need to cancel or reschedule your appointment, please contact [facility]  [phone] and follow the prompts.  Office hours are 8:00 a.m. to 4:30 p.m. Monday - Friday. Please note that voicemails left after 4:00 p.m. may not be returned until the following business day.  We are closed weekends and major holidays. You have access to a nurse at all times for urgent questions. Please call the main number to the clinic [phone] and follow the prompts.  For any non-urgent questions, you may also contact your provider using MyChart. We now offer e-Visits for anyone 41 and older to request care online for non-urgent symptoms. For details visit [email or web address].   Also download the MyChart app! Go to the app store, search "MyChart", open the app, select [facility], and log in with your MyChart username and password.  Due to Covid, a mask is required upon entering the hospital/clinic. If you do not have a mask, one will be given to you upon arrival. For doctor visits, patients may have 1  support person aged 32 or older with them. For treatment visits, patients cannot have anyone with them due to current Covid guidelines and our immunocompromised population.

## 2021-04-18 NOTE — Progress Notes (Signed)
Patient tolerated first Rituxan infusion well. Denies any questions or concerns. Discharged home.

## 2021-04-19 NOTE — Telephone Encounter (Signed)
Phone note 

## 2021-08-08 ENCOUNTER — Other Ambulatory Visit: Payer: Self-pay | Admitting: Nephrology

## 2021-08-08 DIAGNOSIS — N2889 Other specified disorders of kidney and ureter: Secondary | ICD-10-CM

## 2021-08-23 ENCOUNTER — Other Ambulatory Visit: Payer: Self-pay

## 2021-08-23 ENCOUNTER — Ambulatory Visit
Admission: RE | Admit: 2021-08-23 | Discharge: 2021-08-23 | Disposition: A | Discharge status: Home / Self Care | Payer: Medicare HMO | Source: Ambulatory Visit | Attending: Nephrology | Admitting: Nephrology

## 2021-08-23 DIAGNOSIS — N2889 Other specified disorders of kidney and ureter: Secondary | ICD-10-CM | POA: Diagnosis not present

## 2021-12-05 ENCOUNTER — Inpatient Hospital Stay: Payer: Medicare HMO | Attending: Oncology | Admitting: Oncology

## 2021-12-05 ENCOUNTER — Other Ambulatory Visit: Payer: Self-pay

## 2021-12-05 ENCOUNTER — Inpatient Hospital Stay: Payer: Medicare HMO

## 2021-12-05 VITALS — BP 162/89 | HR 78 | Temp 97.6°F | Resp 18 | Wt 152.0 lb

## 2021-12-05 DIAGNOSIS — D72829 Elevated white blood cell count, unspecified: Secondary | ICD-10-CM

## 2021-12-05 DIAGNOSIS — D751 Secondary polycythemia: Secondary | ICD-10-CM

## 2021-12-05 DIAGNOSIS — N059 Unspecified nephritic syndrome with unspecified morphologic changes: Secondary | ICD-10-CM | POA: Diagnosis not present

## 2021-12-05 DIAGNOSIS — Z862 Personal history of diseases of the blood and blood-forming organs and certain disorders involving the immune mechanism: Secondary | ICD-10-CM | POA: Diagnosis not present

## 2021-12-05 LAB — IRON AND TIBC
Iron: 64 ug/dL (ref 28–170)
Saturation Ratios: 22 % (ref 10.4–31.8)
TIBC: 293 ug/dL (ref 250–450)
UIBC: 229 ug/dL

## 2021-12-05 LAB — CBC
HCT: 45.5 % (ref 36.0–46.0)
Hemoglobin: 15.4 g/dL — ABNORMAL HIGH (ref 12.0–15.0)
MCH: 29.8 pg (ref 26.0–34.0)
MCHC: 33.8 g/dL (ref 30.0–36.0)
MCV: 88.2 fL (ref 80.0–100.0)
Platelets: 285 10*3/uL (ref 150–400)
RBC: 5.16 MIL/uL — ABNORMAL HIGH (ref 3.87–5.11)
RDW: 13.4 % (ref 11.5–15.5)
WBC: 17.8 10*3/uL — ABNORMAL HIGH (ref 4.0–10.5)
nRBC: 0 % (ref 0.0–0.2)

## 2021-12-05 LAB — FERRITIN: Ferritin: 251 ng/mL (ref 11–307)

## 2021-12-05 NOTE — Progress Notes (Signed)
Waves of nausea, SOB and Tired on exertion, hurt her back a couple days ago.

## 2021-12-06 ENCOUNTER — Encounter: Payer: Self-pay | Admitting: Oncology

## 2021-12-06 LAB — CARBON MONOXIDE, BLOOD (PERFORMED AT REF LAB): Carbon Monoxide, Blood: 2.1 % (ref 0.0–3.6)

## 2021-12-06 LAB — ERYTHROPOIETIN: Erythropoietin: 7.1 m[IU]/mL (ref 2.6–18.5)

## 2021-12-06 NOTE — Progress Notes (Signed)
[facility]  Telephone:(336) [phone] Fax:(336) [phone]  ID: Linda Bradley OB: 18-Feb-1941  MR#: [id number]  ACZ#:[id number]  Patient Care Team: Lynnell Jude, MD as PCP - General (Family Medicine)  CHIEF COMPLAINT: Polycythemia  INTERVAL HISTORY: Patient is a 80 year old female who was previously seen in clinic for Rituxan infusion needed for glomerulonephritis.  She is now referred for increasing hemoglobin.  She currently feels well and is asymptomatic.  She has no neurologic complaints.  She denies any recent fevers or illnesses.  She has a good appetite and denies weight loss.  She has no chest pain, shortness of breath, cough, or hemoptysis.  She denies any nausea, vomiting, constipation, or diarrhea.  She has no urinary complaints.  Patient feels at her baseline offers no specific complaints today.  REVIEW OF SYSTEMS:   Review of Systems  Constitutional: Negative.  Negative for fever, malaise/fatigue and weight loss.  Respiratory: Negative.  Negative for cough, hemoptysis and shortness of breath.   Cardiovascular: Negative.  Negative for chest pain and leg swelling.  Gastrointestinal: Negative.  Negative for abdominal pain.  Genitourinary: Negative.  Negative for dysuria.  Musculoskeletal: Negative.  Negative for back pain.  Skin: Negative.  Negative for rash.  Neurological: Negative.  Negative for dizziness, seizures, weakness and headaches.  Psychiatric/Behavioral: Negative.  The patient is not nervous/anxious.    As per HPI. Otherwise, a complete review of systems is negative.  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   Heart murmur    Hypercholesterolemia    Hypertension    Lymphadenitis    Renal disorder    Skin cancer    Spontaneous ecchymoses    Vitamin D deficiency     PAST SURGICAL HISTORY: Past Surgical History:  Procedure  Laterality Date   CARPAL TUNNEL RELEASE     [facility]   ESOPHAGOGASTRODUODENOSCOPY (EGD) WITH PROPOFOL N/A 04/14/2020   Procedure: ESOPHAGOGASTRODUODENOSCOPY (EGD) WITH PROPOFOL;  Surgeon: Lucilla Lame, MD;  Location: [facility] ENDOSCOPY;  Service: Endoscopy;  Laterality: N/A;   HIP ARTHROPLASTY Left 11/18/2020   Procedure: ARTHROPLASTY BIPOLAR HIP (HEMIARTHROPLASTY);  Surgeon: Corky Mull, MD;  Location: [facility] ORS;  Service: Orthopedics;  Laterality: Left;   TONSILLECTOMY     TRANSTHORACIC ECHOCARDIOGRAM  05/01/2015    EF 60-65%. Abnormal relaxation. Mild aortic stenosis (peak gradient 19 mmHg).    FAMILY HISTORY: Family History  Problem Relation Age of Onset   Heart disease Father    Heart failure Father     ADVANCED DIRECTIVES (Y/N):  N  HEALTH MAINTENANCE: Social History   Tobacco Use   Smoking status: Never   Smokeless tobacco: Never  Vaping Use   Vaping Use: Never used  Substance Use Topics   Alcohol use: No   Drug use: No     Colonoscopy:  PAP:  Bone density:  Lipid panel:  Allergies  Allergen Reactions   Influenza Vaccines Anaphylaxis   Coreg [Carvedilol] Nausea Only and Other (See Comments)    Syncope   Statins Other (See Comments)    Other Reaction: CNS DISORDER   Chlorthalidone Diarrhea    Current Outpatient Medications  Medication Sig Dispense Refill   amLODipine (NORVASC) 5 MG tablet Take 5 mg by mouth daily.     aspirin 81 MG chewable tablet Chew by mouth.     atenolol (TENORMIN) 25 MG tablet Take 0.5 tablets (12.5 mg total) by mouth daily.  clonazePAM (KLONOPIN) 0.5 MG tablet Take 1 tablet (0.5 mg total) by mouth 2 (two) times daily as needed for anxiety. 10 tablet 0   cloNIDine (CATAPRES) 0.2 MG tablet Take 0.2 mg by mouth 3 (three) times daily.     Ferrous Sulfate (SLOW FE) 142 (45 Fe) MG TBCR Take 45 mg of iron by mouth daily.     furosemide (LASIX) 20 MG tablet Take by mouth.     losartan (COZAAR) 100 MG tablet Take 100 mg by mouth daily.      predniSONE (DELTASONE) 10 MG tablet Take 1 tablet (10 mg total) by mouth daily. 42 tablet 0   vitamin B-12 1000 MCG tablet Take 1 tablet (1,000 mcg total) by mouth daily. 30 tablet 0   No current facility-administered medications for this visit.    OBJECTIVE: Vitals:   12/05/21 0925  BP: (!) 162/89  Pulse: 78  Resp: 18  Temp: 97.6 F (36.4 C)  SpO2: 94%     Body mass index is 29.69 kg/m.    ECOG FS:0 - Asymptomatic  General: Well-developed, well-nourished, no acute distress.  Sitting in a wheelchair. Eyes: Pink conjunctiva, anicteric sclera. HEENT: Normocephalic, moist mucous membranes. Lungs: No audible wheezing or coughing. Heart: Regular rate and rhythm. Abdomen: Soft, nontender, no obvious distention. Musculoskeletal: No edema, cyanosis, or clubbing. Neuro: Alert, answering all questions appropriately. Cranial nerves grossly intact. Skin: No rashes or petechiae noted. Psych: Normal affect. Lymphatics: No cervical, calvicular, axillary or inguinal LAD.   LAB RESULTS:  Lab Results  Component Value Date   NA 130 (L) 03/02/2021   K 5.1 03/02/2021   CL 92 (L) 03/02/2021   CO2 25 03/02/2021   GLUCOSE 158 (H) 03/02/2021   BUN 27 (H) 03/02/2021   CREATININE 1.45 (H) 03/02/2021   CALCIUM 9.6 03/02/2021   PROT 5.8 (L) 03/15/2016   ALBUMIN 3.0 (L) 04/14/2020   AST 38 03/15/2016   ALT 30 03/15/2016   ALKPHOS 59 03/15/2016   BILITOT 1.3 (H) 03/15/2016   GFRNONAA 37 (L) 03/02/2021   GFRAA 43 (L) 08/15/2020    Lab Results  Component Value Date   WBC 17.8 (H) 12/05/2021   NEUTROABS 19.5 (H) 03/02/2021   HGB 15.4 (H) 12/05/2021   HCT 45.5 12/05/2021   MCV 88.2 12/05/2021   PLT 285 12/05/2021     STUDIES: No results found.  ASSESSMENT: Polycythemia.  PLAN:    1.  Polycythemia: Patient's hemoglobin remains mildly elevated at 15.4 today.  Iron stores are within normal limits.  Erythropoietin, carbon oxide, hemochromatosis mutation, and JAK2 mutation are all  pending at time of dictation.  No intervention is needed at this time.  Patient does not require phlebotomy.  Return to clinic in 1 month with repeat laboratory work, further evaluation, and discussion of her laboratory results. 2.  Leukocytosis: Patient appears to have a chronically elevated white blood cell count.  We will get BCR-able and peripheral blood flow cytometry with next lab draw. 3.  Glomerulonephritis: Continue follow-up and treatment per nephrology.  I spent a total of 45 minutes reviewing chart data, face-to-face evaluation with the patient, counseling and coordination of care as detailed above.   Patient expressed understanding and was in agreement with this plan. She also understands that She can call clinic at any time with any questions, concerns, or complaints.     Lloyd Huger, MD   12/06/2021 5:44 AM

## 2021-12-10 LAB — HEMOCHROMATOSIS DNA-PCR(C282Y,H63D)

## 2021-12-11 LAB — JAK2 GENOTYPR

## 2022-01-02 ENCOUNTER — Encounter: Payer: Self-pay | Admitting: Ophthalmology

## 2022-01-02 ENCOUNTER — Other Ambulatory Visit: Payer: Self-pay

## 2022-01-07 NOTE — Progress Notes (Deleted)
[facility]  Telephone:(336) [phone] Fax:(336) [phone]  ID: [name] OB: 10/15/1941  MR#: [id number]  [facility]#:[id number]  Patient Care Team: Lynnell Jude, MD as PCP - General (Family Medicine)  CHIEF COMPLAINT: Polycythemia  INTERVAL HISTORY: Patient is a 81 year old female who was previously seen in clinic for Rituxan infusion needed for glomerulonephritis.  She is now referred for increasing hemoglobin.  She currently feels well and is asymptomatic.  She has no neurologic complaints.  She denies any recent fevers or illnesses.  She has a good appetite and denies weight loss.  She has no chest pain, shortness of breath, cough, or hemoptysis.  She denies any nausea, vomiting, constipation, or diarrhea.  She has no urinary complaints.  Patient feels at her baseline offers no specific complaints today.  REVIEW OF SYSTEMS:   Review of Systems  Constitutional: Negative.  Negative for fever, malaise/fatigue and weight loss.  Respiratory: Negative.  Negative for cough, hemoptysis and shortness of breath.   Cardiovascular: Negative.  Negative for chest pain and leg swelling.  Gastrointestinal: Negative.  Negative for abdominal pain.  Genitourinary: Negative.  Negative for dysuria.  Musculoskeletal: Negative.  Negative for back pain.  Skin: Negative.  Negative for rash.  Neurological: Negative.  Negative for dizziness, seizures, weakness and headaches.  Psychiatric/Behavioral: Negative.  The patient is not nervous/anxious.    As per HPI. Otherwise, a complete review of systems is negative.  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   Heart murmur    Hypercholesterolemia    Hypertension    Lymphadenitis    Renal disorder    Skin cancer    Spontaneous ecchymoses    Vitamin D deficiency     PAST SURGICAL HISTORY: Past Surgical History:  Procedure  Laterality Date   CARPAL TUNNEL RELEASE     [facility]   ESOPHAGOGASTRODUODENOSCOPY (EGD) WITH PROPOFOL N/A 04/14/2020   Procedure: ESOPHAGOGASTRODUODENOSCOPY (EGD) WITH PROPOFOL;  Surgeon: Lucilla Lame, MD;  Location: [facility] ENDOSCOPY;  Service: Endoscopy;  Laterality: N/A;   HIP ARTHROPLASTY Left 11/18/2020   Procedure: ARTHROPLASTY BIPOLAR HIP (HEMIARTHROPLASTY);  Surgeon: Corky Mull, MD;  Location: [facility] ORS;  Service: Orthopedics;  Laterality: Left;   TONSILLECTOMY     TRANSTHORACIC ECHOCARDIOGRAM  05/01/2015    EF 60-65%. Abnormal relaxation. Mild aortic stenosis (peak gradient 19 mmHg).    FAMILY HISTORY: Family History  Problem Relation Age of Onset   Heart disease Father    Heart failure Father     ADVANCED DIRECTIVES (Y/N):  N  HEALTH MAINTENANCE: Social History   Tobacco Use   Smoking status: Never   Smokeless tobacco: Never  Vaping Use   Vaping Use: Never used  Substance Use Topics   Alcohol use: No   Drug use: No     Colonoscopy:  PAP:  Bone density:  Lipid panel:  Allergies  Allergen Reactions   Influenza Vaccines Anaphylaxis   Coreg [Carvedilol] Nausea Only and Other (See Comments)    Syncope   Statins Other (See Comments)    Other Reaction: CNS DISORDER   Chlorthalidone Diarrhea    Current Outpatient Medications  Medication Sig Dispense Refill   amLODipine (NORVASC) 5 MG tablet Take 5 mg by mouth daily.     aspirin 81 MG chewable tablet Chew by mouth.     atenolol (TENORMIN) 25 MG tablet Take 0.5 tablets (12.5 mg total) by mouth daily.  clonazePAM (KLONOPIN) 0.5 MG tablet Take 1 tablet (0.5 mg total) by mouth 2 (two) times daily as needed for anxiety. 10 tablet 0   cloNIDine (CATAPRES) 0.2 MG tablet Take 0.2 mg by mouth 3 (three) times daily.     Ferrous Sulfate (SLOW FE) 142 (45 Fe) MG TBCR Take 45 mg of iron by mouth daily.     furosemide (LASIX) 20 MG tablet Take by mouth.     losartan (COZAAR) 100 MG tablet Take 100 mg by mouth daily.      predniSONE (DELTASONE) 10 MG tablet Take 1 tablet (10 mg total) by mouth daily. 42 tablet 0   vitamin B-12 1000 MCG tablet Take 1 tablet (1,000 mcg total) by mouth daily. 30 tablet 0   No current facility-administered medications for this visit.    OBJECTIVE: There were no vitals filed for this visit.    There is no height or weight on file to calculate BMI.    ECOG FS:0 - Asymptomatic  General: Well-developed, well-nourished, no acute distress.  Sitting in a wheelchair. Eyes: Pink conjunctiva, anicteric sclera. HEENT: Normocephalic, moist mucous membranes. Lungs: No audible wheezing or coughing. Heart: Regular rate and rhythm. Abdomen: Soft, nontender, no obvious distention. Musculoskeletal: No edema, cyanosis, or clubbing. Neuro: Alert, answering all questions appropriately. Cranial nerves grossly intact. Skin: No rashes or petechiae noted. Psych: Normal affect. Lymphatics: No cervical, calvicular, axillary or inguinal LAD.   LAB RESULTS:  Lab Results  Component Value Date   NA 130 (L) 03/02/2021   K 5.1 03/02/2021   CL 92 (L) 03/02/2021   CO2 25 03/02/2021   GLUCOSE 158 (H) 03/02/2021   BUN 27 (H) 03/02/2021   CREATININE 1.45 (H) 03/02/2021   CALCIUM 9.6 03/02/2021   PROT 5.8 (L) 03/15/2016   ALBUMIN 3.0 (L) 04/14/2020   AST 38 03/15/2016   ALT 30 03/15/2016   ALKPHOS 59 03/15/2016   BILITOT 1.3 (H) 03/15/2016   GFRNONAA 37 (L) 03/02/2021   GFRAA 43 (L) 08/15/2020    Lab Results  Component Value Date   WBC 17.8 (H) 12/05/2021   NEUTROABS 19.5 (H) 03/02/2021   HGB 15.4 (H) 12/05/2021   HCT 45.5 12/05/2021   MCV 88.2 12/05/2021   PLT 285 12/05/2021     STUDIES: No results found.  ASSESSMENT: Polycythemia.  PLAN:    1.  Polycythemia: Patient's hemoglobin remains mildly elevated at 15.4 today.  Iron stores are within normal limits.  Erythropoietin, carbon oxide, hemochromatosis mutation, and JAK2 mutation are all pending at time of dictation.  No  intervention is needed at this time.  Patient does not require phlebotomy.  Return to clinic in 1 month with repeat laboratory work, further evaluation, and discussion of her laboratory results. 2.  Leukocytosis: Patient appears to have a chronically elevated white blood cell count.  We will get BCR-able and peripheral blood flow cytometry with next lab draw. 3.  Glomerulonephritis: Continue follow-up and treatment per nephrology.  I spent a total of 45 minutes reviewing chart data, face-to-face evaluation with the patient, counseling and coordination of care as detailed above.   Patient expressed understanding and was in agreement with this plan. She also understands that She can call clinic at any time with any questions, concerns, or complaints.     Lloyd Huger, MD   01/07/2022 3:35 PM

## 2022-01-08 ENCOUNTER — Encounter: Payer: Self-pay | Admitting: Oncology

## 2022-01-08 ENCOUNTER — Inpatient Hospital Stay: Payer: Medicare HMO | Admitting: Oncology

## 2022-01-08 ENCOUNTER — Inpatient Hospital Stay: Payer: Medicare HMO

## 2022-01-08 DIAGNOSIS — D751 Secondary polycythemia: Secondary | ICD-10-CM

## 2022-01-08 DIAGNOSIS — D72829 Elevated white blood cell count, unspecified: Secondary | ICD-10-CM

## 2022-01-14 ENCOUNTER — Telehealth: Payer: Self-pay | Admitting: Oncology

## 2022-01-14 NOTE — Telephone Encounter (Signed)
Pt called to reschedule her appt for 1-17. Call back at [phone]

## 2022-01-18 NOTE — Discharge Instructions (Signed)

## 2022-01-21 ENCOUNTER — Encounter: Payer: Self-pay | Admitting: Ophthalmology

## 2022-01-21 ENCOUNTER — Other Ambulatory Visit: Payer: Self-pay

## 2022-01-21 ENCOUNTER — Ambulatory Visit: Payer: Medicare HMO | Admitting: Anesthesiology

## 2022-01-21 ENCOUNTER — Ambulatory Visit
Admission: RE | Admit: 2022-01-21 | Discharge: 2022-01-21 | Disposition: A | Discharge status: Home / Self Care | Payer: Medicare HMO | Attending: Ophthalmology | Admitting: Ophthalmology

## 2022-01-21 ENCOUNTER — Encounter
Admission: RE | Disposition: A | Discharge status: Home / Self Care | Payer: Self-pay | Source: Home / Self Care | Attending: Ophthalmology

## 2022-01-21 DIAGNOSIS — N189 Chronic kidney disease, unspecified: Secondary | ICD-10-CM | POA: Insufficient documentation

## 2022-01-21 DIAGNOSIS — F419 Anxiety disorder, unspecified: Secondary | ICD-10-CM | POA: Diagnosis not present

## 2022-01-21 DIAGNOSIS — H2512 Age-related nuclear cataract, left eye: Secondary | ICD-10-CM | POA: Diagnosis present

## 2022-01-21 DIAGNOSIS — I129 Hypertensive chronic kidney disease with stage 1 through stage 4 chronic kidney disease, or unspecified chronic kidney disease: Secondary | ICD-10-CM | POA: Diagnosis not present

## 2022-01-21 HISTORY — PX: CATARACT EXTRACTION W/PHACO: SHX586

## 2022-01-21 SURGERY — PHACOEMULSIFICATION, CATARACT, WITH IOL INSERTION
Anesthesia: Monitor Anesthesia Care | Site: Eye | Laterality: Left

## 2022-01-21 MED ORDER — LACTATED RINGERS IV SOLN: INTRAVENOUS | Status: DC

## 2022-01-21 MED ORDER — TETRACAINE HCL 0.5 % OP SOLN
1.0000 [drp] | OPHTHALMIC | Status: DC | PRN
  Administered 2022-01-21 (×3): 1 [drp] via OPHTHALMIC

## 2022-01-21 MED ORDER — MOXIFLOXACIN HCL 0.5 % OP SOLN
OPHTHALMIC | Status: DC | PRN
  Administered 2022-01-21: 0.2 mL via OPHTHALMIC

## 2022-01-21 MED ORDER — SIGHTPATH DOSE#1 SODIUM HYALURONATE 10 MG/ML IO SOLUTION
PREFILLED_SYRINGE | INTRAOCULAR | Status: DC | PRN
  Administered 2022-01-21: 0.85 mL via INTRAOCULAR

## 2022-01-21 MED ORDER — SIGHTPATH DOSE#1 BSS IO SOLN
INTRAOCULAR | Status: DC | PRN
  Administered 2022-01-21: 97 mL via OPHTHALMIC

## 2022-01-21 MED ORDER — SIGHTPATH DOSE#1 SODIUM HYALURONATE 23 MG/ML IO SOLUTION
PREFILLED_SYRINGE | INTRAOCULAR | Status: DC | PRN
  Administered 2022-01-21: 0.6 mL via INTRAOCULAR

## 2022-01-21 MED ORDER — SIGHTPATH DOSE#1 BSS IO SOLN
INTRAOCULAR | Status: DC | PRN
  Administered 2022-01-21: 15 mL

## 2022-01-21 MED ORDER — MIDAZOLAM HCL 2 MG/2ML IJ SOLN
INTRAMUSCULAR | Status: DC | PRN
  Administered 2022-01-21: 1 mg via INTRAVENOUS

## 2022-01-21 MED ORDER — FENTANYL CITRATE (PF) 100 MCG/2ML IJ SOLN
INTRAMUSCULAR | Status: DC | PRN
  Administered 2022-01-21: 50 ug via INTRAVENOUS

## 2022-01-21 MED ORDER — LIDOCAINE HCL (PF) 2 % IJ SOLN
INTRAOCULAR | Status: DC | PRN
  Administered 2022-01-21: 1 mL via INTRAOCULAR

## 2022-01-21 MED ORDER — ARMC OPHTHALMIC DILATING DROPS
1.0000 "application " | OPHTHALMIC | Status: DC | PRN
  Administered 2022-01-21 (×3): 1 via OPHTHALMIC

## 2022-01-21 SURGICAL SUPPLY — 20 items
CANNULA ANT/CHMB 27G (MISCELLANEOUS) IMPLANT
CANNULA ANT/CHMB 27GA (MISCELLANEOUS) IMPLANT
CATARACT SUITE SIGHTPATH (MISCELLANEOUS) ×2 IMPLANT
DISSECTOR HYDRO NUCLEUS 50X22 (MISCELLANEOUS) ×2 IMPLANT
FEE CATARACT SUITE SIGHTPATH (MISCELLANEOUS) ×1 IMPLANT
GLOVE SURG GAMMEX PI TX LF 7.5 (GLOVE) ×2 IMPLANT
GLOVE SURG SYN 8.5  E (GLOVE) ×1
GLOVE SURG SYN 8.5 E (GLOVE) ×1 IMPLANT
GLOVE SURG SYN 8.5 PF PI (GLOVE) ×1 IMPLANT
LENS IOL TECNIS EYHANCE 24.0 (Intraocular Lens) ×1 IMPLANT
NDL FILTER BLUNT 18X1 1/2 (NEEDLE) ×1 IMPLANT
NEEDLE FILTER BLUNT 18X 1/2SAF (NEEDLE) ×1
NEEDLE FILTER BLUNT 18X1 1/2 (NEEDLE) ×1 IMPLANT
PACK VIT ANT 23G (MISCELLANEOUS) IMPLANT
RING MALYGIN (MISCELLANEOUS) IMPLANT
SUT ETHILON 10-0 CS-B-6CS-B-6 (SUTURE)
SUTURE EHLN 10-0 CS-B-6CS-B-6 (SUTURE) IMPLANT
SYR 3ML LL SCALE MARK (SYRINGE) ×2 IMPLANT
SYR 5ML LL (SYRINGE) ×2 IMPLANT
WATER STERILE IRR 250ML POUR (IV SOLUTION) ×2 IMPLANT

## 2022-01-21 NOTE — Anesthesia Preprocedure Evaluation (Addendum)
Anesthesia Evaluation  Patient identified by MRN, date of birth, ID band Patient awake    Reviewed: Allergy & Precautions, H&P , NPO status , Patient's Chart, lab work & pertinent test results  Airway Mallampati: II  TM Distance: >3 FB Neck ROM: full    Dental no notable dental hx.    Pulmonary    Pulmonary exam normal breath sounds clear to auscultation       Cardiovascular hypertension, + Valvular Problems/Murmurs AS  Rhythm:regular Rate:Normal + Systolic murmurs    Neuro/Psych PSYCHIATRIC DISORDERS Anxiety    GI/Hepatic   Endo/Other    Renal/GU Renal disease     Musculoskeletal   Abdominal   Peds  Hematology   Anesthesia Other Findings   Reproductive/Obstetrics                            Anesthesia Physical Anesthesia Plan  ASA: 3  Anesthesia Plan: MAC   Post-op Pain Management: Minimal or no pain anticipated   Induction:   PONV Risk Score and Plan: 2 and Treatment may vary due to age or medical condition, TIVA and Midazolam  Airway Management Planned:   Additional Equipment:   Intra-op Plan:   Post-operative Plan:   Informed Consent: I have reviewed the patients History and Physical, chart, labs and discussed the procedure including the risks, benefits and alternatives for the proposed anesthesia with the patient or authorized representative who has indicated his/her understanding and acceptance.     Dental Advisory Given  Plan Discussed with: CRNA  Anesthesia Plan Comments:         Anesthesia Quick Evaluation

## 2022-01-21 NOTE — Anesthesia Procedure Notes (Signed)
Procedure Name: MAC Date/Time: 01/21/2022 9:12 AM Performed by: Cameron Ali, CRNA Pre-anesthesia Checklist: Patient identified, Emergency Drugs available, Suction available, Timeout performed and Patient being monitored Patient Re-evaluated:Patient Re-evaluated prior to induction Oxygen Delivery Method: Nasal cannula Placement Confirmation: positive ETCO2

## 2022-01-21 NOTE — Transfer of Care (Signed)
Immediate Anesthesia Transfer of Care Note  Patient: Linda Bradley  Procedure(s) Performed: CATARACT EXTRACTION PHACO AND INTRAOCULAR LENS PLACEMENT (IOC) LEFT (Left: Eye)  Patient Location: PACU  Anesthesia Type: MAC  Level of Consciousness: awake, alert  and patient cooperative  Airway and Oxygen Therapy: Patient Spontanous Breathing and Patient connected to supplemental oxygen  Post-op Assessment: Post-op Vital signs reviewed, Patient's Cardiovascular Status Stable, Respiratory Function Stable, Patent Airway and No signs of Nausea or vomiting  Post-op Vital Signs: Reviewed and stable  Complications: No notable events documented.

## 2022-01-21 NOTE — Anesthesia Postprocedure Evaluation (Signed)
Anesthesia Post Note  Patient: Linda Bradley  Procedure(s) Performed: CATARACT EXTRACTION PHACO AND INTRAOCULAR LENS PLACEMENT (IOC) LEFT (Left: Eye)     Patient location during evaluation: PACU Anesthesia Type: MAC Level of consciousness: awake and alert and oriented Pain management: satisfactory to patient Vital Signs Assessment: post-procedure vital signs reviewed and stable Respiratory status: spontaneous breathing, nonlabored ventilation and respiratory function stable Cardiovascular status: blood pressure returned to baseline and stable Postop Assessment: Adequate PO intake and No signs of nausea or vomiting Anesthetic complications: no   No notable events documented.  Raliegh Ip

## 2022-01-21 NOTE — Op Note (Signed)
OPERATIVE NOTE  RUTH KOVICH [id number] 01/21/2022   PREOPERATIVE DIAGNOSIS:  Nuclear sclerotic cataract left eye.  H25.12   POSTOPERATIVE DIAGNOSIS:    Nuclear sclerotic cataract left eye.     PROCEDURE:  Phacoemusification with posterior chamber intraocular lens placement of the left eye   LENS:   Implant Name Type Inv. Item Serial No. Manufacturer Lot No. LRB No. Used Action  LENS IOL TECNIS EYHANCE 24.0 - [id number] Intraocular Lens LENS IOL TECNIS EYHANCE 24.0 [id number] [employer]  Left 1 Implanted      Procedure(s) with comments: CATARACT EXTRACTION PHACO AND INTRAOCULAR LENS PLACEMENT (IOC) LEFT (Left) - 12.93 1:12.8  DIB00 +24.0   ULTRASOUND TIME: 1 minutes 12 seconds.  CDE 12.93   SURGEON:  Benay Pillow, MD, MPH   ANESTHESIA:  Topical with tetracaine drops augmented with 1% preservative-free intracameral lidocaine.  ESTIMATED BLOOD LOSS: <1 mL   COMPLICATIONS:  None.   DESCRIPTION OF PROCEDURE:  The patient was identified in the holding room and transported to the operating room and placed in the supine position under the operating microscope.  The left eye was identified as the operative eye and it was prepped and draped in the usual sterile ophthalmic fashion.   A 1.0 millimeter clear-corneal paracentesis was made at the 5:00 position. 0.5 ml of preservative-free 1% lidocaine with epinephrine was injected into the anterior chamber.  The anterior chamber was filled with Healon 5 viscoelastic.  A 2.4 millimeter keratome was used to make a near-clear corneal incision at the 2:00 position.  A curvilinear capsulorrhexis was made with a cystotome and capsulorrhexis forceps.  Balanced salt solution was used to hydrodissect and hydrodelineate the nucleus.   Phacoemulsification was then used in stop and chop fashion to remove the lens nucleus and epinucleus.  The remaining cortex was then removed using the irrigation and aspiration handpiece. Healon was then placed into the  capsular bag to distend it for lens placement.  A lens was then injected into the capsular bag.  The remaining viscoelastic was aspirated.   Wounds were hydrated with balanced salt solution.  The anterior chamber was inflated to a physiologic pressure with balanced salt solution.  Intracameral vigamox 0.1 mL undiltued was injected into the eye and a drop placed onto the ocular surface.  No wound leaks were noted.  The patient was taken to the recovery room in stable condition without complications of anesthesia or surgery  Benay Pillow 01/21/2022, 9:32 AM

## 2022-01-21 NOTE — H&P (Signed)
[facility]   Primary Care Physician:  Lynnell Jude, MD Ophthalmologist: Dr. Benay Pillow  Pre-Procedure History & Physical: HPI:  Linda Bradley is a 81 y.o. female here for cataract surgery.   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   Heart murmur    Hypercholesterolemia    Hypertension    Lymphadenitis    Renal disorder    Skin cancer    Spontaneous ecchymoses    Vitamin D deficiency     Past Surgical History:  Procedure Laterality Date   CARPAL TUNNEL RELEASE     [facility]   ESOPHAGOGASTRODUODENOSCOPY (EGD) WITH PROPOFOL N/A 04/14/2020   Procedure: ESOPHAGOGASTRODUODENOSCOPY (EGD) WITH PROPOFOL;  Surgeon: Lucilla Lame, MD;  Location: [facility] ENDOSCOPY;  Service: Endoscopy;  Laterality: N/A;   HIP ARTHROPLASTY Left 11/18/2020   Procedure: ARTHROPLASTY BIPOLAR HIP (HEMIARTHROPLASTY);  Surgeon: Corky Mull, MD;  Location: [facility] ORS;  Service: Orthopedics;  Laterality: Left;   TONSILLECTOMY     TRANSTHORACIC ECHOCARDIOGRAM  05/01/2015    EF 60-65%. Abnormal relaxation. Mild aortic stenosis (peak gradient 19 mmHg).    Prior to Admission medications   Medication Sig Start Date End Date Taking? Authorizing Provider  amLODipine (NORVASC) 5 MG tablet Take 5 mg by mouth daily. 08/29/20  Yes [provider]  aspirin 81 MG chewable tablet Chew by mouth. 09/04/21  Yes [provider]  atenolol (TENORMIN) 25 MG tablet Take 0.5 tablets (12.5 mg total) by mouth daily. 11/24/20  Yes Jennye Boroughs, MD  clonazePAM (KLONOPIN) 0.5 MG tablet Take 1 tablet (0.5 mg total) by mouth 2 (two) times daily as needed for anxiety. 11/24/20  Yes Jennye Boroughs, MD  cloNIDine (CATAPRES) 0.2 MG tablet Take 0.2 mg by mouth 3 (three) times daily. 02/08/21  Yes [provider]  Ferrous Sulfate (SLOW FE) 142 (45 Fe) MG TBCR Take 45 mg of iron by mouth daily.   Yes [provider]  furosemide  (LASIX) 20 MG tablet Take by mouth. 12/04/21  Yes [provider]  losartan (COZAAR) 100 MG tablet Take 100 mg by mouth daily. 12/09/20  Yes [provider]  predniSONE (DELTASONE) 10 MG tablet Take 1 tablet (10 mg total) by mouth daily. 07/17/20  Yes Cuthriell, Charline Bills, PA-C  vitamin B-12 1000 MCG tablet Take 1 tablet (1,000 mcg total) by mouth daily. 03/28/20  Yes Fritzi Mandes, MD    Allergies as of 11/30/2021 - Review Complete 03/02/2021  Allergen Reaction Noted   Coreg [carvedilol] Nausea Only and Other (See Comments) 03/25/2020   Statins Other (See Comments) 04/12/2015   Chlorthalidone Diarrhea 04/10/2016    Family History  Problem Relation Age of Onset   Heart disease Father    Heart failure Father     Social History   Socioeconomic History   Marital status: Widowed    Spouse name: Not on file   Number of children: Not on file   Years of education: Not on file   Highest education level: Not on file  Occupational History   Not on file  Tobacco Use   Smoking status: Never   Smokeless tobacco: Never  Vaping Use   Vaping Use: Never used  Substance and Sexual Activity   Alcohol use: No   Drug use: No   Sexual activity: Not on file  Other Topics Concern   Not on file  Social History Narrative   Not on  file   Social Determinants of Health   Financial Resource Strain: Not on file  Food Insecurity: Not on file  Transportation Needs: Not on file  Physical Activity: Not on file  Stress: Not on file  Social Connections: Not on file  Intimate Partner Violence: Not on file    Review of Systems: See HPI, otherwise negative ROS  Physical Exam: BP (!) 184/84    Pulse 60    Temp 97.7 F (36.5 C) (Temporal)    Resp 20    Ht 4\' 11"  (1.499 m)    Wt 68 kg    SpO2 97%    BMI 30.30 kg/m  General:   Alert, cooperative in NAD Head:  Normocephalic and atraumatic. Respiratory:  Normal work of breathing. Cardiovascular:  RRR  Impression/Plan: Linda Bradley is here for cataract surgery.  Risks, benefits, limitations, and alternatives regarding cataract surgery have been reviewed with the patient.  Questions have been answered.  All parties agreeable.   Benay Pillow, MD  01/21/2022, 9:05 AM

## 2022-01-22 ENCOUNTER — Encounter: Payer: Self-pay | Admitting: Ophthalmology

## 2022-01-31 NOTE — Discharge Instructions (Signed)

## 2022-02-04 ENCOUNTER — Ambulatory Visit: Payer: Medicare HMO | Admitting: Anesthesiology

## 2022-02-04 ENCOUNTER — Ambulatory Visit
Admission: RE | Admit: 2022-02-04 | Discharge: 2022-02-04 | Disposition: A | Discharge status: Home / Self Care | Payer: Medicare HMO | Attending: Ophthalmology | Admitting: Ophthalmology

## 2022-02-04 ENCOUNTER — Encounter: Payer: Self-pay | Admitting: Ophthalmology

## 2022-02-04 ENCOUNTER — Other Ambulatory Visit: Payer: Self-pay

## 2022-02-04 ENCOUNTER — Encounter
Admission: RE | Disposition: A | Discharge status: Home / Self Care | Payer: Self-pay | Source: Home / Self Care | Attending: Ophthalmology

## 2022-02-04 DIAGNOSIS — I10 Essential (primary) hypertension: Secondary | ICD-10-CM | POA: Insufficient documentation

## 2022-02-04 DIAGNOSIS — I350 Nonrheumatic aortic (valve) stenosis: Secondary | ICD-10-CM | POA: Diagnosis not present

## 2022-02-04 DIAGNOSIS — F419 Anxiety disorder, unspecified: Secondary | ICD-10-CM | POA: Diagnosis not present

## 2022-02-04 DIAGNOSIS — N289 Disorder of kidney and ureter, unspecified: Secondary | ICD-10-CM | POA: Insufficient documentation

## 2022-02-04 DIAGNOSIS — H2511 Age-related nuclear cataract, right eye: Secondary | ICD-10-CM | POA: Diagnosis present

## 2022-02-04 HISTORY — PX: CATARACT EXTRACTION W/PHACO: SHX586

## 2022-02-04 SURGERY — PHACOEMULSIFICATION, CATARACT, WITH IOL INSERTION
Anesthesia: Monitor Anesthesia Care | Site: Eye | Laterality: Right

## 2022-02-04 MED ORDER — LIDOCAINE HCL (PF) 2 % IJ SOLN
INTRAOCULAR | Status: DC | PRN
  Administered 2022-02-04: 4 mL via INTRAOCULAR

## 2022-02-04 MED ORDER — SIGHTPATH DOSE#1 BSS IO SOLN
INTRAOCULAR | Status: DC | PRN
  Administered 2022-02-04: 15 mL via INTRAOCULAR

## 2022-02-04 MED ORDER — MOXIFLOXACIN HCL 0.5 % OP SOLN
OPHTHALMIC | Status: DC | PRN
  Administered 2022-02-04: 0.2 mL via OPHTHALMIC

## 2022-02-04 MED ORDER — ACETAMINOPHEN 325 MG PO TABS: 325.0000 mg | ORAL_TABLET | ORAL | Status: DC | PRN

## 2022-02-04 MED ORDER — SIGHTPATH DOSE#1 BSS IO SOLN
INTRAOCULAR | Status: DC | PRN
  Administered 2022-02-04: 70 mL via OPHTHALMIC

## 2022-02-04 MED ORDER — TETRACAINE HCL 0.5 % OP SOLN
1.0000 [drp] | OPHTHALMIC | Status: DC | PRN
  Administered 2022-02-04 (×3): 1 [drp] via OPHTHALMIC

## 2022-02-04 MED ORDER — ACETAMINOPHEN 160 MG/5ML PO SOLN: 325.0000 mg | ORAL | Status: DC | PRN

## 2022-02-04 MED ORDER — FENTANYL CITRATE (PF) 100 MCG/2ML IJ SOLN
INTRAMUSCULAR | Status: DC | PRN
  Administered 2022-02-04: 50 ug via INTRAVENOUS

## 2022-02-04 MED ORDER — SIGHTPATH DOSE#1 SODIUM HYALURONATE 23 MG/ML IO SOLUTION
PREFILLED_SYRINGE | INTRAOCULAR | Status: DC | PRN
  Administered 2022-02-04: 0.6 mL via INTRAOCULAR

## 2022-02-04 MED ORDER — SIGHTPATH DOSE#1 SODIUM HYALURONATE 10 MG/ML IO SOLUTION
PREFILLED_SYRINGE | INTRAOCULAR | Status: DC | PRN
  Administered 2022-02-04: 0.85 mL via INTRAOCULAR

## 2022-02-04 MED ORDER — ONDANSETRON HCL 4 MG/2ML IJ SOLN: 4.0000 mg | Freq: Once | INTRAMUSCULAR | Status: DC | PRN

## 2022-02-04 MED ORDER — ARMC OPHTHALMIC DILATING DROPS
1.0000 "application " | OPHTHALMIC | Status: DC | PRN
  Administered 2022-02-04 (×3): 1 via OPHTHALMIC

## 2022-02-04 MED ORDER — MIDAZOLAM HCL 2 MG/2ML IJ SOLN
INTRAMUSCULAR | Status: DC | PRN
  Administered 2022-02-04: 1 mg via INTRAVENOUS

## 2022-02-04 SURGICAL SUPPLY — 16 items
CANNULA ANT/CHMB 27G (MISCELLANEOUS) IMPLANT
CANNULA ANT/CHMB 27GA (MISCELLANEOUS) IMPLANT
CATARACT SUITE SIGHTPATH (MISCELLANEOUS) ×2 IMPLANT
DISSECTOR HYDRO NUCLEUS 50X22 (MISCELLANEOUS) ×2 IMPLANT
FEE CATARACT SUITE SIGHTPATH (MISCELLANEOUS) ×1 IMPLANT
GLOVE SURG GAMMEX PI TX LF 7.5 (GLOVE) ×2 IMPLANT
GLOVE SURG SYN 8.5  E (GLOVE) ×1
GLOVE SURG SYN 8.5 E (GLOVE) ×1 IMPLANT
GLOVE SURG SYN 8.5 PF PI (GLOVE) ×1 IMPLANT
LENS IOL TECNIS EYHANCE 23.0 (Intraocular Lens) ×1 IMPLANT
NDL FILTER BLUNT 18X1 1/2 (NEEDLE) ×1 IMPLANT
NEEDLE FILTER BLUNT 18X 1/2SAF (NEEDLE) ×1
NEEDLE FILTER BLUNT 18X1 1/2 (NEEDLE) ×1 IMPLANT
SYR 3ML LL SCALE MARK (SYRINGE) ×2 IMPLANT
SYR 5ML LL (SYRINGE) ×2 IMPLANT
WATER STERILE IRR 250ML POUR (IV SOLUTION) ×2 IMPLANT

## 2022-02-04 NOTE — Anesthesia Procedure Notes (Signed)
Procedure Name: MAC Date/Time: 02/04/2022 9:09 AM Performed by: Dionne Bucy, CRNA Pre-anesthesia Checklist: Patient identified, Emergency Drugs available, Suction available, Patient being monitored and Timeout performed Patient Re-evaluated:Patient Re-evaluated prior to induction Oxygen Delivery Method: Nasal cannula Placement Confirmation: positive ETCO2

## 2022-02-04 NOTE — Anesthesia Postprocedure Evaluation (Signed)
Anesthesia Post Note  Patient: Linda Bradley  Procedure(s) Performed: CATARACT EXTRACTION PHACO AND INTRAOCULAR LENS PLACEMENT (IOC) RIGHT 8.34 00:50.9 (Right: Eye)     Patient location during evaluation: PACU Anesthesia Type: MAC Level of consciousness: awake Pain management: pain level controlled Vital Signs Assessment: post-procedure vital signs reviewed and stable Respiratory status: respiratory function stable Cardiovascular status: stable Postop Assessment: no signs of nausea or vomiting Anesthetic complications: no   No notable events documented.  Veda Canning

## 2022-02-04 NOTE — H&P (Signed)
[facility]   Primary Care Physician:  Lynnell Jude, MD Ophthalmologist: Dr. Benay Pillow  Pre-Procedure History & Physical: HPI:  Linda Bradley is a 81 y.o. female here for cataract surgery.   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   Heart murmur    Hypercholesterolemia    Hypertension    Lymphadenitis    Renal disorder    Skin cancer    Spontaneous ecchymoses    Vitamin D deficiency     Past Surgical History:  Procedure Laterality Date   CARPAL TUNNEL RELEASE     [facility]   CATARACT EXTRACTION W/PHACO Left 01/21/2022   Procedure: CATARACT EXTRACTION PHACO AND INTRAOCULAR LENS PLACEMENT ([place]) LEFT;  Surgeon: Eulogio Bear, MD;  Location: [place];  Service: Ophthalmology;  Laterality: Left;  12.93 1:12.8   ESOPHAGOGASTRODUODENOSCOPY (EGD) WITH PROPOFOL N/A 04/14/2020   Procedure: ESOPHAGOGASTRODUODENOSCOPY (EGD) WITH PROPOFOL;  Surgeon: Lucilla Lame, MD;  Location: [facility];  Service: Endoscopy;  Laterality: N/A;   HIP ARTHROPLASTY Left 11/18/2020   Procedure: ARTHROPLASTY BIPOLAR HIP (HEMIARTHROPLASTY);  Surgeon: Corky Mull, MD;  Location: [facility] ORS;  Service: Orthopedics;  Laterality: Left;   TONSILLECTOMY     TRANSTHORACIC ECHOCARDIOGRAM  05/01/2015    EF 60-65%. Abnormal relaxation. Mild aortic stenosis (peak gradient 19 mmHg).    Prior to Admission medications   Medication Sig Start Date End Date Taking? Authorizing Provider  amLODipine (NORVASC) 5 MG tablet Take 5 mg by mouth daily. 08/29/20  Yes [provider]  aspirin 81 MG chewable tablet Chew by mouth. 09/04/21  Yes [provider]  atenolol (TENORMIN) 25 MG tablet Take 0.5 tablets (12.5 mg total) by mouth daily. 11/24/20  Yes Jennye Boroughs, MD  clonazePAM (KLONOPIN) 0.5 MG tablet Take 1 tablet (0.5 mg total) by mouth 2 (two) times daily as needed for anxiety. 11/24/20  Yes Jennye Boroughs, MD  cloNIDine (CATAPRES) 0.2 MG tablet Take 0.2 mg by mouth 3 (three) times daily. 02/08/21  Yes [provider]  Ferrous Sulfate (SLOW FE) 142 (45 Fe) MG TBCR Take 45 mg of iron by mouth daily.   Yes [provider]  furosemide (LASIX) 20 MG tablet Take by mouth. 12/04/21  Yes [provider]  losartan (COZAAR) 100 MG tablet Take 100 mg by mouth daily. 12/09/20  Yes [provider]  predniSONE (DELTASONE) 10 MG tablet Take 1 tablet (10 mg total) by mouth daily. 07/17/20  Yes Cuthriell, Charline Bills, PA-C  vitamin B-12 1000 MCG tablet Take 1 tablet (1,000 mcg total) by mouth daily. 03/28/20  Yes Fritzi Mandes, MD    Allergies as of 11/30/2021 - Review Complete 03/02/2021  Allergen Reaction Noted   Coreg [carvedilol] Nausea Only and Other (See Comments) 03/25/2020   Statins Other (See Comments) 04/12/2015   Chlorthalidone Diarrhea 04/10/2016    Family History  Problem Relation Age of Onset   Heart disease Father    Heart failure Father     Social History   Socioeconomic History   Marital status: Widowed    Spouse name: Not on file   Number of children: Not on file   Years of education: Not on file   Highest education level: Not on file  Occupational History   Not on file  Tobacco Use   Smoking status: Never   Smokeless tobacco: Never  Vaping Use   Vaping Use: Never used  Substance and Sexual Activity   Alcohol use: No   Drug use: No   Sexual activity: Not on file  Other Topics Concern   Not on file  Social History Narrative   Not on file   Social Determinants of Health   Financial Resource Strain: Not on file  Food Insecurity: Not on file  Transportation Needs: Not on file  Physical Activity: Not on file  Stress: Not on file  Social Connections: Not on file  Intimate Partner Violence: Not on file    Review of Systems: See HPI, otherwise negative ROS  Physical Exam: BP (!) 184/74    Pulse 68    Temp (!) 97.5 F (36.4 C)  (Temporal)    Resp 16    Ht 4\' 11"  (1.499 m)    Wt 68 kg    SpO2 96%    BMI 30.28 kg/m  General:   Alert, cooperative in NAD Head:  Normocephalic and atraumatic. Respiratory:  Normal work of breathing. Cardiovascular:  RRR  Impression/Plan: Linda Bradley is here for cataract surgery.  Risks, benefits, limitations, and alternatives regarding cataract surgery have been reviewed with the patient.  Questions have been answered.  All parties agreeable.   Benay Pillow, MD  02/04/2022, 9:01 AM

## 2022-02-04 NOTE — Anesthesia Preprocedure Evaluation (Signed)
Anesthesia Evaluation  Patient identified by MRN, date of birth, ID band Patient awake    Reviewed: Allergy & Precautions, H&P , NPO status , Patient's Chart, lab work & pertinent test results  Airway Mallampati: II  TM Distance: >3 FB Neck ROM: full    Dental no notable dental hx.    Pulmonary    Pulmonary exam normal breath sounds clear to auscultation       Cardiovascular hypertension, + Valvular Problems/Murmurs AS  Rhythm:regular Rate:Normal + Systolic murmurs    Neuro/Psych PSYCHIATRIC DISORDERS Anxiety    GI/Hepatic   Endo/Other    Renal/GU Renal disease     Musculoskeletal   Abdominal   Peds  Hematology   Anesthesia Other Findings   Reproductive/Obstetrics                             Anesthesia Physical  Anesthesia Plan  ASA: 3  Anesthesia Plan: MAC   Post-op Pain Management: Minimal or no pain anticipated   Induction:   PONV Risk Score and Plan: 2 and Treatment may vary due to age or medical condition, TIVA and Midazolam  Airway Management Planned: Nasal Cannula  Additional Equipment:   Intra-op Plan:   Post-operative Plan:   Informed Consent: I have reviewed the patients History and Physical, chart, labs and discussed the procedure including the risks, benefits and alternatives for the proposed anesthesia with the patient or authorized representative who has indicated his/her understanding and acceptance.     Dental Advisory Given  Plan Discussed with: CRNA  Anesthesia Plan Comments:         Anesthesia Quick Evaluation

## 2022-02-04 NOTE — Transfer of Care (Signed)
Immediate Anesthesia Transfer of Care Note  Patient: Linda Bradley  Procedure(s) Performed: CATARACT EXTRACTION PHACO AND INTRAOCULAR LENS PLACEMENT (IOC) RIGHT 8.34 00:50.9 (Right: Eye)  Patient Location: PACU  Anesthesia Type: MAC  Level of Consciousness: awake, alert  and patient cooperative  Airway and Oxygen Therapy: Patient Spontanous Breathing and Patient connected to supplemental oxygen  Post-op Assessment: Post-op Vital signs reviewed, Patient's Cardiovascular Status Stable, Respiratory Function Stable, Patent Airway and No signs of Nausea or vomiting  Post-op Vital Signs: Reviewed and stable  Complications: No notable events documented.

## 2022-02-04 NOTE — Op Note (Signed)
OPERATIVE NOTE  Linda Bradley [id number] 02/04/2022   PREOPERATIVE DIAGNOSIS:  Nuclear sclerotic cataract right eye.  H25.11   POSTOPERATIVE DIAGNOSIS:    Nuclear sclerotic cataract right eye.     PROCEDURE:  Phacoemusification with posterior chamber intraocular lens placement of the right eye   LENS:   Implant Name Type Inv. Item Serial No. Manufacturer Lot No. LRB No. Used Action  LENS IOL TECNIS EYHANCE 23.0 - [id number] Intraocular Lens LENS IOL TECNIS EYHANCE 23.0 [id number] [employer]  Right 1 Implanted       Procedure(s): CATARACT EXTRACTION PHACO AND INTRAOCULAR LENS PLACEMENT (IOC) RIGHT 8.34 00:50.9 (Right)   SURGEON:  Benay Pillow, MD, MPH  ANESTHESIOLOGIST: Anesthesiologist: Veda Canning, MD CRNA: Dionne Bucy, CRNA   ANESTHESIA:  Topical with tetracaine drops augmented with 1% preservative-free intracameral lidocaine.  ESTIMATED BLOOD LOSS: less than 1 mL.   COMPLICATIONS:  None.   DESCRIPTION OF PROCEDURE:  The patient was identified in the holding room and transported to the operating room and placed in the supine position under the operating microscope.  The right eye was identified as the operative eye and it was prepped and draped in the usual sterile ophthalmic fashion.   A 1.0 millimeter clear-corneal paracentesis was made at the 10:30 position. 0.5 ml of preservative-free 1% lidocaine with epinephrine was injected into the anterior chamber.  The anterior chamber was filled with Healon 5 viscoelastic.  A 2.4 millimeter keratome was used to make a near-clear corneal incision at the 8:00 position.  A curvilinear capsulorrhexis was made with a cystotome and capsulorrhexis forceps.  Balanced salt solution was used to hydrodissect and hydrodelineate the nucleus.   Phacoemulsification was then used in stop and chop fashion to remove the lens nucleus and epinucleus.  The remaining cortex was then removed using the irrigation and aspiration handpiece. Healon was  then placed into the capsular bag to distend it for lens placement.  A lens was then injected into the capsular bag.  The remaining viscoelastic was aspirated.   Wounds were hydrated with balanced salt solution.  The anterior chamber was inflated to a physiologic pressure with balanced salt solution.   Intracameral vigamox 0.1 mL undiluted was injected into the eye and a drop placed onto the ocular surface.  No wound leaks were noted.  The patient was taken to the recovery room in stable condition without complications of anesthesia or surgery  Benay Pillow 02/04/2022, 9:29 AM

## 2022-02-05 ENCOUNTER — Encounter: Payer: Self-pay | Admitting: Ophthalmology

## 2022-03-01 NOTE — Progress Notes (Unsigned)
[place]  Telephone:([phone] Fax:(336) [phone]  ID: Linda Bradley OB: 04/19/1941  MR#: [id number]  CNO#:[id number]  Patient Care Team: Lynnell Jude, MD as PCP - General (Family Medicine)  CHIEF COMPLAINT: Polycythemia  INTERVAL HISTORY: Patient is a 81 year old female who was previously seen in clinic for Rituxan infusion needed for glomerulonephritis.  She is now referred for increasing hemoglobin.  She currently feels well and is asymptomatic.  She has no neurologic complaints.  She denies any recent fevers or illnesses.  She has a good appetite and denies weight loss.  She has no chest pain, shortness of breath, cough, or hemoptysis.  She denies any nausea, vomiting, constipation, or diarrhea.  She has no urinary complaints.  Patient feels at her baseline offers no specific complaints today.  REVIEW OF SYSTEMS:   Review of Systems  Constitutional: Negative.  Negative for fever, malaise/fatigue and weight loss.  Respiratory: Negative.  Negative for cough, hemoptysis and shortness of breath.   Cardiovascular: Negative.  Negative for chest pain and leg swelling.  Gastrointestinal: Negative.  Negative for abdominal pain.  Genitourinary: Negative.  Negative for dysuria.  Musculoskeletal: Negative.  Negative for back pain.  Skin: Negative.  Negative for rash.  Neurological: Negative.  Negative for dizziness, seizures, weakness and headaches.  Psychiatric/Behavioral: Negative.  The patient is not nervous/anxious.    As per HPI. Otherwise, a complete review of systems is negative.  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   Heart murmur    Hypercholesterolemia    Hypertension    Lymphadenitis    Renal disorder    Skin cancer    Spontaneous ecchymoses    Vitamin D deficiency     PAST SURGICAL HISTORY: Past Surgical History:  Procedure  Laterality Date   CARPAL TUNNEL RELEASE     [facility]   CATARACT EXTRACTION W/PHACO Left 01/21/2022   Procedure: CATARACT EXTRACTION PHACO AND INTRAOCULAR LENS PLACEMENT ([place]) LEFT;  Surgeon: Eulogio Bear, MD;  Location: [place];  Service: Ophthalmology;  Laterality: Left;  12.93 1:12.8   CATARACT EXTRACTION W/PHACO Right 02/04/2022   Procedure: CATARACT EXTRACTION PHACO AND INTRAOCULAR LENS PLACEMENT (I[facility]) RIGHT 8.34 00:50.9;  Surgeon: Eulogio Bear, MD;  Location: [place];  Service: Ophthalmology;  Laterality: Right;   ESOPHAGOGASTRODUODENOSCOPY (EGD) WITH PROPOFOL N/A 04/14/2020   Procedure: ESOPHAGOGASTRODUODENOSCOPY (EGD) WITH PROPOFOL;  Surgeon: Lucilla Lame, MD;  Location: [facility] ENDOSCOPY;  Service: Endoscopy;  Laterality: N/A;   HIP ARTHROPLASTY Left 11/18/2020   Procedure: ARTHROPLASTY BIPOLAR HIP (HEMIARTHROPLASTY);  Surgeon: Corky Mull, MD;  Location: [facility] OR[facility];  Service: Orthopedics;  Laterality: Left;   TONSILLECTOMY     TRANSTHORACIC ECHOCARDIOGRAM  05/01/2015    EF 60-65%. Abnormal relaxation. Mild aortic stenosis (peak gradient 19 mmHg).    FAMILY HISTORY: Family History  Problem Relation Age of Onset   Heart disease Father    Heart failure Father     ADVANCED DIRECTIVES (Y/N):  N  HEALTH MAINTENANCE: Social History   Tobacco Use   Smoking status: Never   Smokeless tobacco: Never  Vaping Use   Vaping Use: Never used  Substance Use Topics   Alcohol use: No   Drug use: No     Colonoscopy:  PAP:  Bone density:  Lipid panel:  Allergies  Allergen Reactions   Influenza Vaccines Anaphylaxis   Coreg [Carvedilol] Nausea Only and Other (  See Comments)    Syncope   Statins Other (See Comments)    Other Reaction: CNS DISORDER   Chlorthalidone Diarrhea    Current Outpatient Medications  Medication Sig Dispense Refill   amLODipine (NORVASC) 5 MG tablet Take 5 mg by mouth daily.     aspirin 81 MG chewable tablet Chew by mouth.      atenolol (TENORMIN) 25 MG tablet Take 0.5 tablets (12.5 mg total) by mouth daily.     clonazePAM (KLONOPIN) 0.5 MG tablet Take 1 tablet (0.5 mg total) by mouth 2 (two) times daily as needed for anxiety. 10 tablet 0   cloNIDine (CATAPRES) 0.2 MG tablet Take 0.2 mg by mouth 3 (three) times daily.     Ferrous Sulfate (SLOW FE) 142 (45 Fe) MG TBCR Take 45 mg of iron by mouth daily.     furosemide (LASIX) 20 MG tablet Take by mouth.     losartan (COZAAR) 100 MG tablet Take 100 mg by mouth daily.     predniSONE (DELTASONE) 10 MG tablet Take 1 tablet (10 mg total) by mouth daily. 42 tablet 0   vitamin B-12 1000 MCG tablet Take 1 tablet (1,000 mcg total) by mouth daily. 30 tablet 0   No current facility-administered medications for this visit.    OBJECTIVE: There were no vitals filed for this visit.    There is no height or weight on file to calculate BMI.    ECOG FS:0 - Asymptomatic  General: Well-developed, well-nourished, no acute distress.  Sitting in a wheelchair. Eyes: Pink conjunctiva, anicteric sclera. HEENT: Normocephalic, moist mucous membranes. Lungs: No audible wheezing or coughing. Heart: Regular rate and rhythm. Abdomen: Soft, nontender, no obvious distention. Musculoskeletal: No edema, cyanosis, or clubbing. Neuro: Alert, answering all questions appropriately. Cranial nerves grossly intact. Skin: No rashes or petechiae noted. Psych: Normal affect. Lymphatics: No cervical, calvicular, axillary or inguinal LAD.   LAB RESULTS:  Lab Results  Component Value Date   NA 130 (L) 03/02/2021   K 5.1 03/02/2021   CL 92 (L) 03/02/2021   CO2 25 03/02/2021   GLUCOSE 158 (H) 03/02/2021   BUN 27 (H) 03/02/2021   CREATININE 1.45 (H) 03/02/2021   CALCIUM 9.6 03/02/2021   PROT 5.8 (L) 03/15/2016   ALBUMIN 3.0 (L) 04/14/2020   AST 38 03/15/2016   ALT 30 03/15/2016   ALKPHOS 59 03/15/2016   BILITOT 1.3 (H) 03/15/2016   GFRNONAA 37 (L) 03/02/2021   GFRAA 43 (L) 08/15/2020    Lab  Results  Component Value Date   WBC 17.8 (H) 12/05/2021   NEUTROABS 19.5 (H) 03/02/2021   HGB 15.4 (H) 12/05/2021   HCT 45.5 12/05/2021   MCV 88.2 12/05/2021   PLT 285 12/05/2021     STUDIES: No results found.  ASSESSMENT: Polycythemia.  PLAN:    1.  Polycythemia: Patient's hemoglobin remains mildly elevated at 15.4 today.  Iron stores are within normal limits.  Erythropoietin, carbon oxide, hemochromatosis mutation, and JAK2 mutation are all pending at time of dictation.  No intervention is needed at this time.  Patient does not require phlebotomy.  Return to clinic in 1 month with repeat laboratory work, further evaluation, and discussion of her laboratory results. 2.  Leukocytosis: Patient appears to have a chronically elevated white blood cell count.  We will get BCR-able and peripheral blood flow cytometry with next lab draw. 3.  Glomerulonephritis: Continue follow-up and treatment per nephrology.  I spent a total of 45 minutes reviewing chart data, face-to-face  evaluation with the patient, counseling and coordination of care as detailed above.   Patient expressed understanding and was in agreement with this plan. She also understands that She can call clinic at any time with any questions, concerns, or complaints.     Lloyd Huger, MD   03/01/2022 8:39 AM

## 2022-03-05 ENCOUNTER — Inpatient Hospital Stay: Payer: Medicare HMO

## 2022-03-05 ENCOUNTER — Other Ambulatory Visit: Payer: Self-pay

## 2022-03-05 ENCOUNTER — Inpatient Hospital Stay: Payer: Medicare HMO | Attending: Oncology | Admitting: Oncology

## 2022-03-05 VITALS — BP 156/74 | HR 62 | Temp 97.9°F | Resp 16 | Ht 59.0 in | Wt 151.6 lb

## 2022-03-05 DIAGNOSIS — D751 Secondary polycythemia: Secondary | ICD-10-CM | POA: Insufficient documentation

## 2022-03-05 DIAGNOSIS — Z79899 Other long term (current) drug therapy: Secondary | ICD-10-CM | POA: Diagnosis not present

## 2022-03-05 DIAGNOSIS — D72829 Elevated white blood cell count, unspecified: Secondary | ICD-10-CM | POA: Insufficient documentation

## 2022-03-05 DIAGNOSIS — N059 Unspecified nephritic syndrome with unspecified morphologic changes: Secondary | ICD-10-CM | POA: Insufficient documentation

## 2022-03-05 DIAGNOSIS — Z148 Genetic carrier of other disease: Secondary | ICD-10-CM | POA: Diagnosis not present

## 2022-03-05 LAB — CBC WITH DIFFERENTIAL/PLATELET
Abs Immature Granulocytes: 0.15 10*3/uL — ABNORMAL HIGH (ref 0.00–0.07)
Basophils Absolute: 0.1 10*3/uL (ref 0.0–0.1)
Basophils Relative: 1 %
Eosinophils Absolute: 0.1 10*3/uL (ref 0.0–0.5)
Eosinophils Relative: 1 %
HCT: 47.1 % — ABNORMAL HIGH (ref 36.0–46.0)
Hemoglobin: 15.8 g/dL — ABNORMAL HIGH (ref 12.0–15.0)
Immature Granulocytes: 1 %
Lymphocytes Relative: 7 %
Lymphs Abs: 1.3 10*3/uL (ref 0.7–4.0)
MCH: 30.2 pg (ref 26.0–34.0)
MCHC: 33.5 g/dL (ref 30.0–36.0)
MCV: 90.1 fL (ref 80.0–100.0)
Monocytes Absolute: 1.2 10*3/uL — ABNORMAL HIGH (ref 0.1–1.0)
Monocytes Relative: 6 %
Neutro Abs: 16.9 10*3/uL — ABNORMAL HIGH (ref 1.7–7.7)
Neutrophils Relative %: 84 %
Platelets: 265 10*3/uL (ref 150–400)
RBC: 5.23 MIL/uL — ABNORMAL HIGH (ref 3.87–5.11)
RDW: 13.8 % (ref 11.5–15.5)
WBC: 19.8 10*3/uL — ABNORMAL HIGH (ref 4.0–10.5)
nRBC: 0 % (ref 0.0–0.2)

## 2022-03-05 NOTE — Progress Notes (Signed)
Pt c/o sob with exertion, intermittent nausea but no vomiting. Pt c/o leg pain worse in the morning but better with use throughout the day. ?

## 2022-03-07 LAB — COMP PANEL: LEUKEMIA/LYMPHOMA

## 2022-03-13 LAB — BCR-ABL1, CML/ALL, PCR, QUANT: Interpretation (BCRAL):: NEGATIVE

## 2022-09-04 ENCOUNTER — Other Ambulatory Visit: Payer: Self-pay | Admitting: *Deleted

## 2022-09-04 DIAGNOSIS — D72829 Elevated white blood cell count, unspecified: Secondary | ICD-10-CM

## 2022-09-04 DIAGNOSIS — D751 Secondary polycythemia: Secondary | ICD-10-CM

## 2022-09-05 ENCOUNTER — Inpatient Hospital Stay (HOSPITAL_BASED_OUTPATIENT_CLINIC_OR_DEPARTMENT_OTHER): Payer: Medicare HMO | Admitting: Medical Oncology

## 2022-09-05 ENCOUNTER — Inpatient Hospital Stay: Payer: Medicare HMO | Attending: Internal Medicine

## 2022-09-05 ENCOUNTER — Encounter: Payer: Self-pay | Admitting: Medical Oncology

## 2022-09-05 VITALS — BP 189/96 | HR 76 | Temp 97.6°F | Ht 59.0 in | Wt 151.0 lb

## 2022-09-05 DIAGNOSIS — D751 Secondary polycythemia: Secondary | ICD-10-CM

## 2022-09-05 DIAGNOSIS — Z79899 Other long term (current) drug therapy: Secondary | ICD-10-CM | POA: Diagnosis not present

## 2022-09-05 DIAGNOSIS — D72829 Elevated white blood cell count, unspecified: Secondary | ICD-10-CM | POA: Diagnosis not present

## 2022-09-05 DIAGNOSIS — N059 Unspecified nephritic syndrome with unspecified morphologic changes: Secondary | ICD-10-CM | POA: Insufficient documentation

## 2022-09-05 DIAGNOSIS — Z148 Genetic carrier of other disease: Secondary | ICD-10-CM | POA: Diagnosis not present

## 2022-09-05 DIAGNOSIS — E876 Hypokalemia: Secondary | ICD-10-CM | POA: Diagnosis not present

## 2022-09-05 LAB — CBC WITH DIFFERENTIAL/PLATELET
Abs Immature Granulocytes: 0.14 K/uL — ABNORMAL HIGH (ref 0.00–0.07)
Basophils Absolute: 0.1 K/uL (ref 0.0–0.1)
Basophils Relative: 0 %
Eosinophils Absolute: 0 K/uL (ref 0.0–0.5)
Eosinophils Relative: 0 %
HCT: 47.4 % — ABNORMAL HIGH (ref 36.0–46.0)
Hemoglobin: 15.8 g/dL — ABNORMAL HIGH (ref 12.0–15.0)
Immature Granulocytes: 1 %
Lymphocytes Relative: 7 %
Lymphs Abs: 1.3 K/uL (ref 0.7–4.0)
MCH: 30.1 pg (ref 26.0–34.0)
MCHC: 33.3 g/dL (ref 30.0–36.0)
MCV: 90.3 fL (ref 80.0–100.0)
Monocytes Absolute: 0.7 K/uL (ref 0.1–1.0)
Monocytes Relative: 3 %
Neutro Abs: 17 K/uL — ABNORMAL HIGH (ref 1.7–7.7)
Neutrophils Relative %: 89 %
Platelets: 260 K/uL (ref 150–400)
RBC: 5.25 MIL/uL — ABNORMAL HIGH (ref 3.87–5.11)
RDW: 13.5 % (ref 11.5–15.5)
WBC: 19.1 K/uL — ABNORMAL HIGH (ref 4.0–10.5)
nRBC: 0 % (ref 0.0–0.2)

## 2022-09-05 NOTE — Progress Notes (Signed)
[place]  Telephone:(336) [phone] Fax:(336) [phone]  ID: [name] OB: 10-May-1941  MR#: [id number]  EVO#:[id number]  Patient Care Team: Lynnell Jude, MD as PCP - General (Family Medicine) Cammie Sickle, MD as Consulting Physician (Internal Medicine)  CHIEF COMPLAINT: Polycythemia; leukocytosis   INTERVAL HISTORY: Patient returns to clinic today for repeat laboratory work and further evaluation.  She presents with her son.   She feels well today. She continues to be seen by a myriad of specialists but to her knowledge her health is stable. Chronic prednisone use. Also has white coat hypertension with home values in the 130s. Asymptomatic today without headache, visual changes, chest pains, SOB. Has mild peripheral edema but states this is from being out all day without having had time to elevate her feet. She has no neurologic complaints.  She denies any recent fevers or illnesses.  She has a good appetite and denies weight loss.  She has no chest pain, shortness of breath, cough, or hemoptysis.  She denies any nausea, vomiting, constipation, or diarrhea.  She has no urinary complaints.  Patient offers no specific complaints today.  REVIEW OF SYSTEMS:   Review of Systems  Constitutional: Negative.  Negative for fever, malaise/fatigue and weight loss.  Respiratory: Negative.  Negative for cough, hemoptysis and shortness of breath.   Cardiovascular: Negative.  Negative for chest pain and leg swelling.  Gastrointestinal: Negative.  Negative for abdominal pain.  Genitourinary: Negative.  Negative for dysuria.  Musculoskeletal: Negative.  Negative for back pain.  Skin: Negative.  Negative for rash.  Neurological: Negative.  Negative for dizziness, seizures, weakness and headaches.  Psychiatric/Behavioral: Negative.  The patient is not nervous/anxious.     As per HPI. Otherwise, a complete review of systems is negative.  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   Heart murmur    Hypercholesterolemia    Hypertension    Lymphadenitis    Renal disorder    Skin cancer    Spontaneous ecchymoses    Vitamin D deficiency     PAST SURGICAL HISTORY: Past Surgical History:  Procedure Laterality Date   CARPAL TUNNEL RELEASE     [facility]   CATARACT EXTRACTION W/PHACO Left 01/21/2022   Procedure: CATARACT EXTRACTION PHACO AND INTRAOCULAR LENS PLACEMENT ([place]) LEFT;  Surgeon: Eulogio Bear, MD;  Location: [place];  Service: Ophthalmology;  Laterality: Left;  12.93 1:12.8   CATARACT EXTRACTION W/PHACO Right 02/04/2022   Procedure: CATARACT EXTRACTION PHACO AND INTRAOCULAR LENS PLACEMENT ([facility]) RIGHT 8.34 00:50.9;  Surgeon: Eulogio Bear, MD;  Location: [place];  Service: Ophthalmology;  Laterality: Right;   ESOPHAGOGASTRODUODENOSCOPY (EGD) WITH PROPOFOL N/A 04/14/2020   Procedure: ESOPHAGOGASTRODUODENOSCOPY (EGD) WITH PROPOFOL;  Surgeon: Lucilla Lame, MD;  Location: [facility] ENDOSCOPY;  Service: Endoscopy;  Laterality: N/A;   HIP ARTHROPLASTY Left 11/18/2020   Procedure: ARTHROPLASTY BIPOLAR HIP (HEMIARTHROPLASTY);  Surgeon: Corky Mull, MD;  Location: [facility];  Service: Orthopedics;  Laterality: Left;   TONSILLECTOMY     TRANSTHORACIC ECHOCARDIOGRAM  05/01/2015    EF 60-65%. Abnormal relaxation. Mild aortic stenosis (peak gradient 19 mmHg).    FAMILY HISTORY: Family History  Problem Relation Age of Onset   Heart disease Father    Heart failure Father     ADVANCED DIRECTIVES (Y/N):  N  HEALTH MAINTENANCE: Social History   Tobacco Use   Smoking status:  Never   Smokeless tobacco: Never  Vaping Use   Vaping Use: Never used  Substance Use Topics   Alcohol use: No   Drug use: No     Colonoscopy:  PAP:  Bone density:  Lipid panel:  Allergies  Allergen Reactions   Influenza Vaccines Anaphylaxis    Coreg [Carvedilol] Nausea Only and Other (See Comments)    Syncope   Statins Other (See Comments)    Other Reaction: CNS DISORDER   Chlorthalidone Diarrhea    Current Outpatient Medications  Medication Sig Dispense Refill   amLODipine (NORVASC) 5 MG tablet Take 5 mg by mouth daily.     aspirin 81 MG chewable tablet Chew by mouth.     atenolol (TENORMIN) 25 MG tablet Take 0.5 tablets (12.5 mg total) by mouth daily.     CALCIUM-VITAMIN D PO Take by mouth daily.     clonazePAM (KLONOPIN) 0.5 MG tablet Take 1 tablet (0.5 mg total) by mouth 2 (two) times daily as needed for anxiety. 10 tablet 0   cloNIDine (CATAPRES) 0.2 MG tablet Take 0.2 mg by mouth 2 (two) times daily.     Ferrous Sulfate (SLOW FE) 142 (45 Fe) MG TBCR Take 45 mg of iron by mouth daily.     furosemide (LASIX) 20 MG tablet Take by mouth.     losartan (COZAAR) 100 MG tablet Take 100 mg by mouth daily.     predniSONE (DELTASONE) 10 MG tablet Take 1 tablet (10 mg total) by mouth daily. 42 tablet 0   vitamin B-12 1000 MCG tablet Take 1 tablet (1,000 mcg total) by mouth daily. 30 tablet 0   No current facility-administered medications for this visit.    OBJECTIVE: Vitals:   09/05/22 1359  BP: (!) 189/96  Pulse: 76  Temp: 97.6 F (36.4 C)  SpO2: 95%     Body mass index is 30.5 kg/m.    ECOG FS:0 - Asymptomatic  General: Well-developed, well-nourished, no acute distress.  Sitting in a wheelchair. Eyes: Pink conjunctiva, anicteric sclera. HEENT: Normocephalic, moist mucous membranes. Lungs: No audible wheezing or coughing. Heart: Regular rate with 3/6 systolic murmur, 1+ pitting edema bilaterally up to mid shin Abdomen: Soft, nontender, no obvious distention. Musculoskeletal: No edema, cyanosis, or clubbing. Neuro: Alert, answering all questions appropriately. Cranial nerves grossly intact. Skin: No rashes or petechiae noted. Psych: Normal affect.   LAB RESULTS:  Lab Results  Component Value Date   NA 130  (L) 03/02/2021   K 5.1 03/02/2021   CL 92 (L) 03/02/2021   CO2 25 03/02/2021   GLUCOSE 158 (H) 03/02/2021   BUN 27 (H) 03/02/2021   CREATININE 1.45 (H) 03/02/2021   CALCIUM 9.6 03/02/2021   PROT 5.8 (L) 03/15/2016   ALBUMIN 3.0 (L) 04/14/2020   AST 38 03/15/2016   ALT 30 03/15/2016   ALKPHOS 59 03/15/2016   BILITOT 1.3 (H) 03/15/2016   GFRNONAA 37 (L) 03/02/2021   GFRAA 43 (L) 08/15/2020    Lab Results  Component Value Date   WBC 19.1 (H) 09/05/2022   NEUTROABS 17.0 (H) 09/05/2022   HGB 15.8 (H) 09/05/2022   HCT 47.4 (H) 09/05/2022   MCV 90.3 09/05/2022   PLT 260 09/05/2022     STUDIES: No results found.  ASSESSMENT: Polycythemia.  PLAN:    1.  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  Patient's hemoglobin is 15.8 today which is exactly what it was 6 months ago without intervention.  All of her other laboratory work including  iron stores, erythropoietin, carbon monoxide, and JAK2 mutation are all either negative or within normal limits per Dr. Gary Fleet work up. No intervention such as phlebotomy bone marrow biopsy needed at this time.  Return to clinic in 6 months with repeat laboratory work and further evaluation.   2.  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  Peripheral blood flow cytometry showed absolute monocytosis with phenotypic aberrancy and BCR-ABL mutation labs were negative. RTC 6 months.  3.  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 Continue follow-up and treatment per nephrology. Will also ask nephrology about potassium supplementation- for now will increase oral potassium intake through foods.  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   Patient expressed understanding and was in agreement with this plan. She also understands that She can call clinic at any time with any questions, concerns, or complaints.     Hughie Closs, PA-C   09/05/2022 2:34 PM

## 2023-01-10 IMAGING — US US RENAL
1 series · 14 of 25 positions shown · non-contrast
Comparison: Report from CT dated 03/02/2021.

CLINICAL DATA: Evaluate kidney lesion.

EXAM:
RENAL / URINARY TRACT ULTRASOUND COMPLETE

[Series 1: us renal · 0.26mm/px · 48 acquisitions, 14 frames shown]
[im 1/48]
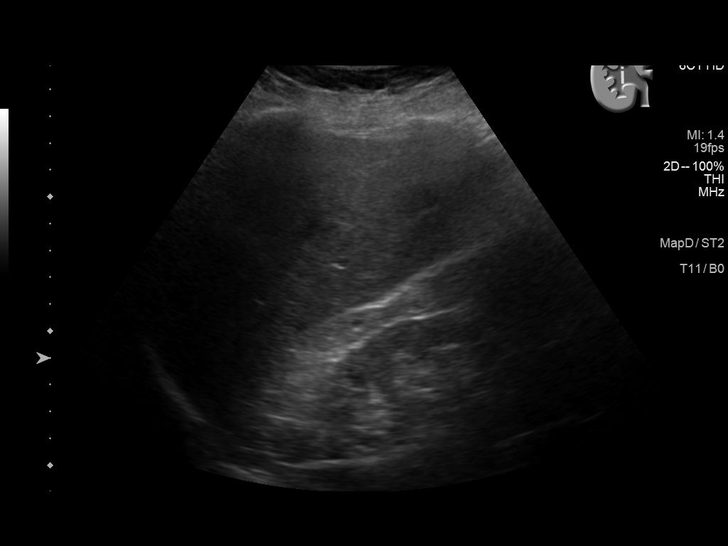
[im 4/48]
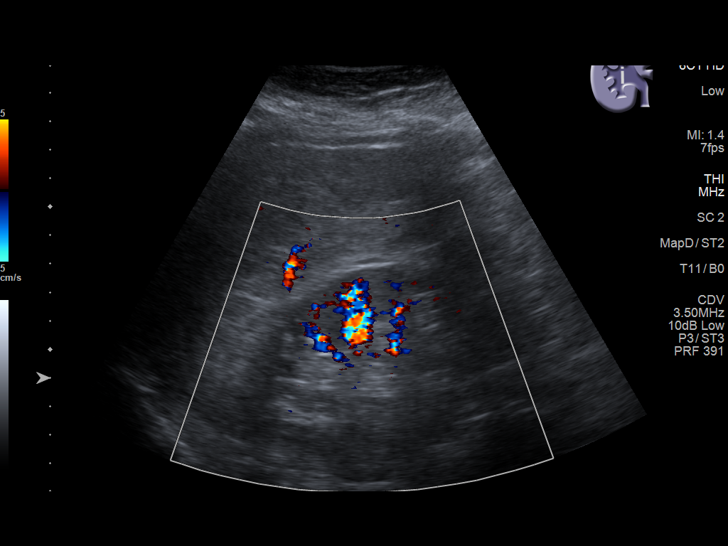
[im 8/48]
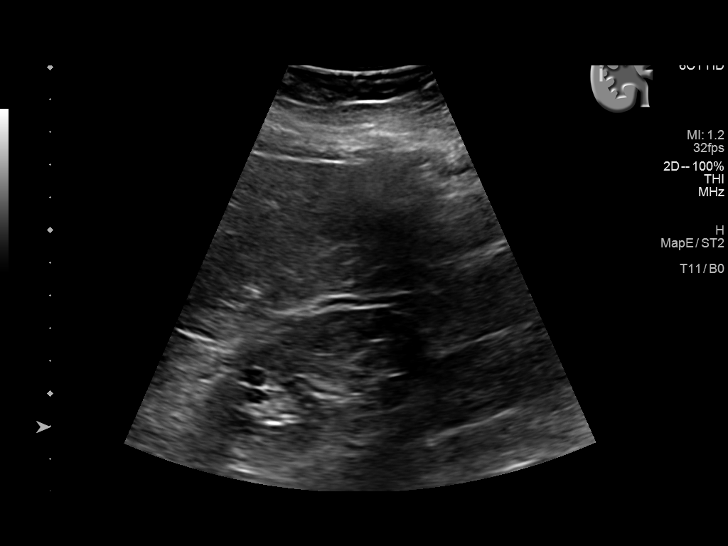
[im 12/48]
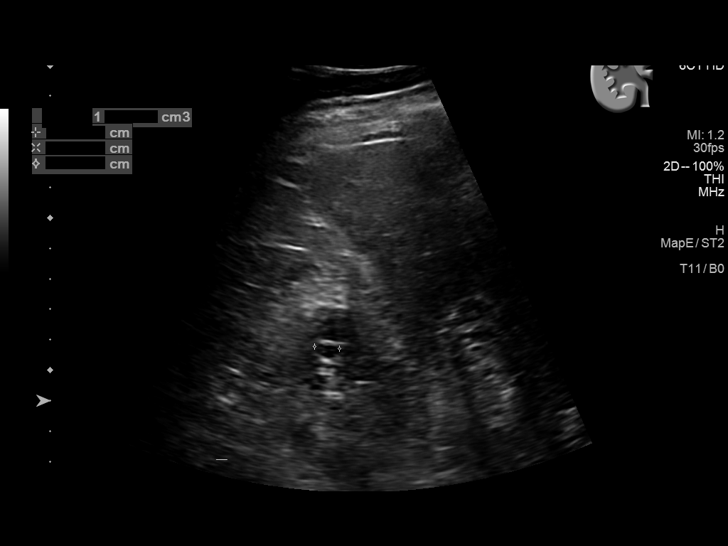
[im 16/48]
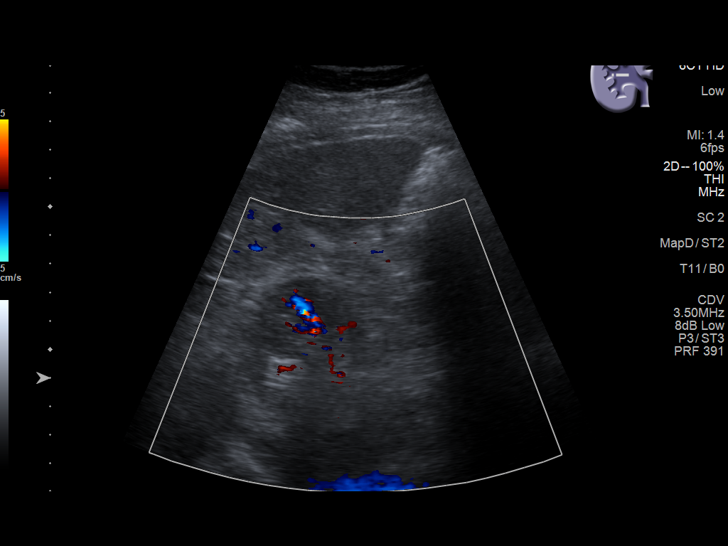
[im 18/48]
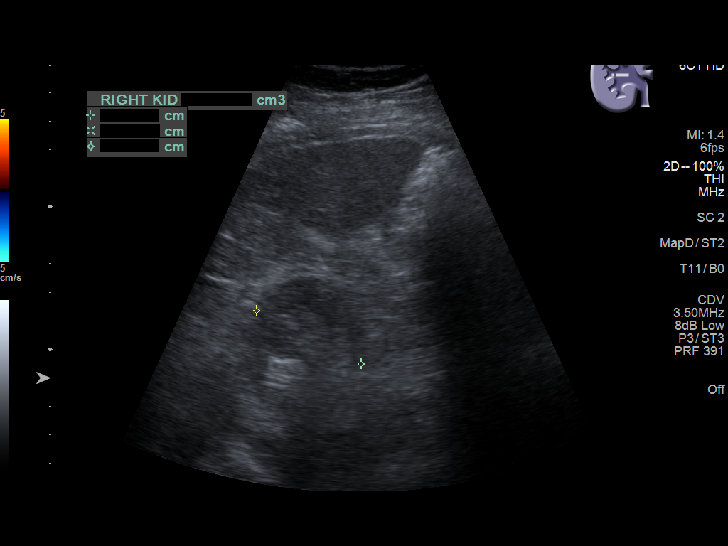
[im 22/48]
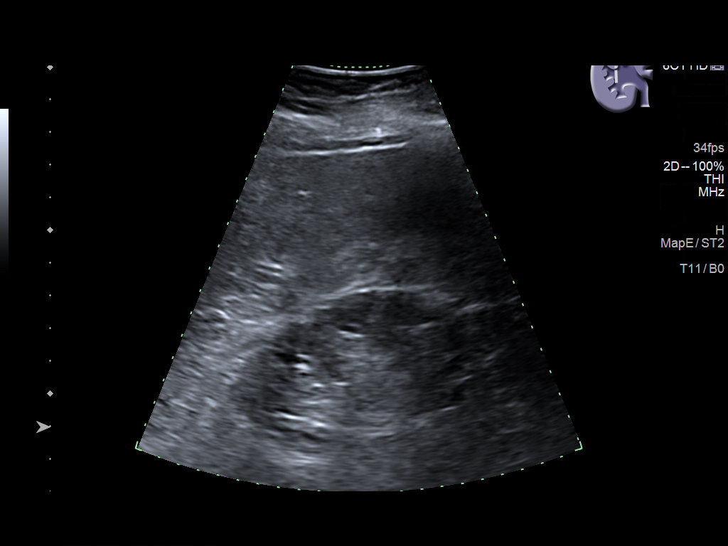
[im 26/48]
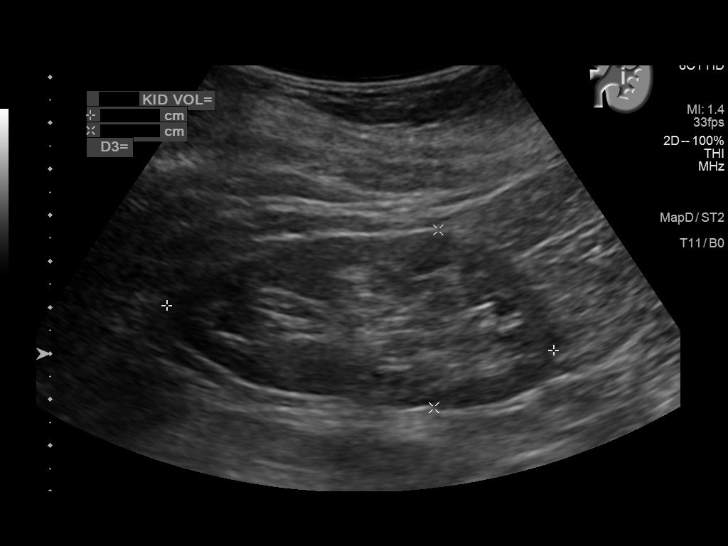
[im 30/48]
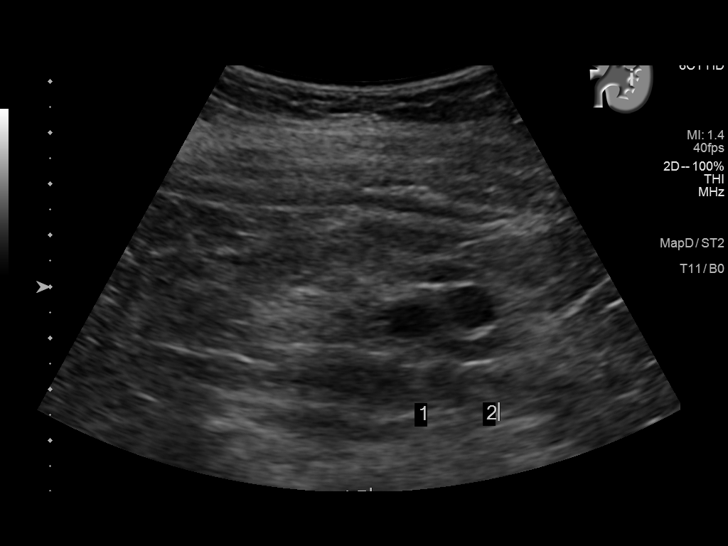
[im 32/48]
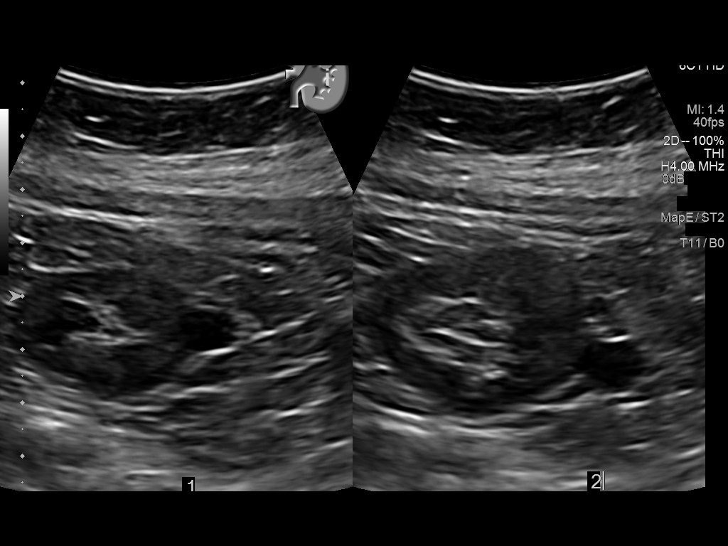
[im 36/48]
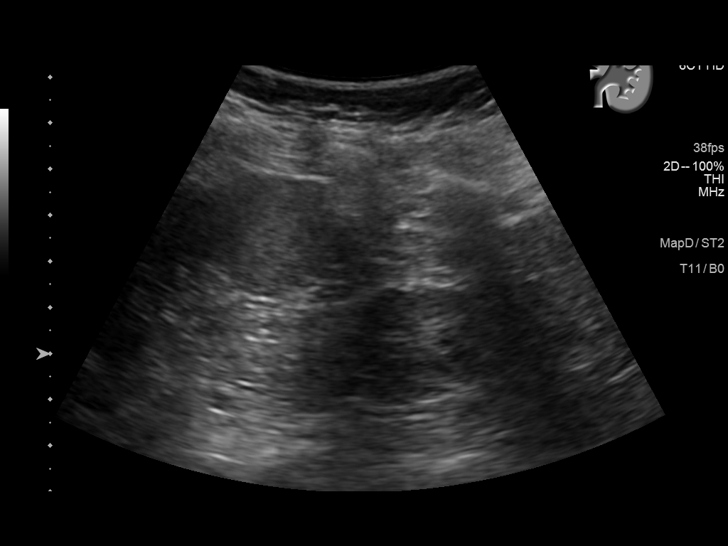
[im 40/48]
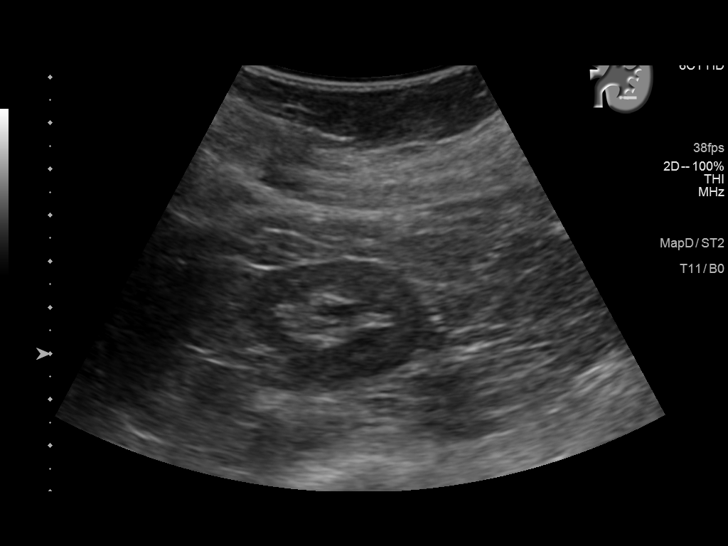
[im 44/48]
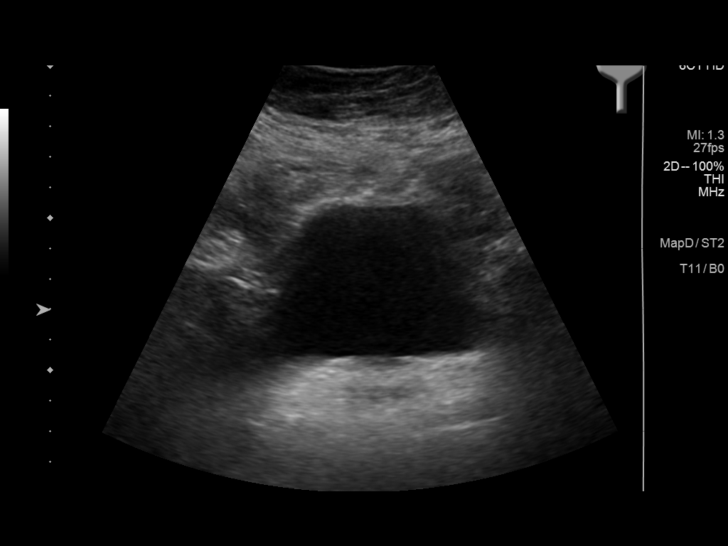
[im 48/48]
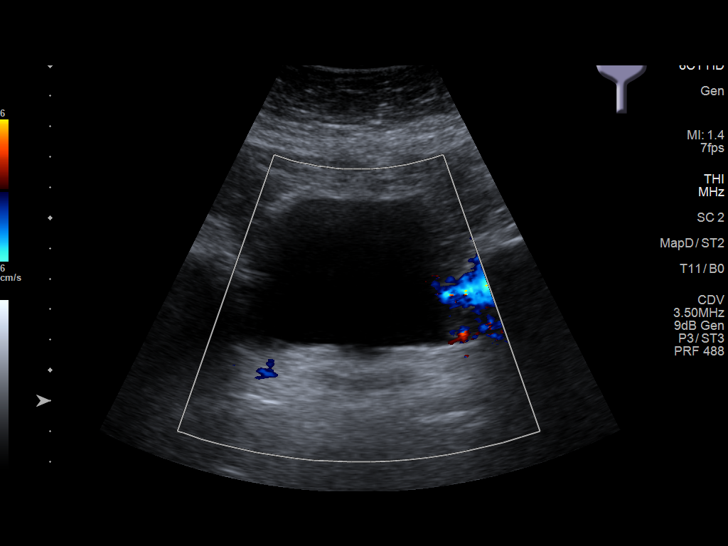

[14 of 25 positions shown; findings below may reference images not displayed]

FINDINGS: Right Kidney:

Renal measurements: 7.9 x 4.1 x 4.1 cm = volume: 68.9 mL. Normal
echogenicity. Tiny cyst within the upper pole of the right kidney
measures 0.8 x 0.5 x 0.8 cm.

Left Kidney:

Renal measurements: 8.5 x 3.9 x 4.1 cm = volume: 70.4 mL.
Echogenicity normal. No hydronephrosis. Within the inferior pole of
left kidney there are 2 cysts. The largest measures 1.3 x 1.3 x
cm. Adjacent to this is a 1.1 x 1.0 x 0.8 cm cyst.

Bladder:

Appears normal for degree of bladder distention.

Other:

None.
IMPRESSION: 1. No acute abnormality.
2. Bilateral kidney cysts. Small cyst in the upper pole of right
kidney measures 8 mm. No corresponding abnormality noted within the
interpolar right kidney as described on CT from 03/02/2021. As
mentioned in the report from 03/02/21 consider follow-up imaging with
dedicated renal protocol CT or MRI without and with contrast
material for more definitive characterization of the hyperdense
right kidney lesion
3. Inferior pole left kidney cysts.

## 2023-03-06 ENCOUNTER — Encounter: Payer: Self-pay | Admitting: Oncology

## 2023-03-06 ENCOUNTER — Inpatient Hospital Stay (HOSPITAL_BASED_OUTPATIENT_CLINIC_OR_DEPARTMENT_OTHER): Payer: Medicare HMO | Admitting: Oncology

## 2023-03-06 ENCOUNTER — Inpatient Hospital Stay: Payer: Medicare HMO | Attending: Internal Medicine

## 2023-03-06 VITALS — BP 167/79 | HR 78 | Temp 96.9°F | Resp 18 | Ht 59.0 in | Wt 157.0 lb

## 2023-03-06 DIAGNOSIS — Z79899 Other long term (current) drug therapy: Secondary | ICD-10-CM | POA: Diagnosis not present

## 2023-03-06 DIAGNOSIS — N059 Unspecified nephritic syndrome with unspecified morphologic changes: Secondary | ICD-10-CM | POA: Diagnosis not present

## 2023-03-06 DIAGNOSIS — D72829 Elevated white blood cell count, unspecified: Secondary | ICD-10-CM | POA: Insufficient documentation

## 2023-03-06 DIAGNOSIS — D751 Secondary polycythemia: Secondary | ICD-10-CM | POA: Insufficient documentation

## 2023-03-06 DIAGNOSIS — Z148 Genetic carrier of other disease: Secondary | ICD-10-CM | POA: Insufficient documentation

## 2023-03-06 LAB — CBC WITH DIFFERENTIAL/PLATELET
Abs Immature Granulocytes: 0.12 10*3/uL — ABNORMAL HIGH (ref 0.00–0.07)
Basophils Absolute: 0.1 10*3/uL (ref 0.0–0.1)
Basophils Relative: 1 %
Eosinophils Absolute: 0 10*3/uL (ref 0.0–0.5)
Eosinophils Relative: 0 %
HCT: 45.8 % (ref 36.0–46.0)
Hemoglobin: 15.4 g/dL — ABNORMAL HIGH (ref 12.0–15.0)
Immature Granulocytes: 1 %
Lymphocytes Relative: 8 %
Lymphs Abs: 1.3 10*3/uL (ref 0.7–4.0)
MCH: 31.1 pg (ref 26.0–34.0)
MCHC: 33.6 g/dL (ref 30.0–36.0)
MCV: 92.5 fL (ref 80.0–100.0)
Monocytes Absolute: 0.6 10*3/uL (ref 0.1–1.0)
Monocytes Relative: 4 %
Neutro Abs: 14.6 10*3/uL — ABNORMAL HIGH (ref 1.7–7.7)
Neutrophils Relative %: 86 %
Platelets: 236 10*3/uL (ref 150–400)
RBC: 4.95 MIL/uL (ref 3.87–5.11)
RDW: 13.6 % (ref 11.5–15.5)
WBC: 16.7 10*3/uL — ABNORMAL HIGH (ref 4.0–10.5)
nRBC: 0 % (ref 0.0–0.2)

## 2023-03-06 LAB — FERRITIN: Ferritin: 208 ng/mL (ref 11–307)

## 2023-03-06 LAB — BASIC METABOLIC PANEL
Anion gap: 9 (ref 5–15)
BUN: 23 mg/dL (ref 8–23)
CO2: 30 mmol/L (ref 22–32)
Calcium: 9.7 mg/dL (ref 8.9–10.3)
Chloride: 98 mmol/L (ref 98–111)
Creatinine, Ser: 1.46 mg/dL — ABNORMAL HIGH (ref 0.44–1.00)
GFR, Estimated: 36 mL/min — ABNORMAL LOW (ref >60)
Glucose, Bld: 185 mg/dL — ABNORMAL HIGH (ref 70–99)
Potassium: 4.1 mmol/L (ref 3.5–5.1)
Sodium: 137 mmol/L (ref 135–145)

## 2023-03-06 NOTE — Progress Notes (Signed)
Having trouble breathing at night. Swelling in left foot. Has "blood spots" on torso.

## 2023-03-06 NOTE — Progress Notes (Signed)
[place]  Telephone:(336) [phone] Fax:(336) [phone]  ID: Linda Bradley OB: 1941-07-10  MR#: [id number]  [id number]  Patient Care Team: Lynnell Jude, MD as PCP - General (Family Medicine)  CHIEF COMPLAINT: Polycythemia  INTERVAL HISTORY: Patient returns to clinic today for repeat laboratory work and routine 80-monthevaluation.  She currently feels well and is at her baseline. She has no neurologic complaints.  She denies any recent fevers or illnesses.  She has a good appetite and denies weight loss.  She has no chest pain, shortness of breath, cough, or hemoptysis.  She denies any nausea, vomiting, constipation, or diarrhea.  She has no urinary complaints.  Patient offers no further specific complaints today.  REVIEW OF SYSTEMS:   Review of Systems  Constitutional:  Positive for malaise/fatigue. Negative for fever and weight loss.  Respiratory:  Positive for shortness of breath. Negative for cough and hemoptysis.   Cardiovascular:  Positive for leg swelling. Negative for chest pain.  Gastrointestinal: Negative.  Negative for abdominal pain.  Genitourinary: Negative.  Negative for dysuria.  Musculoskeletal: Negative.  Negative for back pain.  Skin: Negative.  Negative for rash.  Neurological: Negative.  Negative for dizziness, seizures, weakness and headaches.  Psychiatric/Behavioral: Negative.  The patient is not nervous/anxious.     As per HPI. Otherwise, a complete review of systems is negative.  PAST MEDICAL HISTORY: Past Medical History:  Diagnosis Date   Anxiety disorder    Dermatitis    [place]   Heart murmur    Hypercholesterolemia    Hypertension    Lymphadenitis    Renal disorder    Skin cancer    Spontaneous ecchymoses    Vitamin D deficiency     PAST SURGICAL HISTORY: Past Surgical History:  Procedure Laterality Date   CARPAL TUNNEL RELEASE     [facility]   CATARACT EXTRACTION W/PHACO Left  01/21/2022   Procedure: CATARACT EXTRACTION [place] AND [place]TRAOCULAR LENS PLACEMENT [place];  Surgeon: KEulogio Bear MD;  [place]  Service: Ophthalmology;  Laterality: Left;  12.93 1:12.8   CATARACT EXTRACTION W/PHACO Right 02/04/2022   Procedure: CATARACT EXTRACTION PHACO AND INTRAOCULAR LENS PLACEMENT ([facility]) RIGHT 8.34 00:50.9;  Surgeon: KEulogio Bear MD;  [place]  Service: Ophthalmology;  Laterality: Right;   ESOPHAGOGASTRODUODENOSCOPY (EGD) WITH PROPOFOL N/A 04/14/2020   Procedure: ESOPHAGOGASTRODUODENOSCOPY (EGD) WITH PROPOFOL;  Surgeon: WLucilla Lame MD;  [facility]  Service: Endoscopy;  Laterality: N/A;   HIP ARTHROPLASTY Left 11/18/2020   Procedure: ARTHROPLASTY BIPOLAR HIP (HEMIARTHROPLASTY);  Surgeon: PCorky Mull MD;  Location: [facility];  Service: Orthopedics;  Laterality: Left;   TONSILLECTOMY     TRANSTHORACIC ECHOCARDIOGRAM  05/01/2015    EF 60-65%. Abnormal relaxation. Mild aortic stenosis (peak gradient 19 mmHg).    FAMILY HISTORY: Family History  Problem Relation Age of Onset   Heart disease Father    Heart failure Father     ADVANCED DIRECTIVES (Y/N):  N  HEALTH MAINTENANCE: Social History   Tobacco Use   Smoking status: Never   Smokeless tobacco: Never  Vaping Use   Vaping Use: Never used  Substance Use Topics   Alcohol use: No   Drug use: No     Colonoscopy:  PAP:  Bone density:  Lipid panel:  Allergies  Allergen Reactions   Influenza Vaccines Anaphylaxis   Coreg [Carvedilol] Nausea Only and Other (See Comments)    Syncope   Statins Other (See  Comments)    Other Reaction: CNS DISORDER   Chlorthalidone Diarrhea    Current Outpatient Medications  Medication Sig Dispense Refill   amLODipine (NORVASC) 5 MG tablet Take 5 mg by mouth daily.     aspirin 81 MG chewable tablet Chew by mouth.     atenolol (TENORMIN) 25 MG tablet Take 0.5 tablets (12.5 mg total) by mouth daily.      Calcium Carb-Cholecalciferol (CALCIUM HIGH POTENCY/VITAMIN D) 600-5 MG-MCG TABS Take by mouth.     CALCIUM-VITAMIN D PO Take by mouth daily.     clonazePAM (KLONOPIN) 0.5 MG tablet Take 1 tablet (0.5 mg total) by mouth 2 (two) times daily as needed for anxiety. 10 tablet 0   cloNIDine (CATAPRES) 0.2 MG tablet Take 0.2 mg by mouth 2 (two) times daily.     Ferrous Sulfate (SLOW FE) 142 (45 Fe) MG TBCR Take 45 mg of iron by mouth daily.     furosemide (LASIX) 20 MG tablet Take by mouth.     losartan (COZAAR) 100 MG tablet Take 100 mg by mouth daily.     predniSONE (DELTASONE) 10 MG tablet Take 1 tablet (10 mg total) by mouth daily. 42 tablet 0   vitamin B-12 1000 MCG tablet Take 1 tablet (1,000 mcg total) by mouth daily. 30 tablet 0   No current facility-administered medications for this visit.    OBJECTIVE: Vitals:   03/06/23 1356  BP: (!) 167/79  Pulse: 78  Resp: 18  Temp: (!) 96.9 F (36.1 C)  SpO2: 98%     Body mass index is 31.71 kg/m.    ECOG FS:0 - Asymptomatic  General: Well-developed, well-nourished, no acute distress. Eyes: Pink conjunctiva, anicteric sclera. HEENT: Normocephalic, moist mucous membranes. Lungs: No audible wheezing or coughing. Heart: Regular rate and rhythm. Abdomen: Soft, nontender, no obvious distention. Musculoskeletal: 1-2+ edema. Neuro: Alert, answering all questions appropriately. Cranial nerves grossly intact. Skin: No rashes or petechiae noted. Psych: Normal affect.  LAB RESULTS:  Lab Results  Component Value Date   NA 137 03/06/2023   K 4.1 03/06/2023   CL 98 03/06/2023   CO2 30 03/06/2023   GLUCOSE 185 (H) 03/06/2023   BUN 23 03/06/2023   CREATININE 1.46 (H) 03/06/2023   CALCIUM 9.7 03/06/2023   PROT 5.8 (L) 03/15/2016   ALBUMIN 3.0 (L) 04/14/2020   AST 38 03/15/2016   ALT 30 03/15/2016   ALKPHOS 59 03/15/2016   BILITOT 1.3 (H) 03/15/2016   GFRNONAA 36 (L) 03/06/2023   GFRAA 43 (L) 08/15/2020    Lab Results  Component Value  Date   WBC 16.7 (H) 03/06/2023   NEUTROABS 14.6 (H) 03/06/2023   HGB 15.4 (H) 03/06/2023   HCT 45.8 03/06/2023   MCV 92.5 03/06/2023   PLT 236 03/06/2023     STUDIES: No results found.  ASSESSMENT: Polycythemia.  PLAN:    1.  Polycythemia: Chronic and unchanged.  Patient's most recent hemoglobin is 15.4.  Previously, all of her other laboratory work including iron stores, erythropoietin, carbon monoxide, and JAK2 mutation are all either negative or within normal limits.  No intervention is needed.  She does not require bone marrow biopsy.  After lengthy discussion with the patient and her son, it was agreed upon that no further follow-up is necessary.  Please refer patient back if there are any questions or concerns. 2.  Leukocytosis: Chronic and unchanged.  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  Peripheral blood flow cytometry and  BCR-ABL mutation are negative. 3.  Glomerulonephritis: Continue follow-up and treatment per nephrology.  Patient last received Rituxan on April 18, 2021. 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   Patient expressed understanding and was in agreement with this plan. She also understands that She can call clinic at any time with any questions, concerns, or complaints.     Lloyd Huger, MD   03/06/2023 2:39 PM

## 2023-09-23 ENCOUNTER — Emergency Department: Payer: Medicare HMO

## 2023-09-23 ENCOUNTER — Inpatient Hospital Stay
Admission: EM | Admit: 2023-09-23 | Discharge: 2023-09-29 | DRG: 481 | Disposition: A | Discharge status: Skilled Nursing Facility | Payer: Medicare HMO | Attending: Internal Medicine | Admitting: Internal Medicine

## 2023-09-23 ENCOUNTER — Other Ambulatory Visit: Payer: Self-pay

## 2023-09-23 DIAGNOSIS — Z887 Allergy status to serum and vaccine status: Secondary | ICD-10-CM

## 2023-09-23 DIAGNOSIS — D72829 Elevated white blood cell count, unspecified: Secondary | ICD-10-CM | POA: Diagnosis present

## 2023-09-23 DIAGNOSIS — E559 Vitamin D deficiency, unspecified: Secondary | ICD-10-CM | POA: Diagnosis present

## 2023-09-23 DIAGNOSIS — Z6830 Body mass index (BMI) 30.0-30.9, adult: Secondary | ICD-10-CM | POA: Diagnosis not present

## 2023-09-23 DIAGNOSIS — I2510 Atherosclerotic heart disease of native coronary artery without angina pectoris: Secondary | ICD-10-CM | POA: Diagnosis present

## 2023-09-23 DIAGNOSIS — Z86718 Personal history of other venous thrombosis and embolism: Secondary | ICD-10-CM

## 2023-09-23 DIAGNOSIS — N184 Chronic kidney disease, stage 4 (severe): Secondary | ICD-10-CM | POA: Diagnosis present

## 2023-09-23 DIAGNOSIS — Z8249 Family history of ischemic heart disease and other diseases of the circulatory system: Secondary | ICD-10-CM | POA: Diagnosis not present

## 2023-09-23 DIAGNOSIS — W010XXA Fall on same level from slipping, tripping and stumbling without subsequent striking against object, initial encounter: Secondary | ICD-10-CM | POA: Diagnosis present

## 2023-09-23 DIAGNOSIS — S72141A Displaced intertrochanteric fracture of right femur, initial encounter for closed fracture: Principal | ICD-10-CM | POA: Diagnosis present

## 2023-09-23 DIAGNOSIS — S7291XA Unspecified fracture of right femur, initial encounter for closed fracture: Secondary | ICD-10-CM | POA: Diagnosis not present

## 2023-09-23 DIAGNOSIS — S51011A Laceration without foreign body of right elbow, initial encounter: Secondary | ICD-10-CM | POA: Diagnosis present

## 2023-09-23 DIAGNOSIS — S72001A Fracture of unspecified part of neck of right femur, initial encounter for closed fracture: Principal | ICD-10-CM

## 2023-09-23 DIAGNOSIS — Z85828 Personal history of other malignant neoplasm of skin: Secondary | ICD-10-CM

## 2023-09-23 DIAGNOSIS — L309 Dermatitis, unspecified: Secondary | ICD-10-CM | POA: Diagnosis present

## 2023-09-23 DIAGNOSIS — Z5986 Financial insecurity: Secondary | ICD-10-CM | POA: Diagnosis not present

## 2023-09-23 DIAGNOSIS — D631 Anemia in chronic kidney disease: Secondary | ICD-10-CM | POA: Diagnosis present

## 2023-09-23 DIAGNOSIS — M069 Rheumatoid arthritis, unspecified: Secondary | ICD-10-CM | POA: Diagnosis present

## 2023-09-23 DIAGNOSIS — I252 Old myocardial infarction: Secondary | ICD-10-CM

## 2023-09-23 DIAGNOSIS — Z8673 Personal history of transient ischemic attack (TIA), and cerebral infarction without residual deficits: Secondary | ICD-10-CM

## 2023-09-23 DIAGNOSIS — M3130 Wegener's granulomatosis without renal involvement: Secondary | ICD-10-CM | POA: Diagnosis present

## 2023-09-23 DIAGNOSIS — X501XXA Overexertion from prolonged static or awkward postures, initial encounter: Secondary | ICD-10-CM | POA: Diagnosis not present

## 2023-09-23 DIAGNOSIS — Z888 Allergy status to other drugs, medicaments and biological substances status: Secondary | ICD-10-CM

## 2023-09-23 DIAGNOSIS — I130 Hypertensive heart and chronic kidney disease with heart failure and stage 1 through stage 4 chronic kidney disease, or unspecified chronic kidney disease: Secondary | ICD-10-CM | POA: Diagnosis present

## 2023-09-23 DIAGNOSIS — I889 Nonspecific lymphadenitis, unspecified: Secondary | ICD-10-CM | POA: Diagnosis present

## 2023-09-23 DIAGNOSIS — E66811 Obesity, class 1: Secondary | ICD-10-CM | POA: Diagnosis present

## 2023-09-23 DIAGNOSIS — I5032 Chronic diastolic (congestive) heart failure: Secondary | ICD-10-CM | POA: Diagnosis present

## 2023-09-23 DIAGNOSIS — Z7982 Long term (current) use of aspirin: Secondary | ICD-10-CM

## 2023-09-23 DIAGNOSIS — Z7952 Long term (current) use of systemic steroids: Secondary | ICD-10-CM

## 2023-09-23 DIAGNOSIS — W19XXXA Unspecified fall, initial encounter: Secondary | ICD-10-CM

## 2023-09-23 DIAGNOSIS — I129 Hypertensive chronic kidney disease with stage 1 through stage 4 chronic kidney disease, or unspecified chronic kidney disease: Secondary | ICD-10-CM | POA: Diagnosis present

## 2023-09-23 DIAGNOSIS — E669 Obesity, unspecified: Secondary | ICD-10-CM | POA: Diagnosis present

## 2023-09-23 DIAGNOSIS — E7800 Pure hypercholesterolemia, unspecified: Secondary | ICD-10-CM | POA: Diagnosis present

## 2023-09-23 DIAGNOSIS — Y92009 Unspecified place in unspecified non-institutional (private) residence as the place of occurrence of the external cause: Secondary | ICD-10-CM | POA: Diagnosis not present

## 2023-09-23 DIAGNOSIS — I350 Nonrheumatic aortic (valve) stenosis: Secondary | ICD-10-CM | POA: Diagnosis present

## 2023-09-23 DIAGNOSIS — Z96642 Presence of left artificial hip joint: Secondary | ICD-10-CM | POA: Diagnosis present

## 2023-09-23 DIAGNOSIS — Z79899 Other long term (current) drug therapy: Secondary | ICD-10-CM

## 2023-09-23 DIAGNOSIS — D638 Anemia in other chronic diseases classified elsewhere: Secondary | ICD-10-CM | POA: Diagnosis present

## 2023-09-23 DIAGNOSIS — Z952 Presence of prosthetic heart valve: Secondary | ICD-10-CM

## 2023-09-23 DIAGNOSIS — R5381 Other malaise: Secondary | ICD-10-CM | POA: Diagnosis not present

## 2023-09-23 DIAGNOSIS — F419 Anxiety disorder, unspecified: Secondary | ICD-10-CM | POA: Diagnosis present

## 2023-09-23 DIAGNOSIS — I10 Essential (primary) hypertension: Secondary | ICD-10-CM | POA: Diagnosis present

## 2023-09-23 DIAGNOSIS — R079 Chest pain, unspecified: Secondary | ICD-10-CM | POA: Diagnosis present

## 2023-09-23 LAB — CBC
HCT: 44.9 % (ref 36.0–46.0)
Hemoglobin: 14.8 g/dL (ref 12.0–15.0)
MCH: 29.2 pg (ref 26.0–34.0)
MCHC: 33 g/dL (ref 30.0–36.0)
MCV: 88.6 fL (ref 80.0–100.0)
Platelets: 214 10*3/uL (ref 150–400)
RBC: 5.07 MIL/uL (ref 3.87–5.11)
RDW: 12.9 % (ref 11.5–15.5)
WBC: 20.2 10*3/uL — ABNORMAL HIGH (ref 4.0–10.5)
nRBC: 0 % (ref 0.0–0.2)

## 2023-09-23 LAB — BASIC METABOLIC PANEL
Anion gap: 15 (ref 5–15)
BUN: 41 mg/dL — ABNORMAL HIGH (ref 8–23)
CO2: 23 mmol/L (ref 22–32)
Calcium: 9.1 mg/dL (ref 8.9–10.3)
Chloride: 99 mmol/L (ref 98–111)
Creatinine, Ser: 1.59 mg/dL — ABNORMAL HIGH (ref 0.44–1.00)
GFR, Estimated: 33 mL/min — ABNORMAL LOW (ref >60)
Glucose, Bld: 210 mg/dL — ABNORMAL HIGH (ref 70–99)
Potassium: 3.5 mmol/L (ref 3.5–5.1)
Sodium: 137 mmol/L (ref 135–145)

## 2023-09-23 LAB — TYPE AND SCREEN
ABO/RH(D): A POS
Antibody Screen: NEGATIVE

## 2023-09-23 LAB — CK: Total CK: <5 U/L — ABNORMAL LOW (ref 38–234)

## 2023-09-23 LAB — TROPONIN I (HIGH SENSITIVITY): Troponin I (High Sensitivity): 11 ng/L (ref <18)

## 2023-09-23 LAB — D-DIMER, QUANTITATIVE: D-Dimer, Quant: 3.45 ug{FEU}/mL — ABNORMAL HIGH (ref 0.00–0.50)

## 2023-09-23 MED ORDER — POLYETHYLENE GLYCOL 3350 17 G PO PACK
17.0000 g | PACK | Freq: Every day | ORAL | Status: DC | PRN
  Filled 2023-09-23: qty 1

## 2023-09-23 MED ORDER — METHOCARBAMOL 1000 MG/10ML IJ SOLN: 500.0000 mg | Freq: Four times a day (QID) | INTRAVENOUS | Status: DC | PRN

## 2023-09-23 MED ORDER — MORPHINE SULFATE (PF) 2 MG/ML IV SOLN
2.0000 mg | INTRAVENOUS | Status: DC | PRN
  Administered 2023-09-23: 2 mg via INTRAVENOUS
  Filled 2023-09-23: qty 1

## 2023-09-23 MED ORDER — LACTATED RINGERS IV SOLN: INTRAVENOUS | Status: AC

## 2023-09-23 MED ORDER — HYDRALAZINE HCL 20 MG/ML IJ SOLN: 5.0000 mg | INTRAMUSCULAR | Status: DC | PRN

## 2023-09-23 MED ORDER — CARVEDILOL 25 MG PO TABS
25.0000 mg | ORAL_TABLET | Freq: Two times a day (BID) | ORAL | Status: DC
  Administered 2023-09-23 – 2023-09-29 (×12): 25 mg via ORAL
  Filled 2023-09-23 (×7): qty 1
  Filled 2023-09-23: qty 4
  Filled 2023-09-23 (×5): qty 1

## 2023-09-23 MED ORDER — ASPIRIN 81 MG PO CHEW
81.0000 mg | CHEWABLE_TABLET | Freq: Every day | ORAL | Status: DC
  Administered 2023-09-24 – 2023-09-29 (×6): 81 mg via ORAL
  Filled 2023-09-23 (×6): qty 1

## 2023-09-23 MED ORDER — METHOCARBAMOL 500 MG PO TABS: 500.0000 mg | ORAL_TABLET | Freq: Four times a day (QID) | ORAL | Status: DC | PRN

## 2023-09-23 MED ORDER — DOCUSATE SODIUM 100 MG PO CAPS
100.0000 mg | ORAL_CAPSULE | Freq: Two times a day (BID) | ORAL | Status: DC
  Administered 2023-09-23 – 2023-09-29 (×7): 100 mg via ORAL
  Filled 2023-09-23 (×11): qty 1

## 2023-09-23 MED ORDER — FENTANYL CITRATE PF 50 MCG/ML IJ SOSY
50.0000 ug | PREFILLED_SYRINGE | Freq: Once | INTRAMUSCULAR | Status: AC
  Administered 2023-09-23: 50 ug via INTRAVENOUS
  Filled 2023-09-23: qty 1

## 2023-09-23 MED ORDER — PREDNISONE 10 MG PO TABS
10.0000 mg | ORAL_TABLET | Freq: Every day | ORAL | Status: DC
  Administered 2023-09-24 – 2023-09-29 (×6): 10 mg via ORAL
  Filled 2023-09-23 (×6): qty 1

## 2023-09-23 MED ORDER — HYDROCODONE-ACETAMINOPHEN 5-325 MG PO TABS
1.0000 | ORAL_TABLET | Freq: Four times a day (QID) | ORAL | Status: DC | PRN
  Administered 2023-09-24: 1 via ORAL
  Filled 2023-09-23: qty 1

## 2023-09-23 MED ORDER — FENTANYL CITRATE PF 50 MCG/ML IJ SOSY: 25.0000 ug | PREFILLED_SYRINGE | INTRAMUSCULAR | Status: DC | PRN

## 2023-09-23 MED ORDER — HEPARIN SODIUM (PORCINE) 5000 UNIT/ML IJ SOLN
5000.0000 [IU] | Freq: Once | INTRAMUSCULAR | Status: AC
  Administered 2023-09-23: 5000 [IU] via SUBCUTANEOUS
  Filled 2023-09-23: qty 1

## 2023-09-23 MED ORDER — AMLODIPINE BESYLATE 5 MG PO TABS
5.0000 mg | ORAL_TABLET | Freq: Every day | ORAL | Status: DC
  Administered 2023-09-24 – 2023-09-29 (×6): 5 mg via ORAL
  Filled 2023-09-23 (×7): qty 1

## 2023-09-23 MED ORDER — CLONAZEPAM 0.5 MG PO TABS
0.5000 mg | ORAL_TABLET | Freq: Every day | ORAL | Status: DC
  Administered 2023-09-23 – 2023-09-28 (×6): 0.5 mg via ORAL
  Filled 2023-09-23 (×6): qty 1

## 2023-09-23 MED ORDER — ATENOLOL 25 MG PO TABS: 12.5000 mg | ORAL_TABLET | Freq: Every day | ORAL | Status: DC

## 2023-09-23 MED ORDER — CLONAZEPAM 0.5 MG PO TABS: 0.5000 mg | ORAL_TABLET | Freq: Two times a day (BID) | ORAL | Status: DC | PRN

## 2023-09-23 MED ORDER — CLONIDINE HCL 0.1 MG PO TABS: 0.2000 mg | ORAL_TABLET | Freq: Two times a day (BID) | ORAL | Status: DC

## 2023-09-23 NOTE — ED Provider Notes (Signed)
[facility] Provider Note  Event Date/Time  First MD Initiated Contact with Patient 09/23/23 1707   (approximate)  History   Fall  HPI  Linda Bradley is a 82 y.o. female history of vasculitis, previous TAVR, her left hip surgery  Patient reports in her normal health.  She was at home.  Something fell off the refrigerator and she leaned down to pick it up and stumbled falling directly onto her right side.  She denies head injury.  No headache no neck pain no back pain.  She reports there was immediate pain in her right hip area.  She laid on the floor for about 1 hour  She does also report having some slight intermittent chest pain over the last few days.  Not currently having chest pain.  Does not take any blood thinners.  She denies any headache.  She reports she hit her head just slightly when she fell, but denies any headache or other concerns.  She reports that was very minimal.  Reports moderate pain fairly well-controlled with the medicine she was given.  No numbness or weakness in the right leg.  No abdominal pain     Physical Exam   Triage Vital Signs: ED Triage Vitals  Encounter Vitals Group     BP 09/23/23 1639 (!) 148/74     Systolic BP Percentile --      Diastolic BP Percentile --      Pulse Rate 09/23/23 1637 87     Resp 09/23/23 1637 20     Temp 09/23/23 1637 97.7 F (36.5 C)     Temp src --      SpO2 09/23/23 1637 96 %     Weight 09/23/23 1637 150 lb (68 kg)     Height 09/23/23 1637 4\' 11"  (1.499 m)     Head Circumference --      Peak Flow --      Pain Score 09/23/23 1636 8     Pain Loc --      Pain Education --      Exclude from Growth Chart --     Most recent vital signs: Vitals:   09/23/23 1639 09/23/23 1700  BP: (!) 148/74 (!) 166/81  Pulse:  89  Resp:  19  Temp:    SpO2:  95%     General: Awake, no distress.  CV:  Good peripheral perfusion.  Normal  rate with slight systolic murmur Resp:  Normal effort.  Clear  bilateral Abd:  No distention.  Soft nontender nondistended Other:  Shortening and slight external rotation right hip.  No tenderness to palpation across the foot knee tib-fib or distal femur.  Palpable pulses in the right foot including dorsalis pedis and posterior tibial.  Warm well-perfused.  Demonstrates normal toe wiggle and plantar dorsi flexion of the right leg  Moves all other extremities well no neurodeficits pain or discomfort.  Normocephalic atraumatic.  No cervical or thoracic tenderness.   ED Results / Procedures / Treatments   Labs (all labs ordered are listed, but only abnormal results are displayed) Labs Reviewed  CBC - Abnormal; Notable for the following components:      Result Value   WBC 20.2 (*)    All other components within normal limits  BASIC METABOLIC PANEL - Abnormal; Notable for the following components:   Glucose, Bld 210 (*)    BUN 41 (*)    Creatinine, Ser 1.59 (*)    GFR, Estimated 33 (*)  All other components within normal limits  CK  TYPE AND SCREEN  TROPONIN I (HIGH SENSITIVITY)     EKG  Interpreted by me at 1640 heart rate 85 QRS 90 QTc 480 Artifact present.  Normal sinus rhythm no obvious frank ischemia.  Mild T wave inversions favored to be present in the lateral precordial leads   RADIOLOGY  X-rays right hip interpreted by me as acute right hip fracture   DG Femur Min 2 Views Right  Result Date: 09/23/2023 CLINICAL DATA:  Pre right hip fracture surgery evaluation. EXAM: RIGHT FEMUR 2 VIEWS COMPARISON:  Pelvis and right hip radiographs obtained earlier today. FINDINGS: The previously demonstrated comminuted intertrochanteric fracture on the right is not included on the current images. The remainder of the femur is unremarkable with no additional fractures seen. Mild patellofemoral and medial joint space degenerative changes. Diffuse arterial calcifications. IMPRESSION: 1. The previously demonstrated comminuted intertrochanteric fracture  on the right is not included on the current images. 2. No additional fractures. Electronically Signed   By: Beckie Salts M.D.   On: 09/23/2023 19:42   DG Hip Unilat W or Wo Pelvis 2-3 Views Right  Result Date: 09/23/2023 CLINICAL DATA:  Fall today with right hip pain. Shortening and rotation. EXAM: DG HIP (WITH OR WITHOUT PELVIS) 2-3V RIGHT COMPARISON:  None Available. FINDINGS: Comminuted and displaced intertrochanteric right femur fracture. There is mild proximal migration of the femoral shaft. Apex lateral angulation. Femoral head is seated in the acetabulum, no hip dislocation. No pubic rami fracture. Pubic symphysis and sacroiliac joints are congruent. 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 There are prominent vascular calcifications IMPRESSION: Comminuted and displaced intertrochanteric right femur fracture. Electronically Signed   By: Narda Rutherford M.D.   On: 09/23/2023 19:06   DG Chest 1 View  Result Date: 09/23/2023 CLINICAL DATA:  Unwitnessed fall today. Initial encounter. Hip pain. EXAM: CHEST  1 VIEW COMPARISON:  Chest radiograph 11/18/2020 FINDINGS: Low lung volumes. Upper normal heart size. Post TAVR. Elevation of the right hemidiaphragm. No pneumothorax, large pleural effusion or focal airspace opacity. Bronchovascular crowding versus vascular congestion. IMPRESSION: Low lung volumes with bronchovascular crowding versus vascular congestion. Electronically Signed   By: Narda Rutherford M.D.   On: 09/23/2023 19:04      PROCEDURES:  Critical Care performed: No  Procedures   MEDICATIONS ORDERED IN ED: Medications  fentaNYL (SUBLIMAZE) injection 50 mcg (50 mcg Intravenous Given 09/23/23 1700)  fentaNYL (SUBLIMAZE) injection 50 mcg (50 mcg Intravenous Given 09/23/23 1746)     IMPRESSION / MDM / ASSESSMENT AND PLAN / ED COURSE  I reviewed the triage vital signs and the nursing notes.                              Differential diagnosis includes, but is not limited to,  right hip fracture.  No evidence to support significant head injury or cervical injury or spinal injury.  No other findings to do note a obvious injury from the fall.  She does report having some slight intermittent chest pain over the weekend but is not having active chest pain now.  ECG without clear ischemia.  Troponin added and normal  Labs notable for leukocytosis.  This appears to be somewhat chronic.  Renal insufficiency creatinine 1.6  Patient's presentation is most consistent with acute complicated illness / injury requiring diagnostic workup.   The patient is on the cardiac monitor to evaluate for evidence of  arrhythmia and/or significant heart rate changes.      ----------------------------------------- 5:54 PM on 09/23/2023 ----------------------------------------- Dr. Allean Found at bedside seeing and evaluating patient.  Advises we will plan for surgery tomorrow  Consulted with and patient accepted to hospital service by Dr. Renaldo Reel.  After discussing with Dr. Allena Katz, Dr. Corrie Mckusick advising plan to also exclude pulmonary embolism which I think is reasonable given her history of previous DVT though that based on the clinical history is usually just was provoked by previous orthopedic surgery  She does not presently have any hypoxia tachycardia or chest pain but did have some intermittent chest pain over the last couple days.  Further workup for rule out PE and defer to hospitalist who have discussed the case with me and their plan to workup.  She has chronic renal disease and a GFR of 33, marginal for angiography study and discussing with hospitalist I suspect that they will provide perfusion study  Dr. Allena Katz aware of elevated D-dimer at 9:05 PM, discussed with her and she will follow-up with further exclusion of PE  FINAL CLINICAL IMPRESSION(S) / ED DIAGNOSES   Final diagnoses:  Closed fracture of right hip, initial encounter [facility])     Rx / DC Orders   ED Discharge Orders      None        Note:  This document was prepared using Dragon voice recognition software and may include unintentional dictation errors.   Sharyn Creamer, MD 09/23/23 2108

## 2023-09-23 NOTE — ED Triage Notes (Signed)
Pt to ED [facility] from home for unwitnessed fall today, was in floor approx 1 hour before help. C/o right hip pain, shortening and rotation.  50 mcg fentanyl PTA Denies blood thinners Skin tear to upper right arm Intermittent chest pain since saturday

## 2023-09-23 NOTE — Consult Note (Signed)
ORTHOPAEDIC CONSULTATION  REQUESTING PHYSICIAN: Sharyn Creamer, MD  Chief Complaint:   Right intertrochanteric femur fracture  History of Present Illness: Linda Bradley is a 82 y.o. female presents to the emergency room after a mechanical unwitnessed fall today when try to get something out of her refrigerator.  Patient denies head strike or loss of consciousness.  She reports she fell onto her right side due to right hip pain.  She reports a history of a left hip fracture in the past which was treated with a hemiarthroplasty.  She reports she did well after that fracture and is back to baseline activities using a rolling walker for ambulation.  She denies any pre-existing right hip pain prior to her fall.  She does report that she did have a small skin tear on the right distal humeral area which is currently dressed.  Patient does report she had some intermittent chest pain since Saturday does not have any chest pain at this time.  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  She has had blood clots in the past and a GI bleed after being placed on Xarelto per family.  We did discuss that we will need to use DVT prophylaxis after her surgery and we will have to weigh the risk of bleeding over the risk of blood clot.  Patient denies any chest pain at this time, shortness of breath, fever, chills, current illness, nausea or vomiting.  Denies any numbness and tingling to her lower extremity.  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   Heart murmur    Hypercholesterolemia    Hypertension    Lymphadenitis    Renal disorder    Skin cancer    Spontaneous ecchymoses    Vitamin D deficiency    Past Surgical History:  Procedure Laterality Date   CARPAL TUNNEL RELEASE     [facility]   CATARACT EXTRACTION W/PHACO Left 01/21/2022    Procedure: CATARACT EXTRACTION PHACO AND INTRAOCULAR LENS PLACEMENT (IOC) LEFT;  Surgeon: Nevada Crane, MD;  Location: [facility];  Service: Ophthalmology;  Laterality: Left;  12.93 1:12.8   CATARACT EXTRACTION W/PHACO Right 02/04/2022   Procedure: CATARACT EXTRACTION PHACO AND INTRAOCULAR LENS PLACEMENT (IOC) RIGHT 8.34 00:50.9;  Surgeon: Nevada Crane, MD;  Location: [facility];  Service: Ophthalmology;  Laterality: Right;   ESOPHAGOGASTRODUODENOSCOPY (EGD) WITH PROPOFOL N/A 04/14/2020   Procedure: ESOPHAGOGASTRODUODENOSCOPY (EGD) WITH PROPOFOL;  Surgeon: Midge Minium, MD;  Location: [facility];  Service: Endoscopy;  Laterality: N/A;   HIP ARTHROPLASTY Left 11/18/2020   Procedure: ARTHROPLASTY BIPOLAR HIP (HEMIARTHROPLASTY);  Surgeon: Christena Flake, MD;  Location: [facility];  Service: Orthopedics;  Laterality: Left;   TONSILLECTOMY     TRANSTHORACIC ECHOCARDIOGRAM  05/01/2015    EF 60-65%. Abnormal relaxation. Mild aortic stenosis (peak gradient 19 mmHg).   Social History   Socioeconomic History   Marital status: Widowed    Spouse name: Not on file   Number of children: Not on file   Years of education: Not on file   Highest education level: Not on file  Occupational History   Not on file  Tobacco Use   Smoking status: Never   Smokeless tobacco: Never  Vaping Use   Vaping status: Never Used  Substance and Sexual Activity   Alcohol use: No   Drug use: No   Sexual activity: Not  on file  Other Topics Concern   Not on file  Social History Narrative   Not on file   Social Determinants of Health   Financial Resource Strain: Medium Risk (06/09/2023)   Received from [facility]   Overall Financial Resource Strain (CARDIA)    Difficulty of Paying Living Expenses: Somewhat hard  Food Insecurity: No Food Insecurity (06/09/2023)   Received from [facility]   Hunger Vital Sign    Worried About Running Out of Food in the  Last Year: Never true    Ran Out of Food in the Last Year: Never true  Transportation Needs: No Transportation Needs (06/09/2023)   Received from [facility] - Transportation    In the past 12 months, has lack of transportation kept you from medical appointments or from getting medications?: No    Lack of Transportation (Non-Medical): No  Physical Activity: Not on file  Stress: Not on file  Social Connections: Not on file   Family History  Problem Relation Age of Onset   Heart disease Father    Heart failure Father    Allergies  Allergen Reactions   Influenza Vaccines Anaphylaxis   Coreg [Carvedilol] Nausea Only and Other (See Comments)    Syncope   Statins Other (See Comments)    Other Reaction: CNS DISORDER   Chlorthalidone Diarrhea   Prior to Admission medications   Medication Sig Start Date End Date Taking? Authorizing Provider  amLODipine (NORVASC) 5 MG tablet Take 5 mg by mouth daily. 08/29/20   [provider]  aspirin 81 MG chewable tablet Chew by mouth. 09/04/21   [provider]  atenolol (TENORMIN) 25 MG tablet Take 0.5 tablets (12.5 mg total) by mouth daily. 11/24/20   Lurene Shadow, MD  Calcium Carb-Cholecalciferol (CALCIUM HIGH POTENCY/VITAMIN D) 600-5 MG-MCG TABS Take by mouth.    [provider]  CALCIUM-VITAMIN D PO Take by mouth daily.    [provider]  clonazePAM (KLONOPIN) 0.5 MG tablet Take 1 tablet (0.5 mg total) by mouth 2 (two) times daily as needed for anxiety. 11/24/20   Lurene Shadow, MD  cloNIDine (CATAPRES) 0.2 MG tablet Take 0.2 mg by mouth 2 (two) times daily. 02/08/21   [provider]  Ferrous Sulfate (SLOW FE) 142 (45 Fe) MG TBCR Take 45 mg of iron by mouth daily.    [provider]  furosemide (LASIX) 20 MG tablet Take by mouth. 12/04/21   [provider]  losartan (COZAAR) 100 MG tablet Take 100 mg by mouth daily. 12/09/20   [provider]   predniSONE (DELTASONE) 10 MG tablet Take 1 tablet (10 mg total) by mouth daily. 07/17/20   Cuthriell, Delorise Royals, PA-C  vitamin B-12 1000 MCG tablet Take 1 tablet (1,000 mcg total) by mouth daily. 03/28/20   Enedina Finner, MD   No results found.  Positive ROS: All other systems have been reviewed and were otherwise negative with the exception of those mentioned in the HPI and as above.  Physical Exam: General:  Alert, no acute distress Psychiatric:  Patient is competent for consent with normal mood and affect   Cardiovascular:  No pedal edema Respiratory:  No wheezing, non-labored breathing GI:  Abdomen is soft and non-tender Skin:  No lesions in the area of chief complaint Neurologic:  Sensation intact distally Lymphatic:  No axillary or cervical lymphadenopathy  Orthopedic Exam:  Right lower extremity  shortened and externally rotated And intact  over the hip with tenderness palpation mild swelling over the hip No tenderness palpation over the distal femur, knee, tibia, ankle, foot or toes Able to dorsiflex and plantarflex the foot and toes sensation intact to the foot intact dorsalis pedis pulse  Secondary survey No tenderness to palpation over other bony prominences in the lower extremities or bilateral upper extremities well-dressed skin tear over the right distal humeral area no active bleeding No pain with logroll or simulated axial loading of the left lower extremity All compartments soft No tenderness to palpation over the cervical or thoracic spine, no bony step-off Motor grossly intact throughout, no focal deficits Sensation grossly intact throughout, no focal deficits Good distal pulses and capillary refill on all extremities  X-rays:  X-rays reviewed myself which show a displaced intertrochanteric right femur fracture.  No evidence of any distal femur fractures noted.  Assessment: Right intertrochanteric femur fracture  Plan: Cleda is an 82 year old female presents  with a displaced right intertrochanteric femur fracture.  I had a conversation with the patient and her children about the surgical versus nonsurgical treatment options for this type of fracture I discussed the risks and benefits of each.  Under shared decision making model the patient has elected to move forward with surgical intervention for her right hip fracture.  The patient will need a intramedullary nail.  A long discussion took place with the patient describing what a intra [place] nail is and what the procedure would entail. The xrays were reviewed with the patient and the implants were discussed. The ability to secure the implant utilizing screw blade fixation was discussed. Surgical exposures were discussed with the patient.    The hospitalization and post-operative care and rehabilitation were also discussed. The use of perioperative antibiotics and DVT prophylaxis were discussed.  We did specifically rotation about her risk of bleeding versus clotting with her history of DVTs and GI bleeding.  the risk, benefits and alternatives to a surgical intervention were discussed at length with the patient. The patient was also advised of risks related to the medical comorbidities. A lengthy discussion took place to review the most common complications including but not limited to: deep vein thrombosis, pulmonary embolus, heart attack, stroke, infection, wound breakdown, dislocation, numbness, leg length in-equality, damage to nerves, intraoperative fracture, malunion/ nonunion, implant cut out, screw breakage, implant breakage, tendon,muscles, arteries or other blood vessels, death and other possible complications from anesthesia. The patient was told that we will take steps to minimize these risks by using sterile technique, antibiotics and DVT prophylaxis when appropriate and follow the patient postoperatively in the office setting to monitor progress. The possibility of recurrent pain, no improvement in pain  and actual worsening of pain were also discussed with the patient.      The benefits of surgery were discussed with the patient including the potential for improving the patient's current clinical condition through operative intervention. Alternatives to surgical intervention including conservative management were also discussed in detail. All questions were answered to the satisfaction of the patient. The patient participated and agreed to the plan of care as well as the use of the recommended implants for their surgery.    Plan for surgery tomorrow 10-12-23 N.p.o. after midnight for the operating room Hold anticoagulation after midnight\ Will complete full consent forms in the preoperative holding area prior to surgery     Reinaldo Berber MD  Beeper #:  [phone]  09/23/2023 6:14 PM

## 2023-09-23 NOTE — H&P (Signed)
History and Physical    Patient: Linda Bradley HQI:[id number] DOB: 15-Oct-1941 DOA: 09/23/2023 DOS: the patient was seen and examined on 09/24/2023 PCP: Dortha Kern, MD  Patient coming from: Home   Chief Complaint:  Chief Complaint  Patient presents with   Fall   Chest Pain    HPI: [name] is a 82 y.o. female with medical history significant for hypertension, rheumatoid arthritis, chronic kidney disease,hyperlipidemia, dermatitis, anxiety disorder, lymphadenitis, vitamin D deficiency, anemia of chronic disease, aortic stenosis, anxiety presenting with fall and right hip fracture.  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 Patient was seen by orthopedic in the emergency room with plans for operative repair in a.m. and was told patient would be scheduled for surgery around 1230.  Patient had complaints of chest pain today and has been having chest pain in the past few weeks.  No reports of palpitation or shortness of breath. Initial vitals showed blood pressure of 148/74 O2 sats of 96%, heart rate of 87 respirations of 20. Blood work showed CKD stage IIIb with a creatinine of 1.59 EGFR of 33, glucose 210 and BUN of 41 otherwise normal electrolytes, LFTs added on and are pending. CBC shows leukocytosis of 20.2 normal hemoglobin and normal platelet count. Patient had an elevated D-dimer, normal troponin, CPK less than 5. Chest x-ray today showed low lung volumes with bronchovascular crowding versus vascular congestion.  On auscultation patient does not have crackles or rales. Femur x-ray showed comminuted intertrochanteric fracture on the right.  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   Heart murmur    Hypercholesterolemia    Hypertension    Lymphadenitis    Renal disorder     Skin cancer    Spontaneous ecchymoses    Vitamin D deficiency    Past Surgical History:  Procedure Laterality Date   CARPAL TUNNEL RELEASE     [facility]   CATARACT EXTRACTION W/PHACO Left 01/21/2022   Procedure: CATARACT EXTRACTION PHACO AND INTRAOCULAR LENS PLACEMENT (IOC) LEFT;  Surgeon: Nevada Crane, MD;  Location: [facility];  Service: Ophthalmology;  Laterality: Left;  12.93 1:12.8   CATARACT EXTRACTION W/PHACO Right 02/04/2022   Procedure: CATARACT EXTRACTION PHACO AND INTRAOCULAR LENS PLACEMENT (IOC) RIGHT 8.34 00:50.9;  Surgeon: Nevada Crane, MD;  Location: [facility];  Service: Ophthalmology;  Laterality: Right;   ESOPHAGOGASTRODUODENOSCOPY (EGD) WITH PROPOFOL N/A 04/14/2020   Procedure: ESOPHAGOGASTRODUODENOSCOPY (EGD) WITH PROPOFOL;  Surgeon: Midge Minium, MD;  Location: [facility];  Service: Endoscopy;  Laterality: N/A;   HIP ARTHROPLASTY Left 11/18/2020   Procedure: ARTHROPLASTY BIPOLAR HIP (HEMIARTHROPLASTY);  Surgeon: Christena Flake, MD;  Location: [facility];  Service: Orthopedics;  Laterality: Left;   TONSILLECTOMY     TRANSTHORACIC ECHOCARDIOGRAM  05/01/2015    EF 60-65%. Abnormal relaxation. Mild aortic stenosis (peak gradient 19 mmHg).   Social History:   reports that she has never smoked. She has never used smokeless tobacco. She reports that she does not drink alcohol and does not use drugs.  Allergies  Allergen Reactions   Influenza Vaccines Anaphylaxis   Coreg [Carvedilol] Nausea Only and Other (See Comments)    Syncope   Statins Other (See Comments)    Other Reaction: CNS DISORDER   Chlorthalidone  Diarrhea    Family History  Problem Relation Age of Onset   Heart disease Father    Heart failure Father     Prior to Admission medications   Medication Sig Start Date End Date Taking? Authorizing Provider  amLODipine (NORVASC) 5 MG tablet Take 5 mg by mouth daily. 08/29/20   [provider]  aspirin 81 MG chewable tablet  Chew by mouth. 09/04/21   [provider]  atenolol (TENORMIN) 25 MG tablet Take 0.5 tablets (12.5 mg total) by mouth daily. 11/24/20   Lurene Shadow, MD  Calcium Carb-Cholecalciferol (CALCIUM HIGH POTENCY/VITAMIN D) 600-5 MG-MCG TABS Take by mouth.    [provider]  CALCIUM-VITAMIN D PO Take by mouth daily.    [provider]  clonazePAM (KLONOPIN) 0.5 MG tablet Take 1 tablet (0.5 mg total) by mouth 2 (two) times daily as needed for anxiety. 11/24/20   Lurene Shadow, MD  cloNIDine (CATAPRES) 0.2 MG tablet Take 0.2 mg by mouth 2 (two) times daily. 02/08/21   [provider]  Ferrous Sulfate (SLOW FE) 142 (45 Fe) MG TBCR Take 45 mg of iron by mouth daily.    [provider]  furosemide (LASIX) 20 MG tablet Take by mouth. 12/04/21   [provider]  losartan (COZAAR) 100 MG tablet Take 100 mg by mouth daily. 12/09/20   [provider]  predniSONE (DELTASONE) 10 MG tablet Take 1 tablet (10 mg total) by mouth daily. 07/17/20   Cuthriell, Delorise Royals, PA-C  vitamin B-12 1000 MCG tablet Take 1 tablet (1,000 mcg total) by mouth daily. 03/28/20   Enedina Finner, MD     Vitals:   09/23/23 2130 09/23/23 2200 09/23/23 2300 09/24/23 0100  BP: (!) 124/55 120/66 138/66 (!) 147/76  Pulse: 80 76 77 84  Resp: 18 17 16 17   Temp:   97.9 F (36.6 C) 97.6 F (36.4 C)  TempSrc:   Oral   SpO2: 95% 95% 94% 94%  Weight:      Height:       Physical Exam Vitals and nursing note reviewed.  Constitutional:      General: She is not in acute distress. HENT:     Head: Normocephalic and atraumatic.     Right Ear: Hearing normal.     Left Ear: Hearing normal.     Nose: Nose normal. No nasal deformity.     Mouth/Throat:     Lips: Pink.     Tongue: No lesions.     Pharynx: Oropharynx is clear.  Eyes:     General: Lids are normal.     Extraocular Movements: Extraocular movements intact.  Cardiovascular:     Rate and Rhythm: Regular rhythm. Tachycardia  present.     Pulses: Normal pulses.     Heart sounds: Murmur heard.  Pulmonary:     Effort: Pulmonary effort is normal.     Breath sounds: Normal breath sounds.  Abdominal:     General: Bowel sounds are normal. There is no distension.     Palpations: Abdomen is soft. There is no mass.     Tenderness: There is no abdominal tenderness.  Musculoskeletal:       Arms:     Right lower leg: No edema.     Left lower leg: No edema.  Skin:    General: Skin is warm.  Neurological:     General: No focal deficit present.     Mental Status: She is alert and oriented to person, place,  and time.     Cranial Nerves: Cranial nerves 2-12 are intact.  Psychiatric:        Attention and Perception: Attention normal.        Mood and Affect: Mood normal.        Speech: Speech normal.        Behavior: Behavior normal. Behavior is cooperative.      Labs on Admission: I have personally reviewed following labs and imaging studies  CBC: Recent Labs  Lab 09/23/23 1643  WBC 20.2*  HGB 14.8  HCT 44.9  MCV 88.6  PLT 214   Basic Metabolic Panel: Recent Labs  Lab 09/23/23 1643  NA 137  K 3.5  CL 99  CO2 23  GLUCOSE 210*  BUN 41*  CREATININE 1.59*  CALCIUM 9.1   GFR: Estimated Creatinine Clearance: 23.7 mL/min (A) (by C-G formula based on SCr of 1.59 mg/dL (H)). Liver Function Tests: No results for input(s): "AST", "ALT", "ALKPHOS", "BILITOT", "PROT", "ALBUMIN" in the last 168 hours. No results for input(s): "LIPASE", "AMYLASE" in the last 168 hours. No results for input(s): "AMMONIA" in the last 168 hours. Coagulation Profile: No results for input(s): "INR", "PROTIME" in the last 168 hours. Cardiac Enzymes: Recent Labs  Lab 09/23/23 1643  CKTOTAL <5*   BNP (last 3 results) No results for input(s): "PROBNP" in the last 8760 hours. HbA1C: No results for input(s): "HGBA1C" in the last 72 hours. CBG: No results for input(s): "GLUCAP" in the last 168 hours. Lipid Profile: No  results for input(s): "CHOL", "HDL", "LDLCALC", "TRIG", "CHOLHDL", "LDLDIRECT" in the last 72 hours. Thyroid Function Tests: No results for input(s): "TSH", "T4TOTAL", "FREET4", "T3FREE", "THYROIDAB" in the last 72 hours. Anemia Panel: No results for input(s): "VITAMINB12", "FOLATE", "FERRITIN", "TIBC", "IRON", "RETICCTPCT" in the last 72 hours. Urinalysis    Component Value Date/Time   COLORURINE YELLOW (A) 11/18/2020 0104   APPEARANCEUR CLEAR (A) 11/18/2020 0104   LABSPEC 1.011 11/18/2020 0104   PHURINE 5.0 11/18/2020 0104   GLUCOSEU 50 (A) 11/18/2020 0104   HGBUR LARGE (A) 11/18/2020 0104   BILIRUBINUR NEGATIVE 11/18/2020 0104   KETONESUR NEGATIVE 11/18/2020 0104   PROTEINUR 100 (A) 11/18/2020 0104   NITRITE NEGATIVE 11/18/2020 0104   LEUKOCYTESUR NEGATIVE 11/18/2020 0104   Unresulted Labs (From admission, onward)     Start     Ordered   09/24/23 0500  Basic metabolic panel  Tomorrow morning,   R        09/23/23 1950   09/24/23 0500  CBC  Tomorrow morning,   R        09/23/23 1950   09/23/23 2040  Hepatic function panel  Once,   AD        09/23/23 2040            Medications  HYDROcodone-acetaminophen (NORCO/VICODIN) 5-325 MG per tablet 1-2 tablet (has no administration in time range)  morphine (PF) 2 MG/ML injection 2 mg (2 mg Intravenous Given 09/23/23 2214)  methocarbamol (ROBAXIN) tablet 500 mg (has no administration in time range)    Or  methocarbamol (ROBAXIN) 500 mg in dextrose 5 % 50 mL IVPB (has no administration in time range)  docusate sodium (COLACE) capsule 100 mg (100 mg Oral Given 09/23/23 2214)  polyethylene glycol (MIRALAX / GLYCOLAX) packet 17 g (has no administration in time range)  lactated ringers infusion ( Intravenous New Bag/Given 09/23/23 2313)  amLODipine (NORVASC) tablet 5 mg (5 mg Oral Not Given 09/23/23 2040)  predniSONE (DELTASONE) tablet  10 mg (has no administration in time range)  aspirin chewable tablet 81 mg (has no administration in  time range)  hydrALAZINE (APRESOLINE) injection 5 mg (has no administration in time range)  carvedilol (COREG) tablet 25 mg (25 mg Oral Given 09/23/23 2041)  fentaNYL (SUBLIMAZE) injection 25 mcg (has no administration in time range)  clonazePAM (KLONOPIN) tablet 0.5 mg (0.5 mg Oral Given 09/23/23 2214)  fentaNYL (SUBLIMAZE) injection 50 mcg (50 mcg Intravenous Given 09/23/23 1700)  fentaNYL (SUBLIMAZE) injection 50 mcg (50 mcg Intravenous Given 09/23/23 1746)  fentaNYL (SUBLIMAZE) injection 50 mcg (50 mcg Intravenous Given 09/23/23 1854)  heparin injection 5,000 Units (5,000 Units Subcutaneous Given 09/23/23 2313)    Radiological Exams on Admission: DG Femur Min 2 Views Right  Result Date: 09/23/2023 CLINICAL DATA:  Pre right hip fracture surgery evaluation. EXAM: RIGHT FEMUR 2 VIEWS COMPARISON:  Pelvis and right hip radiographs obtained earlier today. FINDINGS: The previously demonstrated comminuted intertrochanteric fracture on the right is not included on the current images. The remainder of the femur is unremarkable with no additional fractures seen. Mild patellofemoral and medial joint space degenerative changes. Diffuse arterial calcifications. IMPRESSION: 1. The previously demonstrated comminuted intertrochanteric fracture on the right is not included on the current images. 2. No additional fractures. Electronically Signed   By: Beckie Salts M.D.   On: 09/23/2023 19:42   DG Hip Unilat W or Wo Pelvis 2-3 Views Right  Result Date: 09/23/2023 CLINICAL DATA:  Fall today with right hip pain. Shortening and rotation. EXAM: DG HIP (WITH OR WITHOUT PELVIS) 2-3V RIGHT COMPARISON:  None Available. FINDINGS: Comminuted and displaced intertrochanteric right femur fracture. There is mild proximal migration of the femoral shaft. Apex lateral angulation. Femoral head is seated in the acetabulum, no hip dislocation. No pubic rami fracture. Pubic symphysis and sacroiliac joints are congruent. Left hip  arthroplasty is intact were visualized. There are prominent vascular calcifications IMPRESSION: Comminuted and displaced intertrochanteric right femur fracture. Electronically Signed   By: Narda Rutherford M.D.   On: 09/23/2023 19:06   DG Chest 1 View  Result Date: 09/23/2023 CLINICAL DATA:  Unwitnessed fall today. Initial encounter. Hip pain. EXAM: CHEST  1 VIEW COMPARISON:  Chest radiograph 11/18/2020 FINDINGS: Low lung volumes. Upper normal heart size. Post TAVR. Elevation of the right hemidiaphragm. No pneumothorax, large pleural effusion or focal airspace opacity. Bronchovascular crowding versus vascular congestion. IMPRESSION: Low lung volumes with bronchovascular crowding versus vascular congestion. Electronically Signed   By: Narda Rutherford M.D.   On: 09/23/2023 19:04     Data Reviewed: Relevant notes from primary care and specialist visits, past discharge summaries as available in EHR, including Care Everywhere. Prior diagnostic testing as pertinent to current admission diagnoses Updated medications and problem lists for reconciliation ED course, including vitals, labs, imaging, treatment and response to treatment Triage notes, nursing and pharmacy notes and ED provider's notes Notable results as noted in HPI  Assessment and Plan: * Fall at home, initial encounter Pt fell and toppled over while bending over to pick a refrigerator magnet of the floor.  Patient fell on her right side.  Patient denies any dizziness recently or off and on.  Fall precautions.   Displaced intertrochanteric fracture of right femur, initial encounter for closed fracture [facility]) Patient sustained a displaced right intertrochanteric femur fracture after a fall at home today.  Greatly appreciate orthopedic consultation in the emergency room patient was seen urgently with plans for surgical intervention.  Additional measures and recommendations as far as  mobility and pain control per orthopedic and physical  therapy.  Plan is for n.p.o. after midnight along with anticoagulation held after midnight.   Chest pain Discussed with daughter Maralyn Sago at bedside and son at bedside about patient having chest discomfort off and on for the past few weeks.  Patient states she had chest pain last earlier today.  Discussed with family that with patient's history of right leg DVT after her hip surgery patient is at high risk for PE.  Discussed with pharmacy about anticoagulation with heparin gtt. and recommended that as patient is not supposed to have anticoagulation after midnight we can ideally do a single dose of heparin for tonight until surgery.  Agreed with pharmacy and we will obtain a VQ scan in the meantime prior to surgery as well. 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  Repeat EKG today shows sinus rhythm 94 with a PR of 114 left atrial enlargement, old anterior infarct, and a borderline repolarization abnormality.  On comparison to her previous EKG from 2021 November patient has significant Q waves consistent with anteroseptal infarct as well.  However will get cardiology consult from [facility] clinic as pt is seen by Dr. Darrold Junker in the past for her aortic stenosis .Pt will benefit from cardiac eval and will place consult.    Essential hypertension Vitals:   09/23/23 1700 09/23/23 1745 09/23/23 1800 09/23/23 1830  BP: (!) 166/81 (!) 164/72 (!) 166/83 (!) 160/87   09/23/23 1930 09/23/23 2000 09/23/23 2030 09/23/23 2100  BP: (!) 158/82 (!) 159/80 (!) 163/90 (!) 157/74   09/23/23 2130 09/23/23 2200 09/23/23 2300 09/24/23 0100  BP: (!) 124/55 120/66 138/66 (!) 147/76  We have continued patient on amlodipine, carvedilol and as needed hydralazine.    CKD stage 4 secondary to hypertension [facility]) Lab  Results  Component Value Date   CREATININE 1.59 (H) 09/23/2023   CREATININE 1.46 (H) 03/06/2023   CREATININE 1.45 (H) 03/02/2021  Creatinine is stable.  Will renally dose medications that may be needed in addition to her baseline medications and also avoid contrast therapy.    Anemia of chronic disease    Latest Ref Rng & Units 09/23/2023    4:43 PM 03/06/2023    1:34 PM 09/05/2022    1:30 PM  CBC  WBC 4.0 - 10.5 K/uL 20.2  16.7  19.1   Hemoglobin 12.0 - 15.0 g/dL 16.1  09.6  04.5   Hematocrit 36.0 - 46.0 % 44.9  45.8  47.4   Platelets 150 - 400 K/uL 214  236  260   Anemia has resolved and patient is on iron supplementation.   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   On long term prednisone therapy Secondary to long-term prednisone therapy patient is at high risk for fractures has a history of osteoporosis encouraged  to follow-up with endocrinology for management of her osteoporosis as patient is at high risk for fractures.  Daughter and patient verbalized understanding.  Patient was taking something in the past however had a issue with calcium and had discontinued the medication they do not recall the name of it.  Aortic stenosis, mild Audible murmur.  We will get echo and cardiology consult.    Prognosis: Guarded but stable.   DVT prophylaxis:  Heparin single dose.   Consults:  Orthopedic: Dr.Aberman. Cardiology: Dr. Corky Sing.   Advance Care Planning:    Code Status: Full Code   Family Communication:  Fara Boros daughter  Disposition Plan:  TBD   Severity of Illness: The appropriate patient status for this patient is INPATIENT. Inpatient status is  judged to be reasonable and necessary in order to provide the required intensity of service to ensure the patient's safety. The patient's presenting symptoms, physical exam findings, and initial radiographic and laboratory data in the context of their chronic comorbidities is felt to place them at high risk for further clinical deterioration. Furthermore, it is not anticipated that the patient will be medically stable for discharge from the hospital within 2 midnights of admission.   * I certify that at the point of admission it is my clinical judgment that the patient will require inpatient hospital care spanning beyond 2 midnights from the point of admission due to high intensity of service, high risk for further deterioration and high frequency of surveillance required.*  Author: Gertha Calkin, MD 09/24/2023 3:34 AM  For on call review [email or web address].

## 2023-09-23 NOTE — ED Notes (Signed)
Gerilyn Pilgrim RN transporting pt to admission holding unit in ED

## 2023-09-23 NOTE — ED Notes (Signed)
Pt family expressing concerns that pt is in pain. Pt reports pain is "much better" 3/10.

## 2023-09-23 NOTE — H&P (View-Only) (Signed)
ORTHOPAEDIC CONSULTATION  REQUESTING PHYSICIAN: Sharyn Creamer, MD  Chief Complaint:   Right intertrochanteric femur fracture  History of Present Illness: Linda Bradley is a 82 y.o. female presents to the emergency room after a mechanical unwitnessed fall today when try to get something out of her refrigerator.  Patient denies head strike or loss of consciousness.  She reports she fell onto her right side due to right hip pain.  She reports a history of a left hip fracture in the past which was treated with a hemiarthroplasty.  She reports she did well after that fracture and is back to baseline activities using a rolling walker for ambulation.  She denies any pre-existing right hip pain prior to her fall.  She does report that she did have a small skin tear on the right distal humeral area which is currently dressed.  Patient does report she had some intermittent chest pain since Saturday does not have any chest pain at this time.  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  She has had blood clots in the past and a GI bleed after being placed on Xarelto per family.  We did discuss that we will need to use DVT prophylaxis after her surgery and we will have to weigh the risk of bleeding over the risk of blood clot.  Patient denies any chest pain at this time, shortness of breath, fever, chills, current illness, nausea or vomiting.  Denies any numbness and tingling to her lower extremity.  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   Heart murmur    Hypercholesterolemia    Hypertension    Lymphadenitis    Renal disorder    Skin cancer    Spontaneous ecchymoses    Vitamin D deficiency    Past Surgical History:  Procedure Laterality Date   CARPAL TUNNEL RELEASE     [facility]   CATARACT EXTRACTION W/PHACO Left 01/21/2022    Procedure: CATARACT EXTRACTION PHACO AND INTRAOCULAR LENS PLACEMENT (IOC) LEFT;  Surgeon: Nevada Crane, MD;  Location: [facility];  Service: Ophthalmology;  Laterality: Left;  12.93 1:12.8   CATARACT EXTRACTION W/PHACO Right 02/04/2022   Procedure: CATARACT EXTRACTION PHACO AND INTRAOCULAR LENS PLACEMENT (IOC) RIGHT 8.34 00:50.9;  Surgeon: Nevada Crane, MD;  Location: [facility];  Service: Ophthalmology;  Laterality: Right;   ESOPHAGOGASTRODUODENOSCOPY (EGD) WITH PROPOFOL N/A 04/14/2020   Procedure: ESOPHAGOGASTRODUODENOSCOPY (EGD) WITH PROPOFOL;  Surgeon: Midge Minium, MD;  Location: [facility];  Service: Endoscopy;  Laterality: N/A;   HIP ARTHROPLASTY Left 11/18/2020   Procedure: ARTHROPLASTY BIPOLAR HIP (HEMIARTHROPLASTY);  Surgeon: Christena Flake, MD;  Location: [facility] ORS;  Service: Orthopedics;  Laterality: Left;   TONSILLECTOMY     TRANSTHORACIC ECHOCARDIOGRAM  05/01/2015    EF 60-65%. Abnormal relaxation. Mild aortic stenosis (peak gradient 19 mmHg).   Social History   Socioeconomic History   Marital status: Widowed    Spouse name: Not on file   Number of children: Not on file   Years of education: Not on file   Highest education level: Not on file  Occupational History   Not on file  Tobacco Use   Smoking status: Never   Smokeless tobacco: Never  Vaping Use   Vaping status: Never Used  Substance and Sexual Activity   Alcohol use: No   Drug use: No   Sexual activity: Not  on file  Other Topics Concern   Not on file  Social History Narrative   Not on file   Social Determinants of Health   Financial Resource Strain: Medium Risk (06/09/2023)   Received from [facility]   Overall Financial Resource Strain (CARDIA)    Difficulty of Paying Living Expenses: Somewhat hard  Food Insecurity: No Food Insecurity (06/09/2023)   Received from [facility]   Hunger Vital Sign    Worried About Running Out of Food in the  Last Year: Never true    Ran Out of Food in the Last Year: Never true  Transportation Needs: No Transportation Needs (06/09/2023)   Received from [facility] - Transportation    In the past 12 months, has lack of transportation kept you from medical appointments or from getting medications?: No    Lack of Transportation (Non-Medical): No  Physical Activity: Not on file  Stress: Not on file  Social Connections: Not on file   Family History  Problem Relation Age of Onset   Heart disease Father    Heart failure Father    Allergies  Allergen Reactions   Influenza Vaccines Anaphylaxis   Coreg [Carvedilol] Nausea Only and Other (See Comments)    Syncope   Statins Other (See Comments)    Other Reaction: CNS DISORDER   Chlorthalidone Diarrhea   Prior to Admission medications   Medication Sig Start Date End Date Taking? Authorizing Provider  amLODipine (NORVASC) 5 MG tablet Take 5 mg by mouth daily. 08/29/20   [provider]  aspirin 81 MG chewable tablet Chew by mouth. 09/04/21   [provider]  atenolol (TENORMIN) 25 MG tablet Take 0.5 tablets (12.5 mg total) by mouth daily. 11/24/20   Lurene Shadow, MD  Calcium Carb-Cholecalciferol (CALCIUM HIGH POTENCY/VITAMIN D) 600-5 MG-MCG TABS Take by mouth.    [provider]  CALCIUM-VITAMIN D PO Take by mouth daily.    [provider]  clonazePAM (KLONOPIN) 0.5 MG tablet Take 1 tablet (0.5 mg total) by mouth 2 (two) times daily as needed for anxiety. 11/24/20   Lurene Shadow, MD  cloNIDine (CATAPRES) 0.2 MG tablet Take 0.2 mg by mouth 2 (two) times daily. 02/08/21   [provider]  Ferrous Sulfate (SLOW FE) 142 (45 Fe) MG TBCR Take 45 mg of iron by mouth daily.    [provider]  furosemide (LASIX) 20 MG tablet Take by mouth. 12/04/21   [provider]  losartan (COZAAR) 100 MG tablet Take 100 mg by mouth daily. 12/09/20   [provider]   predniSONE (DELTASONE) 10 MG tablet Take 1 tablet (10 mg total) by mouth daily. 07/17/20   Cuthriell, Delorise Royals, PA-C  vitamin B-12 1000 MCG tablet Take 1 tablet (1,000 mcg total) by mouth daily. 03/28/20   Enedina Finner, MD   No results found.  Positive ROS: All other systems have been reviewed and were otherwise negative with the exception of those mentioned in the HPI and as above.  Physical Exam: General:  Alert, no acute distress Psychiatric:  Patient is competent for consent with normal mood and affect   Cardiovascular:  No pedal edema Respiratory:  No wheezing, non-labored breathing GI:  Abdomen is soft and non-tender Skin:  No lesions in the area of chief complaint Neurologic:  Sensation intact distally Lymphatic:  No axillary or cervical lymphadenopathy  Orthopedic Exam:  Right lower extremity  shortened and externally rotated And intact  over the hip with tenderness palpation mild swelling over the hip No tenderness palpation over the distal femur, knee, tibia, ankle, foot or toes Able to dorsiflex and plantarflex the foot and toes sensation intact to the foot intact dorsalis pedis pulse  Secondary survey No tenderness to palpation over other bony prominences in the lower extremities or bilateral upper extremities well-dressed skin tear over the right distal humeral area no active bleeding No pain with logroll or simulated axial loading of the left lower extremity All compartments soft No tenderness to palpation over the cervical or thoracic spine, no bony step-off Motor grossly intact throughout, no focal deficits Sensation grossly intact throughout, no focal deficits Good distal pulses and capillary refill on all extremities  X-rays:  X-rays reviewed myself which show a displaced intertrochanteric right femur fracture.  No evidence of any distal femur fractures noted.  Assessment: Right intertrochanteric femur fracture  Plan: Cleda is an 82 year old female presents  with a displaced right intertrochanteric femur fracture.  I had a conversation with the patient and her children about the surgical versus nonsurgical treatment options for this type of fracture I discussed the risks and benefits of each.  Under shared decision making model the patient has elected to move forward with surgical intervention for her right hip fracture.  The patient will need a intramedullary nail.  A long discussion took place with the patient describing what a intra [place] nail is and what the procedure would entail. The xrays were reviewed with the patient and the implants were discussed. The ability to secure the implant utilizing screw blade fixation was discussed. Surgical exposures were discussed with the patient.    The hospitalization and post-operative care and rehabilitation were also discussed. The use of perioperative antibiotics and DVT prophylaxis were discussed.  We did specifically rotation about her risk of bleeding versus clotting with her history of DVTs and GI bleeding.  the risk, benefits and alternatives to a surgical intervention were discussed at length with the patient. The patient was also advised of risks related to the medical comorbidities. A lengthy discussion took place to review the most common complications including but not limited to: deep vein thrombosis, pulmonary embolus, heart attack, stroke, infection, wound breakdown, dislocation, numbness, leg length in-equality, damage to nerves, intraoperative fracture, malunion/ nonunion, implant cut out, screw breakage, implant breakage, tendon,muscles, arteries or other blood vessels, death and other possible complications from anesthesia. The patient was told that we will take steps to minimize these risks by using sterile technique, antibiotics and DVT prophylaxis when appropriate and follow the patient postoperatively in the office setting to monitor progress. The possibility of recurrent pain, no improvement in pain  and actual worsening of pain were also discussed with the patient.      The benefits of surgery were discussed with the patient including the potential for improving the patient's current clinical condition through operative intervention. Alternatives to surgical intervention including conservative management were also discussed in detail. All questions were answered to the satisfaction of the patient. The patient participated and agreed to the plan of care as well as the use of the recommended implants for their surgery.    Plan for surgery tomorrow 10-12-23 N.p.o. after midnight for the operating room Hold anticoagulation after midnight\ Will complete full consent forms in the preoperative holding area prior to surgery     Reinaldo Berber MD  Beeper #:  [phone]  09/23/2023 6:14 PM

## 2023-09-23 NOTE — Progress Notes (Signed)
ANTICOAGULATION CONSULT NOTE  Pharmacy Consult for heparin infusion Indication: pulmonary embolus  Allergies  Allergen Reactions   Influenza Vaccines Anaphylaxis   Coreg [Carvedilol] Nausea Only and Other (See Comments)    Syncope   Statins Other (See Comments)    Other Reaction: CNS DISORDER   Chlorthalidone Diarrhea    Patient Measurements: Height: 4\' 11"  (149.9 cm) Weight: 68 kg (150 lb) IBW/kg (Calculated) : 43.2 Heparin Dosing Weight: 58.2 kg  Vital Signs: Temp: 97.7 F (36.5 C) (10/01 1637) BP: 120/66 (10/01 2200) Pulse Rate: 76 (10/01 2200)  Labs: Recent Labs    09/23/23 1643  HGB 14.8  HCT 44.9  PLT 214  CREATININE 1.59*  CKTOTAL <5*  TROPONINIHS 11    Estimated Creatinine Clearance: 23.7 mL/min (A) (by C-G formula based on SCr of 1.59 mg/dL (H)).   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 Pt c/o intermittent CP since Saturday giving concern for possible PE.  No CT imagining currently available.  Per Sx, no anticoags after 0000.  Goal of Therapy:  Heparin level 0.3-0.7 units/ml Monitor platelets by anticoagulation protocol: Yes   Plan:  After discussing with MD, will bolus 5000 units x 1 prior to midnight Pharmacy to F/U regarding  in AM pre/post procedure as appropriate  Otelia Sergeant, PharmD, [facility] 09/23/2023 10:44 PM

## 2023-09-23 NOTE — ED Notes (Signed)
Pt requesting additional pain meds, Dr Fanny Bien notified

## 2023-09-24 ENCOUNTER — Encounter
Admission: EM | Disposition: A | Discharge status: Skilled Nursing Facility | Payer: Self-pay | Source: Home / Self Care | Attending: Internal Medicine

## 2023-09-24 ENCOUNTER — Inpatient Hospital Stay: Payer: Medicare HMO | Admitting: Anesthesiology

## 2023-09-24 ENCOUNTER — Other Ambulatory Visit: Payer: Self-pay

## 2023-09-24 ENCOUNTER — Encounter: Payer: Self-pay | Admitting: Internal Medicine

## 2023-09-24 ENCOUNTER — Other Ambulatory Visit: Payer: Medicare HMO

## 2023-09-24 ENCOUNTER — Inpatient Hospital Stay: Payer: Medicare HMO

## 2023-09-24 DIAGNOSIS — S7291XA Unspecified fracture of right femur, initial encounter for closed fracture: Secondary | ICD-10-CM

## 2023-09-24 DIAGNOSIS — R079 Chest pain, unspecified: Secondary | ICD-10-CM | POA: Diagnosis present

## 2023-09-24 HISTORY — PX: INTRAMEDULLARY (IM) NAIL INTERTROCHANTERIC: SHX5875

## 2023-09-24 LAB — CBC
HCT: 39.8 % (ref 36.0–46.0)
Hemoglobin: 13.4 g/dL (ref 12.0–15.0)
MCH: 28.6 pg (ref 26.0–34.0)
MCHC: 33.7 g/dL (ref 30.0–36.0)
MCV: 84.9 fL (ref 80.0–100.0)
Platelets: 188 10*3/uL (ref 150–400)
RBC: 4.69 MIL/uL (ref 3.87–5.11)
RDW: 13 % (ref 11.5–15.5)
WBC: 22.7 10*3/uL — ABNORMAL HIGH (ref 4.0–10.5)
nRBC: 0 % (ref 0.0–0.2)

## 2023-09-24 LAB — BASIC METABOLIC PANEL
Anion gap: 11 (ref 5–15)
BUN: 40 mg/dL — ABNORMAL HIGH (ref 8–23)
CO2: 25 mmol/L (ref 22–32)
Calcium: 8.7 mg/dL — ABNORMAL LOW (ref 8.9–10.3)
Chloride: 101 mmol/L (ref 98–111)
Creatinine, Ser: 1.22 mg/dL — ABNORMAL HIGH (ref 0.44–1.00)
GFR, Estimated: 45 mL/min — ABNORMAL LOW (ref >60)
Glucose, Bld: 166 mg/dL — ABNORMAL HIGH (ref 70–99)
Potassium: 3.3 mmol/L — ABNORMAL LOW (ref 3.5–5.1)
Sodium: 137 mmol/L (ref 135–145)

## 2023-09-24 LAB — HEPATIC FUNCTION PANEL
ALT: 31 U/L (ref 0–44)
AST: 17 U/L (ref 15–41)
Albumin: 3.2 g/dL — ABNORMAL LOW (ref 3.5–5.0)
Alkaline Phosphatase: 91 U/L (ref 38–126)
Bilirubin, Direct: 0.1 mg/dL (ref 0.0–0.2)
Indirect Bilirubin: 1 mg/dL — ABNORMAL HIGH (ref 0.3–0.9)
Total Bilirubin: 1.1 mg/dL (ref 0.3–1.2)
Total Protein: 5.9 g/dL — ABNORMAL LOW (ref 6.5–8.1)

## 2023-09-24 SURGERY — FIXATION, FRACTURE, INTERTROCHANTERIC, WITH INTRAMEDULLARY ROD
Anesthesia: General | Laterality: Right

## 2023-09-24 MED ORDER — MENTHOL 3 MG MT LOZG: 1.0000 | LOZENGE | OROMUCOSAL | Status: DC | PRN

## 2023-09-24 MED ORDER — FENTANYL CITRATE (PF) 100 MCG/2ML IJ SOLN
INTRAMUSCULAR | Status: DC | PRN
  Administered 2023-09-24: 50 ug via INTRAVENOUS

## 2023-09-24 MED ORDER — ENSURE ENLIVE PO LIQD
237.0000 mL | Freq: Two times a day (BID) | ORAL | Status: DC
  Administered 2023-09-25 – 2023-09-29 (×9): 237 mL via ORAL

## 2023-09-24 MED ORDER — PHENYLEPHRINE HCL-NACL 20-0.9 MG/250ML-% IV SOLN
INTRAVENOUS | Status: AC
  Filled 2023-09-24: qty 250

## 2023-09-24 MED ORDER — CEFAZOLIN SODIUM-DEXTROSE 2-4 GM/100ML-% IV SOLN
2.0000 g | Freq: Four times a day (QID) | INTRAVENOUS | Status: AC
  Administered 2023-09-24 – 2023-09-25 (×2): 2 g via INTRAVENOUS
  Filled 2023-09-24 (×2): qty 100

## 2023-09-24 MED ORDER — ADULT MULTIVITAMIN W/MINERALS CH
1.0000 | ORAL_TABLET | Freq: Every day | ORAL | Status: DC
  Filled 2023-09-24 (×3): qty 1

## 2023-09-24 MED ORDER — CEFAZOLIN SODIUM-DEXTROSE 2-4 GM/100ML-% IV SOLN
INTRAVENOUS | Status: AC
  Filled 2023-09-24: qty 100

## 2023-09-24 MED ORDER — FENTANYL CITRATE (PF) 100 MCG/2ML IJ SOLN
INTRAMUSCULAR | Status: AC
  Filled 2023-09-24: qty 2

## 2023-09-24 MED ORDER — METOCLOPRAMIDE HCL 10 MG PO TABS: 5.0000 mg | ORAL_TABLET | Freq: Three times a day (TID) | ORAL | Status: DC | PRN

## 2023-09-24 MED ORDER — PROPOFOL 10 MG/ML IV BOLUS
INTRAVENOUS | Status: AC
  Filled 2023-09-24: qty 20

## 2023-09-24 MED ORDER — OXYCODONE HCL 5 MG PO TABS: 5.0000 mg | ORAL_TABLET | Freq: Once | ORAL | Status: DC | PRN

## 2023-09-24 MED ORDER — MORPHINE SULFATE (PF) 2 MG/ML IV SOLN: 0.5000 mg | INTRAVENOUS | Status: DC | PRN

## 2023-09-24 MED ORDER — DEXAMETHASONE SODIUM PHOSPHATE 10 MG/ML IJ SOLN
INTRAMUSCULAR | Status: DC | PRN
  Administered 2023-09-24: 10 mg via INTRAVENOUS

## 2023-09-24 MED ORDER — OXYCODONE HCL 5 MG/5ML PO SOLN: 5.0000 mg | Freq: Once | ORAL | Status: DC | PRN

## 2023-09-24 MED ORDER — ROCURONIUM BROMIDE 100 MG/10ML IV SOLN
INTRAVENOUS | Status: DC | PRN
  Administered 2023-09-24: 50 mg via INTRAVENOUS

## 2023-09-24 MED ORDER — TRANEXAMIC ACID-NACL 1000-0.7 MG/100ML-% IV SOLN
1000.0000 mg | INTRAVENOUS | Status: AC
  Administered 2023-09-24 (×2): 1000 mg via INTRAVENOUS

## 2023-09-24 MED ORDER — DOCUSATE SODIUM 100 MG PO CAPS: 100.0000 mg | ORAL_CAPSULE | Freq: Two times a day (BID) | ORAL | Status: DC

## 2023-09-24 MED ORDER — LIDOCAINE HCL (CARDIAC) PF 100 MG/5ML IV SOSY
PREFILLED_SYRINGE | INTRAVENOUS | Status: DC | PRN
  Administered 2023-09-24: 60 mg via INTRAVENOUS

## 2023-09-24 MED ORDER — SUGAMMADEX SODIUM 200 MG/2ML IV SOLN
INTRAVENOUS | Status: DC | PRN
  Administered 2023-09-24: 150 mg via INTRAVENOUS

## 2023-09-24 MED ORDER — PHENOL 1.4 % MT LIQD: 1.0000 | OROMUCOSAL | Status: DC | PRN

## 2023-09-24 MED ORDER — HYDROCODONE-ACETAMINOPHEN 7.5-325 MG PO TABS
1.0000 | ORAL_TABLET | ORAL | Status: DC | PRN
  Administered 2023-09-25: 2 via ORAL
  Filled 2023-09-24 (×2): qty 2

## 2023-09-24 MED ORDER — ONDANSETRON HCL 4 MG/2ML IJ SOLN
INTRAMUSCULAR | Status: DC | PRN
  Administered 2023-09-24: 4 mg via INTRAVENOUS

## 2023-09-24 MED ORDER — ONDANSETRON HCL 4 MG/2ML IJ SOLN: 4.0000 mg | Freq: Four times a day (QID) | INTRAMUSCULAR | Status: DC | PRN

## 2023-09-24 MED ORDER — METOCLOPRAMIDE HCL 5 MG/ML IJ SOLN: 5.0000 mg | Freq: Three times a day (TID) | INTRAMUSCULAR | Status: DC | PRN

## 2023-09-24 MED ORDER — PROPOFOL 10 MG/ML IV BOLUS
INTRAVENOUS | Status: AC
  Filled 2023-09-24: qty 40

## 2023-09-24 MED ORDER — TRANEXAMIC ACID-NACL 1000-0.7 MG/100ML-% IV SOLN
INTRAVENOUS | Status: AC
  Filled 2023-09-24: qty 100

## 2023-09-24 MED ORDER — PHENYLEPHRINE HCL-NACL 20-0.9 MG/250ML-% IV SOLN
INTRAVENOUS | Status: DC | PRN
Start: 2023-09-24 — End: 2023-09-24
  Administered 2023-09-24: 15 ug/min via INTRAVENOUS

## 2023-09-24 MED ORDER — LIDOCAINE HCL (PF) 2 % IJ SOLN
INTRAMUSCULAR | Status: AC
  Filled 2023-09-24: qty 5

## 2023-09-24 MED ORDER — PROPOFOL 10 MG/ML IV BOLUS
INTRAVENOUS | Status: DC | PRN
  Administered 2023-09-24: 120 mg via INTRAVENOUS

## 2023-09-24 MED ORDER — 0.9 % SODIUM CHLORIDE (POUR BTL) OPTIME
TOPICAL | Status: DC | PRN
  Administered 2023-09-24: 325 mL

## 2023-09-24 MED ORDER — FENTANYL CITRATE (PF) 100 MCG/2ML IJ SOLN: 25.0000 ug | INTRAMUSCULAR | Status: DC | PRN

## 2023-09-24 MED ORDER — ACETAMINOPHEN 325 MG PO TABS
325.0000 mg | ORAL_TABLET | Freq: Four times a day (QID) | ORAL | Status: DC | PRN
  Administered 2023-09-27: 650 mg via ORAL
  Filled 2023-09-24: qty 2

## 2023-09-24 MED ORDER — ACETAMINOPHEN 500 MG PO TABS: 500.0000 mg | ORAL_TABLET | Freq: Four times a day (QID) | ORAL | Status: DC

## 2023-09-24 MED ORDER — ENOXAPARIN SODIUM 80 MG/0.8ML IJ SOSY
65.0000 mg | PREFILLED_SYRINGE | Freq: Once | INTRAMUSCULAR | Status: AC
  Administered 2023-09-25: 65 mg via SUBCUTANEOUS
  Filled 2023-09-24: qty 0.8
  Filled 2023-09-24: qty 0.65

## 2023-09-24 MED ORDER — BUPIVACAINE-EPINEPHRINE (PF) 0.25% -1:200000 IJ SOLN
INTRAMUSCULAR | Status: AC
  Filled 2023-09-24: qty 30

## 2023-09-24 MED ORDER — ONDANSETRON HCL 4 MG PO TABS: 4.0000 mg | ORAL_TABLET | Freq: Four times a day (QID) | ORAL | Status: DC | PRN

## 2023-09-24 MED ORDER — HYDROCODONE-ACETAMINOPHEN 5-325 MG PO TABS
1.0000 | ORAL_TABLET | ORAL | Status: DC | PRN
  Administered 2023-09-26 – 2023-09-27 (×3): 1 via ORAL
  Administered 2023-09-27: 2 via ORAL
  Administered 2023-09-27 – 2023-09-28 (×2): 1 via ORAL
  Administered 2023-09-28 – 2023-09-29 (×2): 2 via ORAL
  Administered 2023-09-29: 1 via ORAL
  Filled 2023-09-24 (×2): qty 1
  Filled 2023-09-24 (×3): qty 2
  Filled 2023-09-24 (×2): qty 1
  Filled 2023-09-24: qty 2
  Filled 2023-09-24: qty 1

## 2023-09-24 MED ORDER — PHENYLEPHRINE 80 MCG/ML (10ML) SYRINGE FOR IV PUSH (FOR BLOOD PRESSURE SUPPORT)
PREFILLED_SYRINGE | INTRAVENOUS | Status: DC | PRN
  Administered 2023-09-24: 160 ug via INTRAVENOUS

## 2023-09-24 MED ORDER — ROCURONIUM BROMIDE 10 MG/ML (PF) SYRINGE
PREFILLED_SYRINGE | INTRAVENOUS | Status: AC
  Filled 2023-09-24: qty 10

## 2023-09-24 MED ORDER — CEFAZOLIN SODIUM-DEXTROSE 2-4 GM/100ML-% IV SOLN
2.0000 g | Freq: Once | INTRAVENOUS | Status: AC
  Administered 2023-09-24: 2 g via INTRAVENOUS

## 2023-09-24 MED ORDER — ONDANSETRON HCL 4 MG/2ML IJ SOLN: 4.0000 mg | Freq: Once | INTRAMUSCULAR | Status: DC | PRN

## 2023-09-24 MED ORDER — BUPIVACAINE-EPINEPHRINE (PF) 0.25% -1:200000 IJ SOLN
INTRAMUSCULAR | Status: DC | PRN
  Administered 2023-09-24: 30 mL

## 2023-09-24 SURGICAL SUPPLY — 54 items
ADH SKN CLS APL DERMABOND .7 (GAUZE/BANDAGES/DRESSINGS) ×1
APL PRP STRL LF DISP 70% ISPRP (MISCELLANEOUS) ×1
BIT DRILL CANN 16 HIP (BIT) IMPLANT
BIT DRILL CANN STP 6/9 HIP (BIT) IMPLANT
BIT DRILL LONG 4.2 (BIT) IMPLANT
BIT DRILL TAPERED 10 (BIT) IMPLANT
BLADE HELICAL TFNA 90 (Anchor) IMPLANT
BNDG CMPR 5X6 CHSV STRCH STRL (GAUZE/BANDAGES/DRESSINGS) ×2
BNDG COHESIVE 6X5 TAN ST LF (GAUZE/BANDAGES/DRESSINGS) ×2 IMPLANT
CHLORAPREP W/TINT 26 (MISCELLANEOUS) ×1 IMPLANT
DERMABOND ADVANCED .7 DNX12 (GAUZE/BANDAGES/DRESSINGS) ×1 IMPLANT
DRAPE C-ARM XRAY 36X54 (DRAPES) ×1 IMPLANT
DRAPE C-ARMOR (DRAPES) IMPLANT
DRAPE SHEET LG 3/4 BI-LAMINATE (DRAPES) ×1 IMPLANT
DRSG OPSITE POSTOP 3X4 (GAUZE/BANDAGES/DRESSINGS) IMPLANT
DRSG OPSITE POSTOP 4X6 (GAUZE/BANDAGES/DRESSINGS) IMPLANT
ELECT CAUTERY BLADE 6.4 (BLADE) IMPLANT
ELECT REM PT RETURN 9FT ADLT (ELECTROSURGICAL)
ELECTRODE REM PT RTRN 9FT ADLT (ELECTROSURGICAL) IMPLANT
GLOVE PI ORTHO PRO STRL 7.5 (GLOVE) ×2 IMPLANT
GLOVE SURG SYN 7.5 E (GLOVE) ×1 IMPLANT
GLOVE SURG SYN 7.5 PF PI (GLOVE) ×1 IMPLANT
GOWN SRG XL LVL 3 NONREINFORCE (GOWNS) ×1 IMPLANT
GOWN STRL NON-REIN TWL XL LVL3 (GOWNS) ×1
GOWN STRL REUS W/ TWL LRG LVL3 (GOWN DISPOSABLE) ×1 IMPLANT
GOWN STRL REUS W/TWL LRG LVL3 (GOWN DISPOSABLE) ×1
GUIDEWIRE 3.2X400 (WIRE) IMPLANT
HANDLE YANKAUER SUCT OPEN TIP (MISCELLANEOUS) ×1 IMPLANT
KIT PATIENT CARE HANA TABLE (KITS) ×1 IMPLANT
KIT TURNOVER CYSTO (KITS) ×1 IMPLANT
MANIFOLD NEPTUNE II (INSTRUMENTS) ×1 IMPLANT
MAT ABSORB FLUID 56X50 GRAY (MISCELLANEOUS) ×1 IMPLANT
NAIL TROCH FIX 10X235 RT 130 (Nail) IMPLANT
NDL FILTER BLUNT 18X1 1/2 (NEEDLE) ×1 IMPLANT
NDL HYPO 21X1.5 SAFETY (NEEDLE) ×1 IMPLANT
NDL SAFETY ECLIP 18X1.5 (MISCELLANEOUS) ×1 IMPLANT
NEEDLE FILTER BLUNT 18X1 1/2 (NEEDLE) ×1 IMPLANT
NEEDLE HYPO 21X1.5 SAFETY (NEEDLE) ×1 IMPLANT
NS IRRIG 500ML POUR BTL (IV SOLUTION) ×1 IMPLANT
PACK HIP COMPR (MISCELLANEOUS) ×1 IMPLANT
PAD ARMBOARD 7.5X6 YLW CONV (MISCELLANEOUS) ×1 IMPLANT
SCREW LOCK STAR 5X34 (Screw) IMPLANT
SLEEVE SCD COMPRESS KNEE MED (STOCKING) ×1 IMPLANT
SUT QUILL MONODERM 3-0 PS-2 (SUTURE) ×1 IMPLANT
SUT STRATA 1 CT-1 DLB (SUTURE) ×1
SUT VIC AB 1 CT1 36 (SUTURE) ×1 IMPLANT
SUT VIC AB 2-0 CT2 27 (SUTURE) ×1 IMPLANT
SUTURE STRATA SPIR 4-0 18 (SUTURE) IMPLANT
SYR 30ML LL (SYRINGE) ×1 IMPLANT
SYR TB 1ML LL NO SAFETY (SYRINGE) ×1 IMPLANT
Stratafix 4-0 IMPLANT
TAPE MICROFOAM 4IN (TAPE) ×1 IMPLANT
TRAP FLUID SMOKE EVACUATOR (MISCELLANEOUS) IMPLANT
WATER STERILE IRR 1000ML POUR (IV SOLUTION) ×1 IMPLANT

## 2023-09-24 NOTE — Transfer of Care (Signed)
Immediate Anesthesia Transfer of Care Note  Patient: Linda Bradley  Procedure(s) Performed: INTRAMEDULLARY (IM) NAIL INTERTROCHANTERIC (Right)  Patient Location: PACU  Anesthesia Type:General  Level of Consciousness: awake  Airway & Oxygen Therapy: Patient Spontanous Breathing and Patient connected to face mask oxygen  Post-op Assessment: Report given to RN and Post -op Vital signs reviewed and stable  Post vital signs: Reviewed and stable  Last Vitals:  Vitals Value Taken Time  BP 125/59 09/24/23 1709  Temp    Pulse 64 09/24/23 1711  Resp 16 09/24/23 1711  SpO2 100 % 09/24/23 1711  Vitals shown include unfiled device data.  Last Pain:  Vitals:   09/24/23 1409  TempSrc:   PainSc: 0-No pain         Complications: No notable events documented.

## 2023-09-24 NOTE — Progress Notes (Signed)
Patient noted with large skin tear to upper arm. Old dressing removed and site cleaned. Placed non-adherent strips, non-adherent pad and wrapped with kerlix.

## 2023-09-24 NOTE — Assessment & Plan Note (Signed)
    Latest Ref Rng & Units 09/23/2023    4:43 PM 03/06/2023    1:34 PM 09/05/2022    1:30 PM  CBC  WBC 4.0 - 10.5 K/uL 20.2  16.7  19.1   Hemoglobin 12.0 - 15.0 g/dL 16.1  09.6  04.5   Hematocrit 36.0 - 46.0 % 44.9  45.8  47.4   Platelets 150 - 400 K/uL 214  236  260   Anemia has resolved and patient is on iron supplementation.

## 2023-09-24 NOTE — Anesthesia Preprocedure Evaluation (Addendum)
Anesthesia Evaluation  Patient identified by MRN, date of birth, ID band Patient awake    Reviewed: Allergy & Precautions, H&P , NPO status , Patient's Chart, lab work & pertinent test results  History of Anesthesia Complications Negative for: history of anesthetic complications  Airway Mallampati: II  TM Distance: <3 FB Neck ROM: full    Dental  (+) Chipped   Pulmonary shortness of breath, neg sleep apnea, neg COPD, Patient abstained from smoking.Not current smoker Interstitial lung disease     + decreased breath sounds      Cardiovascular Exercise Tolerance: Good METShypertension, Pt. 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 Only baby aspirin Hx Polycythemia, sees heme/onc. No need for any intervention per their note. Per last heme/onc visit:  Leukocytosis: Chronic and unchanged.  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  Peripheral blood flow cytometry and BCR-ABL mutation are negative.  No evidence of systemic infection currently   Anesthesia Other Findings Past Medical History: No date: Anxiety disorder No date: Dermatitis 03/28/2020: Granulomatosis with polyangiitis (HCC) No date: Heart murmur No date: Hypercholesterolemia No date: Hypertension No date: Lymphadenitis No  date: Renal disorder No date: Skin cancer No date: Spontaneous ecchymoses No date: Vitamin D deficiency  Past Surgical History: No date: CARPAL TUNNEL RELEASE     Comment:  [facility] 01/21/2022: CATARACT EXTRACTION W/PHACO; Left     Comment:  Procedure: CATARACT EXTRACTION PHACO AND INTRAOCULAR               LENS PLACEMENT (IOC) LEFT;  Surgeon: Nevada Crane,               MD;  Location: [facility];  Service:               Ophthalmology;  Laterality: Left;  12.93 1:12.8 02/04/2022: CATARACT EXTRACTION W/PHACO; Right     Comment:  Procedure: CATARACT EXTRACTION PHACO AND INTRAOCULAR               LENS PLACEMENT (IOC) RIGHT 8.34 00:50.9;  Surgeon: Nevada Crane, MD;  Location: [facility];                Service: Ophthalmology;  Laterality: Right; 04/14/2020: ESOPHAGOGASTRODUODENOSCOPY (EGD) WITH PROPOFOL; N/A     Comment:  Procedure: ESOPHAGOGASTRODUODENOSCOPY (EGD) WITH               PROPOFOL;  Surgeon: Midge Minium, MD;  Location: [facility]               ENDOSCOPY;  Service: Endoscopy;  Laterality: N/A; 11/18/2020: HIP ARTHROPLASTY; Left     Comment:  Procedure: ARTHROPLASTY BIPOLAR HIP (HEMIARTHROPLASTY);               Surgeon: Christena Flake, MD;  Location: [facility] ORS;  Service: Orthopedics;  Laterality: Left; No date: TONSILLECTOMY 05/01/2015 : TRANSTHORACIC ECHOCARDIOGRAM     Comment:  EF 60-65%. Abnormal relaxation. Mild aortic stenosis               (peak gradient 19 mmHg).  BMI    Body Mass Index: 30.30 kg/m      Reproductive/Obstetrics negative OB ROS                             Anesthesia Physical Anesthesia Plan  ASA: 3  Anesthesia Plan: General ETT   Post-op Pain Management:    Induction: Intravenous  PONV Risk Score and Plan: Ondansetron, Dexamethasone, Midazolam and Treatment may vary due to age or medical condition  Airway Management Planned: Oral ETT  Additional Equipment:    Intra-op Plan:   Post-operative Plan: Extubation in OR  Informed Consent: I have reviewed the patients History and Physical, chart, labs and discussed the procedure including the risks, benefits and alternatives for the proposed anesthesia with the patient or authorized representative who has indicated his/her understanding and acceptance.     Dental Advisory Given  Plan Discussed with: Anesthesiologist, CRNA and Surgeon  Anesthesia Plan Comments: (Patient consented for risks of anesthesia including but not limited to:  - adverse reactions to medications - damage to eyes, teeth, lips or other oral mucosa - nerve damage due to positioning  - sore throat or hoarseness - Damage to heart, brain, nerves, lungs, other parts of body or loss of life  Patient voiced understanding.)        Anesthesia Quick Evaluation

## 2023-09-24 NOTE — Progress Notes (Signed)
Initial Nutrition Assessment  DOCUMENTATION CODES:   Obesity unspecified  INTERVENTION:   -Once diet is advanced, add:   -Ensure Enlive po BID, each supplement provides 350 kcal and 20 grams of protein.  -MVI with minerals daily  NUTRITION DIAGNOSIS:   Increased nutrient needs related to post-op healing as evidenced by estimated needs.  GOAL:   Patient will meet greater than or equal to 90% of their needs  MONITOR:   PO intake, Supplement acceptance, Diet advancement  REASON FOR ASSESSMENT:   Consult Assessment of nutrition requirement/status, Hip fracture protocol  ASSESSMENT:   Pt with medical history significant for hypertension, rheumatoid arthritis, chronic kidney disease,hyperlipidemia, dermatitis, anxiety disorder, lymphadenitis, vitamin D deficiency, anemia of chronic disease, aortic stenosis, anxiety presenting with fall and right hip fracture.  Pt admitted with rt femur fracture s/p fall.   Pt unavailable at time of visit. RD unable to obtain further nutrition-related history or complete nutrition-focused physical exam at this time.     Per orthopedics notes, plan for surgery today. Pt currently NPO for procedure.   Pt previously on a regular diet. No meal completion data available to assess at this time.   Reviewed wt hx; wt has been stable over the past 7 months.   Pt with increased nutritional needs for post-op healing and would benefit from addition of oral nutrition supplements.   Medications reviewed and include colace and prednisone.   Labs reviewed: K: 3.3. Phos: 2.3.    Diet Order:   Diet Order             Diet NPO time specified  Diet effective midnight                   EDUCATION NEEDS:   No education needs have been identified at this time  Skin:  Skin Assessment: Reviewed RN Assessment  Last BM:  Unknown  Height:   Ht Readings from Last 1 Encounters:  09/23/23 4\' 11"  (1.499 m)    Weight:   Wt Readings from Last 1  Encounters:  09/23/23 68 kg    Ideal Body Weight:  44.7 kg  BMI:  Body mass index is 30.3 kg/m.  Estimated Nutritional Needs:   Kcal:  1550-1750  Protein:  75-90 grams  Fluid:  > 1.5 L    Levada Schilling, RD, LDN, CDCES Registered Dietitian III Certified Diabetes Care and Education Specialist Please refer to [facility] for RD and/or RD on-call/weekend/after hours pager

## 2023-09-24 NOTE — Progress Notes (Signed)
PROGRESS NOTE    Linda Bradley  ION:[id number] DOB: [birth year] DOA: 09/23/2023 PCP: Dortha Kern, MD   Assessment & Plan:   Principal Problem:   Fall at home, initial encounter Active Problems:   Displaced intertrochanteric fracture of right femur, initial encounter for closed fracture [facility])   Chest pain   Essential hypertension   CKD stage 4 secondary to hypertension (HCC)   Anemia of chronic disease   Leukocytosis   On long term prednisone therapy   Aortic stenosis, mild  Assessment and Plan: Fall: at home. Will consult PT/OT when appropriate     Displaced intertrochanteric fracture of right femur: will go for surg today as per ortho surg. Oxycodone, morphine prn for pain. On long term steroids    Chest pain: etiology unclear. Hx of right leg DVT. V/Q scan ordered but pt refused today citing significant pain from her femur fracture. IV heparin on hold for surg today. Will start therapeutic lovenox tomorrow AM as per ortho surg. Hopefully V/Q scan can be done tomorrow after pt's surg is done. Troponin neg x 1. Echo ordered Cardio consulted  HTN: continue on coreg, amlodipine  CKDIV: Cr is trending down from day prior. 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 No need for a transfusion currently  Mild aortic stenosis: continue w/ supportive care.   Obesity: BMI 30.2. Would benefit from weight loss   DVT prophylaxis: lovenox (will start in AM as per ortho surg)  Code Status: full  Family Communication:  Disposition Plan: depends on PT/OT recs (not consulted yet)  Level of care: Progressive Status is: Inpatient Remains inpatient appropriate because: severity of illness    Consultants:  Ortho surg Cardio   Procedures:   Antimicrobials:   Subjective: Pt c/o leg pain   Objective: Vitals:   09/24/23 0644 09/24/23 0700 09/24/23 0730 09/24/23 0754  BP: (!) 155/70     Pulse: 87 88 86 86  Resp: 20 19 19    Temp:      TempSrc:      SpO2: 94% 92%  94%   Weight:      Height:       No intake or output data in the 24 hours ending 09/24/23 0809 Filed Weights   09/23/23 1637  Weight: 68 kg    Examination:  General exam: Appears calm but uncomfortable  Respiratory system: Clear to auscultation. Respiratory effort normal. Cardiovascular system: S1 & S2+. No rubs, gallops or clicks. Gastrointestinal system: Abdomen is obese, soft and nontender.  Normal bowel sounds heard. Central nervous system: Alert and oriented. Moves all extremities  Psychiatry: Judgement and insight appear normal. Mood & affect appropriate.     Data Reviewed: I have personally reviewed following labs and imaging studies  CBC: Recent Labs  Lab 09/23/23 1643 09/24/23 0327  WBC 20.2* 22.7*  HGB 14.8 13.4  HCT 44.9 39.8  MCV 88.6 84.9  PLT 214 188   Basic Metabolic Panel: Recent Labs  Lab 09/23/23 1643 09/24/23 0327  NA 137 137  K 3.5 3.3*  CL 99 101  CO2 23 25  GLUCOSE 210* 166*  BUN 41* 40*  CREATININE 1.59* 1.22*  CALCIUM 9.1 8.7*   GFR: Estimated Creatinine Clearance: 30.8 mL/min (A) (by C-G formula based on SCr of 1.22 mg/dL (H)). Liver Function Tests: Recent Labs  Lab 09/24/23 0327  AST 17  ALT 31  ALKPHOS 91  BILITOT 1.1  PROT 5.9*  ALBUMIN 3.2*   No results for input(s): "LIPASE", "AMYLASE"  in the last 168 hours. No results for input(s): "AMMONIA" in the last 168 hours. Coagulation Profile: No results for input(s): "INR", "PROTIME" in the last 168 hours. Cardiac Enzymes: Recent Labs  Lab 09/23/23 1643  CKTOTAL <5*   BNP (last 3 results) No results for input(s): "PROBNP" in the last 8760 hours. HbA1C: No results for input(s): "HGBA1C" in the last 72 hours. CBG: No results for input(s): "GLUCAP" in the last 168 hours. Lipid Profile: No results for input(s): "CHOL", "HDL", "LDLCALC", "TRIG", "CHOLHDL", "LDLDIRECT" in the last 72 hours. Thyroid Function Tests: No results for input(s): "TSH", "T4TOTAL", "FREET4",  "T3FREE", "THYROIDAB" in the last 72 hours. Anemia Panel: No results for input(s): "VITAMINB12", "FOLATE", "FERRITIN", "TIBC", "IRON", "RETICCTPCT" in the last 72 hours. Sepsis Labs: No results for input(s): "PROCALCITON", "LATICACIDVEN" in the last 168 hours.  No results found for this or any previous visit (from the past 240 hour(s)).       Radiology Studies: DG Femur Min 2 Views Right  Result Date: 09/23/2023 CLINICAL DATA:  Pre right hip fracture surgery evaluation. EXAM: RIGHT FEMUR 2 VIEWS COMPARISON:  Pelvis and right hip radiographs obtained earlier today. FINDINGS: The previously demonstrated comminuted intertrochanteric fracture on the right is not included on the current images. The remainder of the femur is unremarkable with no additional fractures seen. Mild patellofemoral and medial joint space degenerative changes. Diffuse arterial calcifications. IMPRESSION: 1. The previously demonstrated comminuted intertrochanteric fracture on the right is not included on the current images. 2. No additional fractures. Electronically Signed   By: Beckie Salts M.D.   On: 09/23/2023 19:42   DG Hip Unilat W or Wo Pelvis 2-3 Views Right  Result Date: 09/23/2023 CLINICAL DATA:  Fall today with right hip pain. Shortening and rotation. EXAM: DG HIP (WITH OR WITHOUT PELVIS) 2-3V RIGHT COMPARISON:  None Available. FINDINGS: Comminuted and displaced intertrochanteric right femur fracture. There is mild proximal migration of the femoral shaft. Apex lateral angulation. Femoral head is seated in the acetabulum, no hip dislocation. No pubic rami fracture. Pubic symphysis and sacroiliac joints are congruent. 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 There are prominent vascular calcifications IMPRESSION: Comminuted and displaced intertrochanteric right femur fracture. Electronically Signed   By: Narda Rutherford M.D.   On: 09/23/2023 19:06   DG Chest 1 View  Result Date: 09/23/2023 CLINICAL DATA:   Unwitnessed fall today. Initial encounter. Hip pain. EXAM: CHEST  1 VIEW COMPARISON:  Chest radiograph 11/18/2020 FINDINGS: Low lung volumes. Upper normal heart size. Post TAVR. Elevation of the right hemidiaphragm. No pneumothorax, large pleural effusion or focal airspace opacity. Bronchovascular crowding versus vascular congestion. IMPRESSION: Low lung volumes with bronchovascular crowding versus vascular congestion. Electronically Signed   By: Narda Rutherford M.D.   On: 09/23/2023 19:04        Scheduled Meds:  amLODipine  5 mg Oral Daily   aspirin  81 mg Oral Daily   carvedilol  25 mg Oral BID WC   clonazePAM  0.5 mg Oral QHS   docusate sodium  100 mg Oral BID   predniSONE  10 mg Oral Daily   Continuous Infusions:  lactated ringers 30 mL/hr at 09/23/23 2313   methocarbamol (ROBAXIN) IV       LOS: 1 day      Charise Killian, MD [facility] Pager [phone]  If 7PM-7AM, please contact night-coverage [email or web address] 09/24/2023, 8:09 AM

## 2023-09-24 NOTE — Assessment & Plan Note (Signed)
Vitals:   09/23/23 1700 09/23/23 1745 09/23/23 1800 09/23/23 1830  BP: (!) 166/81 (!) 164/72 (!) 166/83 (!) 160/87   09/23/23 1930 09/23/23 2000 09/23/23 2030 09/23/23 2100  BP: (!) 158/82 (!) 159/80 (!) 163/90 (!) 157/74   09/23/23 2130 09/23/23 2200 09/23/23 2300 09/24/23 0100  BP: (!) 124/55 120/66 138/66 (!) 147/76  We have continued patient on amlodipine, carvedilol and as needed hydralazine.

## 2023-09-24 NOTE — Op Note (Signed)
Patient Name: Linda Bradley  ZOX:[id number]  Pre-Operative Diagnosis: Right hip Intertrochanteric fracture with some reverse obliquity  Post-Operative Diagnosis: (same)  Procedure: Right Hip Intramedullary nail   Components/Implants: Nail:TFNA 80mm/130deg/235mm intermediate nail  Lag Blade :90mm  Locking Screw:39mm  Date of Surgery: 09/24/2023  Surgeon: Reinaldo Berber MD  Assistant: None    Anesthesiologist: Piscitello  Anesthesia: General   EBL: 200cc  Complications: None   Brief history: The patient is a 82 year old female who presented to the [facility] emergency room after a fall and found to have a  right hip intertrochanteric fracture.  The patient was admitted by the medical team and optimized for surgery.  A thorough discussion was had with the patient and family about the risks and benefits of surgical intervention for their hip fracture as definitive treatment.  The patient and family opted to proceed with the operation.  All preoperative films were reviewed and an appropriate surgical plan was made prior to surgery.   Description of procedure: The patient was brought to the operating room where laterality was confirmed by all those present to be the right side.  The patient was administered anesthesia on a stretcher prior to being moved supine on the operating room table. Patient was given an intravenous dose of antibiotics for surgical prophylaxis and TXA.  All bony prominences and extremities were well padded and the patient was securely attached to the table boots, a perineal post was placed and the patient had a safety strap placed.  Surgical site was prepped with alcohol and chlorhexidine. The surgical site over the hip was and draped in typical sterile fashion with multiple layers of adhesive and nonadhesive drapes.  The incision site was marked out with a sterile marker under fluoroscopic guidance.    A surgical timeout was then called with  participation of all staff in the room the patient was then a confirmed again and laterality confirmed. The hip fracture was reduced through indirect measures with traction and rotation of the leg on the fracture table. After an acceptable reduction was obtained on AP and lateral images the procedure started.  An incision was made just proximal to the greater trochanter through the skin subcutaneous tissues and an incision was made in the glut max fascia.  A guidewire was placed through the greater trochanter at the tip under x-ray guidance into the intertrochanteric region.  The position of this wire was assessed on AP and lateral fluoroscopic images to ensure position.  An opening reamer was used to create access at the tip of the greater trochanter under fluoroscopic guidance.   A size 10mm intermediate nail was advanced through the reamed hole in the proximal femur and seated within the femur to an appropriate depth under fluoroscopic guidance.  The aiming jig was used to mark an incision site for a lag blade to the femoral head which was then incised with a scalpel.  A wire was then advanced through the lateral cortex of the femur into the center of the femoral head on both AP and lateral fluoroscopic imaging stopping at the subchondral bone without penetration of the femoral head.  This wire was then used to measure for a lag screw and a size 90 lag blade was inserted into the femoral head through the lateral cortex of the femur and the nail under fluoroscopic guidance.  The setscrew was then tightened in the proximal part of the nail and engaged with the lag blade.  The lag screw driver was removed  and the aiming jig was switched for the distal interlocking screw.  A drill was used to drill a hole for the distal interlocking screw under fluoroscopic guidance and a size 34mm screw was placed.  The intramedullary nail was assessed on AP and lateral fluoroscopic guidance prior to removal of the aiming jig.   Final fluoroscopic x-rays were then taken after removal of the jig.  The nail was found to be in appropriate position on AP and lateral imaging with appropriate lengths of both the lag screw in the distal locking screw.  The fascia was closed with 0 Vicryl interrupted figure-of-eight sutures.  The subcutaneous tissues were closed with 2-0 Vicryl and the skin closed with 3-0 Monocryl and Dermabond.  Sterile dressings were applied to the incisions.   The patient was awoken from anesthesia transferred off of the operating room table onto a hospital bed.  The patient had a good pulse postoperatively in the foot . the patient was then transferred to the PACU in stable condition.

## 2023-09-24 NOTE — TOC CM/SW Note (Signed)
CSW acknowledges consult for SNF and SA resources. Per chart review, there appears to be no substance use history. H&P documents: "reports that she has never smoked. She has never used smokeless tobacco. She reports that she does not drink alcohol and does not use drugs." Please place PT and OT consults when appropriate so she can be evaluated for post-acute level of care.  Charlynn Court, CSW [phone]

## 2023-09-24 NOTE — Anesthesia Postprocedure Evaluation (Signed)
Anesthesia Post Note  Patient: Linda Bradley  Procedure(s) Performed: INTRAMEDULLARY (IM) NAIL INTERTROCHANTERIC (Right)  Patient location during evaluation: PACU Anesthesia Type: General Level of consciousness: awake and alert Pain management: pain level controlled Vital Signs Assessment: post-procedure vital signs reviewed and stable Respiratory status: spontaneous breathing, nonlabored ventilation, respiratory function stable and patient connected to nasal cannula oxygen Cardiovascular status: blood pressure returned to baseline and stable Postop Assessment: no apparent nausea or vomiting Anesthetic complications: no   No notable events documented.   Last Vitals:  Vitals:   09/24/23 1730 09/24/23 1745  BP: 126/66 125/60  Pulse: 65 66  Resp: 14 14  Temp:  36.7 C  SpO2: 94% 94%    Last Pain:  Vitals:   09/24/23 1745  TempSrc:   PainSc: 0-No pain                 Cleda Mccreedy Amala Petion

## 2023-09-24 NOTE — Assessment & Plan Note (Signed)
Pt fell and toppled over while bending over to pick a refrigerator magnet of the floor.  Patient fell on her right side.  Patient denies any dizziness recently or off and on.  Fall precautions.

## 2023-09-24 NOTE — Assessment & Plan Note (Addendum)
Discussed with daughter Maralyn Sago at bedside and son at bedside about patient having chest discomfort off and on for the past few weeks.  Patient states she had chest pain last earlier today.  Discussed with family that with patient's history of right leg DVT after her hip surgery patient is at high risk for PE.  Discussed with pharmacy about anticoagulation with heparin gtt. and recommended that as patient is not supposed to have anticoagulation after midnight we can ideally do a single dose of heparin for tonight until surgery.  Agreed with pharmacy and we will obtain a VQ scan in the meantime prior to surgery as well. 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  Repeat EKG today shows sinus rhythm 94 with a PR of 114 left atrial enlargement, old anterior infarct, and a borderline repolarization abnormality.  On comparison to her previous EKG from 2021 November patient has significant Q waves consistent with anteroseptal infarct as well.  However will get cardiology consult from [facility] clinic as pt is seen by Dr. Darrold Junker in the past for her aortic stenosis .Pt will benefit from cardiac eval and will place consult.

## 2023-09-24 NOTE — Assessment & Plan Note (Addendum)
Audible murmur.  We will get echo and cardiology consult.

## 2023-09-24 NOTE — Plan of Care (Signed)
  Problem: Education: Goal: Knowledge of General Education information will improve Description: Including pain rating scale, medication(s)/side effects and non-pharmacologic comfort measures Outcome: Progressing   Problem: Clinical Measurements: Goal: Postoperative complications will be avoided or minimized Outcome: Progressing   Problem: Pain Management: Goal: Pain level will decrease Outcome: Progressing

## 2023-09-24 NOTE — Progress Notes (Signed)
Patient awake/alert x4.  Moving bil lower ext, pulses intact.  Dressing x2 to right hip/thigh area c/d/I with dermabond and honeycomb. Patient denies pain while in pacu.

## 2023-09-24 NOTE — Assessment & Plan Note (Signed)
Secondary to long-term prednisone therapy patient is at high risk for fractures has a history of osteoporosis encouraged to follow-up with endocrinology for management of her osteoporosis as patient is at high risk for fractures.  Daughter and patient verbalized understanding.  Patient was taking something in the past however had a issue with calcium and had discontinued the medication they do not recall the name of it.

## 2023-09-24 NOTE — Assessment & Plan Note (Signed)
Patient sustained a displaced right intertrochanteric femur fracture after a fall at home today.  Greatly appreciate orthopedic consultation in the emergency room patient was seen urgently with plans for surgical intervention.  Additional measures and recommendations as far as mobility and pain control per orthopedic and physical therapy.  Plan is for n.p.o. after midnight along with anticoagulation held after midnight.

## 2023-09-24 NOTE — Interval H&P Note (Signed)
Patient history and physical updated. Consent reviewed including risks, benefits, and alternatives to surgery. Patient agrees with above plan to proceed with right hip intramedullary nail fixation for right intertrochanteric hip fracture.

## 2023-09-24 NOTE — Assessment & Plan Note (Signed)
Lab Results  Component Value Date   CREATININE 1.59 (H) 09/23/2023   CREATININE 1.46 (H) 03/06/2023   CREATININE 1.45 (H) 03/02/2021  Creatinine is stable.  Will renally dose medications that may be needed in addition to her baseline medications and also avoid contrast therapy.

## 2023-09-24 NOTE — Anesthesia Procedure Notes (Signed)
Procedure Name: Intubation Date/Time: 09/24/2023 3:33 PM  Performed by: Omer Jack, CRNAPre-anesthesia Checklist: Patient identified, Patient being monitored, Timeout performed, Emergency Drugs available and Suction available Patient Re-evaluated:Patient Re-evaluated prior to induction Oxygen Delivery Method: Circle system utilized Preoxygenation: Pre-oxygenation with 100% oxygen Induction Type: IV induction Ventilation: Mask ventilation without difficulty Laryngoscope Size: 3 and McGraph Grade View: Grade I Tube type: Oral Tube size: 7.0 mm Number of attempts: 1 Airway Equipment and Method: Stylet Placement Confirmation: ETT inserted through vocal cords under direct vision, positive ETCO2 and breath sounds checked- equal and bilateral Secured at: 20 cm Tube secured with: Tape Dental Injury: Teeth and Oropharynx as per pre-operative assessment

## 2023-09-24 NOTE — Consult Note (Signed)
[facility] CLINIC CARDIOLOGY CONSULT NOTE       Patient ID: CHYANNA KUENZEL MRN: [id number] DOB/AGE: [birth year] 82 y.o.  Admit date: 09/23/2023 Referring Physician Dr. Irena Cords Primary Physician Dr. Joen Laura Primary Cardiologist Dr. Dorothyann Peng Reason for Consultation chest pain, preoperative cardiac evaluation  HPI: AURIYAH Bradley is a 82 y.o. 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 Cardiology was consulted for further evaluation.   Patient reports that last night while she was at home she bent down to pick something up in her kitchen. She believes she lost her balance and fell.  She denies any syncope or dizziness/lightheadedness.  After this she had significant R hip pain and called EMS.  Evaluation in the ED was notable for right hip fracture, she was evaluated by orthopedic surgery who recommended surgical intervention.  The patient also reported in the ED that over the last few weeks that she has had 2-3 episodes of chest discomfort.  She has a significant cardiac history including recent TAVR in May as well as chronic HFpEF and coronary disease.  She describes the chest pain as "squeezing".  States that the pain is not severe and only lasts for a few minutes.  This generally resolves on its own.  States it "feels like a muscle pull".  The pain is central to right-sided.  Also reports some shortness of breath with the episodes that also resolves quickly.  She denies any new or worsening dyspnea on exertion or exertional chest pain.  States that overall she has felt well since her TAVR procedure in May.  At this time she denies any recurrence of chest pain.  States that she  has only had a couple of episodes.  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   Heart murmur    Hypercholesterolemia    Hypertension    Lymphadenitis    Renal disorder    Skin cancer    Spontaneous ecchymoses    Vitamin D deficiency     Past Surgical History:  Procedure Laterality Date   CARPAL TUNNEL RELEASE     [facility]   CATARACT EXTRACTION W/PHACO Left 01/21/2022   Procedure: CATARACT EXTRACTION PHACO AND INTRAOCULAR LENS PLACEMENT (IOC) LEFT;  Surgeon: Nevada Crane, MD;  Location: [facility];  Service: Ophthalmology;  Laterality: Left;  12.93 1:12.8   CATARACT EXTRACTION W/PHACO Right 02/04/2022   Procedure: CATARACT EXTRACTION PHACO AND INTRAOCULAR LENS PLACEMENT (IOC) RIGHT 8.34 00:50.9;  Surgeon: Nevada Crane, MD;  Location: [facility];  Service: Ophthalmology;  Laterality: Right;   ESOPHAGOGASTRODUODENOSCOPY (EGD) WITH PROPOFOL N/A 04/14/2020   Procedure: ESOPHAGOGASTRODUODENOSCOPY (EGD) WITH PROPOFOL;  Surgeon: Midge Minium, MD;  Location: [facility];  Service: Endoscopy;  Laterality: N/A;   HIP ARTHROPLASTY Left 11/18/2020   Procedure: ARTHROPLASTY BIPOLAR HIP (HEMIARTHROPLASTY);  Surgeon: Christena Flake, MD;  Location: [facility] ORS;  Service: Orthopedics;  Laterality: Left;   TONSILLECTOMY     TRANSTHORACIC ECHOCARDIOGRAM  05/01/2015    EF 60-65%. Abnormal relaxation. Mild aortic stenosis (peak gradient 19 mmHg).    (  Not in a hospital admission)  Social History   Socioeconomic History   Marital status: Widowed    Spouse name: Not on file   Number of children: Not on file   Years of education: Not on file   Highest education level: Not on file  Occupational History   Not on file  Tobacco Use   Smoking status: Never   Smokeless tobacco: Never  Vaping Use   Vaping status: Never Used   Substance and Sexual Activity   Alcohol use: No   Drug use: No   Sexual activity: Not on file  Other Topics Concern   Not on file  Social History Narrative   Not on file   Social Determinants of Health   Financial Resource Strain: Medium Risk (06/09/2023)   Received [facility]   Overall Financial Resource Strain (CARDIA)    Difficulty of Paying Living Expenses: Somewhat hard  Food Insecurity: No Food Insecurity (06/09/2023)   Received from [facility]   Hunger Vital Sign    Worried About Running Out of Food in the Last Year: Never true    Ran Out of Food in the Last Year: Never true  Transportation Needs: No Transportation Needs (06/09/2023)   Received from [facility] - [facility]    In the past 12 months, has lack of transportation kept you from medical appointments or from getting medications?: No    Lack of Transportation (Non-Medical): No  Physical Activity: Not on file  Stress: Not on file  Social Connections: Not on file  Intimate Partner Violence: Not on file    Family History  Problem Relation Age of Onset   Heart disease Father    Heart failure Father      Vitals:   09/24/23 0730 09/24/23 0754 09/24/23 0930 09/24/23 1000  BP:    (!) 135/56  Pulse: 86 86 77 76  Resp: 19  (!) 21 19  Temp:      TempSrc:      SpO2: 94%  94% 93%  Weight:      Height:        PHYSICAL EXAM General: Well appearing, well nourished, in no acute distress laying flat in ED stretcher. HEENT: Normocephalic and atraumatic. Neck: No JVD.  Lungs: Normal respiratory effort on room air. Clear bilaterally to auscultation. No wheezes, crackles, rhonchi.  Heart: HRRR. Normal S1 and S2 without gallops.  Systolic murmur. Abdomen: Non-distended appearing.  Msk: Normal strength and tone for age. Extremities: Warm and well perfused. No clubbing, cyanosis. no edema.  Neuro: Alert and oriented X 3. Psych: Answers questions  appropriately.   Labs: Basic Metabolic Panel: Recent Labs    09/23/23 1643 09/24/23 0327  NA 137 137  K 3.5 3.3*  CL 99 101  CO2 23 25  GLUCOSE 210* 166*  BUN 41* 40*  CREATININE 1.59* 1.22*  CALCIUM 9.1 8.7*   Liver Function Tests: Recent Labs    09/24/23 0327  AST 17  ALT 31  ALKPHOS 91  BILITOT 1.1  PROT 5.9*  ALBUMIN 3.2*   No results for input(s): "LIPASE", "AMYLASE" in the last 72 hours. CBC: Recent Labs    09/23/23 1643 09/24/23 0327  WBC 20.2* 22.7*  HGB 14.8 13.4  HCT 44.9 39.8  MCV 88.6 84.9  PLT 214 188   Cardiac Enzymes: Recent Labs    09/23/23 1643  CKTOTAL <5*  TROPONINIHS 11   BNP: No results for input(s): "  BNP" in the last 72 hours. D-Dimer: Recent Labs    09/23/23 1641  DDIMER 3.45*   Hemoglobin A1C: No results for input(s): "HGBA1C" in the last 72 hours. Fasting Lipid Panel: No results for input(s): "CHOL", "HDL", "LDLCALC", "TRIG", "CHOLHDL", "LDLDIRECT" in the last 72 hours. Thyroid Function Tests: No results for input(s): "TSH", "T4TOTAL", "T3FREE", "THYROIDAB" in the last 72 hours.  Invalid input(s): "FREET3" Anemia Panel: No results for input(s): "VITAMINB12", "FOLATE", "FERRITIN", "TIBC", "IRON", "RETICCTPCT" in the last 72 hours.   Radiology: DG Femur Min 2 Views Right  Result Date: 09/23/2023 CLINICAL DATA:  Pre right hip fracture surgery evaluation. EXAM: RIGHT FEMUR 2 VIEWS COMPARISON:  Pelvis and right hip radiographs obtained earlier today. FINDINGS: The previously demonstrated comminuted intertrochanteric fracture on the right is not included on the current images. The remainder of the femur is unremarkable with no additional fractures seen. Mild patellofemoral and medial joint space degenerative changes. Diffuse arterial calcifications. IMPRESSION: 1. The previously demonstrated comminuted intertrochanteric fracture on the right is not included on the current images. 2. No additional fractures. Electronically Signed    By: Beckie Salts M.D.   On: 09/23/2023 19:42   DG Hip Unilat W or Wo Pelvis 2-3 Views Right  Result Date: 09/23/2023 CLINICAL DATA:  Fall today with right hip pain. Shortening and rotation. EXAM: DG HIP (WITH OR WITHOUT PELVIS) 2-3V RIGHT COMPARISON:  None Available. FINDINGS: Comminuted and displaced intertrochanteric right femur fracture. There is mild proximal migration of the femoral shaft. Apex lateral angulation. Femoral head is seated in the acetabulum, no hip dislocation. No pubic rami fracture. Pubic symphysis and sacroiliac joints are congruent. 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 There are prominent vascular calcifications IMPRESSION: Comminuted and displaced intertrochanteric right femur fracture. Electronically Signed   By: Narda Rutherford M.D.   On: 09/23/2023 19:06   DG Chest 1 View  Result Date: 09/23/2023 CLINICAL DATA:  Unwitnessed fall today. Initial encounter. Hip pain. EXAM: CHEST  1 VIEW COMPARISON:  Chest radiograph 11/18/2020 FINDINGS: Low lung volumes. Upper normal heart size. Post TAVR. Elevation of the right hemidiaphragm. No pneumothorax, large pleural effusion or focal airspace opacity. Bronchovascular crowding versus vascular congestion. IMPRESSION: Low lung volumes with bronchovascular crowding versus vascular congestion. Electronically Signed   By: Narda Rutherford M.D.   On: 09/23/2023 19:04    ECHO pending  TELEMETRY reviewed by me 09/24/2023: sinus rhythm rate 80s  EKG reviewed by me: Normal sinus rhythm rate 94 bpm  Data reviewed by me 09/24/2023: last 24h vitals tele labs imaging I/O ED provider note, admission H&P, orthopedic note  Principal Problem:   Fall at home, initial encounter Active Problems:   Essential hypertension   Aortic stenosis, mild   Anemia of chronic disease   CKD stage 4 secondary to hypertension (HCC)   On long term prednisone therapy   Leukocytosis   Displaced intertrochanteric fracture of right femur, initial encounter  for closed fracture [facility])   Chest pain    ASSESSMENT AND PLAN:  ALYZAE DECRESCENZO is a 82 y.o. 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 Cardiology was consulted for further evaluation.   # Preoperative cardiac evaluation # Hip fracture # Fall Patient presenting from home  after fall found to have right hip fracture.  Plan for surgical repair later today -Patient is at elevated but acceptable risk for proceeding with hip surgery understanding risks/benefits.  -Optimized from cardiac standpoint, euvolemic on exam. BP and HR well controlled.  -Currently she is not in any respiratory distress, on room air. Questionable evaluation with VQ scan per primary team.  #Chest pain #Coronary artery disease # Aortic stenosis s/p TAVR 04/2023 Patient presenting to the ED with fall and also complaining of chest pain intermittently for the last few weeks.  Troponins negative x 1 at 11.  EKG nonacute.  Patient underwent a left heart catheterization 04/2023 and this demonstrated nonobstructive coronary disease (70% D1, 80% D2) -Continue home aspirin. Patient with documented statin allergy. -Continue home cardiac medications. -Echo pending  #Chronic kidney disease stage III-IV Patient with known CKD with Cr 1.59 on admission improved to 1.22 this AM.  -Continue to monitor renal function closely.  # History of DVT Patient with hx of DVT previously on Xarelto however this was discontinued in 2022 due to rectal bleeding.  -Continue home aspirin, subcutaneous heparin.   This patient's plan of care was discussed and created with Dr. Corky Sing and he is in agreement.  Signed: Gale Journey, PA-C  09/24/2023,  12:12 PM [facility]

## 2023-09-24 NOTE — Assessment & Plan Note (Signed)
    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

## 2023-09-25 ENCOUNTER — Other Ambulatory Visit: Payer: Medicare HMO

## 2023-09-25 ENCOUNTER — Inpatient Hospital Stay
Admit: 2023-09-25 | Discharge: 2023-09-25 | Disposition: A | Discharge status: Home / Self Care | Payer: Medicare HMO | Attending: Internal Medicine | Admitting: Internal Medicine

## 2023-09-25 DIAGNOSIS — S7291XA Unspecified fracture of right femur, initial encounter for closed fracture: Secondary | ICD-10-CM | POA: Diagnosis not present

## 2023-09-25 LAB — BASIC METABOLIC PANEL
Anion gap: 9 (ref 5–15)
BUN: 36 mg/dL — ABNORMAL HIGH (ref 8–23)
CO2: 25 mmol/L (ref 22–32)
Calcium: 8.5 mg/dL — ABNORMAL LOW (ref 8.9–10.3)
Chloride: 101 mmol/L (ref 98–111)
Creatinine, Ser: 1.31 mg/dL — ABNORMAL HIGH (ref 0.44–1.00)
GFR, Estimated: 41 mL/min — ABNORMAL LOW (ref >60)
Glucose, Bld: 194 mg/dL — ABNORMAL HIGH (ref 70–99)
Potassium: 3.8 mmol/L (ref 3.5–5.1)
Sodium: 135 mmol/L (ref 135–145)

## 2023-09-25 LAB — ECHOCARDIOGRAM COMPLETE BUBBLE STUDY
AR max vel: 0.93 cm2
AV Area VTI: 1.07 cm2
AV Area mean vel: 0.97 cm2
AV Mean grad: 23 mm[Hg]
AV Peak grad: 43 mm[Hg]
Ao pk vel: 3.28 m/s
Area-P 1/2: 3.74 cm2
MV VTI: 1.73 cm2
S' Lateral: 2.15 cm

## 2023-09-25 LAB — CBC
HCT: 38.9 % (ref 36.0–46.0)
Hemoglobin: 13.4 g/dL (ref 12.0–15.0)
MCH: 29.3 pg (ref 26.0–34.0)
MCHC: 34.4 g/dL (ref 30.0–36.0)
MCV: 84.9 fL (ref 80.0–100.0)
Platelets: 170 10*3/uL (ref 150–400)
RBC: 4.58 MIL/uL (ref 3.87–5.11)
RDW: 12.8 % (ref 11.5–15.5)
WBC: 24 10*3/uL — ABNORMAL HIGH (ref 4.0–10.5)
nRBC: 0 % (ref 0.0–0.2)

## 2023-09-25 MED ORDER — ENOXAPARIN SODIUM 30 MG/0.3ML IJ SOSY
30.0000 mg | PREFILLED_SYRINGE | Freq: Every day | INTRAMUSCULAR | Status: DC
  Administered 2023-09-25 – 2023-09-29 (×5): 30 mg via SUBCUTANEOUS
  Filled 2023-09-25 (×5): qty 0.3

## 2023-09-25 NOTE — Progress Notes (Signed)
PROGRESS NOTE    Linda Bradley  WUX:[id number] DOB: 10-06-1941 DOA: 09/23/2023 PCP: Dortha Kern, MD   Assessment & Plan:   Principal Problem:   Fall at home, initial encounter Active Problems:   Displaced intertrochanteric fracture of right femur, initial encounter for closed fracture [facility])   Chest pain   Essential hypertension   CKD stage 4 secondary to hypertension (HCC)   Anemia of chronic disease   Leukocytosis   On long term prednisone therapy   Aortic stenosis, mild  Assessment and Plan: Fall: at home. PT recs SNF. Pt wants to think SNF over     Displaced intertrochanteric fracture of right femur: s/p right hip intramedullary nail 09/24/23 as per ortho surg. Norco, morphine prn for pain. On long term steroids    Chest pain: etiology unclear. Hx of right leg DVT. V/Q scan ordered but pt refused citing significant pain from her femur fracture 09/24/23. Pt again refused V/Q scan so it was d/c 09/25/23. Saturating in low 90s on RA. Pt denies any chest pain so far today  HTN: continue on amlodipine, coreg   CKDIV: Cr is labile. Avoid nephrotoxic meds   Mild aortic stenosis: continue w/ supportive care   Obesity: BMI 30.2. Would benefit from weight loss   DVT prophylaxis:  lovenox Code Status: full  Family Communication: called pt's son, Onalee Hua, and answered his questions  Disposition Plan: PT recs SNF   Level of care: Progressive Status is: Inpatient Remains inpatient appropriate because: severity of illness    Consultants:  Ortho surg Cardio   Procedures:   Antimicrobials:   Subjective: Pt c/o hip pain   Objective: Vitals:   09/24/23 1959 09/24/23 2101 09/24/23 2356 09/25/23 0421  BP: 118/64  135/64 (!) 155/73  Pulse: 69  66 79  Resp: 18  17 18   Temp: 98 F (36.7 C)  97.6 F (36.4 C) 98.2 F (36.8 C)  TempSrc:      SpO2: 93% 93% 95% 95%  Weight:      Height:        Intake/Output Summary (Last 24 hours) at 09/25/2023 0847 Last data filed at  09/25/2023 0300 Gross per 24 hour  Intake 1797.5 ml  Output 200 ml  Net 1597.5 ml   Filed Weights   09/23/23 1637 09/24/23 1409  Weight: 68 kg 68 kg    Examination:  General exam: Appears calm but uncomfortable  Respiratory system: decreased breath sounds b/l  Cardiovascular system: S1/S2+. No rubs or clicks  Gastrointestinal system: Abd is soft, NT, obese & hypoactive bowel sounds  Central nervous system: Alert & awake. Moves all extremities   Psychiatry: Judgement and insight appears at baseline. Flat mood and affect    Data Reviewed: I have personally reviewed following labs and imaging studies  CBC: Recent Labs  Lab 09/23/23 1643 09/24/23 0327 09/25/23 0450  WBC 20.2* 22.7* 24.0*  HGB 14.8 13.4 13.4  HCT 44.9 39.8 38.9  MCV 88.6 84.9 84.9  PLT 214 188 170   Basic Metabolic Panel: Recent Labs  Lab 09/23/23 1643 09/24/23 0327 09/25/23 0450  NA 137 137 135  K 3.5 3.3* 3.8  CL 99 101 101  CO2 23 25 25   GLUCOSE 210* 166* 194*  BUN 41* 40* 36*  CREATININE 1.59* 1.22* 1.31*  CALCIUM 9.1 8.7* 8.5*   GFR: Estimated Creatinine Clearance: 28.7 mL/min (A) (by C-G formula based on SCr of 1.31 mg/dL (H)). Liver Function Tests: Recent Labs  Lab 09/24/23 0327  AST  17  ALT 31  ALKPHOS 91  BILITOT 1.1  PROT 5.9*  ALBUMIN 3.2*   No results for input(s): "LIPASE", "AMYLASE" in the last 168 hours. No results for input(s): "AMMONIA" in the last 168 hours. Coagulation Profile: No results for input(s): "INR", "PROTIME" in the last 168 hours. Cardiac Enzymes: Recent Labs  Lab 09/23/23 1643  CKTOTAL <5*   BNP (last 3 results) No results for input(s): "PROBNP" in the last 8760 hours. HbA1C: No results for input(s): "HGBA1C" in the last 72 hours. CBG: No results for input(s): "GLUCAP" in the last 168 hours. Lipid Profile: No results for input(s): "CHOL", "HDL", "LDLCALC", "TRIG", "CHOLHDL", "LDLDIRECT" in the last 72 hours. Thyroid Function Tests: No results  for input(s): "TSH", "T4TOTAL", "FREET4", "T3FREE", "THYROIDAB" in the last 72 hours. Anemia Panel: No results for input(s): "VITAMINB12", "FOLATE", "FERRITIN", "TIBC", "IRON", "RETICCTPCT" in the last 72 hours. Sepsis Labs: No results for input(s): "PROCALCITON", "LATICACIDVEN" in the last 168 hours.  No results found for this or any previous visit (from the past 240 hour(s)).       Radiology Studies: DG C-Arm 1-60 Min-No Report  Result Date: 09/24/2023 CLINICAL DATA:  Surgical internal fixation of right femoral fracture. EXAM: DG HIP (WITH OR WITHOUT PELVIS) 2-3V RIGHT; DG C-ARM 1-60 MIN-NO REPORT Radiation exposure index: 19.83 mGy. COMPARISON:  September 23, 2023. FINDINGS: Ten intraoperative fluoroscopic images were obtained of the right hip. These images demonstrate surgical internal fixation of proximal right femoral fracture. IMPRESSION: Fluoroscopic guidance provided during surgical internal fixation of proximal right femoral fracture. Electronically Signed   By: Lupita Raider M.D.   On: 09/24/2023 17:40   DG HIP UNILAT WITH PELVIS 2-3 VIEWS RIGHT  Result Date: 09/24/2023 CLINICAL DATA:  Surgical internal fixation of right femoral fracture. EXAM: DG HIP (WITH OR WITHOUT PELVIS) 2-3V RIGHT; DG C-ARM 1-60 MIN-NO REPORT Radiation exposure index: 19.83 mGy. COMPARISON:  September 23, 2023. FINDINGS: Ten intraoperative fluoroscopic images were obtained of the right hip. These images demonstrate surgical internal fixation of proximal right femoral fracture. IMPRESSION: Fluoroscopic guidance provided during surgical internal fixation of proximal right femoral fracture. Electronically Signed   By: Lupita Raider M.D.   On: 09/24/2023 17:40   DG Femur Min 2 Views Right  Result Date: 09/23/2023 CLINICAL DATA:  Pre right hip fracture surgery evaluation. EXAM: RIGHT FEMUR 2 VIEWS COMPARISON:  Pelvis and right hip radiographs obtained earlier today. FINDINGS: The previously demonstrated comminuted  intertrochanteric fracture on the right is not included on the current images. The remainder of the femur is unremarkable with no additional fractures seen. Mild patellofemoral and medial joint space degenerative changes. Diffuse arterial calcifications. IMPRESSION: 1. The previously demonstrated comminuted intertrochanteric fracture on the right is not included on the current images. 2. No additional fractures. Electronically Signed   By: Beckie Salts M.D.   On: 09/23/2023 19:42   DG Hip Unilat W or Wo Pelvis 2-3 Views Right  Result Date: 09/23/2023 CLINICAL DATA:  Fall today with right hip pain. Shortening and rotation. EXAM: DG HIP (WITH OR WITHOUT PELVIS) 2-3V RIGHT COMPARISON:  None Available. FINDINGS: Comminuted and displaced intertrochanteric right femur fracture. There is mild proximal migration of the femoral shaft. Apex lateral angulation. Femoral head is seated in the acetabulum, no hip dislocation. No pubic rami fracture. Pubic symphysis and sacroiliac joints are congruent. 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 There are prominent vascular calcifications IMPRESSION: Comminuted and displaced intertrochanteric right femur fracture. Electronically Signed   By:  Narda Rutherford M.D.   On: 09/23/2023 19:06   DG Chest 1 View  Result Date: 09/23/2023 CLINICAL DATA:  Unwitnessed fall today. Initial encounter. Hip pain. EXAM: CHEST  1 VIEW COMPARISON:  Chest radiograph 11/18/2020 FINDINGS: Low lung volumes. Upper normal heart size. Post TAVR. Elevation of the right hemidiaphragm. No pneumothorax, large pleural effusion or focal airspace opacity. Bronchovascular crowding versus vascular congestion. IMPRESSION: Low lung volumes with bronchovascular crowding versus vascular congestion. Electronically Signed   By: Narda Rutherford M.D.   On: 09/23/2023 19:04        Scheduled Meds:  amLODipine  5 mg Oral Daily   aspirin  81 mg Oral Daily   carvedilol  25 mg Oral BID WC   clonazePAM  0.5  mg Oral QHS   docusate sodium  100 mg Oral BID   feeding supplement  237 mL Oral BID BM   multivitamin with minerals  1 tablet Oral Daily   predniSONE  10 mg Oral Daily   Continuous Infusions:  methocarbamol (ROBAXIN) IV       LOS: 2 days      Charise Killian, MD [facility] Pager [phone]  If 7PM-7AM, please contact night-coverage [email or web address] 09/25/2023, 8:47 AM

## 2023-09-25 NOTE — [place] FL2 (Signed)
[place] MEDICAID FL2 LEVEL OF CARE FORM     IDENTIFICATION  Patient Name: Linda Bradley Birthdate: 13-Sep-1941 Sex: female Admission Date (Current Location): 09/23/2023  [facility] and [place] Number:  Chiropodist and Address:  [facility], [address], [place], [place] [address]      Provider Number: [id number]  Attending Physician Name and Address:  Charise Killian, MD  Relative Name and Phone Number:       Current Level of Care: Hospital Recommended Level of Care: Skilled Nursing Facility Prior Approval Number:    Date Approved/Denied:   PASRR Number: [id number]  Discharge Plan: SNF    Current Diagnoses: Patient Active Problem List   Diagnosis Date Noted   Chest pain 09/24/2023   Fall at home, initial encounter 09/23/2023   On long term prednisone therapy 11/18/2020   Stage 3b chronic kidney disease (HCC) 11/18/2020   Chronic back pain 11/18/2020   Leukocytosis 11/18/2020   Displaced intertrochanteric fracture of right femur, initial encounter for closed fracture (HCC) 11/18/2020   Closed fracture of multiple pubic rami, left, initial encounter (HCC) 11/18/2020   Closed intertrochanteric fracture of left femur (HCC) 11/18/2020   Melena    Anemia, posthemorrhagic, acute    Acute on chronic respiratory failure with hypoxia (HCC) 04/11/2020   CKD stage 4 secondary to hypertension (HCC) 04/11/2020   Interstitial lung disease (HCC) 04/11/2020   History of anemia due to chronic kidney disease 04/11/2020   Hypertensive urgency 04/11/2020   Granulomatosis with polyangiitis (HCC) 03/28/2020   Pauci-immune Glomerulonephritis    Acute congestive heart failure (HCC)    AKI (acute kidney injury) (HCC)    Anemia of chronic disease    Dyspnea 03/16/2020   Hypokalemia 03/14/2016   Hyponatremia 03/14/2016   Aortic stenosis, mild 05/26/2015   Abnormal EKG 04/14/2015   Dyspnea on exertion 04/14/2015   Essential hypertension  04/14/2015    Orientation RESPIRATION BLADDER Height & Weight     Self, Situation, Place, Time  Normal Continent Weight: 149 lb 14.6 oz (68 kg) Height:  4\' 11"  (149.9 cm)  BEHAVIORAL SYMPTOMS/MOOD NEUROLOGICAL BOWEL NUTRITION STATUS   (None)  (None) Continent Diet (Regular)  AMBULATORY STATUS COMMUNICATION OF NEEDS Skin   Limited Assist Verbally Surgical wounds (Incisions on right hip and right thigh: Dermabond.)                       Personal Care Assistance Level of Assistance              Functional Limitations Info  Sight, Hearing, Speech Sight Info: Adequate Hearing Info: Adequate Speech Info: Adequate    SPECIAL CARE FACTORS FREQUENCY  PT (By licensed PT)     PT Frequency: 5 x week              Contractures Contractures Info: Not present    Additional Factors Info  Code Status, Allergies Code Status Info: Full code Allergies Info: Influenza Vaccines, Xarelto (Rivaroxaban), Statins, Chlorthalidone           Current Medications (09/25/2023):  This is the current hospital active medication list Current Facility-Administered Medications  Medication Dose Route Frequency Provider Last Rate Last Admin   acetaminophen (TYLENOL) tablet 325-650 mg  325-650 mg Oral Q6H PRN Reinaldo Berber, MD       amLODipine (NORVASC) tablet 5 mg  5 mg Oral Daily Gertha Calkin, MD   5 mg at 09/25/23 0902   aspirin chewable tablet  81 mg  81 mg Oral Daily Gertha Calkin, MD   81 mg at 09/25/23 0902   carvedilol (COREG) tablet 25 mg  25 mg Oral BID WC Gertha Calkin, MD   25 mg at 09/24/23 1843   clonazePAM (KLONOPIN) tablet 0.5 mg  0.5 mg Oral QHS Gertha Calkin, MD   0.5 mg at 09/24/23 2228   docusate sodium (COLACE) capsule 100 mg  100 mg Oral BID Gertha Calkin, MD   100 mg at 09/23/23 2214   enoxaparin (LOVENOX) injection 30 mg  30 mg Subcutaneous Daily Patel, Kishan S, RPH       feeding supplement (ENSURE ENLIVE / ENSURE PLUS) liquid 237 mL  237 mL Oral BID BM Charise Killian, MD   237 mL at 09/25/23 0905   fentaNYL (SUBLIMAZE) injection 25 mcg  25 mcg Intravenous Q4H PRN Gertha Calkin, MD       hydrALAZINE (APRESOLINE) injection 5 mg  5 mg Intravenous Q4H PRN Gertha Calkin, MD       HYDROcodone-acetaminophen (NORCO) 7.5-325 MG per tablet 1-2 tablet  1-2 tablet Oral Q4H PRN Reinaldo Berber, MD   2 tablet at 09/25/23 4098   HYDROcodone-acetaminophen (NORCO/VICODIN) 5-325 MG per tablet 1-2 tablet  1-2 tablet Oral Q6H PRN Gertha Calkin, MD   1 tablet at 09/24/23 1191   HYDROcodone-acetaminophen (NORCO/VICODIN) 5-325 MG per tablet 1-2 tablet  1-2 tablet Oral Q4H PRN Reinaldo Berber, MD       menthol-cetylpyridinium (CEPACOL) lozenge 3 mg  1 lozenge Oral PRN Reinaldo Berber, MD       Or   phenol (CHLORASEPTIC) mouth spray 1 spray  1 spray Mouth/Throat PRN Reinaldo Berber, MD       methocarbamol (ROBAXIN) tablet 500 mg  500 mg Oral Q6H PRN Gertha Calkin, MD       Or   methocarbamol (ROBAXIN) 500 mg in dextrose 5 % 50 mL IVPB  500 mg Intravenous Q6H PRN Gertha Calkin, MD       metoCLOPramide (REGLAN) tablet 5-10 mg  5-10 mg Oral Q8H PRN Reinaldo Berber, MD       Or   metoCLOPramide (REGLAN) injection 5-10 mg  5-10 mg Intravenous Q8H PRN Reinaldo Berber, MD       morphine (PF) 2 MG/ML injection 0.5-1 mg  0.5-1 mg Intravenous Q2H PRN Reinaldo Berber, MD       morphine (PF) 2 MG/ML injection 2 mg  2 mg Intravenous Q2H PRN Gertha Calkin, MD   2 mg at 09/23/23 2214   multivitamin with minerals tablet 1 tablet  1 tablet Oral Daily Charise Killian, MD       ondansetron [facility]) tablet 4 mg  4 mg Oral Q6H PRN Reinaldo Berber, MD       Or   ondansetron (ZOFRAN) injection 4 mg  4 mg Intravenous Q6H PRN Reinaldo Berber, MD       polyethylene glycol (MIRALAX / GLYCOLAX) packet 17 g  17 g Oral Daily PRN Gertha Calkin, MD       predniSONE (DELTASONE) tablet 10 mg  10 mg Oral Daily Gertha Calkin, MD   10 mg at 09/25/23 4782     Discharge Medications: Please  see discharge summary for a list of discharge medications.  Relevant Imaging Results:  Relevant Lab Results:   Additional Information SS#: [id number]  Margarito Liner, LCSW

## 2023-09-25 NOTE — Progress Notes (Addendum)
Physical Therapy Treatment Patient Details Name: Linda Bradley MRN: [id number] DOB: [birth year] Today's Date: 09/25/2023   History of Present Illness Patient is a 82 y.o. 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 Current MD assessment: s/p R hip IM nail secondary to fracture from recent fall.    PT Comments  Pt was pleasant and motivated to participate during the session and put forth good effort throughout. Pt found supine in bed with family (daughter) in room. Pt improving with overall functional ability compared to prior session. She is Mod A for bed mobility and transfers. Cues provided for hand placement and LE sequencing during bed mobility. She was able to perform STS with greater confidence and quality, only needing cues for forwards weight shift; Still needs increased time to perform. Once standing, pr able to stand for longer a longer duration and is showing improved weight shift ability, although still unable to take true steps or shuffle. Pt educated on and perform various resisted/non-resisted LE exercises once back in bed. Pt will benefit from continued PT services upon discharge to safely address deficits listed in patient problem list for decreased caregiver assistance and eventual return to PLOF.    If plan is discharge home, recommend the following: A lot of help with walking and/or transfers;A lot of help with bathing/dressing/bathroom;Help with stairs or ramp for entrance;Assist for transportation;Assistance with cooking/housework   Can travel by private vehicle     No  Equipment Recommendations  Other (comment) (TBD)    Recommendations for Other Services       Precautions / Restrictions Precautions Precautions:  Fall Restrictions Weight Bearing Restrictions: Yes RLE Weight Bearing: Weight bearing as tolerated     Mobility  Bed Mobility Overal bed mobility: Needs Assistance Bed Mobility: Supine to Sit, Sit to Supine     Supine to sit: Mod assist Sit to supine: Mod assist   General bed mobility comments: able to support self and help pull-up using bed rails,Continues to need extensive VC's, still needing significant physical assist with all aspects of bed mobility.    Transfers Overall transfer level: Needs assistance Equipment used: Rolling walker (2 wheels) Transfers: Sit to/from Stand Sit to Stand: Mod assist           General transfer comment: Performed x2 STS trial, pt displaiyng greater quality and confidence of transfer compared to prior session. Still needs increased time but overall more smooth and quicker than prior session. Continues to need extensive multi-modal cues and set-up assist.    Ambulation/Gait Ambulation/Gait assistance: Min assist Gait Distance (Feet): 2 Feet Assistive device: Rolling walker (2 wheels) Gait Pattern/deviations: Shuffle Gait velocity: decreased     General Gait Details: Pt able to show increased weight shifting ability once standing, but still unable to take true ambulatory steps or shuffling. Reliant on RW, improved with overall standing endurance as well.   Stairs             Wheelchair Mobility     Tilt Bed    Modified Rankin (Stroke Patients Only)       Balance Overall balance assessment: Needs assistance   Sitting balance-Leahy Scale: Fair Sitting balance - Comments: needing initial asssit for posterior lean, able to correct with cues   Standing balance support: Bilateral upper extremity supported, During functional activity, Reliant on assistive device for  balance Standing balance-Leahy Scale: Poor Standing balance comment: Unable to stand or balance without assist or RW                             Cognition Arousal: Alert Behavior During Therapy: WFL for tasks assessed/performed Overall Cognitive Status: Within Functional Limits for tasks assessed                                          Exercises Total Joint Exercises Ankle Circles/Pumps: 10 reps, AROM, Both, Strengthening Quad Sets: AROM, Both, 10 reps, Strengthening Gluteal Sets: AROM, Both, 10 reps, Strengthening Short Arc Quad: AROM, Right, 10 reps, Strengthening Heel Slides: AAROM, Right, 10 reps, Strengthening (resisted) Hip ABduction/ADduction: AAROM, Right, 10 reps, Strengthening (assist for friction) Other Exercises Other Exercises: pt educated on exercise program/packet info. Educated on importance of frequent exercises to improve LE strength and functional ability. pt verbalized understand    General Comments  VSS throughout session      Pertinent Vitals/Pain Pain Assessment Pain Assessment: 0-10 Pain Score: 6  (with movement, none at rest) Pain Location: R hip Pain Descriptors / Indicators: Grimacing, Guarding, Sore Pain Intervention(s): Limited activity within patient's tolerance, Monitored during session, Premedicated before session    Home Living                          Prior Function            PT Goals (current goals can now be found in the care plan section) Progress towards PT goals: Progressing toward goals    Frequency    BID      PT Plan      Co-evaluation              AM-PAC PT "6 Clicks" Mobility   Outcome Measure  Help needed turning from your back to your side while in a flat bed without using bedrails?: A Lot Help needed moving from lying on your back to sitting on the side of a flat bed without using bedrails?: A Lot Help needed moving to and from a bed to a chair (including a wheelchair)?: A Lot Help needed standing up from a chair using your arms (e.g., wheelchair or bedside chair)?: A Little Help needed to walk in hospital room?: A  Lot Help needed climbing 3-5 steps with a railing? : Total 6 Click Score: 12    End of Session Equipment Utilized During Treatment: Gait belt Activity Tolerance: Patient tolerated treatment well;Patient limited by pain Patient left: in bed;with bed alarm set;with call bell/phone within reach Nurse Communication: Mobility status PT Visit Diagnosis: Unsteadiness on feet (R26.81);Other abnormalities of gait and mobility (R26.89);Muscle weakness (generalized) (M62.81);Difficulty in walking, not elsewhere classified (R26.2);Pain Pain - Right/Left: Right Pain - part of body: Hip     Time: 6295-2841 PT Time Calculation (min) (ACUTE ONLY): 32 min  Charges:                            Cecile Sheerer, SPT 09/25/23, 3:44 PM This entire session was performed under direct supervision and direction of a licensed therapist/therapist assistant. I have personally read, edited and approve of the note as written.  Loran Senters, DPT

## 2023-09-25 NOTE — TOC Initial Note (Signed)
Transition of Care [facility]) - Initial/Assessment Note    Patient Details  Name: Linda Bradley MRN: [id number] Date of Birth: May 13, 1941  [facility]) CM/SW Contact:    Darolyn Rua, LCSW Phone Number: 09/25/2023, 12:19 PM  Clinical Narrative:                  CSW spoke with patient about SNF recommendations, she reports being agreeable with preference of [facility] as it's close to home. CSW informed her they do not accept her [employer], she's agreeable for additional referrals to be sent out for bed offers.   Referrals sent pending bed offers.   Expected Discharge Plan: Skilled Nursing Facility Barriers to Discharge: Continued Medical Work up   Patient Goals and CMS Choice Patient states their goals for this hospitalization and ongoing recovery are:: to go home CMS Medicare.gov Compare Post Acute Care list provided to:: Patient Choice offered to / list presented to : Patient      Expected Discharge Plan and Services       Living arrangements for the past 2 months: Single Family Home                                      Prior Living Arrangements/Services Living arrangements for the past 2 months: Single Family Home Lives with:: Self                   Activities of Daily Living   ADL Screening (condition at time of admission) Independently performs ADLs?: No Does the patient have a NEW difficulty with bathing/dressing/toileting/self-feeding that is expected to last >3 days?: Yes (Initiates electronic notice to provider for possible OT consult) Does the patient have a NEW difficulty with getting in/out of bed, walking, or climbing stairs that is expected to last >3 days?: Yes (Initiates electronic notice to provider for possible PT consult) Does the patient have a NEW difficulty with communication that is expected to last >3 days?: No Is the patient deaf or have difficulty hearing?: No Does the patient have difficulty seeing, even when  wearing glasses/contacts?: No Does the patient have difficulty concentrating, remembering, or making decisions?: No  Permission Sought/Granted                  Emotional Assessment   Attitude/Demeanor/Rapport: Gracious Affect (typically observed): Calm Orientation: : Oriented to Self, Oriented to Place, Oriented to  Time, Oriented to Situation Alcohol / Substance Use: Not Applicable Psych Involvement: No (comment)  Admission diagnosis:  Closed fracture of right hip, initial encounter (HCC) [S72.001A] Fall at home, initial encounter [W19.Lorne Skeens, Y92.009] Patient Active Problem List   Diagnosis Date Noted   Chest pain 09/24/2023   Fall at home, initial encounter 09/23/2023   On long term prednisone therapy 11/18/2020   Stage 3b chronic kidney disease (HCC) 11/18/2020   Chronic back pain 11/18/2020   Leukocytosis 11/18/2020   Displaced intertrochanteric fracture of right femur, initial encounter for closed fracture (HCC) 11/18/2020   Closed fracture of multiple pubic rami, left, initial encounter (HCC) 11/18/2020   Closed intertrochanteric fracture of left femur (HCC) 11/18/2020   Melena    Anemia, posthemorrhagic, acute    Acute on chronic respiratory failure with hypoxia (HCC) 04/11/2020   CKD stage 4 secondary to hypertension (HCC) 04/11/2020   Interstitial lung disease (HCC) 04/11/2020   History of anemia due to chronic kidney disease 04/11/2020  Hypertensive urgency 04/11/2020   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  RO[address] [place] [address] Phone: [phone] Fax: [phone]     Social Determinants of Health (SDOH) Social History: SDOH Screenings   Food Insecurity: No Food Insecurity (09/24/2023)  Housing: Low Risk  (09/24/2023)  Transportation Needs: No Transportation Needs (09/24/2023)  Utilities: Not At Risk (09/24/2023)  Financial Resource Strain: Medium Risk (06/09/2023)   Received from [facility]  Tobacco Use: Low Risk  (09/24/2023)   SDOH Interventions:     Readmission Risk Interventions     No data to display

## 2023-09-25 NOTE — Evaluation (Addendum)
Physical Therapy Evaluation Patient Details Name: Linda Bradley MRN: [id number] DOB: 1941-06-14 Today's Date: 09/25/2023  History of Present Illness  Patient is a 82 y.o. 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 Current MD assessment: s/p R hip IM nail secondary to fracture from recent fall.  Clinical Impression  Pt was pleasant and motivated to participate during the session and put forth good effort throughout. Patient overall limited by severe R hip pain with movement during session, HR and SpO2 monitored during session secondary to pt's labored breathing, HR and SpO2 were [facility] throughout. She is +2 mod A for bed mobility at this time, and is mod A for 2 STS trials with heavy cues for hand placement, sequencing, and posture, pt making minimal changes with cues. Once up pt able to take a few shuffling steps with RW and Min A for weight shift assist and AD management. Pt will benefit from continued PT services upon discharge to safely address deficits listed in patient problem list for decreased caregiver assistance and eventual return to PLOF.       If plan is discharge home, recommend the following: A lot of help with walking and/or transfers;A lot of help with bathing/dressing/bathroom;Help with stairs or ramp for entrance;Assist for transportation;Assistance with cooking/housework   Can travel by private vehicle   No    Equipment Recommendations Other (comment) (TBD)  Recommendations for Other Services       Functional Status Assessment Patient has had a recent decline in their functional status and demonstrates the ability to make significant improvements in function in a reasonable and predictable amount of time.      Precautions / Restrictions Precautions Precautions: Fall Restrictions Weight Bearing Restrictions: Yes RLE Weight Bearing: Weight bearing as tolerated      Mobility  Bed Mobility Overal bed mobility: Needs Assistance Bed Mobility: Supine to Sit, Sit to Supine     Supine to sit: +2 for physical assistance, Mod assist Sit to supine: +2 for physical assistance, Mod assist   General bed mobility comments: +2 assist for bed mobility, pt limited by pain to perform independently    Transfers Overall transfer level: Needs assistance Equipment used: Rolling walker (2 wheels) Transfers: Sit to/from Stand Sit to Stand: Mod assist           General transfer comment: Performed 2x STS trials. various cues needed for appropriate hand palcement and sequencing. Cues provided for forwards weight shift and to have wide BOS before standing. Overall very slow, needing cues for upright posture.    Ambulation/Gait Ambulation/Gait assistance: Min assist Gait Distance (Feet): Assistive device: Rolling walker (2 wheels) Gait Pattern/deviations: Shuffle Gait velocity: decreased     General Gait Details: Min A for weight shift assist and AD management. No true ambulation/foot clearance, pt managed a few heel-toe side shuffles towards HOB, reliant on RW  Stairs            Wheelchair Mobility     Tilt Bed    Modified Rankin (Stroke Patients Only)       Balance Overall balance assessment: Needs assistance   Sitting balance-Leahy Scale: Fair Sitting balance - Comments: needing initial asssit for posterior lean, able to correct with cues   Standing balance support: Bilateral upper extremity supported, During functional activity, Reliant on assistive device for balance Standing balance-Leahy  Scale: Poor Standing balance comment: Unable to stand or balance without assist or RW                             Pertinent Vitals/Pain Pain Assessment Pain Assessment:  0-10 Pain Score: 5  (pt reprots 3-5 at rest; increased with movement but unable to clarify number) Pain Location: R hip Pain Descriptors / Indicators: Sore, Contraction, Grimacing, Guarding, Moaning Pain Intervention(s): Monitored during session, Premedicated before session, Limited activity within patient's tolerance    Home Living Family/patient expects to be discharged to:: Private residence Living Arrangements: Children Available Help at Discharge: Family;Available PRN/intermittently (Son works standard hours, Grandson does [employer]. both availible as needed) Type of Home: House Home Access: Stairs to enter Entrance Stairs-Rails: Right Entrance Stairs-Number of Steps: 5   Home Layout: One level Home Equipment: Agricultural consultant (2 wheels);Cane - single point;Shower seat;BSC/3in1      Prior Function Prior Level of Function : Needs assist;History of Falls (last six months)             Mobility Comments: Limited community ambulator, able to walk short distances but typically uses facility scooters/wheelchairs when available. Uses RW at baseline. reports recent fall (reason for admission) when attempting to use the bathroom . ADLs Comments: Pt reports she is IND with bathing and bathroom use     Extremity/Trunk Assessment   Upper Extremity Assessment Upper Extremity Assessment: Generalized weakness    Lower Extremity Assessment Lower Extremity Assessment: Generalized weakness;RLE deficits/detail RLE Deficits / Details: s/p R hip IM nail       Communication   Communication Communication: No apparent difficulties  Cognition Arousal: Alert Behavior During Therapy: WFL for tasks assessed/performed Overall Cognitive Status: Within Functional Limits for tasks assessed                                          General Comments      Exercises     Assessment/Plan    PT Assessment Patient needs continued PT services  PT Problem List Decreased  strength;Decreased coordination;Decreased range of motion;Decreased activity tolerance;Decreased balance;Decreased mobility;Decreased knowledge of use of DME;Decreased safety awareness       PT Treatment Interventions DME instruction;Balance training;Gait training;Stair training;Functional mobility training;Therapeutic activities;Therapeutic exercise    PT Goals (Current goals can be found in the Care Plan section)  Acute Rehab PT Goals Patient Stated Goal: walk again, get home PT Goal Formulation: With patient Time For Goal Achievement: 10/08/23 Potential to Achieve Goals: Fair    Frequency BID     Co-evaluation               AM-PAC PT "6 Clicks" Mobility  Outcome Measure Help needed turning from your back to your side while in a flat bed without using bedrails?: A Lot Help needed moving from lying on your back to sitting on the side of a flat bed without using bedrails?: A Lot Help needed moving to and from a bed to a chair (including a wheelchair)?: A Lot Help needed standing up from a chair using your arms (e.g., wheelchair or bedside chair)?: A Lot Help needed to walk in hospital room?: A Lot Help needed climbing 3-5 steps with a railing? : Total 6 Click Score: 11    End of Session Equipment Utilized During Treatment: Gait belt Activity Tolerance: Patient limited by pain  Patient left: in bed;with bed alarm set;with call bell/phone within reach Nurse Communication: Mobility status PT Visit Diagnosis: Unsteadiness on feet (R26.81);Other abnormalities of gait and mobility (R26.89);Muscle weakness (generalized) (M62.81);Difficulty in walking, not elsewhere classified (R26.2);Pain Pain - Right/Left: Right Pain - part of body: Hip    Time: 4259-5638 PT Time Calculation (min) (ACUTE ONLY): 44 min   Charges:                 Cecile Sheerer, SPT 09/25/23, 11:13 AM This entire session was performed under direct supervision and direction of a licensed  therapist/therapist assistant. I have personally read, edited and approve of the note as written.  Loran Senters, DPT

## 2023-09-25 NOTE — Progress Notes (Signed)
*  PRELIMINARY RESULTS* Echocardiogram 2D Echocardiogram has been performed.  Cristela Blue 09/25/2023, 9:28 AM

## 2023-09-25 NOTE — Progress Notes (Signed)
  Subjective: 1 Day Post-Op Procedure(s) (LRB): INTRAMEDULLARY (IM) NAIL INTERTROCHANTERIC (Right) Patient reports pain as mild while laying in bed this AM. PT and care management to assist with discharge planning. Negative for chest pain and shortness of breath Fever: no Gastrointestinal:Negative for nausea and vomiting Reports she is passing gas this AM.  Objective: Vital signs in last 24 hours: Temp:  [97.6 F (36.4 C)-98.6 F (37 C)] 98.2 F (36.8 C) (10/03 0421) Pulse Rate:  [64-85] 79 (10/03 0421) Resp:  [14-21] 18 (10/03 0421) BP: (100-155)/(56-84) 155/73 (10/03 0421) SpO2:  [93 %-99 %] 95 % (10/03 0421) Weight:  [68 kg] 68 kg (10/02 1409)  Intake/Output from previous day:  Intake/Output Summary (Last 24 hours) at 09/25/2023 0856 Last data filed at 09/25/2023 0300 Gross per 24 hour  Intake 1797.5 ml  Output 200 ml  Net 1597.5 ml    Intake/Output this shift: No intake/output data recorded.  Labs: Recent Labs    09/23/23 1643 09/24/23 0327 09/25/23 0450  HGB 14.8 13.4 13.4   Recent Labs    09/24/23 0327 09/25/23 0450  WBC 22.7* 24.0*  RBC 4.69 4.58  HCT 39.8 38.9  PLT 188 170   Recent Labs    09/24/23 0327 09/25/23 0450  NA 137 135  K 3.3* 3.8  CL 101 101  CO2 25 25  BUN 40* 36*  CREATININE 1.22* 1.31*  GLUCOSE 166* 194*  CALCIUM 8.7* 8.5*   No results for input(s): "LABPT", "INR" in the last 72 hours.   EXAM General - Patient is Alert, Appropriate, and Oriented Extremity - ABD soft Neurovascular intact Dorsiflexion/Plantar flexion intact Incision: dressing C/D/I No cellulitis present Compartment soft Dressing/Incision - clean, dry, no drainage noted to the right hip honeycomb dressing Motor Function - intact, moving foot and toes well on exam.  Abdomen soft with intact bowel sounds.  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   Heart murmur     Hypercholesterolemia    Hypertension    Lymphadenitis    Renal disorder    Skin cancer    Spontaneous ecchymoses    Vitamin D deficiency     Assessment/Plan: 1 Day Post-Op Procedure(s) (LRB): INTRAMEDULLARY (IM) NAIL INTERTROCHANTERIC (Right) Principal Problem:   Fall at home, initial encounter Active Problems:   Essential hypertension   Aortic stenosis, mild   Anemia of chronic disease   CKD stage 4 secondary to hypertension (HCC)   On long term prednisone therapy   Leukocytosis   Displaced intertrochanteric fracture of right femur, initial encounter for closed fracture (HCC)   Chest pain  Estimated body mass index is 30.28 kg/m as calculated from the following:   Height as of this encounter: 4\' 11"  (1.499 m).   Weight as of this encounter: 68 kg. Advance diet Up with therapy D/C IV fluids when tolerating po intake.  Labs and vitals reviewed.  WBC 24.0 this AM, Hg 13.4. Continue cardia monitoring at this time. Up with therapy.  PT and care management to assist with discharge planning. Denies any abdominal pain, continue to work on BM.  Passing gas this AM.  DVT Prophylaxis - TED hose, currently not on any prescription blood thinner. Weight-Bearing as tolerated to right leg  J. Horris Latino, PA-C [facility] Orthopaedic Surgery 09/25/2023, 8:56 AM

## 2023-09-25 NOTE — Plan of Care (Signed)
ALL STAFF: Patient very opinionated and knows what she wants and doesnt. Takes lots of education and patience. Please try to encourage patient to work with PT and take pain meds if preventing participation.  Per normal protocol and orders, patient also needs stool softners and to at least use BSC , instead of purwick. A unified POC and discussions will make it easier on all of [place] (including the patient).  Thank you!

## 2023-09-25 NOTE — Plan of Care (Signed)
  Problem: Education: Goal: Knowledge of General Education information will improve Description: Including pain rating scale, medication(s)/side effects and non-pharmacologic comfort measures Outcome: Progressing   Problem: Activity: Goal: Ability to ambulate and perform ADLs will improve Outcome: Progressing   Problem: Clinical Measurements: Goal: Postoperative complications will be avoided or minimized Outcome: Progressing   Problem: Pain Management: Goal: Pain level will decrease Outcome: Progressing

## 2023-09-26 ENCOUNTER — Encounter: Payer: Self-pay | Admitting: Orthopedic Surgery

## 2023-09-26 DIAGNOSIS — S7291XA Unspecified fracture of right femur, initial encounter for closed fracture: Secondary | ICD-10-CM | POA: Diagnosis not present

## 2023-09-26 LAB — URINALYSIS, COMPLETE (UACMP) WITH MICROSCOPIC
Bilirubin Urine: NEGATIVE
Glucose, UA: NEGATIVE mg/dL
Hgb urine dipstick: NEGATIVE
Ketones, ur: NEGATIVE mg/dL
Leukocytes,Ua: NEGATIVE
Nitrite: NEGATIVE
Protein, ur: NEGATIVE mg/dL
RBC / HPF: 0 RBC/hpf (ref 0–5)
Specific Gravity, Urine: 1.013 (ref 1.005–1.030)
pH: 6 (ref 5.0–8.0)

## 2023-09-26 LAB — BASIC METABOLIC PANEL
Anion gap: 10 (ref 5–15)
BUN: 52 mg/dL — ABNORMAL HIGH (ref 8–23)
CO2: 25 mmol/L (ref 22–32)
Calcium: 8.4 mg/dL — ABNORMAL LOW (ref 8.9–10.3)
Chloride: 100 mmol/L (ref 98–111)
Creatinine, Ser: 1.86 mg/dL — ABNORMAL HIGH (ref 0.44–1.00)
GFR, Estimated: 27 mL/min — ABNORMAL LOW (ref >60)
Glucose, Bld: 166 mg/dL — ABNORMAL HIGH (ref 70–99)
Potassium: 3.7 mmol/L (ref 3.5–5.1)
Sodium: 135 mmol/L (ref 135–145)

## 2023-09-26 LAB — CBC
HCT: 33.6 % — ABNORMAL LOW (ref 36.0–46.0)
Hemoglobin: 11.3 g/dL — ABNORMAL LOW (ref 12.0–15.0)
MCH: 29.4 pg (ref 26.0–34.0)
MCHC: 33.6 g/dL (ref 30.0–36.0)
MCV: 87.3 fL (ref 80.0–100.0)
Platelets: 152 10*3/uL (ref 150–400)
RBC: 3.85 MIL/uL — ABNORMAL LOW (ref 3.87–5.11)
RDW: 13 % (ref 11.5–15.5)
WBC: 24.5 10*3/uL — ABNORMAL HIGH (ref 4.0–10.5)
nRBC: 0 % (ref 0.0–0.2)

## 2023-09-26 MED ORDER — ENOXAPARIN SODIUM 40 MG/0.4ML IJ SOSY: 40.0000 mg | PREFILLED_SYRINGE | INTRAMUSCULAR | 0 refills | Status: DC

## 2023-09-26 MED ORDER — HYDROCODONE-ACETAMINOPHEN 5-325 MG PO TABS: 1.0000 | ORAL_TABLET | ORAL | 0 refills | Status: DC | PRN

## 2023-09-26 NOTE — Discharge Instructions (Signed)

## 2023-09-26 NOTE — Progress Notes (Signed)
Subjective: 2 Days Post-Op Procedure(s) (LRB): INTRAMEDULLARY (IM) NAIL INTERTROCHANTERIC (Right) Patient reports pain as mild while laying in bed this AM.  Reports more pain when working with PT. PT and care management to assist with discharge planning.  Current plan is for discharge to SNF. Negative for chest pain and shortness of breath Fever: no Gastrointestinal:Negative for nausea and vomiting Reports she is passing gas this AM.  Objective: Vital signs in last 24 hours: Temp:  [97.6 F (36.4 C)-98.3 F (36.8 C)] 97.8 F (36.6 C) (10/04 0556) Pulse Rate:  [74-84] 81 (10/04 0556) Resp:  [16-18] 18 (10/04 0556) BP: (107-138)/(62-87) 130/87 (10/04 0556) SpO2:  [91 %-95 %] 95 % (10/04 0556)  Intake/Output from previous day:  Intake/Output Summary (Last 24 hours) at 09/26/2023 0743 Last data filed at 09/26/2023 0600 Gross per 24 hour  Intake 460 ml  Output 725 ml  Net -265 ml    Intake/Output this shift: No intake/output data recorded.  Labs: Recent Labs    09/23/23 1643 09/24/23 0327 09/25/23 0450 09/26/23 0429  HGB 14.8 13.4 13.4 11.3*   Recent Labs    09/25/23 0450 09/26/23 0429  WBC 24.0* 24.5*  RBC 4.58 3.85*  HCT 38.9 33.6*  PLT 170 152   Recent Labs    09/25/23 0450 09/26/23 0429  NA 135 135  K 3.8 3.7  CL 101 100  CO2 25 25  BUN 36* 52*  CREATININE 1.31* 1.86*  GLUCOSE 194* 166*  CALCIUM 8.5* 8.4*   No results for input(s): "LABPT", "INR" in the last 72 hours.   EXAM General - Patient is Alert, Appropriate, and Oriented Extremity - ABD soft Neurovascular intact Dorsiflexion/Plantar flexion intact Incision: dressing C/D/I No cellulitis present Compartment soft Swelling in right thigh improved compared to yesterday. Dressing/Incision - clean, dry, no drainage noted to the right hip honeycomb dressing Motor Function - intact, moving foot and toes well on exam.  Abdomen soft with intact bowel sounds.  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   Heart murmur    Hypercholesterolemia    Hypertension    Lymphadenitis    Renal disorder    Skin cancer    Spontaneous ecchymoses    Vitamin D deficiency     Assessment/Plan: 2 Days Post-Op Procedure(s) (LRB): INTRAMEDULLARY (IM) NAIL INTERTROCHANTERIC (Right) Principal Problem:   Fall at home, initial encounter Active Problems:   Essential hypertension   Aortic stenosis, mild   Anemia of chronic disease   CKD stage 4 secondary to hypertension (HCC)   On long term prednisone therapy   Leukocytosis   Displaced intertrochanteric fracture of right femur, initial encounter for closed fracture (HCC)   Chest pain  Estimated body mass index is 30.28 kg/m as calculated from the following:   Height as of this encounter: 4\' 11"  (1.499 m).   Weight as of this encounter: 68 kg. Advance diet Up with therapy D/C IV fluids when tolerating po intake.  Labs and vitals reviewed.  WBC 24.5 this AM, Hg 11.3. I don't see a recent UA, will obtain today for evaluation of elevated WBC No signs of infect to the right hip.  Denies any urinary symptoms this morning. Up with therapy.  Plan likely for discharge to SNF. Denies any abdominal pain, continue to work on BM.  Passing gas this AM.  Following discharge, follow-up with [facility] orthopaedics in 10-14 days for skin check and x-rays  of the right femur. Continue Lovenox daily for 14 days following discharge.  DVT Prophylaxis - TED hose, Lovenox Weight-Bearing as tolerated to right leg  J. Horris Latino, PA-C [facility] Orthopaedic Surgery 09/26/2023, 7:43 AM

## 2023-09-26 NOTE — TOC Progression Note (Signed)
Transition of Care [facility]) - Progression Note    Patient Details  Name: ABBEYGAIL IGOE MRN: [id number] Date of Birth: 09-May-1941  Transition of Care [facility]) CM/SW Contact  Liliana Cline, LCSW Phone Number: 09/26/2023, 10:54 AM  Clinical Narrative:    CSW met with patient at bedside and provided Medicare Care Compare info for current bed offers [facility] and [facility]). Patient states she wants to talk with her friend before making a decision. TOC handoff updated to check in tomorrow. Will need insurance auth.   Expected Discharge Plan: Skilled Nursing Facility Barriers to Discharge: Continued Medical Work up  Expected Discharge Plan and Services       Living arrangements for the past 2 months: Single Family Home                                       Social Determinants of Health (SDOH) Interventions SDOH Screenings   Food Insecurity: No Food Insecurity (09/24/2023)  Housing: Low Risk  (09/24/2023)  Transportation Needs: No Transportation Needs (09/24/2023)  Utilities: Not At Risk (09/24/2023)  Financial Resource Strain: Medium Risk (06/09/2023)   Received from [facility]  Tobacco Use: Low Risk  (09/24/2023)    Readmission Risk Interventions     No data to display

## 2023-09-26 NOTE — Progress Notes (Signed)
A) (by C-G formula based on SCr of 1.86 mg/dL (H)). Liver Function Tests: Recent Labs  Lab 09/24/23 0327  AST 17  ALT 31  ALKPHOS 91  BILITOT 1.1  PROT 5.9*  ALBUMIN 3.2*   No results for input(s): "LIPASE", "AMYLASE" in the last 168 hours. No results for input(s): "AMMONIA" in the last 168 hours. Coagulation Profile: No results for input(s): "INR", "PROTIME" in the last 168 hours. Cardiac Enzymes: Recent Labs  Lab 09/23/23 1643  CKTOTAL <5*   BNP (last 3 results) No results for input(s): "PROBNP" in the last 8760 hours. HbA1C: No results for input(s): "HGBA1C" in the last 72 hours. CBG: No results for input(s): "GLUCAP" in  the last 168 hours. Lipid Profile: No results for input(s): "CHOL", "HDL", "LDLCALC", "TRIG", "CHOLHDL", "LDLDIRECT" in the last 72 hours. Thyroid Function Tests: No results for input(s): "TSH", "T4TOTAL", "FREET4", "T3FREE", "THYROIDAB" in the last 72 hours. Anemia Panel: No results for input(s): "VITAMINB12", "FOLATE", "FERRITIN", "TIBC", "IRON", "RETICCTPCT" in the last 72 hours. Sepsis Labs: No results for input(s): "PROCALCITON", "LATICACIDVEN" in the last 168 hours.  No results found for this or any previous visit (from the past 240 hour(s)).       Radiology Studies: ECHOCARDIOGRAM COMPLETE BUBBLE STUDY  Result Date: 09/25/2023    ECHOCARDIOGRAM REPORT   Patient Name:   Linda Bradley Date of Exam: 09/25/2023 Medical Rec #:  [id number]       Height:       59.0 in Accession #:    [id number]      Weight:       149.9 lb Date of Birth:  1941/01/03      BSA:          1.632 m Patient Age:    81 years        BP:           155/73 mmHg Patient Gender: F               HR:           79 bpm. Exam Location:  [facility] Procedure: 2D Echo, Cardiac Doppler, Color Doppler and Saline Contrast Bubble            Study Indications:     Aortic stenosis                  Chest pain  History:         Patient has prior history of Echocardiogram examinations, most                  recent 03/17/2020. Signs/Symptoms:Murmur; Risk                  Factors:Hypertension. Anxiety.  Sonographer:     Cristela Blue Referring Phys:  [id number] EKTA V PATEL Diagnosing Phys: Mellody Drown Alluri IMPRESSIONS  1. Left ventricular ejection fraction, by estimation, is 65 to 70%. The left ventricle has normal function. The left ventricle has no regional wall motion abnormalities. There is mild left ventricular hypertrophy. Left ventricular diastolic parameters are consistent with Grade I diastolic dysfunction (impaired relaxation).  2. Right ventricular systolic function is normal. The right ventricular size is normal.  3. The mitral valve is abnormal.  Trivial mitral valve regurgitation.  4. 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 No regurgitation.  5. Agitated saline contrast bubble study was negative, with no evidence of any interatrial shunt. FINDINGS  Left Ventricle: Left ventricular ejection fraction, by estimation, is 65 to  A) (by C-G formula based on SCr of 1.86 mg/dL (H)). Liver Function Tests: Recent Labs  Lab 09/24/23 0327  AST 17  ALT 31  ALKPHOS 91  BILITOT 1.1  PROT 5.9*  ALBUMIN 3.2*   No results for input(s): "LIPASE", "AMYLASE" in the last 168 hours. No results for input(s): "AMMONIA" in the last 168 hours. Coagulation Profile: No results for input(s): "INR", "PROTIME" in the last 168 hours. Cardiac Enzymes: Recent Labs  Lab 09/23/23 1643  CKTOTAL <5*   BNP (last 3 results) No results for input(s): "PROBNP" in the last 8760 hours. HbA1C: No results for input(s): "HGBA1C" in the last 72 hours. CBG: No results for input(s): "GLUCAP" in  the last 168 hours. Lipid Profile: No results for input(s): "CHOL", "HDL", "LDLCALC", "TRIG", "CHOLHDL", "LDLDIRECT" in the last 72 hours. Thyroid Function Tests: No results for input(s): "TSH", "T4TOTAL", "FREET4", "T3FREE", "THYROIDAB" in the last 72 hours. Anemia Panel: No results for input(s): "VITAMINB12", "FOLATE", "FERRITIN", "TIBC", "IRON", "RETICCTPCT" in the last 72 hours. Sepsis Labs: No results for input(s): "PROCALCITON", "LATICACIDVEN" in the last 168 hours.  No results found for this or any previous visit (from the past 240 hour(s)).       Radiology Studies: ECHOCARDIOGRAM COMPLETE BUBBLE STUDY  Result Date: 09/25/2023    ECHOCARDIOGRAM REPORT   Patient Name:   Linda Bradley Date of Exam: 09/25/2023 Medical Rec #:  [id number]       Height:       59.0 in Accession #:    [id number]      Weight:       149.9 lb Date of Birth:  1941/01/03      BSA:          1.632 m Patient Age:    81 years        BP:           155/73 mmHg Patient Gender: F               HR:           79 bpm. Exam Location:  [facility] Procedure: 2D Echo, Cardiac Doppler, Color Doppler and Saline Contrast Bubble            Study Indications:     Aortic stenosis                  Chest pain  History:         Patient has prior history of Echocardiogram examinations, most                  recent 03/17/2020. Signs/Symptoms:Murmur; Risk                  Factors:Hypertension. Anxiety.  Sonographer:     Cristela Blue Referring Phys:  [id number] EKTA V PATEL Diagnosing Phys: Mellody Drown Alluri IMPRESSIONS  1. Left ventricular ejection fraction, by estimation, is 65 to 70%. The left ventricle has normal function. The left ventricle has no regional wall motion abnormalities. There is mild left ventricular hypertrophy. Left ventricular diastolic parameters are consistent with Grade I diastolic dysfunction (impaired relaxation).  2. Right ventricular systolic function is normal. The right ventricular size is normal.  3. The mitral valve is abnormal.  Trivial mitral valve regurgitation.  4. 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 No regurgitation.  5. Agitated saline contrast bubble study was negative, with no evidence of any interatrial shunt. FINDINGS  Left Ventricle: Left ventricular ejection fraction, by estimation, is 65 to  PROGRESS NOTE    Linda Bradley  JXB:[id number] DOB: June 03, 1941 DOA: 09/23/2023 PCP: Dortha Kern, MD   Assessment & Plan:   Principal Problem:   Fall at home, initial encounter Active Problems:   Displaced intertrochanteric fracture of right femur, initial encounter for closed fracture [facility])   Chest pain   Essential hypertension   CKD stage 4 secondary to hypertension (HCC)   Anemia of chronic disease   Leukocytosis   On long term prednisone therapy   Aortic stenosis, mild  Assessment and Plan: Fall: at home. PT recs SNF     Displaced intertrochanteric fracture of right femur: s/p right hip intramedullary nail 09/24/23 as per ortho surg. Norco, morphine prn for morphine. On long term steroids    Chest pain: etiology unclear. Hx of right leg DVT. V/Q scan ordered by admitting physician but pt refused citing significant pain from her femur fracture 09/24/23. Pt again refused V/Q scan so it was d/c 09/25/23. Saturating in low 90s on RA. Resolved  Right arm wound: present on admission. Continue w/ wound care.   HTN: continue on coreg, amlodipine  CKDIV: Cr is trending up from day prior. Avoid nephrotoxic   Mild aortic stenosis: continue w/ supportive care   Obesity: BMI 30.2. Would benefit from weight loss   DVT prophylaxis:  lovenox Code Status: full  Family Communication: called pt's son, Onalee Hua, and answered his questions  Disposition Plan: PT recs SNF   Level of care: Progressive Status is: Inpatient Remains inpatient appropriate because: severity of illness    Consultants:  Ortho surg Cardio   Procedures:   Antimicrobials:   Subjective: Pt c/o hip pain.  Objective: Vitals:   09/25/23 1949 09/26/23 0033 09/26/23 0556 09/26/23 0800  BP: 107/67 121/76 130/87 132/69  Pulse: 84 74 81 76  Resp: 18 18 18    Temp: 98.3 F (36.8 C) 98.1 F (36.7 C) 97.8 F (36.6 C) 97.9 F (36.6 C)  TempSrc:  Oral Oral Oral  SpO2: 94% 94% 95% 94%  Weight:      Height:         Intake/Output Summary (Last 24 hours) at 09/26/2023 0906 Last data filed at 09/26/2023 0600 Gross per 24 hour  Intake 460 ml  Output 725 ml  Net -265 ml   Filed Weights   09/23/23 1637 09/24/23 1409  Weight: 68 kg 68 kg    Examination:  General exam: Appears calm & comfortable  Respiratory system: diminished breath sounds  Cardiovascular system: S1 & S2+. No rubs or clicks  Gastrointestinal system: abd is soft, NT, obese & hypoactive bowel sounds Central nervous system: Alert &  oriented. Moves all extremities  Psychiatry: judgement and insight appears at baseline. Appropriate mood and affect    Data Reviewed: I have personally reviewed following labs and imaging studies  CBC: Recent Labs  Lab 09/23/23 1643 09/24/23 0327 09/25/23 0450 09/26/23 0429  WBC 20.2* 22.7* 24.0* 24.5*  HGB 14.8 13.4 13.4 11.3*  HCT 44.9 39.8 38.9 33.6*  MCV 88.6 84.9 84.9 87.3  PLT 214 188 170 152   Basic Metabolic Panel: Recent Labs  Lab 09/23/23 1643 09/24/23 0327 09/25/23 0450 09/26/23 0429  NA 137 137 135 135  K 3.5 3.3* 3.8 3.7  CL 99 101 101 100  CO2 23 25 25 25   GLUCOSE 210* 166* 194* 166*  BUN 41* 40* 36* 52*  CREATININE 1.59* 1.22* 1.31* 1.86*  CALCIUM 9.1 8.7* 8.5* 8.4*   GFR: Estimated Creatinine Clearance: 20.2 mL/min (  A) (by C-G formula based on SCr of 1.86 mg/dL (H)). Liver Function Tests: Recent Labs  Lab 09/24/23 0327  AST 17  ALT 31  ALKPHOS 91  BILITOT 1.1  PROT 5.9*  ALBUMIN 3.2*   No results for input(s): "LIPASE", "AMYLASE" in the last 168 hours. No results for input(s): "AMMONIA" in the last 168 hours. Coagulation Profile: No results for input(s): "INR", "PROTIME" in the last 168 hours. Cardiac Enzymes: Recent Labs  Lab 09/23/23 1643  CKTOTAL <5*   BNP (last 3 results) No results for input(s): "PROBNP" in the last 8760 hours. HbA1C: No results for input(s): "HGBA1C" in the last 72 hours. CBG: No results for input(s): "GLUCAP" in  the last 168 hours. Lipid Profile: No results for input(s): "CHOL", "HDL", "LDLCALC", "TRIG", "CHOLHDL", "LDLDIRECT" in the last 72 hours. Thyroid Function Tests: No results for input(s): "TSH", "T4TOTAL", "FREET4", "T3FREE", "THYROIDAB" in the last 72 hours. Anemia Panel: No results for input(s): "VITAMINB12", "FOLATE", "FERRITIN", "TIBC", "IRON", "RETICCTPCT" in the last 72 hours. Sepsis Labs: No results for input(s): "PROCALCITON", "LATICACIDVEN" in the last 168 hours.  No results found for this or any previous visit (from the past 240 hour(s)).       Radiology Studies: ECHOCARDIOGRAM COMPLETE BUBBLE STUDY  Result Date: 09/25/2023    ECHOCARDIOGRAM REPORT   Patient Name:   Linda Bradley Date of Exam: 09/25/2023 Medical Rec #:  [id number]       Height:       59.0 in Accession #:    [id number]      Weight:       149.9 lb Date of Birth:  1941/01/03      BSA:          1.632 m Patient Age:    81 years        BP:           155/73 mmHg Patient Gender: F               HR:           79 bpm. Exam Location:  [facility] Procedure: 2D Echo, Cardiac Doppler, Color Doppler and Saline Contrast Bubble            Study Indications:     Aortic stenosis                  Chest pain  History:         Patient has prior history of Echocardiogram examinations, most                  recent 03/17/2020. Signs/Symptoms:Murmur; Risk                  Factors:Hypertension. Anxiety.  Sonographer:     Cristela Blue Referring Phys:  [id number] EKTA V PATEL Diagnosing Phys: Mellody Drown Alluri IMPRESSIONS  1. Left ventricular ejection fraction, by estimation, is 65 to 70%. The left ventricle has normal function. The left ventricle has no regional wall motion abnormalities. There is mild left ventricular hypertrophy. Left ventricular diastolic parameters are consistent with Grade I diastolic dysfunction (impaired relaxation).  2. Right ventricular systolic function is normal. The right ventricular size is normal.  3. The mitral valve is abnormal.  Trivial mitral valve regurgitation.  4. 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 No regurgitation.  5. Agitated saline contrast bubble study was negative, with no evidence of any interatrial shunt. FINDINGS  Left Ventricle: Left ventricular ejection fraction, by estimation, is 65 to

## 2023-09-26 NOTE — Consult Note (Signed)
WOC Nurse Consult Note: Reason for Consult: Consult requested for right elbow skin tear.  Performed remotely after review of progress notes. Pt fell prior to admission and obtained a full thickness skin tear to the affected area which was dressed by EMS.  Dressing procedure/placement/frequency: Topical treatment orders provided for bedside nurses to perform as follows to assist with moist healing: Soak dressing to right elbow with NS to assist with removal, then cover with double-folded Xeroform gauze and ABD pad and kerlex Q day.  Please re-consult if further assistance is needed.  Thank-you,  Cammie Mcgee MSN, RN, CWOCN, [place], CNS [phone]

## 2023-09-26 NOTE — Progress Notes (Signed)
Physical Therapy Treatment Patient Details Name: Linda Bradley MRN: [id number] DOB: 1941/10/09 Today's Date: 09/26/2023   History of Present Illness Patient is a 82 y.o. 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 Current MD assessment: s/p R hip IM nail secondary to fracture from recent fall.    PT Comments  Pt was pleasant and motivated to participate during the session and put forth good effort throughout although pt remained limited by RLE pain despite being pre-medicated.  Pt required physical assistance with all functional tasks with frequent short therapeutic rest breaks for pain control.  Once in standing pt only able to take a max of 2-3 small shuffling steps with heavy lean on the RW for support.  Pt reported some SOB with the effort that resolved quickly once in sitting with SpO2 and HR WNL on room air throughout session.  Pt will benefit from continued PT services upon discharge to safely address deficits listed in patient problem list for decreased caregiver assistance and eventual return to PLOF.      If plan is discharge home, recommend the following: A lot of help with bathing/dressing/bathroom;Help with stairs or ramp for entrance;Assist for transportation;Assistance with cooking/housework;A lot of help with walking and/or transfers   Can travel by private vehicle     No  Equipment Recommendations  Other (comment) (TBD at next venue of care)    Recommendations for Other Services       Precautions / Restrictions Precautions Precautions: Fall Restrictions Weight Bearing Restrictions: Yes RLE Weight Bearing: Weight bearing as tolerated     Mobility  Bed Mobility Overal bed mobility: Needs Assistance Bed  Mobility: Supine to Sit, Sit to Supine, Rolling Rolling: Mod assist, Used rails   Supine to sit: +2 for physical assistance, Mod assist Sit to supine: +2 for physical assistance, Mod assist   General bed mobility comments: Heavy assist for RLE but pt able to advance the RLE minimally with leg unweighted by PT    Transfers Overall transfer level: Needs assistance Equipment used: Rolling walker (2 wheels) Transfers: Sit to/from Stand Sit to Stand: Mod assist, From elevated surface           General transfer comment: Mod verbal cues for general sequencing and to encourage RLE use as tolerated    Ambulation/Gait Ambulation/Gait assistance: Mod assist Gait Distance (Feet): 1 Feet Assistive device: Rolling walker (2 wheels) Gait Pattern/deviations: Shuffle, Trunk flexed, Step-to pattern, Antalgic, Decreased stance time - right, Decreased step length - left Gait velocity: decreased     General Gait Details: Pt only able to take 2-3 very small, effortful, shuffling steps at the EOB before fatiguing and needing to return to sitting; heavy trunk flexion and lean on the RW with cues given for PWB sequencing to assist with pain control   Stairs             Wheelchair Mobility     Tilt Bed    Modified Rankin (Stroke Patients Only)       Balance Overall balance assessment: Needs assistance Sitting-balance support: Bilateral upper extremity supported, Feet unsupported Sitting balance-Leahy Scale: Good     Standing balance support: Bilateral upper extremity supported, During functional activity, Reliant on assistive device for balance Standing balance-Leahy Scale: Poor  Cognition Arousal: Alert Behavior During Therapy: WFL for tasks assessed/performed Overall Cognitive Status: Within Functional Limits for tasks assessed                                          Exercises Total Joint Exercises Long Arc Quad:  AROM, Strengthening, Both, 10 reps Knee Flexion: AROM, Strengthening, Both, 10 reps Other Exercises Other Exercises: Pt education provided on physiological benefits of activity    General Comments        Pertinent Vitals/Pain Pain Assessment Pain Assessment: 0-10 Pain Score: 5  Pain Location: R hip Pain Descriptors / Indicators: Grimacing, Guarding, Sore Pain Intervention(s): Repositioned, Premedicated before session, Monitored during session    Home Living                          Prior Function            PT Goals (current goals can now be found in the care plan section) Progress towards PT goals: Not progressing toward goals - comment (limited by pain and functional weakness)    Frequency    BID      PT Plan      Co-evaluation              AM-PAC PT "6 Clicks" Mobility   Outcome Measure  Help needed turning from your back to your side while in a flat bed without using bedrails?: A Lot Help needed moving from lying on your back to sitting on the side of a flat bed without using bedrails?: A Lot Help needed moving to and from a bed to a chair (including a wheelchair)?: A Lot Help needed standing up from a chair using your arms (e.g., wheelchair or bedside chair)?: A Lot Help needed to walk in hospital room?: Total Help needed climbing 3-5 steps with a railing? : Total 6 Click Score: 10    End of Session Equipment Utilized During Treatment: Gait belt Activity Tolerance: Patient limited by pain Patient left: in bed;with bed alarm set;with call bell/phone within reach;with SCD's reapplied Nurse Communication: Mobility status PT Visit Diagnosis: Unsteadiness on feet (R26.81);Other abnormalities of gait and mobility (R26.89);Muscle weakness (generalized) (M62.81);Difficulty in walking, not elsewhere classified (R26.2);Pain Pain - Right/Left: Right Pain - part of body: Hip     Time: 1610-9604 PT Time Calculation (min) (ACUTE ONLY): 29  min  Charges:    $Therapeutic Activity: 23-37 mins PT General Charges $$ ACUTE PT VISIT: 1 Visit                     D. Scott Declynn Lopresti PT, DPT 09/26/23, 11:59 AM

## 2023-09-26 NOTE — Care Management Important Message (Signed)
Important Message  Patient Details  Name: Linda Bradley MRN: [id number] Date of Birth: 1941-04-09   Important Message Given:  Yes - Medicare IM     Johnell Comings 09/26/2023, 2:33 PM

## 2023-09-26 NOTE — Progress Notes (Signed)
PT Cancellation Note  Patient Details Name: Linda Bradley MRN: [id number] DOB: 1941-08-21   Cancelled Treatment:    Reason Eval/Treat Not Completed: Patient declined to participate with PT services secondary to RLE pain, nursing notified.  Will attempt to see pt at a future date/time as medically appropriate.    Ovidio Hanger PT, DPT 09/26/23, 9:16 AM

## 2023-09-26 NOTE — Progress Notes (Signed)
Physical Therapy Treatment Patient Details Name: Linda Bradley MRN: [id number] DOB: 06-23-1941 Today's Date: 09/26/2023   History of Present Illness Patient is a 82 y.o. 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 Current MD assessment: s/p R hip IM nail secondary to fracture from recent fall.    PT Comments  Pt was pleasant and motivated to participate during the session and put forth good effort throughout. Pt found supine in bed upton entry. Pt continues to need +2 assist with bed mobility. She was able to perform STS from elevated EOB with RW and Mod A. Once up, pt able to take shuffling steps, cues for WBAT sequencing through RLE with RW. Pt spent ~1 min overall in standing position before needing to sit secondary to pain. Upon sitting back down pt having labored breathing, Hr and SpO2 reading WFL. Pt will benefit from continued PT services upon discharge to safely address deficits listed in patient problem list for decreased caregiver assistance and eventual return to PLOF.    If plan is discharge home, recommend the following: A lot of help with bathing/dressing/bathroom;Help with stairs or ramp for entrance;Assist for transportation;Assistance with cooking/housework;A lot of help with walking and/or transfers   Can travel by private vehicle     No  Equipment Recommendations  Other (comment) (TBD at next venue of care)    Recommendations for Other Services       Precautions / Restrictions Precautions Precautions: Fall Restrictions Weight Bearing Restrictions: Yes RLE Weight Bearing: Weight bearing as tolerated     Mobility  Bed Mobility Overal bed mobility: Needs Assistance Bed Mobility: Supine to Sit, Sit to Supine,  Rolling Rolling: Mod assist, Used rails   Supine to sit: +2 for physical assistance, Mod assist Sit to supine: +2 for physical assistance, Mod assist   General bed mobility comments: Able to sit up form elevated HOB with bed rails, PT heavy assist with RLE and light assist for trunk control    Transfers Overall transfer level: Needs assistance Equipment used: Rolling walker (2 wheels) Transfers: Sit to/from Stand Sit to Stand: Mod assist, From elevated surface           General transfer comment: verbal cues for foot and hand placement, and general sequencing.    Ambulation/Gait Ambulation/Gait assistance: Mod assist Gait Distance (Feet): 2 Feet Assistive device: Rolling walker (2 wheels) Gait Pattern/deviations: Shuffle, Trunk flexed, Step-to pattern, Antalgic, Decreased stance time - right, Decreased step length - left Gait velocity: decreased     General Gait Details: Pt ablt to take shuffling steps at side of bed. overall very effortful, pt cued to breathe steady as she tend to hole breath furing RLE movement. Able to stand for a total of ~75min mefore needing to sit back down. Good use of RW to off load RLE when shuffling.   Stairs             Wheelchair Mobility     Tilt Bed    Modified Rankin (Stroke Patients Only)       Balance Overall balance assessment: Needs assistance Sitting-balance support: Bilateral upper extremity supported, Feet unsupported Sitting balance-Leahy Scale: Good     Standing balance support: Bilateral upper extremity supported, During functional activity, Reliant on assistive device for balance Standing balance-Leahy Scale: Poor  Cognition Arousal: Alert Behavior During Therapy: WFL for tasks assessed/performed Overall Cognitive Status: Within Functional Limits for tasks assessed                                          Exercises Other Exercises Other Exercises:  education on performing exercises when PT not present, benefits of frequent exercises to maintain funcitonal ability.    General Comments        Pertinent Vitals/Pain Pain Assessment Pain Score: 7  Pain Location: R hip Pain Descriptors / Indicators: Grimacing, Guarding, Sore Pain Intervention(s): Monitored during session, Limited activity within patient's tolerance    Home Living                          Prior Function            PT Goals (current goals can now be found in the care plan section) Progress towards PT goals: Not progressing toward goals - comment (limited by severe pain, general LE weakness)    Frequency    BID      PT Plan      Co-evaluation              AM-PAC PT "6 Clicks" Mobility   Outcome Measure  Help needed turning from your back to your side while in a flat bed without using bedrails?: A Lot Help needed moving from lying on your back to sitting on the side of a flat bed without using bedrails?: A Lot Help needed moving to and from a bed to a chair (including a wheelchair)?: A Lot Help needed standing up from a chair using your arms (e.g., wheelchair or bedside chair)?: A Lot Help needed to walk in hospital room?: Total Help needed climbing 3-5 steps with a railing? : Total 6 Click Score: 10    End of Session Equipment Utilized During Treatment: Gait belt Activity Tolerance: Patient limited by pain Patient left: in bed;with bed alarm set;with call bell/phone within reach;with nursing/sitter in room Nurse Communication: Mobility status PT Visit Diagnosis: Unsteadiness on feet (R26.81);Other abnormalities of gait and mobility (R26.89);Muscle weakness (generalized) (M62.81);Difficulty in walking, not elsewhere classified (R26.2);Pain Pain - Right/Left: Right Pain - part of body: Hip     Time: 5643-3295 PT Time Calculation (min) (ACUTE ONLY): 18 min  Charges:                          Cecile Sheerer, SPT 09/26/23, 5:07  PM

## 2023-09-27 DIAGNOSIS — S7291XA Unspecified fracture of right femur, initial encounter for closed fracture: Secondary | ICD-10-CM | POA: Diagnosis not present

## 2023-09-27 LAB — CBC
HCT: 33 % — ABNORMAL LOW (ref 36.0–46.0)
Hemoglobin: 11.1 g/dL — ABNORMAL LOW (ref 12.0–15.0)
MCH: 29.4 pg (ref 26.0–34.0)
MCHC: 33.6 g/dL (ref 30.0–36.0)
MCV: 87.3 fL (ref 80.0–100.0)
Platelets: 166 10*3/uL (ref 150–400)
RBC: 3.78 MIL/uL — ABNORMAL LOW (ref 3.87–5.11)
RDW: 13 % (ref 11.5–15.5)
WBC: 20.1 10*3/uL — ABNORMAL HIGH (ref 4.0–10.5)
nRBC: 0 % (ref 0.0–0.2)

## 2023-09-27 LAB — BASIC METABOLIC PANEL
Anion gap: 8 (ref 5–15)
BUN: 49 mg/dL — ABNORMAL HIGH (ref 8–23)
CO2: 25 mmol/L (ref 22–32)
Calcium: 8.1 mg/dL — ABNORMAL LOW (ref 8.9–10.3)
Chloride: 99 mmol/L (ref 98–111)
Creatinine, Ser: 1.41 mg/dL — ABNORMAL HIGH (ref 0.44–1.00)
GFR, Estimated: 38 mL/min — ABNORMAL LOW (ref >60)
Glucose, Bld: 119 mg/dL — ABNORMAL HIGH (ref 70–99)
Potassium: 4 mmol/L (ref 3.5–5.1)
Sodium: 132 mmol/L — ABNORMAL LOW (ref 135–145)

## 2023-09-27 NOTE — Plan of Care (Signed)

## 2023-09-27 NOTE — Progress Notes (Signed)
Physical Therapy Treatment Patient Details Name: Linda Bradley MRN: [id number] DOB: 27-Mar-1941 Today's Date: 09/27/2023   History of Present Illness Patient is a 82 y.o. 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 Current MD assessment: s/p R hip IM nail secondary to fracture from recent fall.    PT Comments  Pt is making progress with bed mobility performing with Mod A.  Pt performed pregait training and sit<>stand transfers with Min A.  Pt continues to have difficulty transferring weight to R LE to take a step reporting pain and demonstrating increased fear .  Pt was able to perform a squat-pivot tranfers with ModA.  Continued PT will assist pt towards greater safety and confidence with mobility.   If plan is discharge home, recommend the following: A lot of help with bathing/dressing/bathroom;Help with stairs or ramp for entrance;Assist for transportation;Assistance with cooking/housework;A lot of help with walking and/or transfers   Can travel by private vehicle     No  Equipment Recommendations       Recommendations for Other Services       Precautions / Restrictions Precautions Precautions: Fall Restrictions Weight Bearing Restrictions: No RLE Weight Bearing: Weight bearing as tolerated     Mobility  Bed Mobility Overal bed mobility: Needs Assistance Bed Mobility: Supine to Sit, Sit to Supine, Rolling Rolling: Mod assist, Used rails   Supine to sit: Mod assist Sit to supine: Mod assist        Transfers Overall transfer level: Needs assistance Equipment used: Standard walker Transfers: Sit to/from Stand, Bed to chair/wheelchair/BSC Sit to Stand: From elevated surface     Squat pivot transfers: Mod assist      General transfer comment: verbal cues for foot and hand placement, and general sequencing.    Ambulation/Gait               General Gait Details: side stepped about 1 foot but unable to weight shift sufficiently to step.  Performed squat pivot transfer to recliner.   Stairs             Wheelchair Mobility     Tilt Bed    Modified Rankin (Stroke Patients Only)       Balance Overall balance assessment: Needs assistance Sitting-balance support: Feet supported, No upper extremity supported Sitting balance-Leahy Scale: Good     Standing balance support: Bilateral upper extremity supported, During functional activity, Reliant on assistive device for balance   Standing balance comment: Unable to stand or balance without assist or RW                            Cognition Arousal: Alert   Overall Cognitive Status: Within Functional Limits for tasks assessed                                          Exercises      General Comments        Pertinent Vitals/Pain Pain Assessment Pain Assessment: Faces Faces Pain Scale: Hurts whole lot Pain Location: R hip Pain Descriptors / Indicators: Grimacing, Guarding, Sore Pain Intervention(s): Limited activity within patient's tolerance, Monitored during session, Premedicated before session    Home Living  Prior Function            PT Goals (current goals can now be found in the care plan section) Acute Rehab PT Goals Patient Stated Goal: walk again, get home PT Goal Formulation: With patient Time For Goal Achievement: 10/08/23 Potential to Achieve Goals: Fair Progress towards PT goals: Progressing toward goals    Frequency    BID      PT Plan      Co-evaluation              AM-PAC PT "6 Clicks" Mobility   Outcome Measure  Help needed turning from your back to your side while in a flat bed without using bedrails?: A Lot Help  needed moving from lying on your back to sitting on the side of a flat bed without using bedrails?: A Lot Help needed moving to and from a bed to a chair (including a wheelchair)?: A Lot Help needed standing up from a chair using your arms (e.g., wheelchair or bedside chair)?: A Lot Help needed to walk in hospital room?: Total Help needed climbing 3-5 steps with a railing? : Total 6 Click Score: 10    End of Session Equipment Utilized During Treatment: Gait belt Activity Tolerance: Patient limited by pain Patient left: with call bell/phone within reach;with nursing/sitter in room;in chair;with chair alarm set Nurse Communication: Mobility status PT Visit Diagnosis: Unsteadiness on feet (R26.81);Other abnormalities of gait and mobility (R26.89);Muscle weakness (generalized) (M62.81);Difficulty in walking, not elsewhere classified (R26.2);Pain Pain - Right/Left: Right Pain - part of body: Hip     Time: 1126-1150 PT Time Calculation (min) (ACUTE ONLY): 24 min  Charges:    $Therapeutic Activity: 23-37 mins PT General Charges $$ ACUTE PT VISIT: 1 Visit                     Hortencia Conradi, PTA  09/27/23, 12:11 PM

## 2023-09-27 NOTE — Progress Notes (Signed)
Physical Therapy Treatment Patient Details Name: Linda Bradley MRN: [id number] DOB: 1941/12/04 Today's Date: 09/27/2023   History of Present Illness Patient is a 82 y.o. 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 Current MD assessment: s/p R hip IM nail secondary to fracture from recent fall.    PT Comments  Overall pt has progressed with transfers, performing stand pivot with RW Mod A and bed moblity sit>supine with Max A and +2 for scooting to [facility].  Pt continues to be limited due to R hip pain and fatigue.  Continued PT will assist pt towards greater strength, safety, and activity tolerance for mobility.    If plan is discharge home, recommend the following: A lot of help with bathing/dressing/bathroom;Help with stairs or ramp for entrance;Assist for transportation;Assistance with cooking/housework;A lot of help with walking and/or transfers   Can travel by private vehicle     No  Equipment Recommendations       Recommendations for Other Services       Precautions / Restrictions Precautions Precautions: Fall Restrictions Weight Bearing Restrictions: No RLE Weight Bearing: Weight bearing as tolerated     Mobility  Bed Mobility Overal bed mobility: Needs Assistance Bed Mobility: Sit to Supine, Sit to Sidelying Rolling: Mod assist, Used rails   Supine to sit: Mod assist Sit to supine: Max assist Sit to sidelying: Mod assist General bed mobility comments: +2 to scoot up to head of bed.    Transfers Overall transfer level: Needs assistance Equipment used: Rolling walker (2 wheels) Transfers: Sit to/from Stand, Bed to chair/wheelchair/BSC Sit to Stand: Mod assist   Step pivot transfers: Mod assist Squat pivot  transfers: Mod assist     General transfer comment: verbal cues for foot and hand placement, and general sequencing.    Ambulation/Gait               General Gait Details: side stepped about 1 foot but unable to weight shift sufficiently to step.  Performed stand pivot transfer from recliner to bed.   Stairs             Wheelchair Mobility     Tilt Bed    Modified Rankin (Stroke Patients Only)       Balance Overall balance assessment: Needs assistance Sitting-balance support: Feet supported, No upper extremity supported Sitting balance-Leahy Scale: Good     Standing balance support: Bilateral upper extremity supported, During functional activity, Reliant on assistive device for balance Standing balance-Leahy Scale: Poor Standing balance comment: Unable to stand or balance without assist or RW                            Cognition Arousal: Alert Behavior During Therapy: WFL for tasks assessed/performed Overall Cognitive Status: Within Functional Limits for tasks assessed                                          Exercises      General Comments        Pertinent Vitals/Pain Pain Assessment Pain Assessment: Faces Faces Pain Scale: Hurts whole lot Pain Location: R hip Pain Descriptors / Indicators: Grimacing, Guarding, Sore Pain Intervention(s): Limited activity within patient's tolerance,  Monitored during session, Premedicated before session    Home Living                          Prior Function            PT Goals (current goals can now be found in the care plan section) Acute Rehab PT Goals Patient Stated Goal: walk again, get home PT Goal Formulation: With patient Time For Goal Achievement: 10/08/23 Potential to Achieve Goals: Fair Progress towards PT goals: Progressing toward goals    Frequency    BID      PT Plan      Co-evaluation              AM-PAC PT "6 Clicks" Mobility    Outcome Measure  Help needed turning from your back to your side while in a flat bed without using bedrails?: A Lot Help needed moving from lying on your back to sitting on the side of a flat bed without using bedrails?: A Lot Help needed moving to and from a bed to a chair (including a wheelchair)?: A Lot Help needed standing up from a chair using your arms (e.g., wheelchair or bedside chair)?: A Lot Help needed to walk in hospital room?: Total Help needed climbing 3-5 steps with a railing? : Total 6 Click Score: 10    End of Session Equipment Utilized During Treatment: Gait belt Activity Tolerance: Patient limited by pain Patient left: with call bell/phone within reach;with nursing/sitter in room;in bed;with bed alarm set Nurse Communication: Mobility status PT Visit Diagnosis: Unsteadiness on feet (R26.81);Other abnormalities of gait and mobility (R26.89);Muscle weakness (generalized) (M62.81);Difficulty in walking, not elsewhere classified (R26.2);Pain Pain - Right/Left: Right Pain - part of body: Hip     Time: 0102-7253 PT Time Calculation (min) (ACUTE ONLY): 12 min  Charges:    $Therapeutic Activity: 8-22 mins PT General Charges $$ ACUTE PT VISIT: 1 Visit                     Hortencia Conradi, PTA  09/27/23, 2:33 PM

## 2023-09-27 NOTE — Progress Notes (Signed)
Subjective: 3 Days Post-Op Procedure(s) (LRB): INTRAMEDULLARY (IM) NAIL INTERTROCHANTERIC (Right) Patient reports pain as mild while laying in bed this AM.  Reports more pain when working with PT. PT and care management to assist with discharge planning.  Current plan is for discharge to SNF. Negative for chest pain and shortness of breath Fever: no Gastrointestinal:Negative for nausea and vomiting Reports she is passing gas this AM.  Objective: Vital signs in last 24 hours: Temp:  [97.7 F (36.5 C)-98.8 F (37.1 C)] 98 F (36.7 C) (10/05 0435) Pulse Rate:  [73-89] 76 (10/05 0435) Resp:  [13-18] 16 (10/05 0435) BP: (114-134)/(59-71) 134/69 (10/05 0435) SpO2:  [93 %-95 %] 95 % (10/05 0435)  Intake/Output from previous day:  Intake/Output Summary (Last 24 hours) at 09/27/2023 0726 Last data filed at 09/27/2023 0435 Gross per 24 hour  Intake --  Output 700 ml  Net -700 ml    Intake/Output this shift: No intake/output data recorded.  Labs: Recent Labs    09/25/23 0450 09/26/23 0429 09/27/23 0445  HGB 13.4 11.3* 11.1*   Recent Labs    09/26/23 0429 09/27/23 0445  WBC 24.5* 20.1*  RBC 3.85* 3.78*  HCT 33.6* 33.0*  PLT 152 166   Recent Labs    09/26/23 0429 09/27/23 0445  NA 135 132*  K 3.7 4.0  CL 100 99  CO2 25 25  BUN 52* 49*  CREATININE 1.86* 1.41*  GLUCOSE 166* 119*  CALCIUM 8.4* 8.1*   No results for input(s): "LABPT", "INR" in the last 72 hours.   EXAM General - Patient is Alert, Appropriate, and Oriented Extremity - ABD soft Neurovascular intact Dorsiflexion/Plantar flexion intact Incision: dressing C/D/I No cellulitis present Compartment soft Swelling in right thigh improved compared to yesterday. Dressing/Incision - clean, dry, no drainage noted to the right hip honeycomb dressing Motor Function - intact, moving foot and toes well on exam.  Abdomen soft with intact bowel sounds.  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   Heart murmur    Hypercholesterolemia    Hypertension    Lymphadenitis    Renal disorder    Skin cancer    Spontaneous ecchymoses    Vitamin D deficiency     Assessment/Plan: 3 Days Post-Op Procedure(s) (LRB): INTRAMEDULLARY (IM) NAIL INTERTROCHANTERIC (Right) Principal Problem:   Fall at home, initial encounter Active Problems:   Essential hypertension   Aortic stenosis, mild   Anemia of chronic disease   CKD stage 4 secondary to hypertension (HCC)   On long term prednisone therapy   Leukocytosis   Displaced intertrochanteric fracture of right femur, initial encounter for closed fracture (HCC)   Chest pain  Estimated body mass index is 30.28 kg/m as calculated from the following:   Height as of this encounter: 4\' 11"  (1.499 m).   Weight as of this encounter: 68 kg. Advance diet Up with therapy D/C IV fluids when tolerating po intake.  Labs and vitals reviewed.  WBC 20.1 this AM, Hg 11.1. I don't see a recent UA, will obtain today for evaluation of elevated WBC No signs of infect to the right hip.  Denies any urinary symptoms this morning. Up with therapy.  Plan likely for discharge to SNF. Denies any abdominal pain, continue to work on BM.  Passing gas this AM.  Following discharge, follow-up with [facility] orthopaedics in 10-14 days for skin check and x-rays of the right femur.  Continue Lovenox daily for 14 days following discharge.  DVT Prophylaxis - TED hose, Lovenox Weight-Bearing as tolerated to right leg  Dedra Skeens PA-C [facility] 09/27/2023, 7:26 AM

## 2023-09-27 NOTE — Plan of Care (Signed)
  Problem: Education: Goal: Knowledge of General Education information will improve Description Including pain rating scale, medication(s)/side effects and non-pharmacologic comfort measures Outcome: Progressing   Problem: Activity: Goal: Ability to ambulate and perform ADLs will improve Outcome: Progressing   Problem: Pain Management: Goal: Pain level will decrease Outcome: Progressing

## 2023-09-27 NOTE — Progress Notes (Signed)
CREATININE 1.59* 1.22* 1.31* 1.86* 1.41*  CALCIUM 9.1 8.7* 8.5* 8.4* 8.1*   GFR: Estimated Creatinine Clearance: 26.7 mL/min (A) (by C-G formula based on SCr of 1.41 mg/dL (H)). Liver Function Tests: Recent Labs  Lab 09/24/23 0327  AST 17  ALT 31  ALKPHOS 91  BILITOT 1.1  PROT 5.9*  ALBUMIN 3.2*   No results for input(s): "LIPASE", "AMYLASE" in the last 168 hours. No results for input(s): "AMMONIA" in the last 168 hours. Coagulation Profile: No results for input(s): "INR", "PROTIME" in the last 168 hours. Cardiac Enzymes: Recent Labs  Lab 09/23/23 1643  CKTOTAL <5*   BNP (last 3 results) No results for input(s): "PROBNP" in the last 8760  hours. HbA1C: No results for input(s): "HGBA1C" in the last 72 hours. CBG: No results for input(s): "GLUCAP" in the last 168 hours. Lipid Profile: No results for input(s): "CHOL", "HDL", "LDLCALC", "TRIG", "CHOLHDL", "LDLDIRECT" in the last 72 hours. Thyroid Function Tests: No results for input(s): "TSH", "T4TOTAL", "FREET4", "T3FREE", "THYROIDAB" in the last 72 hours. Anemia Panel: No results for input(s): "VITAMINB12", "FOLATE", "FERRITIN", "TIBC", "IRON", "RETICCTPCT" in the last 72 hours. Sepsis Labs: No results for input(s): "PROCALCITON", "LATICACIDVEN" in the last 168 hours.  No results found for this or any previous visit (from the past 240 hour(s)).       Radiology Studies: ECHOCARDIOGRAM COMPLETE BUBBLE STUDY  Result Date: 09/25/2023    ECHOCARDIOGRAM REPORT   Patient Name:   Linda Bradley Date of Exam: 09/25/2023 Medical Rec #:  [id number]       Height:       59.0 in Accession #:    [id number]      Weight:       149.9 lb Date of Birth:  1941/08/11      BSA:          1.632 m Patient Age:    82 years        BP:           155/73 mmHg Patient Gender: F               HR:           79 bpm. Exam Location:  [facility] Procedure: 2D Echo, Cardiac Doppler, Color Doppler and Saline Contrast Bubble            Study Indications:     Aortic stenosis                  Chest pain  History:         Patient has prior history of Echocardiogram examinations, most                  recent 03/17/2020. Signs/Symptoms:Murmur; Risk                  Factors:Hypertension. Anxiety.  Sonographer:     Cristela Blue Referring Phys:  [id number] EKTA V PATEL Diagnosing Phys: Mellody Drown Alluri IMPRESSIONS  1. Left ventricular ejection fraction, by estimation, is 65 to 70%. The left ventricle has normal function. The left ventricle has no regional wall motion abnormalities. There is mild left ventricular hypertrophy. Left ventricular diastolic parameters are consistent with Grade I diastolic dysfunction (impaired relaxation).  2.  Right ventricular systolic function is normal. The right ventricular size is normal.  3. The mitral valve is abnormal. Trivial mitral valve regurgitation.  4. 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 No regurgitation.  5. Agitated saline contrast bubble study was  CREATININE 1.59* 1.22* 1.31* 1.86* 1.41*  CALCIUM 9.1 8.7* 8.5* 8.4* 8.1*   GFR: Estimated Creatinine Clearance: 26.7 mL/min (A) (by C-G formula based on SCr of 1.41 mg/dL (H)). Liver Function Tests: Recent Labs  Lab 09/24/23 0327  AST 17  ALT 31  ALKPHOS 91  BILITOT 1.1  PROT 5.9*  ALBUMIN 3.2*   No results for input(s): "LIPASE", "AMYLASE" in the last 168 hours. No results for input(s): "AMMONIA" in the last 168 hours. Coagulation Profile: No results for input(s): "INR", "PROTIME" in the last 168 hours. Cardiac Enzymes: Recent Labs  Lab 09/23/23 1643  CKTOTAL <5*   BNP (last 3 results) No results for input(s): "PROBNP" in the last 8760  hours. HbA1C: No results for input(s): "HGBA1C" in the last 72 hours. CBG: No results for input(s): "GLUCAP" in the last 168 hours. Lipid Profile: No results for input(s): "CHOL", "HDL", "LDLCALC", "TRIG", "CHOLHDL", "LDLDIRECT" in the last 72 hours. Thyroid Function Tests: No results for input(s): "TSH", "T4TOTAL", "FREET4", "T3FREE", "THYROIDAB" in the last 72 hours. Anemia Panel: No results for input(s): "VITAMINB12", "FOLATE", "FERRITIN", "TIBC", "IRON", "RETICCTPCT" in the last 72 hours. Sepsis Labs: No results for input(s): "PROCALCITON", "LATICACIDVEN" in the last 168 hours.  No results found for this or any previous visit (from the past 240 hour(s)).       Radiology Studies: ECHOCARDIOGRAM COMPLETE BUBBLE STUDY  Result Date: 09/25/2023    ECHOCARDIOGRAM REPORT   Patient Name:   Linda Bradley Date of Exam: 09/25/2023 Medical Rec #:  [id number]       Height:       59.0 in Accession #:    [id number]      Weight:       149.9 lb Date of Birth:  1941/08/11      BSA:          1.632 m Patient Age:    82 years        BP:           155/73 mmHg Patient Gender: F               HR:           79 bpm. Exam Location:  [facility] Procedure: 2D Echo, Cardiac Doppler, Color Doppler and Saline Contrast Bubble            Study Indications:     Aortic stenosis                  Chest pain  History:         Patient has prior history of Echocardiogram examinations, most                  recent 03/17/2020. Signs/Symptoms:Murmur; Risk                  Factors:Hypertension. Anxiety.  Sonographer:     Cristela Blue Referring Phys:  [id number] EKTA V PATEL Diagnosing Phys: Mellody Drown Alluri IMPRESSIONS  1. Left ventricular ejection fraction, by estimation, is 65 to 70%. The left ventricle has normal function. The left ventricle has no regional wall motion abnormalities. There is mild left ventricular hypertrophy. Left ventricular diastolic parameters are consistent with Grade I diastolic dysfunction (impaired relaxation).  2.  Right ventricular systolic function is normal. The right ventricular size is normal.  3. The mitral valve is abnormal. Trivial mitral valve regurgitation.  4. 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 No regurgitation.  5. Agitated saline contrast bubble study was  CREATININE 1.59* 1.22* 1.31* 1.86* 1.41*  CALCIUM 9.1 8.7* 8.5* 8.4* 8.1*   GFR: Estimated Creatinine Clearance: 26.7 mL/min (A) (by C-G formula based on SCr of 1.41 mg/dL (H)). Liver Function Tests: Recent Labs  Lab 09/24/23 0327  AST 17  ALT 31  ALKPHOS 91  BILITOT 1.1  PROT 5.9*  ALBUMIN 3.2*   No results for input(s): "LIPASE", "AMYLASE" in the last 168 hours. No results for input(s): "AMMONIA" in the last 168 hours. Coagulation Profile: No results for input(s): "INR", "PROTIME" in the last 168 hours. Cardiac Enzymes: Recent Labs  Lab 09/23/23 1643  CKTOTAL <5*   BNP (last 3 results) No results for input(s): "PROBNP" in the last 8760  hours. HbA1C: No results for input(s): "HGBA1C" in the last 72 hours. CBG: No results for input(s): "GLUCAP" in the last 168 hours. Lipid Profile: No results for input(s): "CHOL", "HDL", "LDLCALC", "TRIG", "CHOLHDL", "LDLDIRECT" in the last 72 hours. Thyroid Function Tests: No results for input(s): "TSH", "T4TOTAL", "FREET4", "T3FREE", "THYROIDAB" in the last 72 hours. Anemia Panel: No results for input(s): "VITAMINB12", "FOLATE", "FERRITIN", "TIBC", "IRON", "RETICCTPCT" in the last 72 hours. Sepsis Labs: No results for input(s): "PROCALCITON", "LATICACIDVEN" in the last 168 hours.  No results found for this or any previous visit (from the past 240 hour(s)).       Radiology Studies: ECHOCARDIOGRAM COMPLETE BUBBLE STUDY  Result Date: 09/25/2023    ECHOCARDIOGRAM REPORT   Patient Name:   Linda Bradley Date of Exam: 09/25/2023 Medical Rec #:  [id number]       Height:       59.0 in Accession #:    [id number]      Weight:       149.9 lb Date of Birth:  1941/08/11      BSA:          1.632 m Patient Age:    82 years        BP:           155/73 mmHg Patient Gender: F               HR:           79 bpm. Exam Location:  [facility] Procedure: 2D Echo, Cardiac Doppler, Color Doppler and Saline Contrast Bubble            Study Indications:     Aortic stenosis                  Chest pain  History:         Patient has prior history of Echocardiogram examinations, most                  recent 03/17/2020. Signs/Symptoms:Murmur; Risk                  Factors:Hypertension. Anxiety.  Sonographer:     Cristela Blue Referring Phys:  [id number] EKTA V PATEL Diagnosing Phys: Mellody Drown Alluri IMPRESSIONS  1. Left ventricular ejection fraction, by estimation, is 65 to 70%. The left ventricle has normal function. The left ventricle has no regional wall motion abnormalities. There is mild left ventricular hypertrophy. Left ventricular diastolic parameters are consistent with Grade I diastolic dysfunction (impaired relaxation).  2.  Right ventricular systolic function is normal. The right ventricular size is normal.  3. The mitral valve is abnormal. Trivial mitral valve regurgitation.  4. 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 No regurgitation.  5. Agitated saline contrast bubble study was  PROGRESS NOTE    BRANDICE BUSSER  NWG:[id number] DOB: 05-Mar-1941 DOA: 09/23/2023 PCP: Dortha Kern, MD   Assessment & Plan:   Principal Problem:   Fall at home, initial encounter Active Problems:   Displaced intertrochanteric fracture of right femur, initial encounter for closed fracture [facility])   Chest pain   Essential hypertension   CKD stage 4 secondary to hypertension (HCC)   Anemia of chronic disease   Leukocytosis   On long term prednisone therapy   Aortic stenosis, mild  Assessment and Plan: Fall: at home. PT recs SNF     Displaced intertrochanteric fracture of right femur: s/p right hip intramedullary nail 09/24/23 as per ortho surg. Norco, morphine prn for pain. On long term steroids    Chest pain: etiology unclear. Hx of right leg DVT. V/Q scan ordered by admitting physician but pt refused citing significant pain from her femur fracture 09/24/23. Pt again refused V/Q scan so it was d/c 09/25/23. Saturating in low 90s on RA. Resolved  Right arm wound: present on admission. Continue w/ wound care    HTN: continue on amlodipine, coreg   CKDIV:  Cr is trending down from day prior. Avoid nephrotoxic meds   Mild aortic stenosis: continue w/ supportive care    Obesity: BMI 30.2. Would benefit from weight loss   DVT prophylaxis:  lovenox Code Status: full  Family Communication: called pt's son, Onalee Hua, and answered his questions  Disposition Plan: PT recs SNF   Level of care: Progressive Status is: Inpatient Remains inpatient appropriate because: waiting on SNF placement     Consultants:  Ortho surg Cardio   Procedures:   Antimicrobials:   Subjective: Pt c/o fatigue   Objective: Vitals:   09/26/23 2009 09/27/23 0053 09/27/23 0435 09/27/23 0818  BP: 114/63 129/71 134/69 (!) 145/67  Pulse: 86 73 76 86  Resp: 18 18 16 16   Temp: 98.8 F (37.1 C) 97.8 F (36.6 C) 98 F (36.7 C) 98.4 F (36.9 C)  TempSrc: Oral Oral  Oral  SpO2: 94% 95% 95% 95%   Weight:      Height:        Intake/Output Summary (Last 24 hours) at 09/27/2023 0835 Last data filed at 09/27/2023 0435 Gross per 24 hour  Intake --  Output 700 ml  Net -700 ml   Filed Weights   09/23/23 1637 09/24/23 1409  Weight: 68 kg 68 kg    Examination:  General exam: Appears comfortable   Respiratory system: decreased breath sounds b/l  Cardiovascular system: S1/S2+. No rubs or clicks  Gastrointestinal system: abd is soft, NT, obese & normal bowel sounds Central nervous system: Alert & oriented. Moves all extremities  Psychiatry: judgement and insight appears at baseline. Appropriate mood and affect    Data Reviewed: I have personally reviewed following labs and imaging studies  CBC: Recent Labs  Lab 09/23/23 1643 09/24/23 0327 09/25/23 0450 09/26/23 0429 09/27/23 0445  WBC 20.2* 22.7* 24.0* 24.5* 20.1*  HGB 14.8 13.4 13.4 11.3* 11.1*  HCT 44.9 39.8 38.9 33.6* 33.0*  MCV 88.6 84.9 84.9 87.3 87.3  PLT 214 188 170 152 166   Basic Metabolic Panel: Recent Labs  Lab 09/23/23 1643 09/24/23 0327 09/25/23 0450 09/26/23 0429 09/27/23 0445  NA 137 137 135 135 132*  K 3.5 3.3* 3.8 3.7 4.0  CL 99 101 101 100 99  CO2 23 25 25 25 25   GLUCOSE 210* 166* 194* 166* 119*  BUN 41* 40* 36* 52* 49*

## 2023-09-28 DIAGNOSIS — S7291XA Unspecified fracture of right femur, initial encounter for closed fracture: Secondary | ICD-10-CM | POA: Diagnosis not present

## 2023-09-28 LAB — BASIC METABOLIC PANEL
Anion gap: 7 (ref 5–15)
BUN: 46 mg/dL — ABNORMAL HIGH (ref 8–23)
CO2: 26 mmol/L (ref 22–32)
Calcium: 8.1 mg/dL — ABNORMAL LOW (ref 8.9–10.3)
Chloride: 100 mmol/L (ref 98–111)
Creatinine, Ser: 1.17 mg/dL — ABNORMAL HIGH (ref 0.44–1.00)
GFR, Estimated: 47 mL/min — ABNORMAL LOW (ref >60)
Glucose, Bld: 138 mg/dL — ABNORMAL HIGH (ref 70–99)
Potassium: 4.1 mmol/L (ref 3.5–5.1)
Sodium: 133 mmol/L — ABNORMAL LOW (ref 135–145)

## 2023-09-28 LAB — CBC
HCT: 34 % — ABNORMAL LOW (ref 36.0–46.0)
Hemoglobin: 11.3 g/dL — ABNORMAL LOW (ref 12.0–15.0)
MCH: 29.4 pg (ref 26.0–34.0)
MCHC: 33.2 g/dL (ref 30.0–36.0)
MCV: 88.3 fL (ref 80.0–100.0)
Platelets: 176 10*3/uL (ref 150–400)
RBC: 3.85 MIL/uL — ABNORMAL LOW (ref 3.87–5.11)
RDW: 13.1 % (ref 11.5–15.5)
WBC: 19.8 10*3/uL — ABNORMAL HIGH (ref 4.0–10.5)
nRBC: 0 % (ref 0.0–0.2)

## 2023-09-28 MED ORDER — ORAL CARE MOUTH RINSE: 15.0000 mL | OROMUCOSAL | Status: DC | PRN

## 2023-09-28 NOTE — TOC Progression Note (Addendum)
Transition of Care [facility]) - Progression Note    Patient Details  Name: Linda Bradley MRN: [id number] Date of Birth: 17-Feb-1941  [facility] CM/SW Contact  Bing Quarry, RN Phone Number: 09/28/2023, 4:23 PM  Clinical Narrative: 09/28/23: Sherron Monday with patient regarding decision between STR's and she chose [facility] bed offer. Noted as Accepted in HUB, notified AC via secure text message, and am attempting to start insurance authorization via [employer]--but it is not accepting her information at the moment. 425 pm.   10/6: Auth request ID via [employer] #[id number].    Gabriel Cirri MSN RN CM  Transitions of Care Department [facility] [phone] Weekends Only    Expected Discharge Plan: Skilled Nursing Facility Barriers to Discharge: Continued Medical Work up  Expected Discharge Plan and Services       Living arrangements for the past 2 months: Single Family Home                                       Social Determinants of Health (SDOH) Interventions SDOH Screenings   Food Insecurity: No Food Insecurity (09/24/2023)  Housing: Low Risk  (09/24/2023)  Transportation Needs: No Transportation Needs (09/24/2023)  Utilities: Not At Risk (09/24/2023)  Financial Resource Strain: Medium Risk (06/09/2023)   Received from [facility]  Tobacco Use: Low Risk  (09/24/2023)    Readmission Risk Interventions     No data to display

## 2023-09-28 NOTE — Plan of Care (Signed)

## 2023-09-28 NOTE — Progress Notes (Signed)
Physical Therapy Treatment Patient Details Name: Linda Bradley MRN: [id number] DOB: 1941-02-26 Today's Date: 09/28/2023   History of Present Illness Patient is a 82 y.o. 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 Current MD assessment: s/p R hip IM nail secondary to fracture from recent fall.    PT Comments  Pt initially resistant to therapy stating she was too tired and painful but she was easily redirected and overall did well today within limits.  Supine A/AAROM x 1 then transitions to sitting with bed features and mod a x 1. Steady in sitting for LE AROM.  She is then able to stand with min/mod a x 1 and takes a few very small sidesteps along bed with RW for support and mod a x 1.  Returns to supine with mod a x 1 and repositioned for comfort.   If plan is discharge home, recommend the following: A lot of help with bathing/dressing/bathroom;Help with stairs or ramp for entrance;Assist for transportation;Assistance with cooking/housework;A lot of help with walking and/or transfers   Can travel by private vehicle        Equipment Recommendations       Recommendations for Other Services       Precautions / Restrictions Precautions Precautions: Fall Restrictions Weight Bearing Restrictions: No RLE Weight Bearing: Weight bearing as tolerated     Mobility  Bed Mobility Overal bed mobility: Needs Assistance Bed Mobility: Supine to Sit, Sit to Supine     Supine to sit: Mod assist Sit to supine: Max assist   General bed mobility comments: increased time and cues but does assist as able. Patient Response: Cooperative  Transfers Overall transfer level: Needs assistance Equipment used: Rolling walker (2 wheels) Transfers:  Sit to/from Stand Sit to Stand: Mod assist           General transfer comment: verbal cues for foot and hand placement, and general sequencing.    Ambulation/Gait Ambulation/Gait assistance: Mod assist Gait Distance (Feet): 2 Feet Assistive device: Rolling walker (2 wheels) Gait Pattern/deviations: Step-to pattern, Shuffle Gait velocity: decreased     General Gait Details: sidesteps along bed.   Stairs             Wheelchair Mobility     Tilt Bed Tilt Bed Patient Response: Cooperative  Modified Rankin (Stroke Patients Only)       Balance Overall balance assessment: Needs assistance Sitting-balance support: Feet supported, No upper extremity supported Sitting balance-Leahy Scale: Good     Standing balance support: Bilateral upper extremity supported, During functional activity, Reliant on assistive device for balance Standing balance-Leahy Scale: Poor Standing balance comment: Unable to stand or balance without assist or RW                            Cognition Arousal: Alert Behavior During Therapy: WFL for tasks assessed/performed Overall Cognitive Status: Within Functional Limits for tasks assessed                                          Exercises Other Exercises Other Exercises: supine AAROM and seated AROM    General Comments        Pertinent Vitals/Pain Pain Assessment  Pain Assessment: Faces Faces Pain Scale: Hurts even more Pain Location: R hip Pain Descriptors / Indicators: Grimacing, Guarding, Sore Pain Intervention(s): Limited activity within patient's tolerance, Monitored during session, Repositioned, Premedicated before session    Home Living                          Prior Function            PT Goals (current goals can now be found in the care plan section) Progress towards PT goals: Progressing toward goals    Frequency    BID      PT Plan      Co-evaluation               AM-PAC PT "6 Clicks" Mobility   Outcome Measure  Help needed turning from your back to your side while in a flat bed without using bedrails?: A Lot Help needed moving from lying on your back to sitting on the side of a flat bed without using bedrails?: A Lot Help needed moving to and from a bed to a chair (including a wheelchair)?: A Lot Help needed standing up from a chair using your arms (e.g., wheelchair or bedside chair)?: A Lot Help needed to walk in hospital room?: Total Help needed climbing 3-5 steps with a railing? : Total 6 Click Score: 10    End of Session Equipment Utilized During Treatment: Gait belt Activity Tolerance: Patient limited by pain;Patient limited by fatigue Patient left: with call bell/phone within reach;with nursing/sitter in room;in bed;with bed alarm set Nurse Communication: Mobility status PT Visit Diagnosis: Unsteadiness on feet (R26.81);Other abnormalities of gait and mobility (R26.89);Muscle weakness (generalized) (M62.81);Difficulty in walking, not elsewhere classified (R26.2);Pain Pain - Right/Left: Right Pain - part of body: Hip     Time: 1324-4010 PT Time Calculation (min) (ACUTE ONLY): 18 min  Charges:    $Therapeutic Exercise: 8-22 mins PT General Charges $$ ACUTE PT VISIT: 1 Visit                   Danielle Dess, PTA 09/28/23, 1:20 PM

## 2023-09-28 NOTE — Progress Notes (Signed)
PROGRESS NOTE    Linda Bradley  WUJ:[id number] DOB: May 11, 1941 DOA: 09/23/2023 PCP: Dortha Kern, MD   Assessment & Plan:   Principal Problem:   Fall at home, initial encounter Active Problems:   Displaced intertrochanteric fracture of right femur, initial encounter for closed fracture [facility])   Chest pain   Essential hypertension   CKD stage 4 secondary to hypertension (HCC)   Anemia of chronic disease   Leukocytosis   On long term prednisone therapy   Aortic stenosis, mild  Assessment and Plan: Fall: at home. PT recs SNF     Displaced intertrochanteric fracture of right femur: s/p right hip intramedullary nail 09/24/23 as per ortho surg. Norco, morphine prn for pain. On long term steroids    Chest pain: etiology unclear. Hx of right leg DVT. V/Q scan ordered by admitting physician but pt refused citing significant pain from her femur fracture 09/24/23. Pt again refused V/Q scan so it was d/c 09/25/23. Saturating in low 90s on RA. Resolved  Right arm wound: present on admission. Continue w/ wound care    HTN: continue on coreg, amlodipine   CKDIV: Cr is trending down again today. Avoid nephrotoxic meds  Mild aortic stenosis: continue w/ supportive care     Obesity: BMI 30.2. Would benefit from weight loss   DVT prophylaxis:  lovenox Code Status: full  Family Communication: called pt's son, Onalee Hua, and answered his questions  Disposition Plan: PT recs SNF   Level of care: Progressive Status is: Inpatient Remains inpatient appropriate because: waiting on SNF placement still     Consultants:  Ortho surg Cardio   Procedures:   Antimicrobials:   Subjective: Pt c/o malaise   Objective: Vitals:   09/27/23 1123 09/27/23 1701 09/28/23 0430 09/28/23 0740  BP: 114/65 (!) 126/49 (!) 155/66 (!) 140/66  Pulse: 83 84 75 85  Resp: 16 16  16   Temp: 98.4 F (36.9 C) 97.9 F (36.6 C) 97.9 F (36.6 C) 98.1 F (36.7 C)  TempSrc: Oral Oral Oral Oral  SpO2: 94% 94%  96% 96%  Weight:      Height:        Intake/Output Summary (Last 24 hours) at 09/28/2023 0842 Last data filed at 09/28/2023 0430 Gross per 24 hour  Intake 480 ml  Output 600 ml  Net -120 ml   Filed Weights   09/23/23 1637 09/24/23 1409  Weight: 68 kg 68 kg    Examination:  General exam: Appears calm & comfortable  Respiratory system: diminished breath sounds b/l  Cardiovascular system: S1/S2+. No rubs or clicks  Gastrointestinal system: abd is soft, NT, obese & normal bowel sounds  Central nervous system: Alert & oriented. Moves all extremities  Psychiatry: judgement and insight appears at baseline. Flat mood and affect     Data Reviewed: I have personally reviewed following labs and imaging studies  CBC: Recent Labs  Lab 09/24/23 0327 09/25/23 0450 09/26/23 0429 09/27/23 0445 09/28/23 0425  WBC 22.7* 24.0* 24.5* 20.1* 19.8*  HGB 13.4 13.4 11.3* 11.1* 11.3*  HCT 39.8 38.9 33.6* 33.0* 34.0*  MCV 84.9 84.9 87.3 87.3 88.3  PLT 188 170 152 166 176   Basic Metabolic Panel: Recent Labs  Lab 09/24/23 0327 09/25/23 0450 09/26/23 0429 09/27/23 0445 09/28/23 0425  NA 137 135 135 132* 133*  K 3.3* 3.8 3.7 4.0 4.1  CL 101 101 100 99 100  CO2 25 25 25 25 26   GLUCOSE 166* 194* 166* 119* 138*  BUN 40*  36* 52* 49* 46*  CREATININE 1.22* 1.31* 1.86* 1.41* 1.17*  CALCIUM 8.7* 8.5* 8.4* 8.1* 8.1*   GFR: Estimated Creatinine Clearance: 32.1 mL/min (A) (by C-G formula based on SCr of 1.17 mg/dL (H)). Liver Function Tests: Recent Labs  Lab 09/24/23 0327  AST 17  ALT 31  ALKPHOS 91  BILITOT 1.1  PROT 5.9*  ALBUMIN 3.2*   No results for input(s): "LIPASE", "AMYLASE" in the last 168 hours. No results for input(s): "AMMONIA" in the last 168 hours. Coagulation Profile: No results for input(s): "INR", "PROTIME" in the last 168 hours. Cardiac Enzymes: Recent Labs  Lab 09/23/23 1643  CKTOTAL <5*   BNP (last 3 results) No results for input(s): "PROBNP" in the last  8760 hours. HbA1C: No results for input(s): "HGBA1C" in the last 72 hours. CBG: No results for input(s): "GLUCAP" in the last 168 hours. Lipid Profile: No results for input(s): "CHOL", "HDL", "LDLCALC", "TRIG", "CHOLHDL", "LDLDIRECT" in the last 72 hours. Thyroid Function Tests: No results for input(s): "TSH", "T4TOTAL", "FREET4", "T3FREE", "THYROIDAB" in the last 72 hours. Anemia Panel: No results for input(s): "VITAMINB12", "FOLATE", "FERRITIN", "TIBC", "IRON", "RETICCTPCT" in the last 72 hours. Sepsis Labs: No results for input(s): "PROCALCITON", "LATICACIDVEN" in the last 168 hours.  No results found for this or any previous visit (from the past 240 hour(s)).       Radiology Studies: No results found.      Scheduled Meds:  amLODipine  5 mg Oral Daily   aspirin  81 mg Oral Daily   carvedilol  25 mg Oral BID WC   clonazePAM  0.5 mg Oral QHS   docusate sodium  100 mg Oral BID   enoxaparin (LOVENOX) injection  30 mg Subcutaneous Daily   feeding supplement  237 mL Oral BID BM   multivitamin with minerals  1 tablet Oral Daily   predniSONE  10 mg Oral Daily   Continuous Infusions:     LOS: 5 days      Charise Killian, MD [facility] Pager [phone]  If 7PM-7AM, please contact night-coverage [email or web address] 09/28/2023, 8:42 AM

## 2023-09-28 NOTE — Plan of Care (Signed)

## 2023-09-29 DIAGNOSIS — S72141A Displaced intertrochanteric fracture of right femur, initial encounter for closed fracture: Secondary | ICD-10-CM | POA: Diagnosis not present

## 2023-09-29 LAB — BASIC METABOLIC PANEL
Anion gap: 8 (ref 5–15)
BUN: 45 mg/dL — ABNORMAL HIGH (ref 8–23)
CO2: 26 mmol/L (ref 22–32)
Calcium: 8.3 mg/dL — ABNORMAL LOW (ref 8.9–10.3)
Chloride: 101 mmol/L (ref 98–111)
Creatinine, Ser: 1.04 mg/dL — ABNORMAL HIGH (ref 0.44–1.00)
GFR, Estimated: 54 mL/min — ABNORMAL LOW (ref >60)
Glucose, Bld: 117 mg/dL — ABNORMAL HIGH (ref 70–99)
Potassium: 4.2 mmol/L (ref 3.5–5.1)
Sodium: 135 mmol/L (ref 135–145)

## 2023-09-29 LAB — CBC
HCT: 31.6 % — ABNORMAL LOW (ref 36.0–46.0)
Hemoglobin: 10.7 g/dL — ABNORMAL LOW (ref 12.0–15.0)
MCH: 29.5 pg (ref 26.0–34.0)
MCHC: 33.9 g/dL (ref 30.0–36.0)
MCV: 87.1 fL (ref 80.0–100.0)
Platelets: 175 10*3/uL (ref 150–400)
RBC: 3.63 MIL/uL — ABNORMAL LOW (ref 3.87–5.11)
RDW: 13.2 % (ref 11.5–15.5)
WBC: 20.2 10*3/uL — ABNORMAL HIGH (ref 4.0–10.5)
nRBC: 0 % (ref 0.0–0.2)

## 2023-09-29 MED ORDER — CLONAZEPAM 0.5 MG PO TABS: 0.5000 mg | ORAL_TABLET | Freq: Two times a day (BID) | ORAL | 0 refills | Status: AC | PRN

## 2023-09-29 NOTE — TOC Progression Note (Signed)
Transition of Care [facility]) - Progression Note    Patient Details  Name: Linda Bradley MRN: [id number] Date of Birth: [birth year]  Transition of Care [facility]) CM/SW Contact  Truddie Hidden, RN Phone Number: 09/29/2023, 9:24 AM  Clinical Narrative:    Drucie Opitz approved # [id number] 10/7-10/10. MD notified.    Expected Discharge Plan: Skilled Nursing Facility Barriers to Discharge: Continued Medical Work up  Expected Discharge Plan and Services       Living arrangements for the past 2 months: Single Family Home                                       Social Determinants of Health (SDOH) Interventions SDOH Screenings   Food Insecurity: No Food Insecurity (09/24/2023)  Housing: Low Risk  (09/24/2023)  Transportation Needs: No Transportation Needs (09/24/2023)  Utilities: Not At Risk (09/24/2023)  Financial Resource Strain: Medium Risk (06/09/2023)   Received from [facility]  Tobacco Use: Low Risk  (09/24/2023)    Readmission Risk Interventions     No data to display

## 2023-09-29 NOTE — Progress Notes (Signed)
   Subjective: 5 Days Post-Op Procedure(s) (LRB): INTRAMEDULLARY (IM) NAIL INTERTROCHANTERIC (Right) Patient reports pain as mild.   Patient is well, and has had no acute complaints or problems Denies any CP, SOB, ABD pain. We will continue therapy today.  Plan is to go Skilled nursing facility today  Objective: Vital signs in last 24 hours: Temp:  [98.1 F (36.7 C)-98.5 F (36.9 C)] 98.3 F (36.8 C) (10/07 0840) Pulse Rate:  [78-92] 88 (10/07 0840) Resp:  [16-18] 16 (10/07 0840) BP: (117-151)/(59-74) 137/65 (10/07 0840) SpO2:  [94 %-96 %] 95 % (10/07 0840)  Intake/Output from previous day: 10/06 0701 - 10/07 0700 In: 720 [P.O.:720] Out: 600 [Urine:600] Intake/Output this shift: No intake/output data recorded.  Recent Labs    09/27/23 0445 09/28/23 0425 09/29/23 0451  HGB 11.1* 11.3* 10.7*   Recent Labs    09/28/23 0425 09/29/23 0451  WBC 19.8* 20.2*  RBC 3.85* 3.63*  HCT 34.0* 31.6*  PLT 176 175   Recent Labs    09/28/23 0425 09/29/23 0451  NA 133* 135  K 4.1 4.2  CL 100 101  CO2 26 26  BUN 46* 45*  CREATININE 1.17* 1.04*  GLUCOSE 138* 117*  CALCIUM 8.1* 8.3*   No results for input(s): "LABPT", "INR" in the last 72 hours.  EXAM General - Patient is Alert, Appropriate, and Oriented Extremity - Neurovascular intact Sensation intact distally Intact pulses distally Dorsiflexion/Plantar flexion intact Dressing - dressing C/D/I and no drainage Motor Function - intact, moving foot and toes well on exam.   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   Heart murmur    Hypercholesterolemia    Hypertension    Lymphadenitis    Renal disorder    Skin cancer    Spontaneous ecchymoses    Vitamin D deficiency     Assessment/Plan:   5 Days Post-Op Procedure(s) (LRB): INTRAMEDULLARY (IM) NAIL INTERTROCHANTERIC (Right) Principal Problem:   Fall at home, initial  encounter Active Problems:   Essential hypertension   Aortic stenosis, mild   Anemia of chronic disease   CKD stage 4 secondary to hypertension (HCC)   On long term prednisone therapy   Leukocytosis   Displaced intertrochanteric fracture of right femur, initial encounter for closed fracture (HCC)   Chest pain  Estimated body mass index is 30.28 kg/m as calculated from the following:   Height as of this encounter: 4\' 11"  (1.499 m).   Weight as of this encounter: 68 kg. Advance diet Up with therapy WBAT RLE CM to assist with discharge to SNF today Follow up with [facility] in 2 weeks Lovenox daily x 14 days  DVT Prophylaxis - Lovenox, TED hose, and SCDs Weight-Bearing as tolerated to right leg   T. Cranston Neighbor, PA-C [facility] 09/29/2023, 2:16 PM

## 2023-09-29 NOTE — Care Management Important Message (Signed)
Important Message  Patient Details  Name: Linda Bradley MRN: [id number] Date of Birth: Apr 08, 1941   Important Message Given:  Yes - Medicare IM     Nataley Bahri, Stephan Minister 09/29/2023, 3:40 PM

## 2023-09-29 NOTE — TOC Transition Note (Signed)
Transition of Care [facility]) - CM/SW Discharge Note   Patient Details  Name: TIAUNA WHISNANT MRN: [id number] Date of Birth: 1941-06-22  Transition of Care [facility]) CM/SW Contact:  Truddie Hidden, RN Phone Number: 09/29/2023, 11:10 AM   Clinical Narrative:   Spoke with Tiffany  in admissions at  [facility]  Per facility patient admission confirmed for today. Patient assigned room # 606 Nurse will call report to  [phone] Face sheet and medical necessity forms printed to the floor to be added to the EMS pack EMS arranged  Discharge summary and SNF transfer report sent in HUB.  Nurse, and family notified spoke with her son, Hassell Done signing off.      Barriers to Discharge: Continued Medical Work up   Patient Goals and CMS Choice CMS Medicare.gov Compare Post Acute Care list provided to:: Patient Choice offered to / list presented to : Patient  Discharge Placement                         Discharge Plan and Services Additional resources added to the After Visit Summary for                                       Social Determinants of Health (SDOH) Interventions SDOH Screenings   Food Insecurity: No Food Insecurity (09/24/2023)  Housing: Low Risk  (09/24/2023)  Transportation Needs: No Transportation Needs (09/24/2023)  Utilities: Not At Risk (09/24/2023)  Financial Resource Strain: Medium Risk (06/09/2023)   Received from [facility]  Tobacco Use: Low Risk  (09/24/2023)     Readmission Risk Interventions     No data to display

## 2023-09-29 NOTE — Discharge Summary (Signed)
D, MD. Go in 1 week(s).   Specialties: Cardiology, Internal Medicine Contact information: [address] [place] [address] [phone]         Evon Slack, PA-C Follow up in 14 day(s).   Specialties: Orthopedic Surgery, Emergency Medicine Why: Skin check. Contact information: [address] [place] [address] [phone]              Contact information for after-discharge care     Destination     [facility] Preferred SNF .   Service: Skilled Nursing Contact information: [address] [place] [address] [phone]                    Allergies  Allergen Reactions   Influenza Vaccines Anaphylaxis   Xarelto [Rivaroxaban] Other (See Comments)    Severe rectal bleeding per patient    Statins Other (See Comments)    Other Reaction: CNS DISORDER   Chlorthalidone Diarrhea    Consultations: Ortho surg   Procedures/Studies: ECHOCARDIOGRAM COMPLETE BUBBLE STUDY  Result Date: 09/25/2023    ECHOCARDIOGRAM REPORT   Patient Name:   Linda Bradley Date of Exam: 09/25/2023 Medical Rec #:  [id number]       Height:       59.0 in Accession #:    [id number]      Weight:       149.9 lb Date of Birth:  15-Apr-1941      BSA:          1.632 m Patient Age:    82 years        BP:           155/73 mmHg Patient Gender: F               HR:            79 bpm. Exam Location:  [facility] Procedure: 2D Echo, Cardiac Doppler, Color Doppler and Saline Contrast Bubble            Study Indications:     Aortic stenosis                  Chest pain  History:         Patient has prior history of Echocardiogram examinations, most                  recent 03/17/2020. Signs/Symptoms:Murmur; Risk                  Factors:Hypertension. Anxiety.  Sonographer:     Cristela Blue Referring Phys:  [id number] EKTA V PATEL Diagnosing Phys: Mellody Drown Alluri IMPRESSIONS  1. Left ventricular ejection fraction, by estimation, is 65 to 70%. The left ventricle has normal function. The left ventricle has no regional wall motion abnormalities. There is mild left ventricular hypertrophy. Left ventricular diastolic parameters are consistent with Grade I diastolic dysfunction (impaired relaxation).  2. Right ventricular systolic function is normal. The right ventricular size is normal.  3. The mitral valve is abnormal. Trivial mitral valve regurgitation.  4. 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 No regurgitation.  5. Agitated saline contrast bubble study was negative, with no evidence of any interatrial shunt. FINDINGS  Left Ventricle: Left ventricular ejection fraction, by estimation, is 65 to 70%. The left ventricle has normal function. The left ventricle has no regional wall motion abnormalities. The left ventricular internal cavity size was normal in  Physician Discharge Summary  Linda Bradley WUJ:[id number] DOB: Aug 26, 1941 DOA: 09/23/2023  PCP: Dortha Kern, MD  Admit date: 09/23/2023 Discharge date: 09/29/2023  Admitted From: home  Disposition:  SNF  Recommendations for Outpatient Follow-up:  Follow up with PCP in 1-2 weeks   Home Health: no  Equipment/Devices:  Discharge Condition: stable CODE STATUS: full  Diet recommendation: Heart Healthy  Brief/Interim Summary: HPI was taken from Dr. Irena Cords:  Linda Bradley is a 82 y.o. female with medical history significant for hypertension, rheumatoid arthritis, chronic kidney disease,hyperlipidemia, dermatitis, anxiety disorder, lymphadenitis, vitamin D deficiency, anemia of chronic disease, aortic stenosis, anxiety presenting with fall and right hip fracture.  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 Patient was seen by orthopedic in the emergency room with plans for operative repair in a.m. and was told patient would be scheduled for surgery around 1230.  Patient had complaints of chest pain today and has been having chest pain in the past few weeks.  No reports of palpitation or shortness of breath. Initial vitals showed blood pressure of 148/74 O2 sats of 96%, heart rate of 87 respirations of 20. Blood work showed CKD stage IIIb with a creatinine of 1.59 EGFR of 33, glucose 210 and BUN of 41 otherwise normal electrolytes, LFTs added on and are pending. CBC shows leukocytosis of 20.2 normal hemoglobin and normal platelet count. Patient had an elevated D-dimer, normal troponin, CPK less than 5. Chest x-ray today showed low lung volumes with bronchovascular crowding versus vascular congestion.  On auscultation patient does not have crackles or rales. Femur x-ray showed comminuted intertrochanteric fracture on the right.  Discharge Diagnoses:  Principal Problem:   Fall at home,  initial encounter Active Problems:   Displaced intertrochanteric fracture of right femur, initial encounter for closed fracture (HCC)   Chest pain   Essential hypertension   CKD stage 4 secondary to hypertension (HCC)   Anemia of chronic disease   Leukocytosis   On long term prednisone therapy   Aortic stenosis, mild  Fall: at home. PT recs SNF     Displaced intertrochanteric fracture of right femur: s/p right hip intramedullary nail 09/24/23 as per ortho surg. Norco prn for pain. On long term steroids    Chest pain: etiology unclear. Hx of right leg DVT. V/Q scan ordered by admitting physician but pt refused citing significant pain from her femur fracture 09/24/23. Pt again refused V/Q scan so it was d/c 09/25/23. Saturating in low 90s on RA. Resolved  Right arm wound: present on admission. Continue w/ wound care    HTN: continue on coreg, amlodipine   CKDIV: Cr is trending down daily. Avoid nephrotoxic meds  Mild aortic stenosis: continue w/ supportive care     Obesity: BMI 30.2. Would benefit from weight loss   Discharge Instructions  Discharge Instructions     Diet - low sodium heart healthy   Complete by: As directed    Discharge instructions   Complete by: As directed    F/u w/ PCP in 1-2 weeks   Discharge instructions   Complete by: As directed    F/u w/ PCP in 1-2 weeks   Discharge wound care:   Complete by: As directed    Wound care  Daily      Comments: Soak dressing to right elbow with NS to assist with removal, then cover  Physician Discharge Summary  Linda Bradley WUJ:[id number] DOB: Aug 26, 1941 DOA: 09/23/2023  PCP: Dortha Kern, MD  Admit date: 09/23/2023 Discharge date: 09/29/2023  Admitted From: home  Disposition:  SNF  Recommendations for Outpatient Follow-up:  Follow up with PCP in 1-2 weeks   Home Health: no  Equipment/Devices:  Discharge Condition: stable CODE STATUS: full  Diet recommendation: Heart Healthy  Brief/Interim Summary: HPI was taken from Dr. Irena Cords:  Linda Bradley is a 82 y.o. female with medical history significant for hypertension, rheumatoid arthritis, chronic kidney disease,hyperlipidemia, dermatitis, anxiety disorder, lymphadenitis, vitamin D deficiency, anemia of chronic disease, aortic stenosis, anxiety presenting with fall and right hip fracture.  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 Patient was seen by orthopedic in the emergency room with plans for operative repair in a.m. and was told patient would be scheduled for surgery around 1230.  Patient had complaints of chest pain today and has been having chest pain in the past few weeks.  No reports of palpitation or shortness of breath. Initial vitals showed blood pressure of 148/74 O2 sats of 96%, heart rate of 87 respirations of 20. Blood work showed CKD stage IIIb with a creatinine of 1.59 EGFR of 33, glucose 210 and BUN of 41 otherwise normal electrolytes, LFTs added on and are pending. CBC shows leukocytosis of 20.2 normal hemoglobin and normal platelet count. Patient had an elevated D-dimer, normal troponin, CPK less than 5. Chest x-ray today showed low lung volumes with bronchovascular crowding versus vascular congestion.  On auscultation patient does not have crackles or rales. Femur x-ray showed comminuted intertrochanteric fracture on the right.  Discharge Diagnoses:  Principal Problem:   Fall at home,  initial encounter Active Problems:   Displaced intertrochanteric fracture of right femur, initial encounter for closed fracture (HCC)   Chest pain   Essential hypertension   CKD stage 4 secondary to hypertension (HCC)   Anemia of chronic disease   Leukocytosis   On long term prednisone therapy   Aortic stenosis, mild  Fall: at home. PT recs SNF     Displaced intertrochanteric fracture of right femur: s/p right hip intramedullary nail 09/24/23 as per ortho surg. Norco prn for pain. On long term steroids    Chest pain: etiology unclear. Hx of right leg DVT. V/Q scan ordered by admitting physician but pt refused citing significant pain from her femur fracture 09/24/23. Pt again refused V/Q scan so it was d/c 09/25/23. Saturating in low 90s on RA. Resolved  Right arm wound: present on admission. Continue w/ wound care    HTN: continue on coreg, amlodipine   CKDIV: Cr is trending down daily. Avoid nephrotoxic meds  Mild aortic stenosis: continue w/ supportive care     Obesity: BMI 30.2. Would benefit from weight loss   Discharge Instructions  Discharge Instructions     Diet - low sodium heart healthy   Complete by: As directed    Discharge instructions   Complete by: As directed    F/u w/ PCP in 1-2 weeks   Discharge instructions   Complete by: As directed    F/u w/ PCP in 1-2 weeks   Discharge wound care:   Complete by: As directed    Wound care  Daily      Comments: Soak dressing to right elbow with NS to assist with removal, then cover  Physician Discharge Summary  Linda Bradley WUJ:[id number] DOB: Aug 26, 1941 DOA: 09/23/2023  PCP: Dortha Kern, MD  Admit date: 09/23/2023 Discharge date: 09/29/2023  Admitted From: home  Disposition:  SNF  Recommendations for Outpatient Follow-up:  Follow up with PCP in 1-2 weeks   Home Health: no  Equipment/Devices:  Discharge Condition: stable CODE STATUS: full  Diet recommendation: Heart Healthy  Brief/Interim Summary: HPI was taken from Dr. Irena Cords:  Linda Bradley is a 82 y.o. female with medical history significant for hypertension, rheumatoid arthritis, chronic kidney disease,hyperlipidemia, dermatitis, anxiety disorder, lymphadenitis, vitamin D deficiency, anemia of chronic disease, aortic stenosis, anxiety presenting with fall and right hip fracture.  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 Patient was seen by orthopedic in the emergency room with plans for operative repair in a.m. and was told patient would be scheduled for surgery around 1230.  Patient had complaints of chest pain today and has been having chest pain in the past few weeks.  No reports of palpitation or shortness of breath. Initial vitals showed blood pressure of 148/74 O2 sats of 96%, heart rate of 87 respirations of 20. Blood work showed CKD stage IIIb with a creatinine of 1.59 EGFR of 33, glucose 210 and BUN of 41 otherwise normal electrolytes, LFTs added on and are pending. CBC shows leukocytosis of 20.2 normal hemoglobin and normal platelet count. Patient had an elevated D-dimer, normal troponin, CPK less than 5. Chest x-ray today showed low lung volumes with bronchovascular crowding versus vascular congestion.  On auscultation patient does not have crackles or rales. Femur x-ray showed comminuted intertrochanteric fracture on the right.  Discharge Diagnoses:  Principal Problem:   Fall at home,  initial encounter Active Problems:   Displaced intertrochanteric fracture of right femur, initial encounter for closed fracture (HCC)   Chest pain   Essential hypertension   CKD stage 4 secondary to hypertension (HCC)   Anemia of chronic disease   Leukocytosis   On long term prednisone therapy   Aortic stenosis, mild  Fall: at home. PT recs SNF     Displaced intertrochanteric fracture of right femur: s/p right hip intramedullary nail 09/24/23 as per ortho surg. Norco prn for pain. On long term steroids    Chest pain: etiology unclear. Hx of right leg DVT. V/Q scan ordered by admitting physician but pt refused citing significant pain from her femur fracture 09/24/23. Pt again refused V/Q scan so it was d/c 09/25/23. Saturating in low 90s on RA. Resolved  Right arm wound: present on admission. Continue w/ wound care    HTN: continue on coreg, amlodipine   CKDIV: Cr is trending down daily. Avoid nephrotoxic meds  Mild aortic stenosis: continue w/ supportive care     Obesity: BMI 30.2. Would benefit from weight loss   Discharge Instructions  Discharge Instructions     Diet - low sodium heart healthy   Complete by: As directed    Discharge instructions   Complete by: As directed    F/u w/ PCP in 1-2 weeks   Discharge instructions   Complete by: As directed    F/u w/ PCP in 1-2 weeks   Discharge wound care:   Complete by: As directed    Wound care  Daily      Comments: Soak dressing to right elbow with NS to assist with removal, then cover  Physician Discharge Summary  Linda Bradley WUJ:[id number] DOB: Aug 26, 1941 DOA: 09/23/2023  PCP: Dortha Kern, MD  Admit date: 09/23/2023 Discharge date: 09/29/2023  Admitted From: home  Disposition:  SNF  Recommendations for Outpatient Follow-up:  Follow up with PCP in 1-2 weeks   Home Health: no  Equipment/Devices:  Discharge Condition: stable CODE STATUS: full  Diet recommendation: Heart Healthy  Brief/Interim Summary: HPI was taken from Dr. Irena Cords:  Linda Bradley is a 82 y.o. female with medical history significant for hypertension, rheumatoid arthritis, chronic kidney disease,hyperlipidemia, dermatitis, anxiety disorder, lymphadenitis, vitamin D deficiency, anemia of chronic disease, aortic stenosis, anxiety presenting with fall and right hip fracture.  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 Patient was seen by orthopedic in the emergency room with plans for operative repair in a.m. and was told patient would be scheduled for surgery around 1230.  Patient had complaints of chest pain today and has been having chest pain in the past few weeks.  No reports of palpitation or shortness of breath. Initial vitals showed blood pressure of 148/74 O2 sats of 96%, heart rate of 87 respirations of 20. Blood work showed CKD stage IIIb with a creatinine of 1.59 EGFR of 33, glucose 210 and BUN of 41 otherwise normal electrolytes, LFTs added on and are pending. CBC shows leukocytosis of 20.2 normal hemoglobin and normal platelet count. Patient had an elevated D-dimer, normal troponin, CPK less than 5. Chest x-ray today showed low lung volumes with bronchovascular crowding versus vascular congestion.  On auscultation patient does not have crackles or rales. Femur x-ray showed comminuted intertrochanteric fracture on the right.  Discharge Diagnoses:  Principal Problem:   Fall at home,  initial encounter Active Problems:   Displaced intertrochanteric fracture of right femur, initial encounter for closed fracture (HCC)   Chest pain   Essential hypertension   CKD stage 4 secondary to hypertension (HCC)   Anemia of chronic disease   Leukocytosis   On long term prednisone therapy   Aortic stenosis, mild  Fall: at home. PT recs SNF     Displaced intertrochanteric fracture of right femur: s/p right hip intramedullary nail 09/24/23 as per ortho surg. Norco prn for pain. On long term steroids    Chest pain: etiology unclear. Hx of right leg DVT. V/Q scan ordered by admitting physician but pt refused citing significant pain from her femur fracture 09/24/23. Pt again refused V/Q scan so it was d/c 09/25/23. Saturating in low 90s on RA. Resolved  Right arm wound: present on admission. Continue w/ wound care    HTN: continue on coreg, amlodipine   CKDIV: Cr is trending down daily. Avoid nephrotoxic meds  Mild aortic stenosis: continue w/ supportive care     Obesity: BMI 30.2. Would benefit from weight loss   Discharge Instructions  Discharge Instructions     Diet - low sodium heart healthy   Complete by: As directed    Discharge instructions   Complete by: As directed    F/u w/ PCP in 1-2 weeks   Discharge instructions   Complete by: As directed    F/u w/ PCP in 1-2 weeks   Discharge wound care:   Complete by: As directed    Wound care  Daily      Comments: Soak dressing to right elbow with NS to assist with removal, then cover  Physician Discharge Summary  Linda Bradley WUJ:[id number] DOB: Aug 26, 1941 DOA: 09/23/2023  PCP: Dortha Kern, MD  Admit date: 09/23/2023 Discharge date: 09/29/2023  Admitted From: home  Disposition:  SNF  Recommendations for Outpatient Follow-up:  Follow up with PCP in 1-2 weeks   Home Health: no  Equipment/Devices:  Discharge Condition: stable CODE STATUS: full  Diet recommendation: Heart Healthy  Brief/Interim Summary: HPI was taken from Dr. Irena Cords:  Linda Bradley is a 82 y.o. female with medical history significant for hypertension, rheumatoid arthritis, chronic kidney disease,hyperlipidemia, dermatitis, anxiety disorder, lymphadenitis, vitamin D deficiency, anemia of chronic disease, aortic stenosis, anxiety presenting with fall and right hip fracture.  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 Patient was seen by orthopedic in the emergency room with plans for operative repair in a.m. and was told patient would be scheduled for surgery around 1230.  Patient had complaints of chest pain today and has been having chest pain in the past few weeks.  No reports of palpitation or shortness of breath. Initial vitals showed blood pressure of 148/74 O2 sats of 96%, heart rate of 87 respirations of 20. Blood work showed CKD stage IIIb with a creatinine of 1.59 EGFR of 33, glucose 210 and BUN of 41 otherwise normal electrolytes, LFTs added on and are pending. CBC shows leukocytosis of 20.2 normal hemoglobin and normal platelet count. Patient had an elevated D-dimer, normal troponin, CPK less than 5. Chest x-ray today showed low lung volumes with bronchovascular crowding versus vascular congestion.  On auscultation patient does not have crackles or rales. Femur x-ray showed comminuted intertrochanteric fracture on the right.  Discharge Diagnoses:  Principal Problem:   Fall at home,  initial encounter Active Problems:   Displaced intertrochanteric fracture of right femur, initial encounter for closed fracture (HCC)   Chest pain   Essential hypertension   CKD stage 4 secondary to hypertension (HCC)   Anemia of chronic disease   Leukocytosis   On long term prednisone therapy   Aortic stenosis, mild  Fall: at home. PT recs SNF     Displaced intertrochanteric fracture of right femur: s/p right hip intramedullary nail 09/24/23 as per ortho surg. Norco prn for pain. On long term steroids    Chest pain: etiology unclear. Hx of right leg DVT. V/Q scan ordered by admitting physician but pt refused citing significant pain from her femur fracture 09/24/23. Pt again refused V/Q scan so it was d/c 09/25/23. Saturating in low 90s on RA. Resolved  Right arm wound: present on admission. Continue w/ wound care    HTN: continue on coreg, amlodipine   CKDIV: Cr is trending down daily. Avoid nephrotoxic meds  Mild aortic stenosis: continue w/ supportive care     Obesity: BMI 30.2. Would benefit from weight loss   Discharge Instructions  Discharge Instructions     Diet - low sodium heart healthy   Complete by: As directed    Discharge instructions   Complete by: As directed    F/u w/ PCP in 1-2 weeks   Discharge instructions   Complete by: As directed    F/u w/ PCP in 1-2 weeks   Discharge wound care:   Complete by: As directed    Wound care  Daily      Comments: Soak dressing to right elbow with NS to assist with removal, then cover  D, MD. Go in 1 week(s).   Specialties: Cardiology, Internal Medicine Contact information: [address] [place] [address] [phone]         Evon Slack, PA-C Follow up in 14 day(s).   Specialties: Orthopedic Surgery, Emergency Medicine Why: Skin check. Contact information: [address] [place] [address] [phone]              Contact information for after-discharge care     Destination     [facility]erred [facility] .   Service: Skilled Nursing Contact information: [address] [place] [address] [phone]                    Allergies  Allergen Reactions   Influenza Vaccines Anaphylaxis   Xarelto [Rivaroxaban] Other (See Comments)    Severe rectal bleeding per patient    Statins Other (See Comments)    Other Reaction: CNS DISORDER   Chlorthalidone Diarrhea    Consultations: Ortho surg   Procedures/Studies: ECHOCARDIOGRAM COMPLETE BUBBLE STUDY  Result Date: 09/25/2023    ECHOCARDIOGRAM REPORT   Patient Name:   Linda Bradley Date of Exam: 09/25/2023 Medical Rec #:  [id number]       Height:       59.0 in Accession #:    [id number]      Weight:       149.9 lb Date of Birth:  15-Apr-1941      BSA:          1.632 m Patient Age:    82 years        BP:           155/73 mmHg Patient Gender: F               HR:            79 bpm. Exam Location:  [facility] Procedure: 2D Echo, Cardiac Doppler, Color Doppler and Saline Contrast Bubble            Study Indications:     Aortic stenosis                  Chest pain  History:         Patient has prior history of Echocardiogram examinations, most                  recent 03/17/2020. Signs/Symptoms:Murmur; Risk                  Factors:Hypertension. Anxiety.  Sonographer:     Cristela Blue Referring Phys:  [id number] EKTA V PATEL Diagnosing Phys: Mellody Drown Alluri IMPRESSIONS  1. Left ventricular ejection fraction, by estimation, is 65 to 70%. The left ventricle has normal function. The left ventricle has no regional wall motion abnormalities. There is mild left ventricular hypertrophy. Left ventricular diastolic parameters are consistent with Grade I diastolic dysfunction (impaired relaxation).  2. Right ventricular systolic function is normal. The right ventricular size is normal.  3. The mitral valve is abnormal. Trivial mitral valve regurgitation.  4. 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 No regurgitation.  5. Agitated saline contrast bubble study was negative, with no evidence of any interatrial shunt. FINDINGS  Left Ventricle: Left ventricular ejection fraction, by estimation, is 65 to 70%. The left ventricle has normal function. The left ventricle has no regional wall motion abnormalities. The left ventricular internal cavity size was normal in

## 2023-09-29 NOTE — Progress Notes (Signed)
Physical Therapy Treatment Patient Details Name: Linda Bradley MRN: [id number] DOB: 1941-03-28 Today's Date: 09/29/2023   History of Present Illness Patient is a 82 y.o. 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 Current MD assessment: s/p R hip IM nail secondary to fracture from recent fall.    PT Comments  Pt was pleasant and motivated to participate during the session and put forth good effort throughout. Pt needs Mod A bed mobility, and Min A for STS from EOB with RW. VC's for hand placement and sequencing. Once up, pt able to perform a mix of 1-2 short forwards/backwards steps with side shuffles as well, provided VC's for RW management and sequencing for steps. Overall needs Mod A once standing for support, notably when pt taking backwards steps and for RW management. Pt will benefit from continued PT services upon discharge to safely address deficits listed in patient problem list for decreased caregiver assistance and eventual return to PLOF.    If plan is discharge home, recommend the following: A lot of help with bathing/dressing/bathroom;Help with stairs or ramp for entrance;Assist for transportation;Assistance with cooking/housework;A lot of help with walking and/or transfers   Can travel by private vehicle     No  Equipment Recommendations  Other (comment) (TBD at next venue of care)    Recommendations for Other Services       Precautions / Restrictions Precautions Precautions: Fall Restrictions Weight Bearing Restrictions: Yes RLE Weight Bearing: Weight bearing as tolerated     Mobility  Bed Mobility Overal bed mobility: Needs Assistance Bed Mobility: Supine to Sit     Supine to sit: Mod assist     General  bed mobility comments: increased time, Mod A to help with LE's and trunk control    Transfers Overall transfer level: Needs assistance Equipment used: Rolling walker (2 wheels) Transfers: Sit to/from Stand Sit to Stand: Min assist           General transfer comment: increased cues for hand placement and sequencing    Ambulation/Gait Ambulation/Gait assistance: Mod assist Gait Distance (Feet): 2 Feet Assistive device: Rolling walker (2 wheels) Gait Pattern/deviations: Step-to pattern, Shuffle, Decreased step length - right, Decreased step length - left, Antalgic Gait velocity: decreased     General Gait Details: Able to take 1/2 steps forwards and backards, along with side shuffles to turn and sit into chair. Pt needing more assist with backwards steps, noting RLE slightly buckling but pt able to support self through BUE's on RW.   Stairs             Wheelchair Mobility     Tilt Bed    Modified Rankin (Stroke Patients Only)       Balance Overall balance assessment: Needs assistance Sitting-balance support: Feet supported, No upper extremity supported Sitting balance-Leahy Scale: Good     Standing balance support: Bilateral upper extremity supported, During functional activity, Reliant on assistive device for balance Standing balance-Leahy Scale: Poor Standing balance comment: Unable to stand or balance without assist or RW                            Cognition Arousal: Alert Behavior During Therapy: WFL for tasks assessed/performed Overall Cognitive Status: Within Functional Limits for tasks assessed  Exercises Total Joint Exercises Long Arc Quad: AROM, Strengthening, 10 reps, Right Marching in Standing: Seated, AROM, Both, 5 reps, Strengthening    General Comments        Pertinent Vitals/Pain Pain Assessment Pain Assessment: 0-10 Pain Score: 6  Pain Location: R hip Pain  Descriptors / Indicators: Grimacing, Guarding, Sore Pain Intervention(s): Monitored during session, Premedicated before session, Limited activity within patient's tolerance    Home Living                          Prior Function            PT Goals (current goals can now be found in the care plan section) Progress towards PT goals: Progressing toward goals    Frequency    BID      PT Plan      Co-evaluation              AM-PAC PT "6 Clicks" Mobility   Outcome Measure  Help needed turning from your back to your side while in a flat bed without using bedrails?: A Lot Help needed moving from lying on your back to sitting on the side of a flat bed without using bedrails?: A Lot Help needed moving to and from a bed to a chair (including a wheelchair)?: A Lot Help needed standing up from a chair using your arms (e.g., wheelchair or bedside chair)?: A Little Help needed to walk in hospital room?: Total Help needed climbing 3-5 steps with a railing? : Total 6 Click Score: 11    End of Session Equipment Utilized During Treatment: Gait belt Activity Tolerance: Patient limited by pain;Patient tolerated treatment well;Patient limited by fatigue Patient left: in chair;with chair alarm set;with call bell/phone within reach Nurse Communication: Mobility status PT Visit Diagnosis: Unsteadiness on feet (R26.81);Other abnormalities of gait and mobility (R26.89);Muscle weakness (generalized) (M62.81);Difficulty in walking, not elsewhere classified (R26.2);Pain Pain - Right/Left: Right Pain - part of body: Hip     Time: 4696-2952 PT Time Calculation (min) (ACUTE ONLY): 22 min  Charges:                            Cecile Sheerer, SPT 09/29/23, 2:03 PM

## 2024-05-19 ENCOUNTER — Other Ambulatory Visit: Payer: Self-pay | Admitting: Family Medicine

## 2024-05-19 ENCOUNTER — Ambulatory Visit
Admission: RE | Admit: 2024-05-19 | Discharge: 2024-05-19 | Disposition: A | Discharge status: Home / Self Care | Source: Ambulatory Visit | Attending: Family Medicine | Admitting: Family Medicine

## 2024-05-19 DIAGNOSIS — R600 Localized edema: Secondary | ICD-10-CM | POA: Diagnosis present

## 2024-05-19 DIAGNOSIS — R0989 Other specified symptoms and signs involving the circulatory and respiratory systems: Secondary | ICD-10-CM

## 2024-05-24 ENCOUNTER — Ambulatory Visit: Admitting: Urology

## 2024-08-02 ENCOUNTER — Ambulatory Visit: Admitting: Urology

## 2024-08-02 VITALS — BP 124/80 | HR 88 | Ht 59.0 in

## 2024-08-02 DIAGNOSIS — R32 Unspecified urinary incontinence: Secondary | ICD-10-CM

## 2024-08-02 DIAGNOSIS — N3941 Urge incontinence: Secondary | ICD-10-CM | POA: Diagnosis not present

## 2024-08-02 MED ORDER — GEMTESA 75 MG PO TABS: 75.0000 mg | ORAL_TABLET | Freq: Every day | ORAL | 0 refills | Status: DC

## 2024-08-02 MED ORDER — GEMTESA 75 MG PO TABS: 75.0000 mg | ORAL_TABLET | Freq: Every day | ORAL | 11 refills | Status: DC

## 2024-08-02 NOTE — Patient Instructions (Signed)

## 2024-08-02 NOTE — Progress Notes (Signed)
 08/02/2024 2:09 PM   Linda Bradley [birth year] [id number]  Referring provider: Lateef, Munsoor, MD [address] [place],  [place] [address]  Chief Complaint  Patient presents with   Urinary Incontinence    HPI: I was consulted to assist the patient as urinary incontinence.  She has urge incontinence.  No stress incontinence.  Mild bedwetting.  Wears 4 pads a day damp to moderately wet  Voids every 2 hours and has no nocturia due to incontinence  Has had a stroke.  No hysterectomy.  Takes Lasix .  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   Heart murmur    Hypercholesterolemia    Hypertension    Lymphadenitis    Renal disorder    Skin cancer    Spontaneous ecchymoses    Vitamin D  deficiency     Surgical History: Past Surgical History:  Procedure Laterality Date   CARPAL TUNNEL RELEASE     [facility]   CATARACT EXTRACTION W/PHACO Left 01/21/2022   Procedure: CATARACT EXTRACTION PHACO AND INTRAOCULAR LENS PLACEMENT (IOC) LEFT;  Surgeon: Myrna Adine Anes, MD;  Location: [facility];  Service: Ophthalmology;  Laterality: Left;  12.93 1:12.8   CATARACT EXTRACTION W/PHACO Right 02/04/2022   Procedure: CATARACT EXTRACTION PHACO AND INTRAOCULAR LENS PLACEMENT (IOC) RIGHT 8.34 00:50.9;  Surgeon: Myrna Adine Anes, MD;  Location: [facility];  Service: Ophthalmology;  Laterality: Right;   ESOPHAGOGASTRODUODENOSCOPY (EGD) WITH PROPOFOL  N/A 04/14/2020   Procedure: ESOPHAGOGASTRODUODENOSCOPY (EGD) WITH PROPOFOL ;  Surgeon: Jinny Carmine, MD;  Location: [facility];  Service: Endoscopy;  Laterality: N/A;   HIP ARTHROPLASTY Left 11/18/2020   Procedure: ARTHROPLASTY BIPOLAR  HIP (HEMIARTHROPLASTY);  Surgeon: Edie Norleen PARAS, MD;  Location: [facility];  Service: Orthopedics;  Laterality: Left;   INTRAMEDULLARY (IM) NAIL INTERTROCHANTERIC Right 09/24/2023   Procedure: INTRAMEDULLARY (IM) NAIL INTERTROCHANTERIC;  Surgeon: Lorelle Hussar, MD;  Location: [facility];  Service: Orthopedics;  Laterality: Right;   TONSILLECTOMY     TRANSTHORACIC ECHOCARDIOGRAM  05/01/2015    EF 60-65%. Abnormal relaxation. Mild aortic stenosis (peak gradient 19 mmHg).    Home Medications:  Allergies as of 08/02/2024       Reactions   Influenza Vaccines Anaphylaxis   Xarelto [rivaroxaban] Other (See Comments)   Severe rectal bleeding per patient    Statins Other (See Comments)   Other Reaction: CNS DISORDER   Chlorthalidone  Diarrhea        Medication List        Accurate as of August 02, 2024  2:09 PM. If you have any questions, ask your nurse or doctor.          amLODipine  5 MG tablet Commonly known as: NORVASC  Take 5 mg by mouth 2 (two) times daily.   aspirin  81 MG chewable tablet Chew 81 mg by mouth daily.   Calcium  High Potency/Vitamin D  600-5 MG-MCG Tabs Generic drug: Calcium  Carb-Cholecalciferol  Take by mouth.   CALCIUM -VITAMIN D  PO Take by mouth daily.   clonazePAM  0.5 MG tablet Commonly known as: KLONOPIN  Take 1 tablet (0.5 mg total) by mouth 2 (two) times daily as needed for up to 1 day for anxiety.   cloNIDine  0.1  MG tablet Commonly known as: CATAPRES  Take 0.1 mg by mouth 2 (two) times daily as needed.   cyanocobalamin  1000 MCG tablet Take 1 tablet (1,000 mcg total) by mouth daily.   enoxaparin  40 MG/0.4ML injection Commonly known as: LOVENOX  Inject 0.4 mLs (40 mg total) into the skin daily.   ezetimibe 10 MG tablet Commonly known as: ZETIA Take 10 mg by mouth daily.   furosemide  20 MG tablet Commonly known as: LASIX  Take by mouth.   HYDROcodone -acetaminophen  5-325 MG tablet Commonly known as: NORCO/VICODIN Take 1 tablet by mouth every 4  (four) hours as needed for moderate pain (pain score 4-6).   losartan  100 MG tablet Commonly known as: COZAAR  Take 100 mg by mouth daily.   predniSONE  10 MG tablet Commonly known as: DELTASONE  Take 1 tablet (10 mg total) by mouth daily.        Allergies:  Allergies  Allergen Reactions   Influenza Vaccines Anaphylaxis   Xarelto [Rivaroxaban] Other (See Comments)    Severe rectal bleeding per patient    Statins Other (See Comments)    Other Reaction: CNS DISORDER   Chlorthalidone  Diarrhea    Family History: Family History  Problem Relation Age of Onset   Heart disease Father    Heart failure Father     Social History:  reports that she has never smoked. She has never used smokeless tobacco. She reports that she does not drink alcohol and does not use drugs.  ROS:                                        Physical Exam: BP 124/80 (BP Location: Left Arm, Patient Position: Sitting, Cuff Size: Normal)   Pulse 88   Ht 4' 11 (1.499 m)   BMI 30.28 kg/m   Constitutional:  Alert and oriented, No acute distress. HEENT: [place] AT, moist mucus membranes.  Trachea midline, no masses.   Laboratory Data: Lab Results  Component Value Date   WBC 20.2 (H) 09/29/2023   HGB 10.7 (L) 09/29/2023   HCT 31.6 (L) 09/29/2023   MCV 87.1 09/29/2023   PLT 175 09/29/2023    Lab Results  Component Value Date   CREATININE 1.04 (H) 09/29/2023    No results found for: PSA  No results found for: TESTOSTERONE  Lab Results  Component Value Date   HGBA1C 5.9 (H) 03/17/2020    Urinalysis    Component Value Date/Time   COLORURINE YELLOW (A) 09/26/2023 1100   APPEARANCEUR CLEAR (A) 09/26/2023 1100   LABSPEC 1.013 09/26/2023 1100   PHURINE 6.0 09/26/2023 1100   GLUCOSEU NEGATIVE 09/26/2023 1100   HGBUR NEGATIVE 09/26/2023 1100   BILIRUBINUR NEGATIVE 09/26/2023 1100   KETONESUR NEGATIVE 09/26/2023 1100   PROTEINUR NEGATIVE 09/26/2023 1100   NITRITE NEGATIVE  09/26/2023 1100   LEUKOCYTESUR NEGATIVE 09/26/2023 1100    Pertinent Imaging: No urine  Assessment & Plan: Patient clinically has an overactive bladder.  Reassess in 6 weeks on pelvic examination and cystoscopy with Gemtesa  samples and prescription.  Get urine sample done.  Check postvoid residual then.  She does have significant leg edema especially on the left leg.  1. Urinary incontinence, unspecified type (Primary)    No follow-ups on file.  Glendia DELENA Elizabeth, MD  [facility] [address], [address] [place], [place] [address] [phone]

## 2024-08-02 NOTE — Addendum Note (Signed)
 Addended by: Favio Moder E on: 08/02/2024 02:38 PM   Modules accepted: Orders

## 2024-08-04 ENCOUNTER — Telehealth: Payer: Self-pay

## 2024-08-04 NOTE — Telephone Encounter (Signed)
 Patient called in with c/o of constipation. She was wondering if it could possibly be from starting Gemtessa 2 days ago. I let her know that constipation is not a common side effect for that med but suggested if she took some miralax  amybe that would help her. Patient understood suggestion.

## 2024-08-06 ENCOUNTER — Telehealth: Payer: Self-pay

## 2024-08-06 NOTE — Telephone Encounter (Signed)
 Patient called in with complaints that she thinks the new gemtesa  Rx is causing her to be nauseated. After talking with patient we aren't sure if this is the issue. Pt took only 2 doses of gemtesa  but has been dealing with bad constipation before this. Pt stated she took miralax  and nausea started after that. She took one dose of miralax  and has had 1 very small BM since then. I explained that the constipation might be what is making her feel bad right now as she says she feels very unwell besides the nausea. Pt also had complaints of severe swelling in lower extremities and is having weeping. I instructed her that she needs to follow up asap with her primary care or cardiologist to manage her water retention issues. I then instructed patient to up her fiber intake and drink plenty of fluids to help with the constipation. I also let her know that we will let Dr. Franklyn know of her concern with the medication.

## 2024-09-02 ENCOUNTER — Other Ambulatory Visit: Payer: Self-pay | Admitting: Internal Medicine

## 2024-09-02 DIAGNOSIS — R600 Localized edema: Secondary | ICD-10-CM

## 2024-09-06 ENCOUNTER — Ambulatory Visit: Attending: Internal Medicine

## 2024-09-20 ENCOUNTER — Encounter: Payer: Self-pay | Admitting: Oncology

## 2024-10-25 ENCOUNTER — Ambulatory Visit: Admitting: Urology

## 2024-10-25 ENCOUNTER — Telehealth (INDEPENDENT_AMBULATORY_CARE_PROVIDER_SITE_OTHER): Payer: Self-pay

## 2024-10-25 ENCOUNTER — Ambulatory Visit (INDEPENDENT_AMBULATORY_CARE_PROVIDER_SITE_OTHER): Admitting: Vascular Surgery

## 2024-10-25 ENCOUNTER — Encounter (INDEPENDENT_AMBULATORY_CARE_PROVIDER_SITE_OTHER): Payer: Self-pay | Admitting: Vascular Surgery

## 2024-10-25 VITALS — BP 143/85 | HR 97 | Resp 16 | Ht 59.0 in | Wt 124.0 lb

## 2024-10-25 VITALS — BP 131/86 | HR 96

## 2024-10-25 DIAGNOSIS — I890 Lymphedema, not elsewhere classified: Secondary | ICD-10-CM | POA: Diagnosis not present

## 2024-10-25 DIAGNOSIS — N3941 Urge incontinence: Secondary | ICD-10-CM | POA: Diagnosis not present

## 2024-10-25 DIAGNOSIS — E66812 Obesity, class 2: Secondary | ICD-10-CM | POA: Diagnosis not present

## 2024-10-25 DIAGNOSIS — I10 Essential (primary) hypertension: Secondary | ICD-10-CM | POA: Diagnosis not present

## 2024-10-25 DIAGNOSIS — Z6835 Body mass index (BMI) 35.0-35.9, adult: Secondary | ICD-10-CM

## 2024-10-25 DIAGNOSIS — E6609 Other obesity due to excess calories: Secondary | ICD-10-CM

## 2024-10-25 DIAGNOSIS — N184 Chronic kidney disease, stage 4 (severe): Secondary | ICD-10-CM

## 2024-10-25 DIAGNOSIS — I129 Hypertensive chronic kidney disease with stage 1 through stage 4 chronic kidney disease, or unspecified chronic kidney disease: Secondary | ICD-10-CM

## 2024-10-25 LAB — MICROSCOPIC EXAMINATION
RBC, Urine: >30 /HPF — AB (ref 0–2)
WBC, UA: >30 /HPF — AB (ref 0–5)

## 2024-10-25 LAB — URINALYSIS, COMPLETE
Bilirubin, UA: NEGATIVE
Glucose, UA: NEGATIVE
Ketones, UA: NEGATIVE
Nitrite, UA: NEGATIVE
Protein,UA: NEGATIVE
Specific Gravity, UA: 1.015 (ref 1.005–1.030)
Urobilinogen, Ur: 0.2 mg/dL (ref 0.2–1.0)
pH, UA: 5.5 (ref 5.0–7.5)

## 2024-10-25 MED ORDER — TRIMETHOPRIM 100 MG PO TABS: 100.0000 mg | ORAL_TABLET | Freq: Every day | ORAL | 11 refills | Status: DC

## 2024-10-25 MED ORDER — CIPROFLOXACIN HCL 250 MG PO TABS: 250.0000 mg | ORAL_TABLET | Freq: Two times a day (BID) | ORAL | 0 refills | Status: DC

## 2024-10-25 MED ORDER — LIDOCAINE HCL URETHRAL/MUCOSAL 2 % EX GEL
1.0000 | Freq: Once | CUTANEOUS | Status: AC
Start: 2024-10-25 — End: 2024-10-25
  Administered 2024-10-25: 1 via URETHRAL

## 2024-10-25 NOTE — Addendum Note (Signed)
 Addended byBETHA CORIE PLATER on: 10/25/2024 04:29 PM   Modules accepted: Orders

## 2024-10-25 NOTE — Progress Notes (Signed)
 10/25/2024 2:27 PM   Linda Bradley 1941/10/14 [id number]  Referring provider: Derick Leita POUR, MD [address] [place],  [place] [address]  Chief Complaint  Patient presents with   Cysto    HPI: I was consulted to assist the patient as urinary incontinence.  She has urge incontinence.  No stress incontinence.  Mild bedwetting.  Wears 4 pads a day damp to moderately wet   Voids every 2 hours and has no nocturia due to incontinence   Has had a stroke.  No hysterectomy.  Takes Lasix .  The patient her son both think that the leaking got worse after her stroke when she had hip issues   Generally uses a walker at home    Patient clinically has an overactive bladder. Reassess in 6 weeks on pelvic examination and cystoscopy with Gemtesa  samples and prescription. Get urine sample done. Check postvoid residual then. She does have significant leg edema especially on the left leg.   Today Patient got nausea day 2 on Gemtesa .  Still has incontinence.  She says she does not normally get a lot of bladder infections She just had her legs wrapped up stairs and had little bit of bleeding left foot through her Ace bandage.  Nurses gave her good care a little bit of compression.  I think she is fine to go home with this Pelvic exam: Due to immobility and deep vaginal vault and well supported urethra was more challenging to negotiate.  No stress incontinence or prolapse.  She had little bit of bleeding after cystoscopy from urethral irritation at the level of the meatus and this was discussed.  Patient was given a pad Patient underwent flexible cystoscopy.  Urine was very cloudy.  She a lot of white flecks.  Mild bladder erythema but it was hard to see.  Clinically the findings are in keeping with cystitis and not carcinoma.  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   Heart murmur    Hypercholesterolemia    Hypertension    Lymphadenitis    Renal disorder    Skin cancer    Spontaneous ecchymoses    Vitamin D  deficiency     Surgical History: Past Surgical History:  Procedure Laterality Date   CARPAL TUNNEL RELEASE     [facility]   CATARACT EXTRACTION W/PHACO Left 01/21/2022   Procedure: CATARACT EXTRACTION PHACO AND INTRAOCULAR LENS PLACEMENT (IOC) LEFT;  Surgeon: Myrna Adine Anes, MD;  Location: [facility];  Service: Ophthalmology;  Laterality: Left;  12.93 1:12.8   CATARACT EXTRACTION W/PHACO Right 02/04/2022   Procedure: CATARACT EXTRACTION PHACO AND INTRAOCULAR LENS PLACEMENT (IOC) RIGHT 8.34 00:50.9;  Surgeon: Myrna Adine Anes, MD;  Location: [facility];  Service: Ophthalmology;  Laterality: Right;   ESOPHAGOGASTRODUODENOSCOPY (EGD) WITH PROPOFOL  N/A 04/14/2020   Procedure: ESOPHAGOGASTRODUODENOSCOPY (EGD) WITH PROPOFOL ;  Surgeon: Jinny Carmine, MD;  Location: [facility];  Service: Endoscopy;  Laterality: N/A;   HIP ARTHROPLASTY Left 11/18/2020   Procedure: ARTHROPLASTY BIPOLAR HIP (HEMIARTHROPLASTY);  Surgeon: Edie Norleen PARAS, MD;  Location: [facility];  Service: Orthopedics;  Laterality: Left;   INTRAMEDULLARY (IM) NAIL INTERTROCHANTERIC Right 09/24/2023   Procedure: INTRAMEDULLARY (IM) NAIL INTERTROCHANTERIC;  Surgeon: Lorelle Hussar, MD;  Location: [facility];  Service: Orthopedics;  Laterality: Right;   TONSILLECTOMY  TRANSTHORACIC ECHOCARDIOGRAM  05/01/2015    EF 60-65%. Abnormal relaxation. Mild aortic stenosis (peak gradient 19 mmHg).    Home Medications:  Allergies as of 10/25/2024       Reactions   Influenza Vaccines Anaphylaxis   Xarelto [rivaroxaban] Other (See Comments)   Severe rectal bleeding per patient    Statins Other (See Comments)   Other Reaction: CNS DISORDER   Chlorthalidone  Diarrhea        Medication List        Accurate as of October 25, 2024  2:27 PM. If you have any questions, ask your nurse or doctor.          amLODipine  5 MG tablet Commonly known as: NORVASC  Take 5 mg by mouth 2 (two) times daily.   aspirin  81 MG chewable tablet Chew 81 mg by mouth daily.   Calcium  High Potency/Vitamin D  600-5 MG-MCG Tabs Generic drug: Calcium  Carb-Cholecalciferol  Take by mouth.   CALCIUM -VITAMIN D  PO Take by mouth daily.   carvedilol  12.5 MG tablet Commonly known as: COREG  Take 12.5 mg by mouth 2 (two) times daily with a meal.   clonazePAM  0.5 MG tablet Commonly known as: KLONOPIN  Take 1 tablet (0.5 mg total) by mouth 2 (two) times daily as needed for up to 1 day for anxiety.   cloNIDine  0.1 MG tablet Commonly known as: CATAPRES  Take 0.1 mg by mouth 2 (two) times daily as needed.   cyanocobalamin  1000 MCG tablet Take 1 tablet (1,000 mcg total) by mouth daily.   enoxaparin  40 MG/0.4ML injection Commonly known as: LOVENOX  Inject 0.4 mLs (40 mg total) into the skin daily.   ezetimibe 10 MG tablet Commonly known as: ZETIA Take 10 mg by mouth daily.   furosemide  20 MG tablet Commonly known as: LASIX  Take by mouth.   Gemtesa  75 MG Tabs Generic drug: Vibegron  Take 1 tablet (75 mg total) by mouth daily.   Gemtesa  75 MG Tabs Generic drug: Vibegron  Take 1 tablet (75 mg total) by mouth daily.   hydrALAZINE  100 MG tablet Commonly known as: APRESOLINE  Take 100 mg by mouth 2 (two) times daily.   HYDROcodone -acetaminophen  5-325 MG tablet Commonly known as: NORCO/VICODIN Take 1 tablet by mouth every 4 (four) hours as needed for moderate pain (pain score 4-6).   losartan  100 MG tablet Commonly known as: COZAAR  Take 100 mg by mouth daily.   oxybutynin 10 MG 24 hr tablet Commonly known as: DITROPAN-XL Take 10 mg by mouth at bedtime.   predniSONE  10 MG tablet Commonly known as: DELTASONE  Take 1 tablet (10 mg total) by mouth daily.        Allergies:  Allergies  Allergen Reactions   Influenza Vaccines  Anaphylaxis   Xarelto [Rivaroxaban] Other (See Comments)    Severe rectal bleeding per patient    Statins Other (See Comments)    Other Reaction: CNS DISORDER   Chlorthalidone  Diarrhea    Family History: Family History  Problem Relation Age of Onset   Heart disease Father    Heart failure Father     Social History:  reports that she has never smoked. She has never used smokeless tobacco. She reports that she does not drink alcohol and does not use drugs.  ROS:                                        Physical Exam: There were no vitals  taken for this visit.  Constitutional:  Alert and oriented, No acute distress.   Laboratory Data: Lab Results  Component Value Date   WBC 20.2 (H) 09/29/2023   HGB 10.7 (L) 09/29/2023   HCT 31.6 (L) 09/29/2023   MCV 87.1 09/29/2023   PLT 175 09/29/2023    Lab Results  Component Value Date   CREATININE 1.04 (H) 09/29/2023    No results found for: PSA  No results found for: TESTOSTERONE  Lab Results  Component Value Date   HGBA1C 5.9 (H) 03/17/2020    Urinalysis    Component Value Date/Time   COLORURINE YELLOW (A) 09/26/2023 1100   APPEARANCEUR CLEAR (A) 09/26/2023 1100   LABSPEC 1.013 09/26/2023 1100   PHURINE 6.0 09/26/2023 1100   GLUCOSEU NEGATIVE 09/26/2023 1100   HGBUR NEGATIVE 09/26/2023 1100   BILIRUBINUR NEGATIVE 09/26/2023 1100   KETONESUR NEGATIVE 09/26/2023 1100   PROTEINUR NEGATIVE 09/26/2023 1100   NITRITE NEGATIVE 09/26/2023 1100   LEUKOCYTESUR NEGATIVE 09/26/2023 1100    Pertinent Imaging:   Assessment & Plan: I will treat the patient with ciprofloxacin 250 mg twice a day for 7 days.  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  I want to put her on trimethoprim  100 mg 3 x 11 states she  down regulates.  1. Urgency incontinence (Primary)  - Urinalysis, Complete   No follow-ups on file.  Glendia DELENA Elizabeth, MD  [facility] [address] [place], [place] [address] [phone]

## 2024-10-25 NOTE — Telephone Encounter (Signed)
 Referral was sent to [facility] home health for weekly unna wraps. The referral has been accepted and patient son has been notified.

## 2024-10-25 NOTE — Addendum Note (Signed)
 Addended byBETHA CORIE PLATER on: 10/25/2024 03:27 PM   Modules accepted: Orders

## 2024-10-25 NOTE — Progress Notes (Unsigned)
 Subjective:    Patient ID: Linda Bradley, female    DOB: 01-Jul-1941, 83 y.o.   MRN: [id number] Chief Complaint  Patient presents with  . New Patient (Initial Visit)    consult. bilateral edema. callwood, dwayne.  More in the left leg per pt     Linda Bradley is an 83 yo female who presents to clinic today     Review of Systems  Constitutional: Negative.   Cardiovascular:  Positive for leg swelling.  Musculoskeletal:  Positive for myalgias.  All other systems reviewed and are negative.      Objective:   Physical Exam Vitals reviewed.  Constitutional:      Appearance: Normal appearance. She is obese.  HENT:     Head: Normocephalic.  Eyes:     Pupils: Pupils are equal, round, and reactive to light.  Cardiovascular:     Rate and Rhythm: Normal rate and regular rhythm.     Pulses: Normal pulses.     Heart sounds: Normal heart sounds.  Pulmonary:     Effort: Pulmonary effort is normal.     Breath sounds: Normal breath sounds.  Abdominal:     General: Bowel sounds are normal.     Palpations: Abdomen is soft.  Musculoskeletal:        General: Swelling present.     Right lower leg: Edema present.     Left lower leg: Edema present.  Skin:    General: Skin is warm and dry.     Capillary Refill: Capillary refill takes 2 to 3 seconds.  Neurological:     General: No focal deficit present.     Mental Status: She is alert and oriented to person, place, and time. Mental status is at baseline.  Psychiatric:        Mood and Affect: Mood normal.        Behavior: Behavior normal.        Thought Content: Thought content normal.        Judgment: Judgment normal.     BP (!) 143/85   Pulse 97   Resp 16   Ht 4' 11 (1.499 m)   Wt 124 lb (56.2 kg)   BMI 25.04 kg/m   Past Medical History:  Diagnosis Date  . Anxiety disorder   . Dermatitis   . 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 Heart murmur   . Hypercholesterolemia   . Hypertension   . Lymphadenitis    . Renal disorder   . Skin cancer   . Spontaneous ecchymoses   . Vitamin D  deficiency     Social History   Socioeconomic History  . Marital status: Widowed    Spouse name: Not on file  . Number of children: Not on file  . Years of education: Not on file  . Highest education level: Not on file  Occupational History  . Not on file  Tobacco Use  . Smoking status: Never  . Smokeless tobacco: Never  Vaping Use  . Vaping status: Never Used  Substance and Sexual Activity  . Alcohol use: No  . Drug use: No  . Sexual activity: Not on file  Other Topics Concern  . Not on file  Social History Narrative  . Not on file   Social Drivers of Health   Financial Resource Strain: Medium Risk (06/09/2023)   Received from [facility]   Overall Financial Resource Strain (CARDIA)   . Difficulty of Paying Living Expenses: Somewhat hard  Food Insecurity: No Food Insecurity (09/24/2023)   Hunger Vital Sign   . Worried About Programme Researcher, Broadcasting/film/video in the Last Year: Never true   . Ran Out of Food in the Last Year: Never true  Transportation Needs: No Transportation Needs (09/24/2023)   PRAPARE - Transportation   . Lack of Transportation (Medical): No   . Lack of Transportation (Non-Medical): No  Physical Activity: Not on file  Stress: Not on file  Social Connections: Not on file  Intimate Partner Violence: Not At Risk (09/24/2023)   Humiliation, Afraid, Rape, and Kick questionnaire   . Fear of Current or Ex-Partner: No   . Emotionally Abused: No   . Physically Abused: No   . Sexually Abused: No    Past Surgical History:  Procedure Laterality Date  . CARPAL TUNNEL RELEASE     [facility]  . CATARACT EXTRACTION W/PHACO Left 01/21/2022   Procedure: CATARACT EXTRACTION PHACO AND INTRAOCULAR LENS PLACEMENT (IOC) LEFT;  Surgeon: Myrna Adine Anes, MD;  Location: [facility];  Service: Ophthalmology;  Laterality: Left;  12.93 1:12.8  . CATARACT EXTRACTION W/PHACO Right  02/04/2022   Procedure: CATARACT EXTRACTION PHACO AND INTRAOCULAR LENS PLACEMENT (IOC) RIGHT 8.34 00:50.9;  Surgeon: Myrna Adine Anes, MD;  Location: [facility];  Service: Ophthalmology;  Laterality: Right;  . ESOPHAGOGASTRODUODENOSCOPY (EGD) WITH PROPOFOL  N/A 04/14/2020   Procedure: ESOPHAGOGASTRODUODENOSCOPY (EGD) WITH PROPOFOL ;  Surgeon: Jinny Carmine, MD;  Location: [facility];  Service: Endoscopy;  Laterality: N/A;  . HIP ARTHROPLASTY Left 11/18/2020   Procedure: ARTHROPLASTY BIPOLAR HIP (HEMIARTHROPLASTY);  Surgeon: Edie Norleen PARAS, MD;  Location: [facility];  Service: Orthopedics;  Laterality: Left;  . INTRAMEDULLARY (IM) NAIL INTERTROCHANTERIC Right 09/24/2023   Procedure: INTRAMEDULLARY (IM) NAIL INTERTROCHANTERIC;  Surgeon: Lorelle Hussar, MD;  Location: [facility];  Service: Orthopedics;  Laterality: Right;  . TONSILLECTOMY    . TRANSTHORACIC ECHOCARDIOGRAM  05/01/2015    EF 60-65%. Abnormal relaxation. Mild aortic stenosis (peak gradient 19 mmHg).    Family History  Problem Relation Age of Onset  . Heart disease Father   . Heart failure Father     Allergies  Allergen Reactions  . Influenza Vaccines Anaphylaxis  . Xarelto [Rivaroxaban] Other (See Comments)    Severe rectal bleeding per patient   . Statins Other (See Comments)    Other Reaction: CNS DISORDER  . Chlorthalidone  Diarrhea       Latest Ref Rng & Units 09/29/2023    4:51 AM 09/28/2023    4:25 AM 09/27/2023    4:45 AM  CBC  WBC 4.0 - 10.5 K/uL 20.2  19.8  20.1   Hemoglobin 12.0 - 15.0 g/dL 89.2  88.6  88.8   Hematocrit 36.0 - 46.0 % 31.6  34.0  33.0   Platelets 150 - 400 K/uL 175  176  166       CMP     Component Value Date/Time   NA 135 09/29/2023 0451   K 4.2 09/29/2023 0451   CL 101 09/29/2023 0451   CO2 26 09/29/2023 0451   GLUCOSE 117 (H) 09/29/2023 0451   BUN 45 (H) 09/29/2023 0451   CREATININE 1.04 (H) 09/29/2023 0451   CALCIUM  8.3 (L) 09/29/2023 0451   CALCIUM  8.3 (L) 03/19/2020  0557   PROT 5.9 (L) 09/24/2023 0327   ALBUMIN 3.2 (L) 09/24/2023 0327   AST 17 09/24/2023 0327   ALT 31 09/24/2023 0327   ALKPHOS 91 09/24/2023 0327   BILITOT 1.1 09/24/2023 0327   GFRNONAA  54 (L) 09/29/2023 0451     No results found.     Assessment & Plan:   1. Lymphedema (Primary) Recommend:  I have had a long discussion with the patient regarding swelling and why it  causes symptoms.  Patient will begin wearing graduated compression on a daily basis a prescription was given. The patient will  wear the stockings first thing in the morning and removing them in the evening. The patient is instructed specifically not to sleep in the stockings.   In addition, behavioral modification will be initiated.  This will include frequent elevation, use of over the counter pain medications and exercise such as walking.  Consideration for a lymph pump will also be made based upon the effectiveness of conservative therapy.  This would help to improve the edema control and prevent sequela such as ulcers and infections   Patient should undergo duplex ultrasound of the venous system to ensure that DVT or reflux is not present.  The patient will follow-up with me after the ultrasound.   2. Essential hypertension Continue antihypertensive medications as already ordered, these medications have been reviewed and there are no changes at this time.  3. CKD stage 4 secondary to hypertension (HCC) ***  4. Class 2 obesity due to excess calories without serious comorbidity with body mass index (BMI) of 35.0 to 35.9 in adult ***   Current Outpatient Medications on File Prior to Visit  Medication Sig Dispense Refill  . amLODipine  (NORVASC ) 5 MG tablet Take 5 mg by mouth 2 (two) times daily.    . aspirin  81 MG chewable tablet Chew 81 mg by mouth daily.    . clonazePAM  (KLONOPIN ) 0.5 MG tablet Take 1 tablet (0.5 mg total) by mouth 2 (two) times daily as needed for up to 1 day for anxiety. 2 tablet 0  .  ezetimibe (ZETIA) 10 MG tablet Take 10 mg by mouth daily.    . hydrALAZINE  (APRESOLINE ) 100 MG tablet Take 100 mg by mouth 2 (two) times daily.    . losartan  (COZAAR ) 100 MG tablet Take 100 mg by mouth daily.    . predniSONE  (DELTASONE ) 10 MG tablet Take 1 tablet (10 mg total) by mouth daily. 42 tablet 0  . vitamin B-12 1000 MCG tablet Take 1 tablet (1,000 mcg total) by mouth daily. 30 tablet 0  . Calcium  Carb-Cholecalciferol  (CALCIUM  HIGH POTENCY/VITAMIN D ) 600-5 MG-MCG TABS Take by mouth. (Patient not taking: Reported on 08/02/2024)    . CALCIUM -VITAMIN D  PO Take by mouth daily. (Patient not taking: Reported on 08/02/2024)    . carvedilol  (COREG ) 12.5 MG tablet Take 12.5 mg by mouth 2 (two) times daily with a meal.    . cloNIDine  (CATAPRES ) 0.1 MG tablet Take 0.1 mg by mouth 2 (two) times daily as needed. (Patient not taking: Reported on 10/25/2024)    . enoxaparin  (LOVENOX ) 40 MG/0.4ML injection Inject 0.4 mLs (40 mg total) into the skin daily. (Patient not taking: Reported on 10/25/2024) 5.6 mL 0  . furosemide  (LASIX ) 20 MG tablet Take by mouth. (Patient not taking: Reported on 09/24/2023)    . HYDROcodone -acetaminophen  (NORCO/VICODIN) 5-325 MG tablet Take 1 tablet by mouth every 4 (four) hours as needed for moderate pain (pain score 4-6). (Patient not taking: Reported on 10/25/2024) 30 tablet 0  . oxybutynin (DITROPAN-XL) 10 MG 24 hr tablet Take 10 mg by mouth at bedtime. (Patient not taking: Reported on 10/25/2024)    . Vibegron  (GEMTESA ) 75 MG TABS Take 1 tablet (75 mg  total) by mouth daily. (Patient not taking: Reported on 10/25/2024) 30 tablet 11  . Vibegron  (GEMTESA ) 75 MG TABS Take 1 tablet (75 mg total) by mouth daily. (Patient not taking: Reported on 10/25/2024) 28 tablet 0   No current facility-administered medications on file prior to visit.    There are no Patient Instructions on file for this visit. No follow-ups on file.   Gwendlyn JONELLE Shank, NP

## 2024-10-28 ENCOUNTER — Telehealth (INDEPENDENT_AMBULATORY_CARE_PROVIDER_SITE_OTHER): Payer: Self-pay

## 2024-10-28 LAB — CULTURE, URINE COMPREHENSIVE

## 2024-10-28 NOTE — Telephone Encounter (Signed)
 Debbie with [facility] left a message requesting for approval to start weekly unna wraps with start date 11/01/24.I returned a call and notified that start date is fine

## 2024-11-01 ENCOUNTER — Telehealth (INDEPENDENT_AMBULATORY_CARE_PROVIDER_SITE_OTHER): Payer: Self-pay

## 2024-11-01 NOTE — Telephone Encounter (Signed)
 Patient called back in reference to home health coming out today, she wanted to know a time and how many patients the nurse had today. I called patient back at this time and informed her that we are unsure how many patients are on the nurses schedule today, but we have notified home health to let them know to notify her before they arrive so someone can let them in.

## 2024-11-01 NOTE — Telephone Encounter (Signed)
 Patient called stating she has not heard from [facility] about coming out today she is unable to open the front door and has someone there with her that can do so but she needs some advance time if they need to step out. Call to [facility] to verify that she is on the schedule today they have been advised and will call patient prior to arrival. Patient advised.

## 2024-11-01 NOTE — Telephone Encounter (Signed)
 Patient reach out to the office checking if the home health nurse was still scheduled to come out today. Patient was given [facility] home health office number to reach to check the time frame of the nurse arrival time.

## 2024-11-02 NOTE — Telephone Encounter (Signed)
 Cecelia RN Home Health nurse called to report that patient has swelling on the right leg and foot wants to adjust order for wraps she reports 3x3 cm blister on medial right foot heel that has not ruptured, on top of foot appears to also have a blister. She reports skin breakdown and discoloration of blue and/purple. She reports she would like to do wound care 2x a wk for 6-8 wks with kerlix and gauze until healed and then proceed with unna wraps. Please advise

## 2024-11-03 NOTE — Telephone Encounter (Signed)
 That's fine, but she will need some compression to help these wounds, so they may need to place a tubi grip over the kerlix

## 2024-11-03 NOTE — Telephone Encounter (Signed)
 Per below call to Cecelia, she states she applied a light compression of coban over the kerlix.

## 2024-11-12 ENCOUNTER — Telehealth (INDEPENDENT_AMBULATORY_CARE_PROVIDER_SITE_OTHER): Payer: Self-pay

## 2024-11-12 NOTE — Telephone Encounter (Signed)
 Cecilia home health nurse called and stated patient had a blister on the top of her R foot that has popped and a blister on the top of her L foot that is still intact/full of fluid and discolored, looks almost like a blood blister. She stated she is continuing to wrap her legs in normal kerlix with a light coban over top, she is just concerned about the blisters at this time. Please advise.

## 2024-11-12 NOTE — Telephone Encounter (Signed)
 Nurse Ted answered the phone and veralized understanding of adding xeroform over the blisters on bilateral feet under the kerlix followed with coban on top. I did let her know we were trying to move patients appointment up with [facility] and we would notify patient next week if her appointment changes.

## 2024-11-12 NOTE — Telephone Encounter (Signed)
 I did also make cecilia aware to ensure patient is elevating at this time.

## 2024-11-12 NOTE — Telephone Encounter (Signed)
 I would advise to continue wraps but to use a xeroform over the blister areas (even if it hasn't popped).  Patient should be advised to elevate.  We can see if there is time for her to see brien sooner

## 2024-11-29 ENCOUNTER — Telehealth (INDEPENDENT_AMBULATORY_CARE_PROVIDER_SITE_OTHER): Payer: Self-pay

## 2024-11-29 ENCOUNTER — Ambulatory Visit (INDEPENDENT_AMBULATORY_CARE_PROVIDER_SITE_OTHER): Admitting: Vascular Surgery

## 2024-11-29 NOTE — Telephone Encounter (Signed)
 PT Wes Renyolds from [facility] called at this time in reference to verbal PT orders for patient at this time. He was requesting PT 2x a week x3 weeks and 1x a week x2 weeks for patient. I called to give verbal order at this time and reached his voicemail, left a message at this time for him to call back for orders.

## 2024-11-30 NOTE — Telephone Encounter (Signed)
 Left message on voicemail for Wes confirming patients PT order at this time.

## 2024-12-23 DEATH — deceased

## 2025-01-10 ENCOUNTER — Ambulatory Visit: Admitting: Urology
